# Patient Record
Sex: Female | Born: 1945
Health system: Southern US, Community
[De-identification: ages and names within clinical notes are randomized; demographics above are authoritative.]

## PROBLEM LIST (undated history)

## (undated) DIAGNOSIS — I499 Cardiac arrhythmia, unspecified: Secondary | ICD-10-CM

## (undated) DIAGNOSIS — E785 Hyperlipidemia, unspecified: Secondary | ICD-10-CM

## (undated) DIAGNOSIS — R9389 Abnormal findings on diagnostic imaging of other specified body structures: Secondary | ICD-10-CM

## (undated) DIAGNOSIS — I10 Essential (primary) hypertension: Secondary | ICD-10-CM

## (undated) DIAGNOSIS — Z87442 Personal history of urinary calculi: Secondary | ICD-10-CM

## (undated) DIAGNOSIS — K219 Gastro-esophageal reflux disease without esophagitis: Secondary | ICD-10-CM

## (undated) DIAGNOSIS — R112 Nausea with vomiting, unspecified: Secondary | ICD-10-CM

## (undated) DIAGNOSIS — Z9889 Other specified postprocedural states: Secondary | ICD-10-CM

## (undated) DIAGNOSIS — I4891 Unspecified atrial fibrillation: Secondary | ICD-10-CM

## (undated) DIAGNOSIS — J189 Pneumonia, unspecified organism: Secondary | ICD-10-CM

## (undated) DIAGNOSIS — N8500 Endometrial hyperplasia, unspecified: Secondary | ICD-10-CM

## (undated) HISTORY — DX: Hyperlipidemia, unspecified: E78.5

## (undated) HISTORY — DX: Unspecified atrial fibrillation: I48.91

## (undated) HISTORY — DX: Essential (primary) hypertension: I10

## (undated) HISTORY — PX: LAPAROSCOPY: SHX197

## (undated) HISTORY — PX: EYE SURGERY: SHX253

## (undated) HISTORY — PX: ANKLE FRACTURE SURGERY: SHX122

## (undated) HISTORY — PX: TONSILLECTOMY AND ADENOIDECTOMY: SHX28

---

## 2001-05-17 ENCOUNTER — Other Ambulatory Visit: Admission: RE | Admit: 2001-05-17 | Discharge: 2001-05-17 | Payer: Self-pay | Admitting: Obstetrics and Gynecology

## 2002-07-23 ENCOUNTER — Other Ambulatory Visit: Admission: RE | Admit: 2002-07-23 | Discharge: 2002-07-23 | Payer: Self-pay | Admitting: Gynecology

## 2003-07-19 ENCOUNTER — Other Ambulatory Visit: Admission: RE | Admit: 2003-07-19 | Discharge: 2003-07-19 | Payer: Self-pay | Admitting: Gynecology

## 2004-10-14 ENCOUNTER — Other Ambulatory Visit: Admission: RE | Admit: 2004-10-14 | Discharge: 2004-10-14 | Payer: Self-pay | Admitting: Gynecology

## 2005-03-28 ENCOUNTER — Emergency Department (HOSPITAL_COMMUNITY): Admission: EM | Admit: 2005-03-28 | Discharge: 2005-03-29 | Payer: Self-pay | Admitting: Emergency Medicine

## 2006-01-25 ENCOUNTER — Other Ambulatory Visit: Admission: RE | Admit: 2006-01-25 | Discharge: 2006-01-25 | Payer: Self-pay | Admitting: Gynecology

## 2007-02-22 ENCOUNTER — Other Ambulatory Visit: Admission: RE | Admit: 2007-02-22 | Discharge: 2007-02-22 | Payer: Self-pay | Admitting: Gynecology

## 2007-02-27 ENCOUNTER — Ambulatory Visit (HOSPITAL_COMMUNITY): Admission: RE | Admit: 2007-02-27 | Discharge: 2007-02-27 | Payer: Self-pay | Admitting: Gynecology

## 2007-03-30 ENCOUNTER — Encounter: Admission: RE | Admit: 2007-03-30 | Discharge: 2007-03-30 | Payer: Self-pay | Admitting: Family Medicine

## 2008-12-04 ENCOUNTER — Emergency Department (HOSPITAL_COMMUNITY): Admission: EM | Admit: 2008-12-04 | Discharge: 2008-12-04 | Payer: Self-pay | Admitting: Emergency Medicine

## 2010-07-30 ENCOUNTER — Encounter (INDEPENDENT_AMBULATORY_CARE_PROVIDER_SITE_OTHER): Payer: Self-pay | Admitting: *Deleted

## 2010-09-11 ENCOUNTER — Encounter (INDEPENDENT_AMBULATORY_CARE_PROVIDER_SITE_OTHER): Payer: Self-pay | Admitting: *Deleted

## 2010-09-11 ENCOUNTER — Ambulatory Visit: Payer: Self-pay | Admitting: Gastroenterology

## 2010-09-11 DIAGNOSIS — R197 Diarrhea, unspecified: Secondary | ICD-10-CM | POA: Insufficient documentation

## 2010-09-11 DIAGNOSIS — K219 Gastro-esophageal reflux disease without esophagitis: Secondary | ICD-10-CM | POA: Insufficient documentation

## 2010-09-11 DIAGNOSIS — K5900 Constipation, unspecified: Secondary | ICD-10-CM | POA: Insufficient documentation

## 2010-09-11 LAB — CONVERTED CEMR LAB: IgA: 479 mg/dL — ABNORMAL HIGH (ref 68–378)

## 2010-09-15 LAB — CONVERTED CEMR LAB: Tissue Transglutaminase Ab, IgA: 3.3 units (ref <20)

## 2010-11-01 HISTORY — PX: DILATION AND CURETTAGE OF UTERUS: SHX78

## 2010-11-09 ENCOUNTER — Ambulatory Visit
Admission: RE | Admit: 2010-11-09 | Discharge: 2010-11-09 | Payer: Self-pay | Source: Home / Self Care | Attending: Gastroenterology | Admitting: Gastroenterology

## 2010-11-09 ENCOUNTER — Encounter: Payer: Self-pay | Admitting: Gastroenterology

## 2010-12-03 NOTE — Assessment & Plan Note (Signed)
History of Present Illness Primary GI MD: Rob Bunting MD Primary Provider: Lesle Chris, MD Chief Complaint: After meals pt has a lot of epigastric pain, belching and bloating. Also alternating constipation and diarrhea.  History of Present Illness:      very pleasant 65 year old woman who has very noisy abdomen.  VEry loud.  No discomfort, just noisy. She does belch alot.  She has rare pryrosis.  upper abd symptoms worse with NSAIDs , bisphosphonates.    Sympotoms worse after evening meal.  Gets bloated.  Gas ex helps a slight bit.  Tried pepcid, no difference.  Overall has gained 10 pounds in a year.  Alternates constipation/diarrhea chronically.  SHe never sees blood in stool. She has done FOB testing, 2 years ago was negative.  Never had colonoscopy.  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           Current Medications (verified): 1)  Vivelle-Dot 0.05 Mg/24hr Pttw (Estradiol) .... As Directed Every 3 Days 2)  Prometrium 200 Mg Caps (Progesterone Micronized) .... One Tablet By Mouth Every 3 Months For 12 Days  Allergies (verified): No Known Drug Allergies  Past History:  Past Medical History: Arthritis Elevated cholesterol  Past Surgical History: none  Family History: daughter has celiac sprue  Social History: she is widowed, she has 2 daughters, she works at urgent care as a Solicitor, she does not smoke cigarettes, she does not drink alcohol, she drinks 2-3 caffeinated beverages a day  Review of Systems       Pertinent positive and negative review of systems were noted in the above HPI and GI specific review of systems.  All other review of systems was otherwise negative.   Vital Signs:  Patient profile:   65 year old female Height:      64 inches Weight:      145.50 pounds BMI:     25.07 Pulse rate:   88 / minute Pulse rhythm:   regular BP sitting:   144 / 72  (left arm) Cuff size:   regular  Vitals Entered By: Christie Nottingham CMA Duncan Dull) (September 11, 2010 10:08  AM)  Physical Exam  Additional Exam:  Constitutional: generally well appearing Psychiatric: alert and oriented times 3 Eyes: extraocular movements intact Mouth: oropharynx moist, no lesions Neck: supple, no lymphadenopathy Cardiovascular: heart regular rate and rythm Lungs: CTA bilaterally Abdomen: soft, non-tender, non-distended, no obvious ascites, no peritoneal signs, normal bowel sounds Extremities: no lower extremity edema bilaterally Skin: no lesions on visible extremities    Impression & Recommendations:  Problem # 1:  Alternating constipation, diarrhea likely functional. She has never had colonoscopy for routine cancer screening and we will set that up for her at her soonest convenience.  Perhaps some of these symptoms are sprue related  Problem # 2:  family history celiac sprue blood testing today and if positive would recommend EGD  Problem # 3:  belching, dyspepsia trial of proton pump inhibitor once nightly. She will call with a response in 4-5 weeks.  Other Orders: TLB-IgA (Immunoglobulin A) (82784-IGA) T-Sprue Panel (Celiac Disease Aby Eval) (83516x3/[address]) Colonoscopy (Colon)  Patient Instructions: 1)  You will be scheduled to have a colonoscopy. 2)  You will get lab test(s) done today (celiac panel, total IgA).  would recommend EGD, biopsy if positive. 3)  Samples of PPI given, take one pill 20-30 min prior to dinner meal. 4)  Call Dr. Christella Hartigan' office in 4-5 weeks to report on response to medicine.   5)  A  copy of this information will be sent to Dr. Cleta Alberts. 6)  The medication list was reviewed and reconciled.  All changed / newly prescribed medications were explained.  A complete medication list was provided to the patient / caregiver. Prescriptions: MOVIPREP 100 GM  SOLR (PEG-KCL-NACL-NASULF-NA ASC-C) As per prep instructions.  #1 x 0   Entered by:   Chales Abrahams CMA (AAMA)   Authorized by:   Rachael Fee MD   Signed by:   Chales Abrahams CMA (AAMA) on  09/11/2010   Method used:   Electronically to        [facility] (retail)       [address]       [place], [place]  [address]       Ph: [phone] or [phone]       Fax: [phone]   RxID:   [id number]

## 2010-12-03 NOTE — Letter (Signed)
Summary: New Patient letter  [facility]  [address] [place], [place] [address]   Phone: [phone]  Fax: [phone]       07/30/2010 MRN: [id number]  Mary Potter [address] [place], [place]  [address]  Dear Ms. Mary Potter,  Welcome to the Gastroenterology Division at [facility].    You are scheduled to see Dr.  Christella Potter on 09-11-10 at 10:00a.m. on the 3rd floor at [facility], [address]  We ask that you try to arrive at our office 15 minutes prior to your appointment time to allow for check-in.  We would like you to complete the enclosed self-administered evaluation form prior to your visit and bring it with you on the day of your appointment.  We will review it with you.  Also, please bring a complete list of all your medications or, if you prefer, bring the medication bottles and we will list them.  Please bring your insurance card so that we may make a copy of it.  If your insurance requires a referral to see a specialist, please bring your referral form from your primary care physician.  Co-payments are due at the time of your visit and may be paid by cash, check or credit card.     Your office visit will consist of a consult with your physician (includes a physical exam), any laboratory testing he/she may order, scheduling of any necessary diagnostic testing (e.g. x-ray, ultrasound, CT-scan), and scheduling of a procedure (e.g. Endoscopy, Colonoscopy) if required.  Please allow enough time on your schedule to allow for any/all of these possibilities.    If you cannot keep your appointment, please call [phone] to cancel or reschedule prior to your appointment date.  This allows [place] the opportunity to schedule an appointment for another patient in need of care.  If you do not cancel or reschedule by 5 p.m. the business day prior to your appointment date, you will be charged a $50.00 late cancellation/no-show fee.    Thank you for choosing  [facility] for your medical needs.  We appreciate the opportunity to care for you.  Please visit [place] at our website  to learn more about our practice.                     Sincerely,                                                             The Gastroenterology Division

## 2010-12-03 NOTE — Letter (Signed)
Summary: [facility] Instructions  [facility]  [address] [place], [place] [address]   Phone: [phone]  Fax: [phone]       Mary Potter    [birth year]    MRN: [id number]        Procedure Day /Date: 11/09/10 MON     Arrival Time:10 am     Procedure Time:11 am     Location of Procedure:                    X  [facility] (4th Floor)                        PREPARATION FOR COLONOSCOPY WITH MOVIPREP   Starting 5 days prior to your procedure 11/04/10  do not eat nuts, seeds, popcorn, corn, beans, peas,  salads, or any raw vegetables.  Do not take any fiber supplements (e.g. Metamucil, Citrucel, and Benefiber).  THE DAY BEFORE YOUR PROCEDURE         DATE: 11/08/10  DAY: SUN  1.  Drink clear liquids the entire day-NO SOLID FOOD  2.  Do not drink anything colored red or purple.  Avoid juices with pulp.  No orange juice.  3.  Drink at least 64 oz. (8 glasses) of fluid/clear liquids during the day to prevent dehydration and help the prep work efficiently.  CLEAR LIQUIDS INCLUDE: Water Jello Ice Popsicles Tea (sugar ok, no milk/cream) Powdered fruit flavored drinks Coffee (sugar ok, no milk/cream) Gatorade Juice: apple, white grape, white cranberry  Lemonade Clear bullion, consomm, broth Carbonated beverages (any kind) Strained chicken noodle soup Hard Candy                             4.  In the morning, mix first dose of MoviPrep solution:    Empty 1 Pouch A and 1 Pouch B into the disposable container    Add lukewarm drinking water to the top line of the container. Mix to dissolve    Refrigerate (mixed solution should be used within 24 hrs)  5.  Begin drinking the prep at 5:00 p.m. The MoviPrep container is divided by 4 marks.   Every 15 minutes drink the solution down to the next mark (approximately 8 oz) until the full liter is complete.   6.  Follow completed prep with 16 oz of clear liquid of your choice (Nothing red or purple).   Continue to drink clear liquids until bedtime.  7.  Before going to bed, mix second dose of MoviPrep solution:    Empty 1 Pouch A and 1 Pouch B into the disposable container    Add lukewarm drinking water to the top line of the container. Mix to dissolve    Refrigerate  THE DAY OF YOUR PROCEDURE      DATE:11/09/10  DAY: MON  Beginning at 6 a.m. (5 hours before procedure):         1. Every 15 minutes, drink the solution down to the next mark (approx 8 oz) until the full liter is complete.  2. Follow completed prep with 16 oz. of clear liquid of your choice.    3. You may drink clear liquids until 9 am (2 HOURS BEFORE PROCEDURE).   MEDICATION INSTRUCTIONS  Unless otherwise instructed, you should take regular prescription medications with a small sip of water   as early as possible the morning of your procedure.  OTHER INSTRUCTIONS  You will need a responsible adult at least 64 years of age to accompany you and drive you home.   This person must remain in the waiting room during your procedure.  Wear loose fitting clothing that is easily removed.  Leave jewelry and other valuables at home.  However, you may wish to bring a book to read or  an iPod/MP3 player to listen to music as you wait for your procedure to start.  Remove all body piercing jewelry and leave at home.  Total time from sign-in until discharge is approximately 2-3 hours.  You should go home directly after your procedure and rest.  You can resume normal activities the  day after your procedure.  The day of your procedure you should not:   Drive   Make legal decisions   Operate machinery   Drink alcohol   Return to work  You will receive specific instructions about eating, activities and medications before you leave.    The above instructions have been reviewed and explained to me by   _______________________    I fully understand and can verbalize these instructions  _____________________________ Date _________

## 2010-12-03 NOTE — Procedures (Signed)
Summary: Colonoscopy  Patient: Mary Potter Note: All result statuses are Final unless otherwise noted.  Tests: (1) Colonoscopy (COL)   COL Colonoscopy           DONE     [facility]     [address]     [place], [place]  [address]           COLONOSCOPY PROCEDURE REPORT           PATIENT:  Mary Potter, Mary Potter  MR#:  [id number]     BIRTHDATE:  October 19, 1946, 64 yrs. old  GENDER:  female     ENDOSCOPIST:  Rachael Fee, MD     REF. BY:  Lesle Chris, M.D.     PROCEDURE DATE:  11/09/2010     PROCEDURE:  Diagnostic Colonoscopy     ASA CLASS:  Class II     INDICATIONS:  dyspepsia, abd discomforts, alternating     constipation/diarrhea     MEDICATIONS:   Fentanyl 50 mcg IV, Versed 7 mg IV           DESCRIPTION OF PROCEDURE:   After the risks benefits and     alternatives of the procedure were thoroughly explained, informed     consent was obtained.  Digital rectal exam was performed and     revealed no rectal masses.   The LB [id number]-[id number] endoscope     was introduced through the anus and advanced to the cecum, which     was identified by both the appendix and ileocecal valve, without     limitations.  The quality of the prep was good, using MoviPrep.     The instrument was then slowly withdrawn as the colon was fully     examined.     <<PROCEDUREIMAGES>>     FINDINGS:  Mild diverticulosis was found in the sigmoid to     descending colon segments (see image3).  This was otherwise a     normal examination of the colon (see image1, image2, and image5).     Retroflexed views in the rectum revealed no abnormalities.    The     scope was then withdrawn from the patient and the procedure     completed.     COMPLICATIONS:  None           ENDOSCOPIC IMPRESSION:     1) Mild diverticulosis in the sigmoid to descending colon     segments     2) Otherwise normal examination; no cancers or polyps           RECOMMENDATIONS:     1) You should continue to follow colorectal  cancer screening     guidelines for "routine risk" patients with a repeat colonoscopy     in 10 years. There is no need for FOBT (stool) testing for at     least 5 years.     2) You should take 2 gas-ex pills with every meal           ______________________________     Rachael Fee, MD           n.     eSIGNED:   Rachael Fee at 11/09/2010 11:33 AM           Burnard Leigh, [phone]  Note: An exclamation mark (!) indicates a result that was not dispersed into the flowsheet. Document Creation Date: 11/09/2010 11:33 AM _______________________________________________________________________  (1) Order result status: Final Collection or observation date-time:  11/09/2010 11:28 Requested date-time:  Receipt date-time:  Reported date-time:  Referring Physician:   Ordering Physician: Rob Bunting [phone]) Specimen Source:  Source: Launa Grill Order Number: [id number] Lab site:   Appended Document: Colonoscopy    Clinical Lists Changes  Observations: Added new observation of COLONNXTDUE: 11/2020 (11/09/2010 11:37)

## 2011-05-11 ENCOUNTER — Ambulatory Visit (HOSPITAL_BASED_OUTPATIENT_CLINIC_OR_DEPARTMENT_OTHER)
Admission: RE | Admit: 2011-05-11 | Discharge: 2011-05-11 | Disposition: A | Discharge status: Home / Self Care | Payer: 59 | Source: Ambulatory Visit | Attending: Gynecology | Admitting: Gynecology

## 2011-05-11 ENCOUNTER — Other Ambulatory Visit: Payer: Self-pay | Admitting: Gynecology

## 2011-05-11 DIAGNOSIS — D280 Benign neoplasm of vulva: Secondary | ICD-10-CM | POA: Insufficient documentation

## 2011-05-11 DIAGNOSIS — N840 Polyp of corpus uteri: Secondary | ICD-10-CM | POA: Insufficient documentation

## 2011-05-11 DIAGNOSIS — N950 Postmenopausal bleeding: Secondary | ICD-10-CM | POA: Insufficient documentation

## 2011-05-11 DIAGNOSIS — I4510 Unspecified right bundle-branch block: Secondary | ICD-10-CM | POA: Insufficient documentation

## 2011-05-11 DIAGNOSIS — Z01812 Encounter for preprocedural laboratory examination: Secondary | ICD-10-CM | POA: Insufficient documentation

## 2011-05-11 DIAGNOSIS — Z01810 Encounter for preprocedural cardiovascular examination: Secondary | ICD-10-CM | POA: Insufficient documentation

## 2011-05-11 LAB — POCT HEMOGLOBIN-HEMACUE: Hemoglobin: 13.6 g/dL (ref 12.0–15.0)

## 2011-06-10 NOTE — Op Note (Signed)
  Mary Potter, Mary Potter             ACCOUNT NO.:  [id number]  MEDICAL RECORD NO.:  [id number]  LOCATION:                                 FACILITY:  PHYSICIAN:  Gretta Cool, M.D. DATE OF BIRTH:  1946-07-20  DATE OF PROCEDURE:  05/11/2011 DATE OF DISCHARGE:                              OPERATIVE REPORT   PREOPERATIVE DIAGNOSES: 1. Endometrial polyps with postmenopausal bleeding. 2. Atypical nevus, right vulva.  POSTOPERATIVE DIAGNOSES: 1. Endometrial polyps with postmenopausal bleeding. 2. Atypical nevus, right vulva.  PROCEDURE: 1. Hysteroscopy resection of the multiple endometrial polyps, fundal     benign in appearance. 2. Full-thickness excision of atypical nevus, right vulva.  SURGEON:  Gretta Cool, MD  ANESTHESIA:  IV sedation and paracervical block.  DESCRIPTION OF PROCEDURE:  Under excellent anesthesia as above with the patient prepped and draped in Allen stirrups, her bladder was drained. Weighted speculum was then placed in the vagina and paracervical block applied.  The cervix was then progressively dilated with series of Pratt dilators to 33.  The endometrial cavity was then examined and photographed.  Multiple polyps were noted in the fundus of the uterus. The polyps were progressively resected down 5 mm or so into the myometrium.  The corneal areas where they appeared to arise were resected until no further endometrial tissue was identified.  At this point, the bleeding was well-controlled at reduced pressure.  Toradol was given intraoperatively.  At this point, attention was turned to the vulvar nevus.  There was a slightly raised atypical looking nevus with irregular border and variation in color.  Because of its position and atypical appearance and fairly large size, it was excised full-thickness with a narrow margin and around the closest margins, an elliptical incision was made and closed with 2 interrupted sutures of 3-0 black silk.  At this  point, procedure was terminated without complication.  Patient returned to recovery room in excellent condition.         ______________________________ Gretta Cool, M.D.    CWL/MEDQ  D:  05/11/2011  T:  05/11/2011  Job:  [id number]  Electronically Signed by Beather Arbour M.D. on 06/10/2011 09:12:10 AM

## 2011-11-12 ENCOUNTER — Other Ambulatory Visit: Payer: Self-pay | Admitting: Gynecology

## 2012-02-16 DIAGNOSIS — L819 Disorder of pigmentation, unspecified: Secondary | ICD-10-CM | POA: Diagnosis not present

## 2012-02-16 DIAGNOSIS — L821 Other seborrheic keratosis: Secondary | ICD-10-CM | POA: Diagnosis not present

## 2012-04-07 ENCOUNTER — Other Ambulatory Visit: Payer: Self-pay | Admitting: Gynecology

## 2012-04-07 DIAGNOSIS — R7989 Other specified abnormal findings of blood chemistry: Secondary | ICD-10-CM | POA: Diagnosis not present

## 2012-04-07 DIAGNOSIS — M949 Disorder of cartilage, unspecified: Secondary | ICD-10-CM | POA: Diagnosis not present

## 2012-04-07 DIAGNOSIS — M899 Disorder of bone, unspecified: Secondary | ICD-10-CM | POA: Diagnosis not present

## 2012-04-07 DIAGNOSIS — I10 Essential (primary) hypertension: Secondary | ICD-10-CM | POA: Diagnosis not present

## 2012-04-07 DIAGNOSIS — E7800 Pure hypercholesterolemia, unspecified: Secondary | ICD-10-CM | POA: Diagnosis not present

## 2012-05-24 ENCOUNTER — Encounter: Payer: Self-pay | Admitting: Emergency Medicine

## 2012-11-14 ENCOUNTER — Ambulatory Visit (INDEPENDENT_AMBULATORY_CARE_PROVIDER_SITE_OTHER): Payer: 59 | Admitting: Internal Medicine

## 2012-11-14 VITALS — BP 150/70 | HR 97 | Temp 97.7°F | Resp 16 | Ht 64.5 in | Wt 137.0 lb

## 2012-11-14 DIAGNOSIS — H5710 Ocular pain, unspecified eye: Secondary | ICD-10-CM

## 2012-11-14 DIAGNOSIS — S00251A Superficial foreign body of right eyelid and periocular area, initial encounter: Secondary | ICD-10-CM

## 2012-11-14 DIAGNOSIS — E789 Disorder of lipoprotein metabolism, unspecified: Secondary | ICD-10-CM | POA: Insufficient documentation

## 2012-11-14 DIAGNOSIS — T1510XA Foreign body in conjunctival sac, unspecified eye, initial encounter: Secondary | ICD-10-CM

## 2012-11-14 DIAGNOSIS — H5711 Ocular pain, right eye: Secondary | ICD-10-CM

## 2012-11-14 NOTE — Patient Instructions (Signed)
Eye, Foreign Body The term foreign body refers to any object near, on the surface of or in the eye that should not be there. A foreign body may be a small speck of dirt or dust, a hair or eyelash, a splinter or any object. CAUSES  Foreign bodies can get in the eye by:  Flying pieces of something that was broken or destroyed (debris).  A sudden injury (trauma) to the eye. SYMPTOMS  Symptoms depend on what the foreign body is and where it is in the eye. The most common locations are:  On the inner surface of the upper or lower eyelids or on the covering of the white part of the eye (conjunctiva). Symptoms in this location are:  Irritating and painful, especially when blinking.  Feeling like something is in the eye.  On the surface of the clear covering on the front of the eye (cornea). A corneal foreign body has symptoms that:  Are painful and irritating since the cornea is very sensitive.  Form small "rust rings" around a metallic foreign body. Metallic foreign bodies stick more firmly to the surface of the cornea.  Inside the eyeball. Infection can happen fast and can be hard to treat with antibiotics. This is an extremely dangerous situation. Foreign bodies inside the eye can threaten vision. A person may even loose their eye. Foreign bodies inside the eye may cause:  Great pain.  Immediate loss of vision. DIAGNOSIS  Foreign bodies are found during an exam by an eye specialist. Those that are on the eyelids, conjunctiva or cornea are usually (but not always) easily found. When a foreign body is inside the eyeball, a cataract may form almost right away. This makes it hard for an ophthalmologist to find the foreign body. Special tests may be needed, including ultrasound testing, X-rays and CT scans. TREATMENT   Foreign bodies that are on the eyelids, conjunctiva or cornea are often removed easily and painlessly.  If the foreign body has caused a scratch or abrasion of the cornea,  antibiotic drops, ointments and/or a tight patch called a "pressure patch" may be needed. Follow-up exams will be needed for several days until the abrasion heals.  Surgery is needed right away if the foreign body is inside the eyeball. This is a medical emergency. An antibiotic therapy will likely be given to stop an infection. HOME CARE INSTRUCTIONS  The use of eye patches is not universal. Their use varies from state to state and from caregiver to caregiver. If an eye patch was applied:  Keep the eye patch on for as long as directed by your caregiver until the follow-up appointment.  Do not remove the patch to put in medications unless instructed to do so. When replacing the patch, retape it as it was before. Follow the same procedure if the patch becomes loose.  WARNING: Do not drive or operate machinery while the eye is patched. The ability to judge distances will be impaired.  Only take over-the-counter or prescription medicines for pain, discomfort or fever as directed by the caregiver. If no eye patch was applied:  Keep the eye closed as much as possible. Do not rub the eye.  Wear dark glasses as needed to protect the eyes from bright light.  Do not wear contact lenses until the eye feels normal again, or as instructed.  Wear protective eye covering if there is a risk of eye injury. This is important when working with high speed tools.  Only take over-the-counter or   feels normal again, or as instructed.   Wear protective eye covering if there is a risk of eye injury. This is important when working with high speed tools.   Only take over-the-counter or prescription medicines for pain, discomfort or fever as directed by the caregiver.  SEEK IMMEDIATE MEDICAL CARE IF:    Pain increases in the eye or the vision changes.   You or your child has problems with the eye patch.   The injury to the eye appears to be getting larger.   There is discharge from the injured eye.   Swelling and/or soreness (inflammation) develops around the affected eye.   You or your child has an oral temperature above 102 F (38.9 C), not controlled by medicine.   Your baby is older than 3  months with a rectal temperature of 102 F (38.9 C) or higher.   Your baby is 3 months old or younger with a rectal temperature of 100.4 F (38 C) or higher.  MAKE SURE YOU:    Understand these instructions.   Will watch your condition.   Will get help right away if you are not doing well or get worse.  Document Released: 10/18/2005 Document Revised: 01/10/2012 Document Reviewed: 06/06/2008  Exit[employer] Patient Information 2013 [employer].

## 2012-11-14 NOTE — Progress Notes (Signed)
  Subjective:    Patient ID: Mary Potter, female    DOB: 10/18/1946, 67 y.o.   MRN: [id number]  HPI Putting on make up and something fell into the eye, right. Feels something, did not poke anything into eye.   Review of Systems     Objective:   Physical Exam  Constitutional: She is oriented to person, place, and time. She appears well-developed and well-nourished.  Eyes: Conjunctivae normal and EOM are normal. Pupils are equal, round, and reactive to light. Right eye exhibits no discharge. Foreign body present in the right eye. Left eye exhibits no discharge. No foreign body present in the left eye. Right conjunctiva is not injected. Right conjunctiva has no hemorrhage.       No corneal uptake  Neurological: She is alert and oriented to person, place, and time. She exhibits normal muscle tone. Coordination normal.  Psychiatric: She has a normal mood and affect.          Assessment & Plan:  FBs removed from conjunctiva

## 2013-06-22 ENCOUNTER — Telehealth: Payer: Self-pay

## 2013-06-22 NOTE — Telephone Encounter (Signed)
I do not see any drops listed in her chart.  Could try OTC allergy eye drop.  Otherwise would recommend pt come in and be seen

## 2013-06-22 NOTE — Telephone Encounter (Signed)
Mary Potter called and stated that she woke up this morning with red eyes and swollen stuck together lids. She reported that she started using a new make-up and this allergic reaction often happens when she changes any make-up products. Maralyn Sago had called in some drops for her the last time this happened and she is requesting again if possible. I could not find a record of the type of drops that were called in last time.

## 2013-06-25 NOTE — Telephone Encounter (Signed)
Patient advised.

## 2013-11-15 DIAGNOSIS — Z01419 Encounter for gynecological examination (general) (routine) without abnormal findings: Secondary | ICD-10-CM | POA: Diagnosis not present

## 2013-11-15 DIAGNOSIS — N951 Menopausal and female climacteric states: Secondary | ICD-10-CM | POA: Diagnosis not present

## 2013-11-15 DIAGNOSIS — Z0000 Encounter for general adult medical examination without abnormal findings: Secondary | ICD-10-CM | POA: Diagnosis not present

## 2013-11-15 DIAGNOSIS — M810 Age-related osteoporosis without current pathological fracture: Secondary | ICD-10-CM | POA: Diagnosis not present

## 2013-11-15 DIAGNOSIS — Z79890 Hormone replacement therapy (postmenopausal): Secondary | ICD-10-CM | POA: Diagnosis not present

## 2013-12-04 DIAGNOSIS — Z1231 Encounter for screening mammogram for malignant neoplasm of breast: Secondary | ICD-10-CM | POA: Diagnosis not present

## 2013-12-04 DIAGNOSIS — R928 Other abnormal and inconclusive findings on diagnostic imaging of breast: Secondary | ICD-10-CM | POA: Diagnosis not present

## 2013-12-11 DIAGNOSIS — R928 Other abnormal and inconclusive findings on diagnostic imaging of breast: Secondary | ICD-10-CM | POA: Diagnosis not present

## 2013-12-11 DIAGNOSIS — Z1231 Encounter for screening mammogram for malignant neoplasm of breast: Secondary | ICD-10-CM | POA: Diagnosis not present

## 2014-01-08 ENCOUNTER — Ambulatory Visit (INDEPENDENT_AMBULATORY_CARE_PROVIDER_SITE_OTHER): Payer: 59 | Admitting: Internal Medicine

## 2014-01-08 ENCOUNTER — Ambulatory Visit: Payer: 59

## 2014-01-08 VITALS — BP 166/80 | HR 76 | Temp 98.4°F | Resp 16 | Ht 63.0 in | Wt 139.2 lb

## 2014-01-08 DIAGNOSIS — M79673 Pain in unspecified foot: Secondary | ICD-10-CM

## 2014-01-08 DIAGNOSIS — M79609 Pain in unspecified limb: Secondary | ICD-10-CM

## 2014-01-08 NOTE — Progress Notes (Signed)
   Subjective:    Patient ID: Mary Potter, female    DOB: 10-07-1946, 68 y.o.   MRN: [id number]  HPI    Review of Systems     Objective:   Physical Exam        Assessment & Plan:

## 2014-01-08 NOTE — Progress Notes (Signed)
   Subjective:    Patient ID: Mary Potter, female    DOB: Jul 08, 1946, 68 y.o.   MRN: [id number]  HPI 68 year old female present for left foot pain. Pt has had it for one week. The pain is dorsal and right around the toes. No known injury. She had the same issue previously; and they thought it may have been related to a tendon.  Has past hx of stress fxs, no proven hx of gout. 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 Foot orthopedist wants to replace her right ankle joint in future.  Review of Systems     Objective:   Physical Exam  Constitutional: She appears well-developed and well-nourished. No distress.  HENT:  Head: Normocephalic.  Eyes: EOM are normal.  Pulmonary/Chest: Effort normal.  Musculoskeletal:       Left foot: She exhibits tenderness, bony tenderness and swelling. She exhibits normal range of motion, normal capillary refill, no crepitus, no deformity and no laceration.       Feet:  Resolving eccymosis  Neurological: She has normal strength. No sensory deficit. Gait abnormal.   [facility] reading (PRIMARY) by  Dr Elder Cyphers 4th distal MT has callus   BP 140/80      Assessment & Plan:  Left distal foot pain/Probable stress fx healing Camwalker

## 2014-01-09 LAB — COMPREHENSIVE METABOLIC PANEL
ALT: 20 U/L (ref 0–35)
AST: 21 U/L (ref 0–37)
Albumin: 4.5 g/dL (ref 3.5–5.2)
Alkaline Phosphatase: 41 U/L (ref 39–117)
BUN: 17 mg/dL (ref 6–23)
CO2: 25 mEq/L (ref 19–32)
Calcium: 9 mg/dL (ref 8.4–10.5)
Chloride: 103 mEq/L (ref 96–112)
Creat: 0.66 mg/dL (ref 0.50–1.10)
Glucose, Bld: 91 mg/dL (ref 70–99)
Potassium: 4.3 mEq/L (ref 3.5–5.3)
Sodium: 137 mEq/L (ref 135–145)
Total Bilirubin: 0.5 mg/dL (ref 0.2–1.2)
Total Protein: 6.8 g/dL (ref 6.0–8.3)

## 2014-01-09 LAB — URIC ACID: Uric Acid, Serum: 3.9 mg/dL (ref 2.4–7.0)

## 2014-04-17 ENCOUNTER — Other Ambulatory Visit (INDEPENDENT_AMBULATORY_CARE_PROVIDER_SITE_OTHER): Payer: 59

## 2014-04-17 ENCOUNTER — Encounter: Payer: Self-pay | Admitting: Family Medicine

## 2014-04-17 ENCOUNTER — Ambulatory Visit (INDEPENDENT_AMBULATORY_CARE_PROVIDER_SITE_OTHER): Payer: 59 | Admitting: Family Medicine

## 2014-04-17 VITALS — BP 136/84 | HR 93 | Ht 63.5 in | Wt 140.0 lb

## 2014-04-17 DIAGNOSIS — M79605 Pain in left leg: Secondary | ICD-10-CM

## 2014-04-17 DIAGNOSIS — M79609 Pain in unspecified limb: Secondary | ICD-10-CM | POA: Diagnosis not present

## 2014-04-17 LAB — VITAMIN D 25 HYDROXY (VIT D DEFICIENCY, FRACTURES): VITD: 51.93 ng/mL

## 2014-04-17 LAB — CK: Total CK: 35 U/L (ref 7–177)

## 2014-04-17 LAB — FERRITIN: Ferritin: 26.2 ng/mL (ref 10.0–291.0)

## 2014-04-17 MED ORDER — GABAPENTIN 100 MG PO CAPS: 100.0000 mg | ORAL_CAPSULE | Freq: Three times a day (TID) | ORAL | Status: DC

## 2014-04-17 NOTE — Assessment & Plan Note (Signed)
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 Patient on ultrasound and physical exam today did not show any signs of cast irritation. We decided that we are going to start gabapentin at a low dose and gave patient some exercise program. We also talked about manual massage to try to help with the potential for piriformis syndrome causing an L5 nerve root compression. Patient has a negative straight leg test and is in good shape so I doubt that patient's low back is contributing but if it continues I would consider x-rays of the lumbar spine.

## 2014-04-17 NOTE — Progress Notes (Signed)
[facility] [place], [place] [address] Phone: [phone] Subjective:    CC: left leg pain  ZWC:HENIDPOEUM Mary Potter is a 68 y.o. female coming in with complaint of left leg pain. Patient states she's had this pain for quite some time. 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  Even had significant workup including MRI without any specific findings. Patient states this one is somewhat similar but a little bit different. Patient describes it is more of a dull aching sensation that can have a burning sensation on the lateral aspect of her leg starting in her knee and going distally. Patient states it seems to be actually worse with sitting or even lying down. Patient states that the pain at is worse even at night. Patient states it wakes her up about 3-4 times a night. Patient denies any fevers or chills or any abnormal weight loss. Patient states that she has tried different things including stopping her cholesterol medication for a week which did seem to make some mild improvement but is back on the medication. Patient states that the severity is 8/10. Patient has been taking ibuprofen with moderate benefit as well.     Past medical history, social, surgical and family history all reviewed in electronic medical record.   Review of Systems: No headache, visual changes, nausea, vomiting, diarrhea, constipation, dizziness, abdominal pain, skin rash, fevers, chills, night sweats, weight loss, swollen lymph nodes, body aches, joint swelling, muscle aches, chest pain, shortness of breath, mood changes.   Objective Blood pressure 136/84, pulse 93, height 5' 3.5" (1.613 m), weight 140 lb (63.504 kg), SpO2 98.00%.  General: No apparent distress alert and oriented x3 mood and affect normal, dressed  appropriately.  HEENT: Pupils equal, extraocular movements intact  Respiratory: Patient's speak in full sentences and does not appear short of breath  Cardiovascular: No lower extremity edema, non tender, no erythema  Skin: Warm dry intact with no signs of infection or rash on extremities or on axial skeleton.  Abdomen: Soft nontender  Neuro: Cranial nerves II through XII are intact, neurovascularly intact in all extremities with 2+ DTRs and 2+ pulses.  Lymph: No lymphadenopathy of posterior or anterior cervical chain or axillae bilaterally.  Gait normal with good balance and coordination.  MSK:  Non tender with full range of motion and good stability and symmetric strength and tone of shoulders, elbows, wrist, hip,  and ankles bilaterally.  Back Exam:  Inspection: Unremarkable  Motion: Flexion 45 deg, Extension 45 deg, Side Bending to 45 deg bilaterally,  Rotation to 45 deg bilaterally  SLR laying: Negative  XSLR laying: Negative  Palpable tenderness: Mild tenderness over the greater trochanteric bursa as well as the performance muscle to FABER: Mild positive on left Sensory change: Gross sensation intact to all lumbar and sacral dermatomes.  Reflexes: 2+ at both patellar tendons, 2+ at achilles tendons, Babinski's downgoing.  Strength at foot  Plantar-flexion: 5/5 Dorsi-flexion: 5/5 Eversion: 5/5 Inversion: 5/5  Leg strength  Quad: 5/5 Hamstring: 5/5 Hip flexor: 5/5 Hip abductors: 5/5  Gait unremarkable.  Knee: Left Normal to inspection with no erythema or effusion or obvious bony abnormalities. Palpation normal with no warmth, joint line tenderness, patellar tenderness, or condyle tenderness. ROM full in flexion and extension and lower leg rotation. Ligaments with solid consistent endpoints  including ACL, PCL, LCL, MCL. Negative Mcmurray's, Apley's, and Thessalonian tests. Non painful patellar compression. Patellar glide without crepitus. Patellar and quadriceps tendons  unremarkable. Hamstring and quadriceps strength is normal.  Contralateral knee unremarkable  MSK US performed of: Left knee This study was ordered, performed, and interpreted by Charlann Boxer D.O.  Knee: All structures visualized. Anteromedial, anterolateral, posteromedial, and posterolateral menisci unremarkable without tearing, fraying, effusion, or displacement. Patellar Tendon unremarkable on long and transverse views without effusion. No abnormality of prepatellar bursa. LCL and MCL unremarkable on long and transverse views. No abnormality of origin of medial or lateral head of the gastrocnemius.  IMPRESSION:  NORMAL ULTRASONOGRAPHIC EXAMINATION OF THE KNEE.    Impression and Recommendations:     This case required medical decision making of moderate complexity.

## 2014-04-17 NOTE — Patient Instructions (Signed)
Very nice to meet you Exercises most days of the week.  Gabapentin at night  Start at 100mg  for the first 5 night and then go to 200mg  for 5 nights then 300mg  thereafter.  Tennis ball back left pocket with sitting.  Get labs drawn today. I will send you the results Iron 325mg  three times a week may help Come back in 3 weeks.

## 2014-05-09 ENCOUNTER — Other Ambulatory Visit (INDEPENDENT_AMBULATORY_CARE_PROVIDER_SITE_OTHER): Payer: 59

## 2014-05-09 ENCOUNTER — Encounter: Payer: Self-pay | Admitting: Family Medicine

## 2014-05-09 ENCOUNTER — Ambulatory Visit (INDEPENDENT_AMBULATORY_CARE_PROVIDER_SITE_OTHER): Payer: 59 | Admitting: Family Medicine

## 2014-05-09 VITALS — BP 126/82 | HR 72 | Ht 63.5 in | Wt 141.0 lb

## 2014-05-09 DIAGNOSIS — M25579 Pain in unspecified ankle and joints of unspecified foot: Secondary | ICD-10-CM

## 2014-05-09 DIAGNOSIS — M25571 Pain in right ankle and joints of right foot: Secondary | ICD-10-CM

## 2014-05-09 DIAGNOSIS — M79605 Pain in left leg: Secondary | ICD-10-CM

## 2014-05-09 DIAGNOSIS — M79609 Pain in unspecified limb: Secondary | ICD-10-CM

## 2014-05-09 DIAGNOSIS — M76829 Posterior tibial tendinitis, unspecified leg: Secondary | ICD-10-CM

## 2014-05-09 DIAGNOSIS — M76821 Posterior tibial tendinitis, right leg: Secondary | ICD-10-CM

## 2014-05-09 DIAGNOSIS — Z981 Arthrodesis status: Secondary | ICD-10-CM | POA: Insufficient documentation

## 2014-05-09 NOTE — Patient Instructions (Signed)
Good to see you Ice bath 20 minutes at end of long day Exercises 3 times a week Pennsaid twice daily until pain is gone.  COnitnue the vitamin D  Heel lift in shoe at all time.  Come back in 3 weeks if not better.  Posterior Tibial Tendon Tendinitis with Rehab Tendonitis is a condition that is characterized by inflammation of a tendon or the lining (sheath) that surrounds it. The inflammation is usually caused by damage to the tendon, such as a tendon tear (strain). Sprains are classified into three categories. Grade 1 sprains cause pain, but the tendon is not lengthened. Grade 2 sprains include a lengthened ligament due to the ligament being stretched or partially ruptured. With grade 2 sprains there is still function, although the function may be diminished. Grade 3 sprains are characterized by a complete tear of the tendon or muscle, and function is usually impaired. Posterior tibialis tendonitis is tendonitis of the posterior tibial tendon, which attaches muscles of the lower leg to the foot. The posterior tibial tendon is located in the back of the ankle and helps the body straighten (plantarflex) and rotate inward (medially rotate) the ankle. SYMPTOMS   Pain, tenderness, swelling, warmth, and/or redness over the back of the inner ankle at the posterior tibial tendon or the inner part of the mid-foot.  Pain that worsens with plantarflexion or medial rotation of the ankle.  A crackling sound (crepitation) when the tendon is moved or touched. CAUSES  Posterior tibial tendonitis occurs when damage to the posterior tibial tendon starts an inflammatory response. Common mechanisms of injury include:  Degenerative (occurs with aging) processes that weaken the tendon and make it more susceptible to injury.  Stress placed on the tendon from an increase in the intensity, frequency, or duration of training.  Direct trauma to the ankle.  Returning to activity before a previous ankle injury is  allowed to heal. RISK INCREASES WITH:  Activities that involve repetitive and/or stressful plantarflexion (jumping, kicking, or running up/down hills).  Poor strength and flexibility.  Flat feet.  Previous injury to the foot, ankle, or leg. PREVENTION   Warm up and stretch properly before activity.  Allow for adequate recovery between workouts.  Maintain physical fitness:  Strength, flexibility, and endurance.  Cardiovascular fitness.  Learn and use proper technique. When possible, have a coach correct improper technique.  Complete rehabilitation from a previous foot, ankle, or leg injury.  If you have flat feet, wear arch supports (orthotics). PROGNOSIS  If treated properly, then the symptoms of tendonitis usually resolve within 6 weeks. This period may be shorter for injuries caused by direct trauma. RELATED COMPLICATIONS   Prolonged healing time, if improperly treated or re-injured.  Recurrent symptoms that result in a chronic problem.  Partial or complete tendon tear (rupture) requiring surgery. TREATMENT  Treatment initially involves the use of ice and medication to help reduce pain and inflammation. The use of strengthening and stretching exercises may help reduce pain with activity. These exercises may be performed at home or with referral to a therapist. Often times, your caregiver will recommend immobilizing the ankle to allow the tendon to heal. If you have flat feet, the you may be advised to wear orthotic arch supports. If symptoms persist for greater than 6 months despite non-surgical (conservative) treatment, then surgery may be recommended. MEDICATION   If pain medication is necessary, then nonsteroidal anti-inflammatory medications, such as aspirin and ibuprofen, or other minor pain relievers, such as acetaminophen, are often recommended.  Do not take pain medication for 7 days before surgery.  Prescription pain relievers may be given if deemed necessary by  your caregiver. Use only as directed and only as much as you need.  Corticosteroid injections may be given by your caregiver. These injections should be reserved for the most serious cases, because they may only be given a certain number of times. HEAT AND COLD  Cold treatment (icing) relieves pain and reduces inflammation. Cold treatment should be applied for 10 to 15 minutes every 2 to 3 hours for inflammation and pain and immediately after any activity that aggravates your symptoms. Use ice packs or massage the area with a piece of ice (ice massage).  Heat treatment may be used prior to performing the stretching and strengthening activities prescribed by your caregiver, physical therapist, or athletic trainer. Use a heat pack or soak the injury in warm water. SEEK MEDICAL CARE IF:  Treatment seems to offer no benefit, or the condition worsens.  Any medications produce adverse side effects. EXERCISES RANGE OF MOTION (ROM) AND STRETCHING EXERCISES - Posterior Tibial Tendon Tendinitis These exercises may help you when beginning to rehabilitate your injury. Your symptoms may resolve with or without further involvement from your physician, physical therapist or athletic trainer. While completing these exercises, remember:   Restoring tissue flexibility helps normal motion to return to the joints. This allows healthier, less painful movement and activity.  An effective stretch should be held for at least 30 seconds.  A stretch should never be painful. You should only feel a gentle lengthening or release in the stretched tissue. RANGE OF MOTION - Ankle Plantar Flexion   Sit with your right / left leg crossed over your opposite knee.  Use your opposite hand to pull the top of your foot and toes toward you.  You should feel a gentle stretch on the top of your foot/ankle. Hold this position for __________ seconds. Repeat __________ times. Complete this exercise __________ times per day.  RANGE  OF MOTION - Ankle Eversion   Sit with your right / left ankle crossed over your opposite knee.  Grip your foot with your opposite hand, placing your thumb on the top of your foot and your fingers across the bottom of your foot.  Gently push your foot downward with a slight rotation so your littlest toes rise slightly  You should feel a gentle stretch on the inside of your ankle. Hold the stretch for __________ seconds. Repeat __________ times. Complete this exercise __________ times per day.  RANGE OF MOTION - Ankle Inversion   Sit with your right / left ankle crossed over your opposite knee.  Grip your foot with your opposite hand, placing your thumb on the bottom of your foot and your fingers across the top of your foot.  Gently pull your foot so the smallest toe comes toward you and your thumb pushes the inside of the ball of your foot away from you.  You should feel a gentle stretch on the outside of your ankle. Hold the stretch for __________ seconds. Repeat __________ times. Complete this exercise __________ times per day.  RANGE OF MOTION - Dorsi/Plantar Flexion  While sitting with your right / left knee straight, draw the top of your foot upwards by flexing your ankle. Then reverse the motion, pointing your toes downward.  Hold each position for __________ seconds.  After completing your first set of exercises, repeat this exercise with your knee bent. Repeat __________ times. Complete this exercise  __________ times per day.  RANGE OF MOTION - Ankle Alphabet  Imagine your right / left big toe is a pen.  Keeping your hip and knee still, write out the entire alphabet with your "pen." Make the letters as large as you can without increasing any discomfort. Repeat __________ times. Complete this exercise __________ times per day.  STRETCH - Gastrocsoleus   Sit with your right / left leg extended. Holding onto both ends of a belt or towel, loop it around the ball of your  foot.  Keeping your right / left ankle and foot relaxed and your knee straight, pull your foot and ankle toward you using the belt/towel.  You should feel a gentle stretch behind your calf or knee. Hold this position for __________ seconds. Repeat __________ times. Complete this exercise __________ times per day.  STRETCH - Gastroc, Standing   Place hands on wall.  Extend right / left leg, keeping the front knee somewhat bent.  Slightly point your toes inward on your back foot.  Keeping your right / left heel on the floor and your knee straight, shift your weight toward the wall, not allowing your back to arch.  You should feel a gentle stretch in the right / left calf. Hold this position for __________ seconds. Repeat __________ times. Complete this stretch __________ times per day. STRETCH - Soleus, Standing   Place hands on wall.  Extend right / left leg, keeping the other knee somewhat bent.  Slightly point your toes inward on your back foot.  Keep your right / left heel on the floor, bend your back knee, and slightly shift your weight over the back leg so that you feel a gentle stretch deep in your back calf.  Hold this position for __________ seconds. Repeat __________ times. Complete this stretch __________ times per day. STRENGTHENING EXERCISES - Posterior Tibial Tendon Tendinitis These exercises may help you when beginning to rehabilitate your injury. They may resolve your symptoms with or without further involvement from your physician, physical therapist or athletic trainer. While completing these exercises, remember:   Muscles can gain both the endurance and the strength needed for everyday activities through controlled exercises.  Complete these exercises as instructed by your physician, physical therapist or athletic trainer. Progress the resistance and repetitions only as guided. STRENGTH - Dorsiflexors  Secure a rubber exercise band/tubing to a fixed object (ie.  table, pole) and loop the other end around your right / left foot.  Sit on the floor facing the fixed object. The band/tubing should be slightly tense when your foot is relaxed.  Slowly draw your foot back toward you using your ankle and toes.  Hold this position for __________ seconds. Slowly release the tension in the band and return your foot to the starting position. Repeat __________ times. Complete this exercise __________ times per day.  STRENGTH - Towel Curls  Sit in a chair positioned on a non-carpeted surface.  Place your foot on a towel, keeping your heel on the floor.  Pull the towel toward your heel by only curling your toes. Keep your heel on the floor.  If instructed by your physician, physical therapist or athletic trainer, add ____________________ at the end of the towel. Repeat __________ times. Complete this exercise __________ times per day. STRENGTH - Ankle Eversion   Secure one end of a rubber exercise band/tubing to a fixed object (table, pole). Loop the other end around your foot just before your toes.  Place your fists between  your knees. This will focus your strengthening at your ankle.  Drawing the band/tubing across your opposite foot, slowly, pull your little toe out and up. Make sure the band/tubing is positioned to resist the entire motion.  Hold this position for __________ seconds.  Have your muscles resist the band/tubing as it slowly pulls your foot back to the starting position. Repeat __________ times. Complete this exercise __________ times per day.  STRENGTH - Ankle Inversion   Secure one end of a rubber exercise band/tubing to a fixed object (table, pole). Loop the other end around your foot just before your toes.  Place your fists between your knees. This will focus your strengthening at your ankle.  Slowly, pull your big toe up and in, making sure the band/tubing is positioned to resist the entire motion.  Hold this position for  __________ seconds.  Have your muscles resist the band/tubing as it slowly pulls your foot back to the starting position. Repeat __________ times. Complete this exercises __________ times per day.  Document Released: 10/18/2005 Document Revised: 01/10/2012 Document Reviewed: 01/30/2009 [facility] Patient Information 2015 [place], [place]. This information is not intended to replace advice given to you by your health care provider. Make sure you discuss any questions you have with your health care provider.

## 2014-05-09 NOTE — Assessment & Plan Note (Signed)
Patient was given injection today. Patient tolerated the procedure well and did have some resolution of pain immediately. We discussed range of motion exercises and was given a handout. We discussed icing as well as topical anti-inflammatories. Patient was given a prescription today. Patient will follow up again with me in 3-4 weeks for further evaluation and treatment. 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  Spent greater than 25 minutes with patient face-to-face and had greater than 50% of counseling including as described above in assessment and plan.
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
## 2014-05-09 NOTE — Assessment & Plan Note (Signed)
Patient pain is significantly better with conservative therapy. Encourage her to continue the exercises 3 times a week for another 6 weeks. Patient will try to titrate down the gabapentin but may needed if this is more of I nerve root impingement. Patient will start to titrate down and see if she needs it. If so we will for refill patient's gabapentin at any time. Otherwise patient can follow up on an as-needed basis for this the

## 2014-05-23 DIAGNOSIS — N950 Postmenopausal bleeding: Secondary | ICD-10-CM | POA: Diagnosis not present

## 2014-07-05 DIAGNOSIS — N950 Postmenopausal bleeding: Secondary | ICD-10-CM | POA: Diagnosis not present

## 2014-10-07 ENCOUNTER — Telehealth: Payer: Self-pay

## 2014-10-07 MED ORDER — ATORVASTATIN CALCIUM 20 MG PO TABS: 20.0000 mg | ORAL_TABLET | Freq: Every day | ORAL | Status: DC

## 2014-10-07 NOTE — Telephone Encounter (Signed)
Notified pt sent. 

## 2014-10-07 NOTE — Telephone Encounter (Signed)
Patient states her primary care physician has retired. She is currently seeking new pcp. Would like a refill on her; Atorvastin 20mg . Until she can get established. Pharmacy: [place] [address]  - Wake is an employee here at [employer]. ) Phone: [phone]

## 2014-10-07 NOTE — Telephone Encounter (Signed)
Meds ordered this encounter  Medications  . atorvastatin (LIPITOR) 20 MG tablet    Sig: Take 1 tablet (20 mg total) by mouth daily.    Dispense:  90 tablet    Refill:  0    Order Specific Question:  Supervising Provider    Answer:  DOOLITTLE, ROBERT P [[id number]]

## 2014-11-13 ENCOUNTER — Ambulatory Visit (INDEPENDENT_AMBULATORY_CARE_PROVIDER_SITE_OTHER): Payer: 59 | Admitting: Physician Assistant

## 2014-11-13 VITALS — BP 132/74 | HR 97 | Temp 99.0°F | Resp 16 | Ht 64.5 in | Wt 147.8 lb

## 2014-11-13 DIAGNOSIS — B349 Viral infection, unspecified: Secondary | ICD-10-CM

## 2014-11-13 NOTE — Patient Instructions (Signed)
Keep up the rest, hydration, Aleve. Be good to yourself.

## 2014-11-13 NOTE — Progress Notes (Signed)
Subjective:    Patient ID: Mary Potter, female    DOB: [birth year], 69 y.o.   MRN: [id number]   PCP: No PCP Per Patient  Chief Complaint  Patient presents with  . Cough    x2 days  . Fever  . Chills  . Fatigue  . Generalized Body Aches    Allergies  Allergen Reactions  . Talwin [Pentazocine] Anaphylaxis    Patient Active Problem List   Diagnosis Date Noted  . 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 H/O ankle fusion 05/09/2014  . Left leg pain 04/17/2014  . Lipid disorder 11/14/2012  . GERD 09/11/2010  . CONSTIPATION 09/11/2010  . DIARRHEA 09/11/2010    Prior to Admission medications   Medication Sig Start Date End Date Taking? Authorizing Provider  atorvastatin (LIPITOR) 20 MG tablet Take 1 tablet (20 mg total) by mouth daily. 10/07/14  Yes Amiracle Neises S Ennifer Harston, PA-C  cholecalciferol (VITAMIN D) 1000 UNITS tablet Take 1,000 Units by mouth daily.   Yes Historical Provider, MD  estradiol (VIVELLE-DOT) 0.05 MG/24HR Place 1 patch onto the skin once a week.   Yes Historical Provider, MD  gabapentin (NEURONTIN) 100 MG capsule Take 1 capsule (100 mg total) by mouth 3 (three) times daily. 04/17/14  Yes Lyndal Pulley, DO  medroxyPROGESTERone (PROVERA) 10 MG tablet Take for 14 days of each month 08/13/14   Historical Provider, MD    Medical, Surgical, Family and Social History reviewed and updated.  HPI  Presents for evaluation of illness that began Monday 11/11/2013 with scratchy throat, terrible HA and feeling cold. Then developed body aches, subjective fever, and generally not herself. "I feel totally under the weather." Took Emergen-C, and one dose of Aleve, which seemed to help some. A mild, non-productive cough. No nasal/sinus pressure or congestion. Some LEFT ear pain last night, none today. Throat doesn't hurt any more. Some increased urinary frequency. No diarrhea. A little nausea. Her granddaughter had a cold earlier this week.  Received a flu vaccine  in 08/2014.  Sees GYN tomorrow for routine visit. She is due for DEXA and pneumococcal vaccination.  Review of Systems As above. No CP, SOB, dizziness. No rash.    Objective:   Physical Exam  Constitutional: She is oriented to person, place, and time. She appears well-developed and well-nourished. No distress.  BP 132/74 mmHg  Pulse 97  Temp(Src) 99 F (37.2 C) (Oral)  Resp 16  Ht 5' 4.5" (1.638 m)  Wt 147 lb 12.8 oz (67.042 kg)  BMI 24.99 kg/m2  SpO2 98%   HENT:  Head: Normocephalic and atraumatic.  Right Ear: Hearing, tympanic membrane, external ear and ear canal normal.  Left Ear: Hearing, tympanic membrane, external ear and ear canal normal.  Nose: Nose normal.  Mouth/Throat: Uvula is midline, oropharynx is clear and moist and mucous membranes are normal. Normal dentition.  Eyes: Conjunctivae, EOM and lids are normal. Pupils are equal, round, and reactive to light. No scleral icterus.  Fundoscopic exam:      The right eye shows no hemorrhage and no papilledema. The right eye shows red reflex.       The left eye shows no hemorrhage and no papilledema. The left eye shows red reflex.  Neck: Normal range of motion and full passive range of motion without pain. Neck supple. No spinous process tenderness and no muscular tenderness present. No edema and normal range of motion present. No thyromegaly present.  Cardiovascular: Normal rate, regular rhythm, normal heart sounds and intact  distal pulses.   Pulmonary/Chest: Effort normal and breath sounds normal.  Lymphadenopathy:    She has no cervical adenopathy.       Right: No supraclavicular adenopathy present.       Left: No supraclavicular adenopathy present.  Neurological: She is alert and oriented to person, place, and time. She has normal strength. No cranial nerve deficit or sensory deficit.  Reflex Scores:      Bicep reflexes are 2+ on the right side and 2+ on the left side.      Patellar reflexes are 2+ on the right side  and 2+ on the left side.      Achilles reflexes are 2+ on the right side and 2+ on the left side. Skin: Skin is warm and dry.  Psychiatric: She has a normal mood and affect. Her speech is normal and behavior is normal. Cognition and memory are not impaired. She does not express inappropriate judgment.          Assessment & Plan:  1. Viral syndrome Doubt influenza, but suspect other viral syndrome. Anticipatory guidance provided. Supportive care with rest, fluids and NSAIDS. If she develops new or worsening symptoms or if symptoms persist, re-evaluate.   Fara Chute, PA-C Physician Assistant-Certified [facility]

## 2014-11-14 DIAGNOSIS — N950 Postmenopausal bleeding: Secondary | ICD-10-CM | POA: Diagnosis not present

## 2014-11-16 ENCOUNTER — Telehealth: Payer: Self-pay

## 2014-11-16 NOTE — Telephone Encounter (Signed)
Patient has a terrible cough, wants to know if something can be called in to help her sleep

## 2014-11-17 NOTE — Telephone Encounter (Signed)
We would need to see her for evaluation.  She can try OTC delsym to see if that will help.

## 2014-11-18 ENCOUNTER — Telehealth: Payer: Self-pay

## 2014-11-18 MED ORDER — HYDROCOD POLST-CHLORPHEN POLST 10-8 MG/5ML PO LQCR: 5.0000 mL | Freq: Two times a day (BID) | ORAL | Status: DC | PRN

## 2014-11-18 NOTE — Telephone Encounter (Signed)
Mickie Kay at 11/18/2014 9:11 AM     Status: Signed       Expand All Collapse All   Please note patient was seen for a virus on Wednesday (2 MRN's).             Mickie Kay at 11/18/2014 8:31 AM     Status: Signed       Expand All Collapse All   Please note patient was seen for a virus on Wednesday, the cough had             Mancel Bale, PA-C at 11/17/2014 1:06 PM     Status: Signed       Expand All Collapse All   We would need to see her for evaluation. She can try OTC delsym to see if that will help.            Cathlyn Parsons [place] at 11/16/2014 12:04 PM     Status: Signed       Expand All Collapse All   Patient has a terrible cough, wants to know if something can be called in to help her sleep

## 2014-11-18 NOTE — Telephone Encounter (Signed)
Copied note into correct MRN to be addressed.

## 2014-11-18 NOTE — Telephone Encounter (Signed)
Rx printed.  Meds ordered this encounter  Medications  . chlorpheniramine-HYDROcodone (TUSSIONEX PENNKINETIC ER) 10-8 MG/5ML LQCR    Sig: Take 5 mLs by mouth every 12 (twelve) hours as needed for cough (cough).    Dispense:  100 mL    Refill:  0    Order Specific Question:  Supervising Provider    Answer:  DOOLITTLE, ROBERT P [[id number]]    If symptoms persist, re-evaluate. May need an antibiotic.

## 2014-11-18 NOTE — Telephone Encounter (Signed)
Please note patient was seen for a virus on Wednesday, the cough had

## 2014-11-18 NOTE — Telephone Encounter (Signed)
Please note patient was seen for a virus on Wednesday (2 MRN's).

## 2014-11-18 NOTE — Telephone Encounter (Signed)
LM for pt Rx in pick up drawer.

## 2014-11-26 DIAGNOSIS — N950 Postmenopausal bleeding: Secondary | ICD-10-CM | POA: Diagnosis not present

## 2014-12-12 DIAGNOSIS — N8500 Endometrial hyperplasia, unspecified: Secondary | ICD-10-CM | POA: Diagnosis not present

## 2014-12-12 DIAGNOSIS — N950 Postmenopausal bleeding: Secondary | ICD-10-CM | POA: Diagnosis not present

## 2014-12-12 DIAGNOSIS — Z79890 Hormone replacement therapy (postmenopausal): Secondary | ICD-10-CM | POA: Diagnosis not present

## 2014-12-24 ENCOUNTER — Other Ambulatory Visit: Payer: Self-pay | Admitting: Obstetrics & Gynecology

## 2014-12-31 ENCOUNTER — Other Ambulatory Visit: Payer: Self-pay

## 2014-12-31 ENCOUNTER — Encounter (HOSPITAL_COMMUNITY)
Admission: RE | Admit: 2014-12-31 | Discharge: 2014-12-31 | Disposition: A | Discharge status: Home / Self Care | Payer: 59 | Source: Ambulatory Visit | Attending: Obstetrics & Gynecology | Admitting: Obstetrics & Gynecology

## 2014-12-31 ENCOUNTER — Encounter (HOSPITAL_COMMUNITY): Payer: Self-pay

## 2014-12-31 DIAGNOSIS — N840 Polyp of corpus uteri: Secondary | ICD-10-CM | POA: Diagnosis not present

## 2014-12-31 DIAGNOSIS — R938 Abnormal findings on diagnostic imaging of other specified body structures: Secondary | ICD-10-CM | POA: Diagnosis not present

## 2014-12-31 DIAGNOSIS — E785 Hyperlipidemia, unspecified: Secondary | ICD-10-CM | POA: Diagnosis not present

## 2014-12-31 DIAGNOSIS — Z79890 Hormone replacement therapy (postmenopausal): Secondary | ICD-10-CM | POA: Diagnosis not present

## 2014-12-31 DIAGNOSIS — Z9089 Acquired absence of other organs: Secondary | ICD-10-CM | POA: Diagnosis not present

## 2014-12-31 DIAGNOSIS — K219 Gastro-esophageal reflux disease without esophagitis: Secondary | ICD-10-CM | POA: Diagnosis not present

## 2014-12-31 DIAGNOSIS — N950 Postmenopausal bleeding: Secondary | ICD-10-CM | POA: Diagnosis not present

## 2014-12-31 DIAGNOSIS — N8500 Endometrial hyperplasia, unspecified: Secondary | ICD-10-CM | POA: Diagnosis not present

## 2014-12-31 HISTORY — DX: Nausea with vomiting, unspecified: R11.2

## 2014-12-31 HISTORY — DX: Other specified postprocedural states: R11.2

## 2014-12-31 HISTORY — DX: Other specified postprocedural states: Z98.890

## 2014-12-31 LAB — CBC
HCT: 38.6 % (ref 36.0–46.0)
Hemoglobin: 12.7 g/dL (ref 12.0–15.0)
MCH: 29 pg (ref 26.0–34.0)
MCHC: 32.9 g/dL (ref 30.0–36.0)
MCV: 88.1 fL (ref 78.0–100.0)
Platelets: 274 10*3/uL (ref 150–400)
RBC: 4.38 MIL/uL (ref 3.87–5.11)
RDW: 14.5 % (ref 11.5–15.5)
WBC: 5.8 10*3/uL (ref 4.0–10.5)

## 2014-12-31 NOTE — Patient Instructions (Signed)
Your procedure is scheduled on:01/03/15  Enter through the Main Entrance at :0830 am Pick up desk phone and dial [phone] and inform us of your arrival.  Please call [phone] if you have any problems the morning of surgery.  Remember: Do not eat food after midnight:Thursday Clear liquids are ok until:6am on Friday   You may brush your teeth the morning of surgery.  Take these meds the morning of surgery with a sip of water:none  DO NOT wear jewelry, eye make-up, lipstick,body lotion, or dark fingernail polish.  (Polished toes are ok) You may wear deodorant.  If you are to be admitted after surgery, leave suitcase in car until your room has been assigned. Patients discharged on the day of surgery will not be allowed to drive home. Wear loose fitting, comfortable clothes for your ride home.

## 2015-01-02 NOTE — H&P (Addendum)
Mary Potter is an 69 y.o. female who presented to me for a second opinion for evaluation of postmenopausal bleeding and thick endometrial stripe on office sono with another practice.   Patient is a menopausal female using combined Estrogen/progestin HRT (Minivelle and Prometrium ) but was not using Prometrium as advised due to side effects and had stopped for several years. She presented to a new doctor with bleeding and had endometrial biopsy in office after thick endometrium noted endometrial hyperplasia and was given medication to reverse. F/up sono still noted thick endometrial stripe and she was recommended this procedure . She had not used Prometrium in several yearst age 15) when she was seeing another MD. Office endometrial biopsy by yet another MD noted endometrial hyperplasia and then she presented to me for 2nd opinion. No LMP recorded. Patient is postmenopausal.    Past Medical History  Diagnosis Date  . Hyperlipidemia   . PONV (postoperative nausea and vomiting)     Past Surgical History  Procedure Laterality Date  . Ankle fracture surgery    . Tonsillectomy and adenoidectomy      Family History  Problem Relation Age of Onset  . Heart disease Mother   . Stroke Father   . Heart disease Brother 36  . Heart disease Maternal Grandmother   . Cancer Maternal Grandfather   . Heart disease Paternal Grandmother   . Cancer Paternal Grandfather   . Heart disease Brother   . Heart disease Brother   . Heart disease Brother   . Hypertension Sister     Social History:  reports that she has never smoked. She has never used smokeless tobacco. She reports that she does not drink alcohol or use illicit drugs.  Allergies:  Allergies  Allergen Reactions  . Talwin [Pentazocine] Anaphylaxis    No prescriptions prior to admission    ROS Physical Exam   A&O x 3, no acute distress. Pleasant HEENT neg, no thyromegaly Lungs CTA bilat CV RRR, A1S2 normal Abdo soft, non  tender, non acute Extr no edema/ tenderness Pelvic  No results found for this or any previous visit (from the past 24 hour(s)).  No results found.  Assessment/Plan: 69 yo menopausal female with abnormal/ postmenopausal vaginal bleeding, currently using HRT and is s/p recent endometrial ablation (at age 10) when she was seeing another MD. Office endometrial biopsy by yet another MD noted endometrial hyperplasia and then she presented to me for 2nd opinion.  Plan to perform hysteroscopy and plan directed biopsies in light of endometrial ablation and not repeat office endometrial biopsy at this point due to ablated endometrium. Risks/ procedure reviewed, especially perforation, bleeding, infection and possibility of still missing endometrial cancer diagnosis due to endometrial ablation.   Manon Hilding, MD

## 2015-01-03 ENCOUNTER — Encounter (HOSPITAL_COMMUNITY): Payer: Self-pay | Admitting: Anesthesiology

## 2015-01-03 ENCOUNTER — Encounter (HOSPITAL_COMMUNITY)
Admission: RE | Disposition: A | Discharge status: Home / Self Care | Payer: Self-pay | Source: Ambulatory Visit | Attending: Obstetrics & Gynecology

## 2015-01-03 ENCOUNTER — Ambulatory Visit (HOSPITAL_COMMUNITY)
Admission: RE | Admit: 2015-01-03 | Discharge: 2015-01-03 | Disposition: A | Discharge status: Home / Self Care | Payer: 59 | Source: Ambulatory Visit | Attending: Obstetrics & Gynecology | Admitting: Obstetrics & Gynecology

## 2015-01-03 ENCOUNTER — Ambulatory Visit (HOSPITAL_COMMUNITY): Payer: 59 | Admitting: Anesthesiology

## 2015-01-03 DIAGNOSIS — N950 Postmenopausal bleeding: Secondary | ICD-10-CM | POA: Insufficient documentation

## 2015-01-03 DIAGNOSIS — E785 Hyperlipidemia, unspecified: Secondary | ICD-10-CM | POA: Insufficient documentation

## 2015-01-03 DIAGNOSIS — N840 Polyp of corpus uteri: Secondary | ICD-10-CM | POA: Insufficient documentation

## 2015-01-03 DIAGNOSIS — R938 Abnormal findings on diagnostic imaging of other specified body structures: Secondary | ICD-10-CM | POA: Insufficient documentation

## 2015-01-03 DIAGNOSIS — Z9089 Acquired absence of other organs: Secondary | ICD-10-CM | POA: Insufficient documentation

## 2015-01-03 DIAGNOSIS — K219 Gastro-esophageal reflux disease without esophagitis: Secondary | ICD-10-CM | POA: Insufficient documentation

## 2015-01-03 DIAGNOSIS — Z79890 Hormone replacement therapy (postmenopausal): Secondary | ICD-10-CM | POA: Insufficient documentation

## 2015-01-03 DIAGNOSIS — N8500 Endometrial hyperplasia, unspecified: Secondary | ICD-10-CM | POA: Diagnosis not present

## 2015-01-03 DIAGNOSIS — R9389 Abnormal findings on diagnostic imaging of other specified body structures: Secondary | ICD-10-CM | POA: Diagnosis present

## 2015-01-03 HISTORY — PX: HYSTEROSCOPY WITH D & C: SHX1775

## 2015-01-03 HISTORY — DX: Endometrial hyperplasia, unspecified: N85.00

## 2015-01-03 HISTORY — DX: Abnormal findings on diagnostic imaging of other specified body structures: R93.89

## 2015-01-03 SURGERY — DILATATION AND CURETTAGE /HYSTEROSCOPY
Anesthesia: General

## 2015-01-03 MED ORDER — ONDANSETRON HCL 4 MG/2ML IJ SOLN
INTRAMUSCULAR | Status: DC | PRN
  Administered 2015-01-03: 4 mg via INTRAVENOUS

## 2015-01-03 MED ORDER — CEFAZOLIN SODIUM-DEXTROSE 2-3 GM-% IV SOLR
INTRAVENOUS | Status: AC
  Filled 2015-01-03: qty 50

## 2015-01-03 MED ORDER — SODIUM CHLORIDE 0.9 % IR SOLN
Status: DC | PRN
  Administered 2015-01-03: 3000 mL

## 2015-01-03 MED ORDER — LIDOCAINE HCL 1 % IJ SOLN
INTRAMUSCULAR | Status: DC | PRN
  Administered 2015-01-03: 10 mL

## 2015-01-03 MED ORDER — DEXAMETHASONE SODIUM PHOSPHATE 4 MG/ML IJ SOLN
INTRAMUSCULAR | Status: AC
  Filled 2015-01-03: qty 1

## 2015-01-03 MED ORDER — PROPOFOL 10 MG/ML IV BOLUS
INTRAVENOUS | Status: AC
  Filled 2015-01-03: qty 20

## 2015-01-03 MED ORDER — DEXAMETHASONE SODIUM PHOSPHATE 10 MG/ML IJ SOLN
INTRAMUSCULAR | Status: DC | PRN
  Administered 2015-01-03: 4 mg via INTRAVENOUS

## 2015-01-03 MED ORDER — ONDANSETRON HCL 4 MG/2ML IJ SOLN
INTRAMUSCULAR | Status: AC
  Filled 2015-01-03: qty 2

## 2015-01-03 MED ORDER — MIDAZOLAM HCL 2 MG/2ML IJ SOLN
INTRAMUSCULAR | Status: AC
  Filled 2015-01-03: qty 2

## 2015-01-03 MED ORDER — KETOROLAC TROMETHAMINE 30 MG/ML IJ SOLN
INTRAMUSCULAR | Status: DC | PRN
  Administered 2015-01-03: 15 mg via INTRAVENOUS

## 2015-01-03 MED ORDER — MIDAZOLAM HCL 2 MG/2ML IJ SOLN
INTRAMUSCULAR | Status: DC | PRN
  Administered 2015-01-03: 1 mg via INTRAVENOUS

## 2015-01-03 MED ORDER — PHENYLEPHRINE 40 MCG/ML (10ML) SYRINGE FOR IV PUSH (FOR BLOOD PRESSURE SUPPORT)
PREFILLED_SYRINGE | INTRAVENOUS | Status: AC
  Filled 2015-01-03: qty 20

## 2015-01-03 MED ORDER — PROPOFOL 10 MG/ML IV BOLUS
INTRAVENOUS | Status: DC | PRN
  Administered 2015-01-03: 180 mg via INTRAVENOUS

## 2015-01-03 MED ORDER — FENTANYL CITRATE 0.05 MG/ML IJ SOLN
INTRAMUSCULAR | Status: DC | PRN
  Administered 2015-01-03: 50 ug via INTRAVENOUS

## 2015-01-03 MED ORDER — LIDOCAINE HCL (CARDIAC) 20 MG/ML IV SOLN
INTRAVENOUS | Status: AC
  Filled 2015-01-03: qty 5

## 2015-01-03 MED ORDER — LIDOCAINE HCL 1 % IJ SOLN
INTRAMUSCULAR | Status: AC
  Filled 2015-01-03: qty 20

## 2015-01-03 MED ORDER — LIDOCAINE HCL (CARDIAC) 20 MG/ML IV SOLN
INTRAVENOUS | Status: DC | PRN
  Administered 2015-01-03: 80 mg via INTRAVENOUS

## 2015-01-03 MED ORDER — KETOROLAC TROMETHAMINE 30 MG/ML IJ SOLN
INTRAMUSCULAR | Status: AC
  Filled 2015-01-03: qty 1

## 2015-01-03 MED ORDER — LACTATED RINGERS IV SOLN
INTRAVENOUS | Status: DC
  Administered 2015-01-03 (×2): via INTRAVENOUS

## 2015-01-03 MED ORDER — CEFAZOLIN SODIUM-DEXTROSE 2-3 GM-% IV SOLR
2.0000 g | INTRAVENOUS | Status: AC
  Administered 2015-01-03: 2 g via INTRAVENOUS

## 2015-01-03 MED ORDER — PHENYLEPHRINE HCL 10 MG/ML IJ SOLN
INTRAMUSCULAR | Status: DC | PRN
  Administered 2015-01-03 (×3): 40 ug via INTRAVENOUS

## 2015-01-03 MED ORDER — FENTANYL CITRATE 0.05 MG/ML IJ SOLN
INTRAMUSCULAR | Status: AC
  Filled 2015-01-03: qty 2

## 2015-01-03 SURGICAL SUPPLY — 18 items
CANISTER SUCT 3000ML (MISCELLANEOUS) ×2 IMPLANT
CATH ROBINSON RED A/P 16FR (CATHETERS) ×2 IMPLANT
CLOTH BEACON ORANGE TIMEOUT ST (SAFETY) ×2 IMPLANT
CONTAINER PREFILL 10% NBF 60ML (FORM) ×4 IMPLANT
ELECT REM PT RETURN 9FT ADLT (ELECTROSURGICAL) ×2
ELECTRODE REM PT RTRN 9FT ADLT (ELECTROSURGICAL) ×1 IMPLANT
ELECTRODE ROLLER VERSAPOINT (ELECTRODE) IMPLANT
ELECTRODE RT ANGLE VERSAPOINT (CUTTING LOOP) IMPLANT
GLOVE BIO SURGEON STRL SZ7 (GLOVE) ×2 IMPLANT
GLOVE BIOGEL PI IND STRL 7.0 (GLOVE) ×2 IMPLANT
GLOVE BIOGEL PI INDICATOR 7.0 (GLOVE) ×2
GOWN STRL REUS W/TWL LRG LVL3 (GOWN DISPOSABLE) ×4 IMPLANT
PACK VAGINAL MINOR WOMEN LF (CUSTOM PROCEDURE TRAY) ×2 IMPLANT
PAD OB MATERNITY 4.3X12.25 (PERSONAL CARE ITEMS) ×2 IMPLANT
TOWEL OR 17X24 6PK STRL BLUE (TOWEL DISPOSABLE) ×4 IMPLANT
TUBING AQUILEX INFLOW (TUBING) ×2 IMPLANT
TUBING AQUILEX OUTFLOW (TUBING) ×2 IMPLANT
WATER STERILE IRR 1000ML POUR (IV SOLUTION) ×2 IMPLANT

## 2015-01-03 NOTE — Anesthesia Procedure Notes (Signed)
Procedure Name: LMA Insertion Date/Time: 01/03/2015 9:57 AM Performed by: France Ravens HRISTOVA Pre-anesthesia Checklist: Patient identified, Emergency Drugs available, Suction available and Patient being monitored Patient Re-evaluated:Patient Re-evaluated prior to inductionOxygen Delivery Method: Circle system utilized Preoxygenation: Pre-oxygenation with 100% oxygen Intubation Type: IV induction Ventilation: Mask ventilation without difficulty LMA: LMA inserted LMA Size: 4.0 Number of attempts: 1 Tube secured with: Tape Dental Injury: Teeth and Oropharynx as per pre-operative assessment

## 2015-01-03 NOTE — Discharge Instructions (Signed)
Hysteroscopy, Care After °Refer to this sheet in the next few weeks. These instructions provide you with information on caring for yourself after your procedure. Your health care provider may also give you more specific instructions. Your treatment has been planned according to current medical practices, but problems sometimes occur. Call your health care provider if you have any problems or questions after your procedure.  °WHAT TO EXPECT AFTER THE PROCEDURE °After your procedure, it is typical to have the following: °· You may have some cramping. This normally lasts for a couple days. °· You may have bleeding. This can vary from light spotting for a few days to menstrual-like bleeding for 3-7 days. °HOME CARE INSTRUCTIONS °· Rest for the first 1-2 days after the procedure. °· Only take over-the-counter or prescription medicines as directed by your health care provider. Do not take aspirin. It can increase the chances of bleeding. °· Take showers instead of baths for 2 weeks or as directed by your health care provider. °· Do not drive for 24 hours or as directed. °· Do not drink alcohol while taking pain medicine. °· Do not use tampons, douche, or have sexual intercourse for 2 weeks or until your health care provider says it is okay. °· Take your temperature twice a day for 4-5 days. Write it down each time. °· Follow your health care provider's advice about diet, exercise, and lifting. °· If you develop constipation, you may: °¨ Take a mild laxative if your health care provider approves. °¨ Add bran foods to your diet. °¨ Drink enough fluids to keep your urine clear or pale yellow. °· Try to have someone with you or available to you for the first 24-48 hours, especially if you were given a general anesthetic. °· Follow up with your health care provider as directed. °SEEK MEDICAL CARE IF: °· You feel dizzy or lightheaded. °· You feel sick to your stomach (nauseous). °· You have abnormal vaginal discharge. °· You  have a rash. °· You have pain that is not controlled with medicine. °SEEK IMMEDIATE MEDICAL CARE IF: °· You have bleeding that is heavier than a normal menstrual period. °· You have a fever. °· You have increasing cramps or pain, not controlled with medicine. °· You have new belly (abdominal) pain. °· You pass out. °· You have pain in the tops of your shoulders (shoulder strap areas). °· You have shortness of breath. °Document Released: 08/08/2013 Document Reviewed: 08/08/2013 °Exit[employer]® Patient Information ©2015 [employer]. This information is not intended to replace advice given to you by your health care provider. Make sure you discuss any questions you have with your health care provider. ° °

## 2015-01-03 NOTE — Anesthesia Postprocedure Evaluation (Signed)
  Anesthesia Post-op Note  Patient: Mary Potter  Procedure(s) Performed: Procedure(s): DILATATION AND CURETTAGE /HYSTEROSCOPY (N/A)  Patient Location: PACU  Anesthesia Type:General  Level of Consciousness: awake, alert  and oriented  Airway and Oxygen Therapy: Patient Spontanous Breathing  Post-op Pain: none  Post-op Assessment: Post-op Vital signs reviewed, Patient's Cardiovascular Status Stable, Respiratory Function Stable, Patent Airway, No signs of Nausea or vomiting and Pain level controlled  Post-op Vital Signs: Reviewed and stable  Last Vitals:  Filed Vitals:   01/03/15 1145  BP: 145/75  Pulse: 76  Temp: 36.8 C  Resp: 20    Complications: No apparent anesthesia complications

## 2015-01-03 NOTE — Transfer of Care (Signed)
Immediate Anesthesia Transfer of Care Note  Patient: Mary Potter  Procedure(s) Performed: Procedure(s): DILATATION AND CURETTAGE /HYSTEROSCOPY (N/A)  Patient Location: PACU  Anesthesia Type:General  Level of Consciousness: awake, alert , oriented and patient cooperative  Airway & Oxygen Therapy: Patient Spontanous Breathing and Patient connected to nasal cannula oxygen  Post-op Assessment: Report given to RN, Post -op Vital signs reviewed and stable and Patient moving all extremities  Post vital signs: Reviewed and stable  Last Vitals:  Filed Vitals:   01/03/15 0834  BP: 145/84  Pulse: 90  Temp: 36.9 C  Resp: 18    Complications: No apparent anesthesia complications

## 2015-01-03 NOTE — Anesthesia Preprocedure Evaluation (Addendum)
Anesthesia Evaluation  Patient identified by MRN, date of birth, ID band Patient awake    Reviewed: Allergy & Precautions, NPO status , Patient's Chart, lab work & pertinent test results  History of Anesthesia Complications (+) PONV and history of anesthetic complications  Airway Mallampati: I  TM Distance: >3 FB Neck ROM: Full    Dental no notable dental hx. (+) Teeth Intact   Pulmonary neg pulmonary ROS,  breath sounds clear to auscultation  Pulmonary exam normal       Cardiovascular negative cardio ROS  Rhythm:Regular Rate:Normal     Neuro/Psych negative neurological ROS  negative psych ROS   GI/Hepatic Neg liver ROS, GERD-  Medicated and Controlled,  Endo/Other  negative endocrine ROS  Renal/GU negative Renal ROS  negative genitourinary   Musculoskeletal negative musculoskeletal ROS (+)   Abdominal   Peds  Hematology negative hematology ROS (+)   Anesthesia Other Findings   Reproductive/Obstetrics PMB                            Anesthesia Physical Anesthesia Plan  ASA: II  Anesthesia Plan: General   Post-op Pain Management:    Induction: Intravenous  Airway Management Planned: LMA  Additional Equipment:   Intra-op Plan:   Post-operative Plan: Extubation in OR  Informed Consent: I have reviewed the patients History and Physical, chart, labs and discussed the procedure including the risks, benefits and alternatives for the proposed anesthesia with the patient or authorized representative who has indicated his/her understanding and acceptance.   Dental advisory given  Plan Discussed with: CRNA, Anesthesiologist and Surgeon  Anesthesia Plan Comments:         Anesthesia Quick Evaluation

## 2015-01-03 NOTE — Op Note (Signed)
Preoperative diagnosis: Thickened endometrium, postmenopausal bleeding. HRT use.  Postop diagnosis: as above.  Procedure: Diagnostic Hysteroscopy and D&C Anesthesia General via LMA  Surgeon: [name], MD  Assistant: none  IV fluids: 900 cc LR Estimated blood loss : 5 cc  Urine output: n/a  Complications none  Condition stable  Disposition PACU  Specimen: Endometrial curettings   Procedure  Indication: 69 yo female with thickened endometrium on sonogram. She is currently using HRT. She had prior endometrial ablation for postmenopausal bleeding when seeing another provider. Office endometrial biopsy noted endometrial hyperplasia with yet another provider and was given cyclic Progestin. Instead of repeating office endometrial biopsy, I recommended hysteroscopy and biopsy due to prior ablation and very likely not getting global sample well. Also reviewed possible difficulty with visualization due to ablation. Patient was counseled on risks/ complications including infection, bleeding, damage to internal organs, she understood and agrees, gave informed written consent.  Patient was brought to the operating room with IV running. Time out was carried out. She received preop 1 gm Ancef. She underwent general anesthesia via LMA without complications. She was given dorsolithotomy position. Parts were prepped and draped in standard fashion. Bimanual exam revealed uterus to be anteverted and normal size. Speculum was placed and cervix was grasped with single-tooth tenaculum. Cervical block with 10 cc 1% plain Xylocaine given. The uterus was sounded to 7 cm. Cervical os was dilated to 19 [place] dilator. Hysteroscope was introduced in the uterine cavity under vision, using saline for irrigation. Findings: Endometrial cavity was very irregular, rough appearing with no obvious masses. Tubal ostii were not seen at all. Hysteroscopic directed biopsies of endometrial tissue was done but felt fibrous on grasping  and not much tissue return noted. Cervical canal appeared normal. Hysteroscope was removed. Endometrial curettage was performed with sharp curette, minimal tissue returned and sent to pathology. All instruments were removed.  Fluid deficit 70 cc.  All counts are correct x2. No complications, procedure was well tolerated. Patient was made supine dorsal anesthesia and brought to the recovery room in stable condition.  Patient will be discharged home today. Follow up in 2 weeks in office. Warning signs of infection and excessive bleeding reviewed.   V.Pio Eatherly, MD.

## 2015-01-06 ENCOUNTER — Encounter (HOSPITAL_COMMUNITY): Payer: Self-pay | Admitting: Obstetrics & Gynecology

## 2015-04-28 ENCOUNTER — Other Ambulatory Visit: Payer: Self-pay

## 2015-07-24 DIAGNOSIS — H2513 Age-related nuclear cataract, bilateral: Secondary | ICD-10-CM | POA: Diagnosis not present

## 2015-07-31 DIAGNOSIS — Z79890 Hormone replacement therapy (postmenopausal): Secondary | ICD-10-CM | POA: Diagnosis not present

## 2015-07-31 DIAGNOSIS — Z780 Asymptomatic menopausal state: Secondary | ICD-10-CM | POA: Diagnosis not present

## 2015-08-05 ENCOUNTER — Other Ambulatory Visit: Payer: Self-pay | Admitting: *Deleted

## 2016-01-14 MED FILL — ESTRADIOL 0.05 MG PATCH: 0.05 | 55 days supply | Qty: 16 | Fill #6

## 2016-01-14 MED FILL — PROGESTERONE 100 MG CAPSULE: 100 | 84 days supply | Qty: 36 | Fill #4

## 2016-04-02 DIAGNOSIS — Z1151 Encounter for screening for human papillomavirus (HPV): Secondary | ICD-10-CM | POA: Diagnosis not present

## 2016-04-02 DIAGNOSIS — Z01419 Encounter for gynecological examination (general) (routine) without abnormal findings: Secondary | ICD-10-CM | POA: Diagnosis not present

## 2016-04-02 DIAGNOSIS — Z6826 Body mass index (BMI) 26.0-26.9, adult: Secondary | ICD-10-CM | POA: Diagnosis not present

## 2016-04-02 MED FILL — ESTRADIOL 0.05 MG PATCH: 0.05 | 84 days supply | Qty: 24 | Fill #0

## 2016-04-06 MED FILL — PROGESTERONE 100 MG CAPSULE: 100 | 84 days supply | Qty: 36 | Fill #0

## 2016-07-13 MED FILL — PROGESTERONE 100 MG CAPSULE: 100 | 84 days supply | Qty: 36 | Fill #1

## 2016-07-13 MED FILL — ESTRADIOL 0.05 MG PATCH: 0.05 | 84 days supply | Qty: 24 | Fill #1

## 2016-07-22 DIAGNOSIS — Z1231 Encounter for screening mammogram for malignant neoplasm of breast: Secondary | ICD-10-CM | POA: Diagnosis not present

## 2016-07-22 DIAGNOSIS — R921 Mammographic calcification found on diagnostic imaging of breast: Secondary | ICD-10-CM | POA: Diagnosis not present

## 2016-07-27 DIAGNOSIS — R921 Mammographic calcification found on diagnostic imaging of breast: Secondary | ICD-10-CM | POA: Diagnosis not present

## 2016-07-27 DIAGNOSIS — Z1231 Encounter for screening mammogram for malignant neoplasm of breast: Secondary | ICD-10-CM | POA: Diagnosis not present

## 2016-07-28 ENCOUNTER — Other Ambulatory Visit: Payer: Self-pay | Admitting: Radiology

## 2016-07-28 DIAGNOSIS — R921 Mammographic calcification found on diagnostic imaging of breast: Secondary | ICD-10-CM | POA: Diagnosis not present

## 2016-07-28 DIAGNOSIS — D242 Benign neoplasm of left breast: Secondary | ICD-10-CM | POA: Diagnosis not present

## 2016-07-28 DIAGNOSIS — Z0000 Encounter for general adult medical examination without abnormal findings: Secondary | ICD-10-CM | POA: Diagnosis not present

## 2016-08-12 DIAGNOSIS — R938 Abnormal findings on diagnostic imaging of other specified body structures: Secondary | ICD-10-CM | POA: Diagnosis not present

## 2016-08-12 DIAGNOSIS — N8500 Endometrial hyperplasia, unspecified: Secondary | ICD-10-CM | POA: Diagnosis not present

## 2016-08-12 DIAGNOSIS — Z79890 Hormone replacement therapy (postmenopausal): Secondary | ICD-10-CM | POA: Diagnosis not present

## 2016-08-12 DIAGNOSIS — Z8742 Personal history of other diseases of the female genital tract: Secondary | ICD-10-CM | POA: Diagnosis not present

## 2016-08-12 DIAGNOSIS — N858 Other specified noninflammatory disorders of uterus: Secondary | ICD-10-CM | POA: Diagnosis not present

## 2016-08-17 DIAGNOSIS — Z0000 Encounter for general adult medical examination without abnormal findings: Secondary | ICD-10-CM | POA: Diagnosis not present

## 2016-08-17 DIAGNOSIS — N390 Urinary tract infection, site not specified: Secondary | ICD-10-CM | POA: Diagnosis not present

## 2016-08-17 DIAGNOSIS — M810 Age-related osteoporosis without current pathological fracture: Secondary | ICD-10-CM | POA: Diagnosis not present

## 2016-08-17 DIAGNOSIS — R8299 Other abnormal findings in urine: Secondary | ICD-10-CM | POA: Diagnosis not present

## 2016-08-19 MED FILL — PROGESTERONE 100 MG CAPSULE: 100 | 90 days supply | Qty: 90 | Fill #0

## 2016-08-24 DIAGNOSIS — Z8249 Family history of ischemic heart disease and other diseases of the circulatory system: Secondary | ICD-10-CM | POA: Diagnosis not present

## 2016-08-24 DIAGNOSIS — R3121 Asymptomatic microscopic hematuria: Secondary | ICD-10-CM | POA: Diagnosis not present

## 2016-08-24 DIAGNOSIS — R928 Other abnormal and inconclusive findings on diagnostic imaging of breast: Secondary | ICD-10-CM | POA: Diagnosis not present

## 2016-08-24 DIAGNOSIS — R030 Elevated blood-pressure reading, without diagnosis of hypertension: Secondary | ICD-10-CM | POA: Diagnosis not present

## 2016-08-24 DIAGNOSIS — Z1389 Encounter for screening for other disorder: Secondary | ICD-10-CM | POA: Diagnosis not present

## 2016-08-24 DIAGNOSIS — Z6826 Body mass index (BMI) 26.0-26.9, adult: Secondary | ICD-10-CM | POA: Diagnosis not present

## 2016-08-24 DIAGNOSIS — E784 Other hyperlipidemia: Secondary | ICD-10-CM | POA: Diagnosis not present

## 2016-08-24 DIAGNOSIS — Z0000 Encounter for general adult medical examination without abnormal findings: Secondary | ICD-10-CM | POA: Diagnosis not present

## 2016-08-24 DIAGNOSIS — M810 Age-related osteoporosis without current pathological fracture: Secondary | ICD-10-CM | POA: Diagnosis not present

## 2016-08-24 MED FILL — ATORVASTATIN 20 MG TABLET: 20 | 90 days supply | Qty: 90 | Fill #0

## 2016-09-03 DIAGNOSIS — Z1212 Encounter for screening for malignant neoplasm of rectum: Secondary | ICD-10-CM | POA: Diagnosis not present

## 2016-09-14 DIAGNOSIS — M9907 Segmental and somatic dysfunction of upper extremity: Secondary | ICD-10-CM | POA: Diagnosis not present

## 2016-09-14 DIAGNOSIS — M5383 Other specified dorsopathies, cervicothoracic region: Secondary | ICD-10-CM | POA: Diagnosis not present

## 2016-09-14 DIAGNOSIS — M9901 Segmental and somatic dysfunction of cervical region: Secondary | ICD-10-CM | POA: Diagnosis not present

## 2016-09-27 DIAGNOSIS — M25541 Pain in joints of right hand: Secondary | ICD-10-CM | POA: Diagnosis not present

## 2016-09-29 NOTE — Progress Notes (Signed)
[facility] [place], [place] [address] Phone: [phone] Subjective:    CC: Back pain Right-sided Right-sided finger pain  [id number]  Mary Potter is a 70 y.o. female coming in with complaint of back pain. Started about 10 days ago. Patient states that the only thing that has occurred and she did change her work positioning. Patient is having to step down from a higher chair repetitively on the right side. Patient states that all the sudden she started having right sided hip and back pain. States that it has been severe. States that after laying down for long amount of time or sitting for long amount of time severe pain on the lateral aspect the leg. When she starts moving seems to get a little bit better. Can be painful to touch. Denies any numbness, denies any weakness. Rates the severity of pain though is 8 out of 10. Not responding to over-the-counter medications. Does get better somewhat with rest but then is significantly worse when she starts activity again.   right middle finger pain. States it gets stuck in a flexed position. Thinks it occurred after trying to catch a plant that was falling. Patient states now it seems to be worsening. If she keeps a fist for long amount of time very difficult to extend her middle finger. No swelling, no numbness.     Past Medical History:  Diagnosis Date  . 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 Hyperlipidemia   . PONV (postoperative nausea and vomiting)   . Thickened endometrium 01/03/2015  . Vaginal delivery 1976, 1985   Past Surgical History:  Procedure Laterality Date  . ANKLE FRACTURE SURGERY    . DILATION AND CURETTAGE OF UTERUS  2012   ablation   . HYSTEROSCOPY W/D&C N/A 01/03/2015   Procedure: DILATATION AND CURETTAGE /HYSTEROSCOPY;  Surgeon: [name], MD;  Location: [facility];  Service: Gynecology;  Laterality: N/A;  . TONSILLECTOMY AND ADENOIDECTOMY     Social History   Social  History  . Marital status: Widowed    Spouse name: [name]  . Number of children: 2  . Years of education: 14   Occupational History  . Clerical Team Lead     [employer]   Social History Main Topics  . Smoking status: Never Smoker  . Smokeless tobacco: Never Used  . Alcohol use No  . Drug use: No  . Sexual activity: Not Asked   Other Topics Concern  . None   Social History Narrative   Lives with her younger daughter, [name]. Her older daughter lives in [place] with her family.   Allergies  Allergen Reactions  . Talwin [Pentazocine] Anaphylaxis   Family History  Problem Relation Age of Onset  . Heart disease Mother   . Stroke Father   . Heart disease Brother 46  . Heart disease Maternal Grandmother   . Cancer Maternal Grandfather   . Heart disease Paternal Grandmother   . Cancer Paternal Grandfather   . Heart disease Brother   . Heart disease Brother   . Heart disease Brother   . Hypertension Sister     Past medical history, social, surgical and family history all reviewed in electronic medical record.  No pertanent information unless stated regarding to the chief complaint.   Review of Systems:Review of systems updated and as accurate as of 09/30/16  No headache, visual changes, nausea, vomiting, diarrhea, constipation, dizziness, abdominal pain, skin rash, fevers, chills, night sweats, weight loss, swollen lymph  nodes, body aches, joint swelling, muscle aches, chest pain, shortness of breath, mood changes.   Objective  Blood pressure (!) 142/88, pulse 86, height 5' 4.5" (1.638 m), SpO2 97 %. Systems examined below as of 09/30/16   General: No apparent distress alert and oriented x3 mood and affect normal, dressed appropriately.  HEENT: Pupils equal, extraocular movements intact  Respiratory: Patient's speak in full sentences and does not appear short of breath  Cardiovascular: No lower extremity edema, non tender, no erythema  Skin: Warm dry intact with no signs  of infection or rash on extremities or on axial skeleton.  Abdomen: Soft nontender  Neuro: Cranial nerves II through XII are intact, neurovascularly intact in all extremities with 2+ DTRs and 2+ pulses.  Lymph: No lymphadenopathy of posterior or anterior cervical chain or axillae bilaterally.  Gait normal with good balance and coordination.  MSK:  Non tender with full range of motion and good stability and symmetric strength and tone of shoulders, elbows, wrist, hip, knee and ankles bilaterally.  Back Exam:  Inspection: Unremarkable  Motion: Flexion 40 deg, Extension 25 deg, Side Bending to 35 deg bilaterally,  Rotation to 35 deg bilaterally  SLR laying: Negative  XSLR laying: Negative  Palpable tenderness: Marland Kitchen Minimal tenderness to palpation in the right sacroiliac joint. FABER: positive on the right side patient does have pain over the iliotibial band on the right side going towards the right knee. Very tender to palpation even to light sensation.. Sensory change: Gross sensation intact to all lumbar and sacral dermatomes.  Reflexes: 2+ at both patellar tendons, 2+ at achilles tendons, Babinski's downgoing.  Strength at foot  Plantar-flexion: 5/5 Dorsi-flexion: 5/5 Eversion: 5/5 Inversion: 5/5  Leg strength  Quad: 5/5 Hamstring: 5/5 Hip flexor: 5/5 Hip abductors: 5/5  Gait unremarkable.  Hand exam shows the patient does have trigger nodule at the A1 pulley of the middle finger. Lastly intact. Mild triggering occurring with flexion. Full strength noted.  Procedure note After verbal consent patient was prepped with alcohol swab and with a 25-gauge half-inch needle was injected with a total of 0.5 mL of 0.5% Marcaine and 0.5 mL of Kenalog 40 mg/dL in the flexor tendon sheath. Tolerated the procedure well. Postinjection instructions given. Impression: Trigger finger   Impression and Recommendations:     This case required medical decision making of moderate complexity.      Note:  This dictation was prepared with Dragon dictation along with smaller phrase technology. Any transcriptional errors that result from this process are unintentional.

## 2016-09-30 ENCOUNTER — Ambulatory Visit (INDEPENDENT_AMBULATORY_CARE_PROVIDER_SITE_OTHER): Payer: 59 | Admitting: Family Medicine

## 2016-09-30 ENCOUNTER — Encounter: Payer: Self-pay | Admitting: Family Medicine

## 2016-09-30 DIAGNOSIS — M65331 Trigger finger, right middle finger: Secondary | ICD-10-CM | POA: Insufficient documentation

## 2016-09-30 DIAGNOSIS — M7631 Iliotibial band syndrome, right leg: Secondary | ICD-10-CM | POA: Diagnosis not present

## 2016-09-30 MED ORDER — MELOXICAM 15 MG PO TABS: 15.0000 mg | ORAL_TABLET | Freq: Every day | ORAL | 0 refills | Status: DC

## 2016-09-30 MED FILL — MELOXICAM 15 MG TABLET: 15 | 30 days supply | Qty: 30 | Fill #0

## 2016-09-30 NOTE — Assessment & Plan Note (Signed)
I believe the patient did have irritation of the iliotibial band. We discussed with patient at great length. Patient will try oral anti-inflammatories, icing, compression, we will see how patient response. Patient continues to have difficulty I do feel x-rays will be necessary.

## 2016-09-30 NOTE — Patient Instructions (Addendum)
Good to see you again ! Happy holidays!  Stay active but watch the lifting.  Tennis shoes will be better Injected finger and wear spint at night for next week.  For the hip Exercises 3 times a week.  Meloxicam daily for 10 days then as needed.  Ok your bike drop your seat.  See me again in 2-3 weeks to make sure you are better.

## 2016-09-30 NOTE — Assessment & Plan Note (Signed)
Patient given injection today and tolerated the procedure well. We discussed icing regimen and home exercises. Patient will do bracing at night. Patient will come back and see me again in 4 weeks for further evaluation.

## 2016-11-25 DIAGNOSIS — N8500 Endometrial hyperplasia, unspecified: Secondary | ICD-10-CM | POA: Diagnosis not present

## 2016-12-01 MED FILL — ESTRADIOL 0.025 MG PATCH: 0.025 | 84 days supply | Qty: 24 | Fill #0

## 2016-12-01 MED FILL — PROGESTERONE 100 MG CAPSULE: 100 | 90 days supply | Qty: 90 | Fill #0

## 2016-12-16 DIAGNOSIS — M720 Palmar fascial fibromatosis [Dupuytren]: Secondary | ICD-10-CM | POA: Diagnosis not present

## 2016-12-16 DIAGNOSIS — D2261 Melanocytic nevi of right upper limb, including shoulder: Secondary | ICD-10-CM | POA: Diagnosis not present

## 2016-12-16 DIAGNOSIS — L814 Other melanin hyperpigmentation: Secondary | ICD-10-CM | POA: Diagnosis not present

## 2016-12-16 DIAGNOSIS — C4441 Basal cell carcinoma of skin of scalp and neck: Secondary | ICD-10-CM | POA: Diagnosis not present

## 2016-12-16 DIAGNOSIS — D2262 Melanocytic nevi of left upper limb, including shoulder: Secondary | ICD-10-CM | POA: Diagnosis not present

## 2016-12-16 DIAGNOSIS — D2271 Melanocytic nevi of right lower limb, including hip: Secondary | ICD-10-CM | POA: Diagnosis not present

## 2016-12-16 DIAGNOSIS — D2272 Melanocytic nevi of left lower limb, including hip: Secondary | ICD-10-CM | POA: Diagnosis not present

## 2016-12-16 DIAGNOSIS — D1801 Hemangioma of skin and subcutaneous tissue: Secondary | ICD-10-CM | POA: Diagnosis not present

## 2016-12-16 DIAGNOSIS — L821 Other seborrheic keratosis: Secondary | ICD-10-CM | POA: Diagnosis not present

## 2016-12-23 DIAGNOSIS — H524 Presbyopia: Secondary | ICD-10-CM | POA: Diagnosis not present

## 2017-01-20 DIAGNOSIS — C4441 Basal cell carcinoma of skin of scalp and neck: Secondary | ICD-10-CM | POA: Diagnosis not present

## 2017-01-20 DIAGNOSIS — Z85828 Personal history of other malignant neoplasm of skin: Secondary | ICD-10-CM | POA: Diagnosis not present

## 2017-01-20 MED FILL — DOXYCYCLINE HYCLATE 100 MG: 100 | 5 days supply | Qty: 10 | Fill #0

## 2017-02-11 DIAGNOSIS — Z4802 Encounter for removal of sutures: Secondary | ICD-10-CM | POA: Diagnosis not present

## 2017-03-21 ENCOUNTER — Other Ambulatory Visit: Payer: Self-pay | Admitting: Family Medicine

## 2017-03-21 MED FILL — ESTRADIOL 0.025 MG PATCH: 0.025 | 84 days supply | Qty: 24 | Fill #1

## 2017-03-21 MED FILL — MELOXICAM 15 MG TABLET: 15 | 30 days supply | Qty: 30 | Fill #0

## 2017-03-21 MED FILL — ATORVASTATIN 20 MG TABLET: 20 | 90 days supply | Qty: 90 | Fill #1

## 2017-03-21 MED FILL — PROGESTERONE 100 MG CAPSULE: 100 | 90 days supply | Qty: 90 | Fill #1

## 2017-03-21 NOTE — Telephone Encounter (Signed)
Refill done.  

## 2017-05-18 DIAGNOSIS — Z79890 Hormone replacement therapy (postmenopausal): Secondary | ICD-10-CM | POA: Diagnosis not present

## 2017-05-18 DIAGNOSIS — Z8742 Personal history of other diseases of the female genital tract: Secondary | ICD-10-CM | POA: Diagnosis not present

## 2017-08-30 DIAGNOSIS — M810 Age-related osteoporosis without current pathological fracture: Secondary | ICD-10-CM | POA: Diagnosis not present

## 2017-08-30 DIAGNOSIS — Z0000 Encounter for general adult medical examination without abnormal findings: Secondary | ICD-10-CM | POA: Diagnosis not present

## 2017-08-30 DIAGNOSIS — R82998 Other abnormal findings in urine: Secondary | ICD-10-CM | POA: Diagnosis not present

## 2017-08-30 DIAGNOSIS — E7849 Other hyperlipidemia: Secondary | ICD-10-CM | POA: Diagnosis not present

## 2017-09-06 DIAGNOSIS — Z1389 Encounter for screening for other disorder: Secondary | ICD-10-CM | POA: Diagnosis not present

## 2017-09-06 DIAGNOSIS — R030 Elevated blood-pressure reading, without diagnosis of hypertension: Secondary | ICD-10-CM | POA: Diagnosis not present

## 2017-09-06 DIAGNOSIS — M810 Age-related osteoporosis without current pathological fracture: Secondary | ICD-10-CM | POA: Diagnosis not present

## 2017-09-06 DIAGNOSIS — E7849 Other hyperlipidemia: Secondary | ICD-10-CM | POA: Diagnosis not present

## 2017-09-06 DIAGNOSIS — Z0000 Encounter for general adult medical examination without abnormal findings: Secondary | ICD-10-CM | POA: Diagnosis not present

## 2017-09-06 DIAGNOSIS — Z6826 Body mass index (BMI) 26.0-26.9, adult: Secondary | ICD-10-CM | POA: Diagnosis not present

## 2017-09-06 DIAGNOSIS — Z8249 Family history of ischemic heart disease and other diseases of the circulatory system: Secondary | ICD-10-CM | POA: Diagnosis not present

## 2017-09-07 DIAGNOSIS — Z1212 Encounter for screening for malignant neoplasm of rectum: Secondary | ICD-10-CM | POA: Diagnosis not present

## 2017-09-14 DIAGNOSIS — R922 Inconclusive mammogram: Secondary | ICD-10-CM | POA: Diagnosis not present

## 2017-09-14 DIAGNOSIS — R921 Mammographic calcification found on diagnostic imaging of breast: Secondary | ICD-10-CM | POA: Diagnosis not present

## 2017-09-29 DIAGNOSIS — M810 Age-related osteoporosis without current pathological fracture: Secondary | ICD-10-CM | POA: Diagnosis not present

## 2017-09-29 MED FILL — ESTRADIOL 0.025 MG PATCH: 0.025 | 28 days supply | Qty: 8 | Fill #0

## 2017-09-29 MED FILL — PROGESTERONE 100 MG CAPSULE: 100 | 30 days supply | Qty: 30 | Fill #0

## 2017-09-29 MED FILL — ATORVASTATIN 20 MG TABLET: 20 | 90 days supply | Qty: 90 | Fill #0

## 2017-12-01 MED FILL — ESTRADIOL 0.025 MG PATCH: 0.025 | 84 days supply | Qty: 24 | Fill #1

## 2017-12-01 MED FILL — PROGESTERONE 100 MG CAPSULE: 100 | 30 days supply | Qty: 30 | Fill #1

## 2018-01-31 MED FILL — PROGESTERONE 100 MG CAPSULE: 100 | 30 days supply | Qty: 30 | Fill #2

## 2018-01-31 MED FILL — ATORVASTATIN 20 MG TABLET: 20 | 90 days supply | Qty: 90 | Fill #1

## 2018-01-31 MED FILL — MELOXICAM 15 MG TABLET: 15 | 30 days supply | Qty: 30 | Fill #1

## 2018-02-02 DIAGNOSIS — H524 Presbyopia: Secondary | ICD-10-CM | POA: Diagnosis not present

## 2018-02-20 DIAGNOSIS — H18453 Nodular corneal degeneration, bilateral: Secondary | ICD-10-CM | POA: Diagnosis not present

## 2018-02-20 DIAGNOSIS — H11011 Amyloid pterygium of right eye: Secondary | ICD-10-CM | POA: Diagnosis not present

## 2018-02-20 DIAGNOSIS — H25813 Combined forms of age-related cataract, bilateral: Secondary | ICD-10-CM | POA: Diagnosis not present

## 2018-02-20 MED FILL — DOXYCYCLINE HYCLATE 100 MG: 100 | 30 days supply | Qty: 30 | Fill #0

## 2018-02-20 MED FILL — MURO-128 2% EYE DROPS: 2 | 50 days supply | Qty: 15 | Fill #0

## 2018-02-21 MED FILL — ESTRADIOL 0.025 MG PATCH: 0.025 | 56 days supply | Qty: 16 | Fill #2

## 2018-03-20 NOTE — Progress Notes (Signed)
[facility] [address] [place], [place] [address] Phone: [phone] Subjective:     CC: Left leg pain  [id number]  Mary Potter is a 72 y.o. female coming in with complaint of left leg/hip pain. Cramps and gets sore. Recently started an antibiotic and she has noticed she cramps more. States she thinks that her leg has been swollen.   Onset- 2 weeks  Location- Butt (superficial) Duration- Worse in the morning Character- Crampy Aggravating factors- laying, sitting Reliving factors- Ice Therapies tried-only ice Severity-sometimes 9 out of 10     Past Medical History:  Diagnosis Date  . 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 Hyperlipidemia   . PONV (postoperative nausea and vomiting)   . Thickened endometrium 01/03/2015  . Vaginal delivery 1976, 1985   Past Surgical History:  Procedure Laterality Date  . ANKLE FRACTURE SURGERY    . DILATION AND CURETTAGE OF UTERUS  2012   ablation   . HYSTEROSCOPY W/D&C N/A 01/03/2015   Procedure: DILATATION AND CURETTAGE /HYSTEROSCOPY;  Surgeon: [name], MD;  Location: [facility];  Service: Gynecology;  Laterality: N/A;  . TONSILLECTOMY AND ADENOIDECTOMY     Social History   Socioeconomic History  . Marital status: Widowed    Spouse name: [name]  . Number of children: 2  . Years of education: 2  . Highest education level: Not on file  Occupational History  . Occupation: Radio producer Lead    Comment: [employer]  Social Needs  . Financial resource strain: Not on file  . Food insecurity:    Worry: Not on file    Inability: Not on file  . Transportation needs:    Medical: Not on file    Non-medical: Not on file  Tobacco Use  . Smoking status: Never Smoker  . Smokeless tobacco: Never Used  Substance and Sexual Activity  . Alcohol use: No    Alcohol/week: 0.0 oz  . Drug use: No  . Sexual activity: Not on file  Lifestyle  . Physical activity:    Days per week: Not on file    Minutes per  session: Not on file  . Stress: Not on file  Relationships  . Social connections:    Talks on phone: Not on file    Gets together: Not on file    Attends religious service: Not on file    Active member of club or organization: Not on file    Attends meetings of clubs or organizations: Not on file    Relationship status: Not on file  Other Topics Concern  . Not on file  Social History Narrative   Lives with her younger daughter, Mary Potter. Her older daughter lives in [place] with her family.   Allergies  Allergen Reactions  . Talwin [Pentazocine] Anaphylaxis   Family History  Problem Relation Age of Onset  . Heart disease Mother   . Stroke Father   . Heart disease Brother 1  . Heart disease Maternal Grandmother   . Cancer Maternal Grandfather   . Heart disease Paternal Grandmother   . Cancer Paternal Grandfather   . Heart disease Brother   . Heart disease Brother   . Heart disease Brother   . Hypertension Sister      Past medical history, social, surgical and family history all reviewed in electronic medical record.  No pertanent information unless stated regarding to the chief complaint.   Review of Systems:Review of systems updated and as accurate as of 03/21/18  No headache, visual changes, nausea, vomiting, diarrhea, constipation, dizziness, abdominal pain, skin rash, fevers, chills, night sweats, weight loss, swollen lymph nodes, body aches, joint swelling,  chest pain, shortness of breath, mood changes.  Positive muscle aches  Objective  Blood pressure (!) 150/84, pulse 75, height 5\' 4"  (1.626 m), weight 148 lb (67.1 kg), SpO2 96 %. Systems examined below as of 03/21/18   General: No apparent distress alert and oriented x3 mood and affect normal, dressed appropriately.  HEENT: Pupils equal, extraocular movements intact  Respiratory: Patient's speak in full sentences and does not appear short of breath  Cardiovascular: No lower extremity edema, non tender, no  erythema  Skin: Warm dry intact with no signs of infection or rash on extremities or on axial skeleton.  Abdomen: Soft nontender  Neuro: Cranial nerves II through XII are intact, neurovascularly intact in all extremities with 2+ DTRs and 2+ pulses.  Lymph: No lymphadenopathy of posterior or anterior cervical chain or axillae bilaterally.  Gait antalgic MSK:  Non tender with full range of motion and good stability and symmetric strength and tone of shoulders, elbows, wrist, hip, knee and ankles bilaterally.  Back Exam:  Inspection: Loss of lordosis Motion: Flexion 30 deg positive with radicular symptoms going on the left leg, Extension 25 deg, Side Bending to 35 deg bilaterally,  Rotation to 35 deg bilaterally  SLR laying: Positive left XSLR laying: Negative  Palpable tenderness: Tender to palpation over the paraspinal musculature lumbar spine mostly from L3-L5. FABER: Mild positive on the left. Sensory change: Gross sensation intact to all lumbar and sacral dermatomes.  Reflexes: 2+ at both patellar tendons, 2+ at achilles tendons, Babinski's downgoing.  Strength at foot  4-5 strength in the left compared to the right especially with dorsiflexion of the foot    Impression and Recommendations:     This case required medical decision making of moderate complexity.      Note: This dictation was prepared with Dragon dictation along with smaller phrase technology. Any transcriptional errors that result from this process are unintentional.

## 2018-03-21 ENCOUNTER — Ambulatory Visit (INDEPENDENT_AMBULATORY_CARE_PROVIDER_SITE_OTHER): Payer: 59 | Admitting: Family Medicine

## 2018-03-21 ENCOUNTER — Ambulatory Visit (INDEPENDENT_AMBULATORY_CARE_PROVIDER_SITE_OTHER)
Admission: RE | Admit: 2018-03-21 | Discharge: 2018-03-21 | Disposition: A | Discharge status: Home / Self Care | Payer: 59 | Source: Ambulatory Visit | Attending: Family Medicine | Admitting: Family Medicine

## 2018-03-21 ENCOUNTER — Encounter: Payer: Self-pay | Admitting: Family Medicine

## 2018-03-21 VITALS — BP 150/84 | HR 75 | Ht 64.0 in | Wt 148.0 lb

## 2018-03-21 DIAGNOSIS — M5416 Radiculopathy, lumbar region: Secondary | ICD-10-CM | POA: Diagnosis not present

## 2018-03-21 DIAGNOSIS — M25552 Pain in left hip: Secondary | ICD-10-CM | POA: Diagnosis not present

## 2018-03-21 MED ORDER — GABAPENTIN 100 MG PO CAPS: 200.0000 mg | ORAL_CAPSULE | Freq: Every day | ORAL | 3 refills | Status: DC

## 2018-03-21 MED ORDER — KETOROLAC TROMETHAMINE 60 MG/2ML IM SOLN
60.0000 mg | Freq: Once | INTRAMUSCULAR | Status: AC
  Administered 2018-03-21: 60 mg via INTRAMUSCULAR

## 2018-03-21 MED ORDER — PREDNISONE 50 MG PO TABS: 50.0000 mg | ORAL_TABLET | Freq: Every day | ORAL | 0 refills | Status: DC

## 2018-03-21 MED ORDER — METHYLPREDNISOLONE ACETATE 80 MG/ML IJ SUSP
80.0000 mg | Freq: Once | INTRAMUSCULAR | Status: AC
  Administered 2018-03-21: 80 mg via INTRAMUSCULAR

## 2018-03-21 MED FILL — GABAPENTIN 100 MG CAP: 100 | 30 days supply | Qty: 60 | Fill #0

## 2018-03-21 MED FILL — predniSONE 50 MG TABS: 50 | 5 days supply | Qty: 5 | Fill #0

## 2018-03-21 NOTE — Patient Instructions (Signed)
Good to see you  xrays downstairs I am sorry you are hurting  2 injections today  Gabapentin 200mg  at night start tonight Starting tomorrow prednisone daily for 5 days Ice 20 minutes 2 times daily. Usually after activity and before bed. See me again in 7-10 days

## 2018-03-21 NOTE — Assessment & Plan Note (Signed)
2 weeks and pain.  Mild weakness with some dorsiflexion of the foot.  Consistent with an S1 nerve impingement on the left side.  We discussed icing regimen, home exercise, which activities of doing which wants to avoid.  Patient is to increase activity as tolerated.  Gabapentin and prednisone given follow-up with me again in 7 to 10 days.

## 2018-03-23 DIAGNOSIS — H11011 Amyloid pterygium of right eye: Secondary | ICD-10-CM | POA: Diagnosis not present

## 2018-03-23 DIAGNOSIS — H25813 Combined forms of age-related cataract, bilateral: Secondary | ICD-10-CM | POA: Diagnosis not present

## 2018-03-23 DIAGNOSIS — H18453 Nodular corneal degeneration, bilateral: Secondary | ICD-10-CM | POA: Diagnosis not present

## 2018-03-28 ENCOUNTER — Encounter: Payer: Self-pay | Admitting: Family Medicine

## 2018-03-28 ENCOUNTER — Other Ambulatory Visit: Payer: Self-pay

## 2018-03-28 ENCOUNTER — Encounter (HOSPITAL_COMMUNITY): Payer: Self-pay

## 2018-03-28 ENCOUNTER — Emergency Department (HOSPITAL_COMMUNITY)
Admission: EM | Admit: 2018-03-28 | Discharge: 2018-03-28 | Disposition: A | Discharge status: Home / Self Care | Payer: 59 | Attending: Emergency Medicine | Admitting: Emergency Medicine

## 2018-03-28 ENCOUNTER — Ambulatory Visit (INDEPENDENT_AMBULATORY_CARE_PROVIDER_SITE_OTHER): Payer: 59 | Admitting: Family Medicine

## 2018-03-28 VITALS — BP 180/100 | HR 98 | Temp 98.1°F

## 2018-03-28 DIAGNOSIS — R531 Weakness: Secondary | ICD-10-CM | POA: Diagnosis not present

## 2018-03-28 DIAGNOSIS — R5383 Other fatigue: Secondary | ICD-10-CM | POA: Diagnosis not present

## 2018-03-28 DIAGNOSIS — R42 Dizziness and giddiness: Secondary | ICD-10-CM | POA: Diagnosis not present

## 2018-03-28 DIAGNOSIS — R350 Frequency of micturition: Secondary | ICD-10-CM | POA: Diagnosis not present

## 2018-03-28 DIAGNOSIS — R9431 Abnormal electrocardiogram [ECG] [EKG]: Secondary | ICD-10-CM

## 2018-03-28 DIAGNOSIS — I161 Hypertensive emergency: Secondary | ICD-10-CM | POA: Diagnosis not present

## 2018-03-28 DIAGNOSIS — R030 Elevated blood-pressure reading, without diagnosis of hypertension: Secondary | ICD-10-CM | POA: Insufficient documentation

## 2018-03-28 DIAGNOSIS — Z79899 Other long term (current) drug therapy: Secondary | ICD-10-CM | POA: Diagnosis not present

## 2018-03-28 DIAGNOSIS — I951 Orthostatic hypotension: Secondary | ICD-10-CM | POA: Diagnosis not present

## 2018-03-28 LAB — CBC WITH DIFFERENTIAL/PLATELET
Abs Immature Granulocytes: 0.1 10*3/uL (ref 0.0–0.1)
Basophils Absolute: 0 10*3/uL (ref 0.0–0.1)
Basophils Relative: 1 %
Eosinophils Absolute: 0.1 10*3/uL (ref 0.0–0.7)
Eosinophils Relative: 1 %
HCT: 42 % (ref 36.0–46.0)
Hemoglobin: 13.4 g/dL (ref 12.0–15.0)
Immature Granulocytes: 1 %
Lymphocytes Relative: 17 %
Lymphs Abs: 1.4 10*3/uL (ref 0.7–4.0)
MCH: 27.6 pg (ref 26.0–34.0)
MCHC: 31.9 g/dL (ref 30.0–36.0)
MCV: 86.6 fL (ref 78.0–100.0)
Monocytes Absolute: 0.4 10*3/uL (ref 0.1–1.0)
Monocytes Relative: 5 %
Neutro Abs: 6.2 10*3/uL (ref 1.7–7.7)
Neutrophils Relative %: 75 %
Platelets: 280 10*3/uL (ref 150–400)
RBC: 4.85 MIL/uL (ref 3.87–5.11)
RDW: 13.9 % (ref 11.5–15.5)
WBC: 8.2 10*3/uL (ref 4.0–10.5)

## 2018-03-28 LAB — GLUCOSE, POCT (MANUAL RESULT ENTRY): POC Glucose: 90 mg/dl (ref 70–99)

## 2018-03-28 LAB — COMPREHENSIVE METABOLIC PANEL WITH GFR
ALT: 22 U/L (ref 14–54)
AST: 20 U/L (ref 15–41)
Albumin: 3.8 g/dL (ref 3.5–5.0)
Alkaline Phosphatase: 47 U/L (ref 38–126)
Anion gap: 9 (ref 5–15)
BUN: 18 mg/dL (ref 6–20)
CO2: 28 mmol/L (ref 22–32)
Calcium: 9 mg/dL (ref 8.9–10.3)
Chloride: 104 mmol/L (ref 101–111)
Creatinine, Ser: 0.78 mg/dL (ref 0.44–1.00)
GFR calc Af Amer: >60 mL/min
GFR calc non Af Amer: >60 mL/min
Glucose, Bld: 108 mg/dL — ABNORMAL HIGH (ref 65–99)
Potassium: 4.1 mmol/L (ref 3.5–5.1)
Sodium: 141 mmol/L (ref 135–145)
Total Bilirubin: 0.5 mg/dL (ref 0.3–1.2)
Total Protein: 7.1 g/dL (ref 6.5–8.1)

## 2018-03-28 LAB — URINALYSIS, ROUTINE W REFLEX MICROSCOPIC
Bacteria, UA: NONE SEEN
Bilirubin Urine: NEGATIVE
Glucose, UA: NEGATIVE mg/dL
Ketones, ur: NEGATIVE mg/dL
Leukocytes, UA: NEGATIVE
Nitrite: NEGATIVE
Protein, ur: NEGATIVE mg/dL
Specific Gravity, Urine: 1.004 — ABNORMAL LOW (ref 1.005–1.030)
pH: 7 (ref 5.0–8.0)

## 2018-03-28 LAB — POCT CBC
Granulocyte percent: 62.7 % (ref 37–80)
HCT, POC: 45.7 % (ref 37.7–47.9)
Hemoglobin: 14.7 g/dL (ref 12.2–16.2)
Lymph, poc: 2.5 (ref 0.6–3.4)
MCH, POC: 28 pg (ref 27–31.2)
MCHC: 32.1 g/dL (ref 31.8–35.4)
MCV: 87.2 fL (ref 80–97)
MID (cbc): 1 — AB (ref 0–0.9)
MPV: 7.2 fL (ref 0–99.8)
POC Granulocyte: 5.9 (ref 2–6.9)
POC LYMPH PERCENT: 27 % (ref 10–50)
POC MID %: 10.3 % (ref 0–12)
Platelet Count, POC: 325 10*3/uL (ref 142–424)
RBC: 5.24 M/uL (ref 4.04–5.48)
RDW, POC: 14.7 %
WBC: 9.4 10*3/uL (ref 4.6–10.2)

## 2018-03-28 LAB — I-STAT TROPONIN, ED: Troponin i, poc: 0 ng/mL (ref 0.00–0.08)

## 2018-03-28 MED ORDER — SODIUM CHLORIDE 0.9 % IV BOLUS
1000.0000 mL | Freq: Once | INTRAVENOUS | Status: AC
Start: 2018-03-28 — End: 2018-03-28
  Administered 2018-03-28: 1000 mL via INTRAVENOUS

## 2018-03-28 MED ORDER — SODIUM CHLORIDE 0.9 % IV BOLUS
1000.0000 mL | Freq: Once | INTRAVENOUS | Status: AC
  Administered 2018-03-28: 1000 mL via INTRAVENOUS

## 2018-03-28 NOTE — ED Triage Notes (Signed)
Pt arrived via [facility]. Pt transported from work Primary Care at [employer]. Pt has sudden onset of generalized weakness, mostly BLE with dizziness, BP at [facility] was 200/113. Per EMS Pt c/o chest heaviness upon ED arrival. Pt has hx of Rx  Prednisone and GPN for past 6 days.

## 2018-03-28 NOTE — ED Provider Notes (Signed)
[facility] EMERGENCY DEPARTMENT Provider Note   CSN: [id number] Arrival date & time: 03/28/18  1148     History   Chief Complaint Chief Complaint  Patient presents with  . Weakness  . Dizziness  . Hypertension    HPI Mary Potter is a 72 y.o. female who presents the emergency department with chief complaint of weakness.  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.  The patient recently had an issue with left-sided sciatica.  She was seen by sports medicine physician Dr. Gardenia Phlegm on 03/21/2018.  She was given 80 mg IM Depo-Medrol and 60 IM Toradol.  She has been on a burst of 50 mg prednisone p.o. daily and took her last dose on Sunday.  She states while her symptoms of left leg burning and numbness with mild left ankle weakness have improved however she has felt "worse and worse" over the past week with generalized weakness.  She states that she has felt lightheaded with foggy concentration.  She denies vertiginous symptoms, ataxia.  She has been able to ambulate but states that sometimes she feels like she has to write herself when she stands.  She has had increased urinary frequency increased thirst and hunger mild swelling in her ankles which she has attributed to use of the steroid but she denies burning with urination, urgency, back pain, hematuria or other signs of infection.  This morning at work she was feeling the worst she has felt all week.  She states that when she stood up she became extremely weak in her extremities and felt "heavy everywhere like I had been running really hard."  She was urged to check in and then sent to the emergency department.  She is able to ambulate.  She did feel that she was going to pass out earlier.  She denies chest pain, shortness of breath, palpitations.  HPI  Past Medical History:  Diagnosis Date  . 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 Hyperlipidemia   . PONV (postoperative nausea and vomiting)     . Thickened endometrium 01/03/2015  . Vaginal delivery 1976, 1985    Patient Active Problem List   Diagnosis Date Noted  . Acute left lumbar radiculopathy 03/21/2018  . Iliotibial band tendinitis of right side 09/30/2016  . Trigger finger, right middle finger 09/30/2016  . Thickened endometrium 01/03/2015  . 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 H/O ankle fusion 05/09/2014  . Left leg pain 04/17/2014  . Lipid disorder 11/14/2012  . GERD 09/11/2010  . CONSTIPATION 09/11/2010  . DIARRHEA 09/11/2010    Past Surgical History:  Procedure Laterality Date  . ANKLE FRACTURE SURGERY    . DILATION AND CURETTAGE OF UTERUS  2012   ablation   . HYSTEROSCOPY W/D&C N/A 01/03/2015   Procedure: DILATATION AND CURETTAGE /HYSTEROSCOPY;  Surgeon: [name], MD;  Location: [facility];  Service: Gynecology;  Laterality: N/A;  . TONSILLECTOMY AND ADENOIDECTOMY       OB History   None      Home Medications    Prior to Admission medications   Medication Sig Start Date End Date Taking? Authorizing Provider  atorvastatin (LIPITOR) 20 MG tablet Take 1 tablet (20 mg total) by mouth daily. Patient taking differently: Take 20 mg by mouth 3 (three) times a week.  10/07/14  Yes Jeffery, Chelle, PA-C  cholecalciferol (VITAMIN D) 1000 UNITS tablet Take 1,000 Units by mouth 3 (three) times a week.  Yes [provider]  estradiol (VIVELLE-DOT) 0.025 MG/24HR Place 1 patch onto the skin 2 (two) times a week. 02/21/18  Yes [provider]  gabapentin (NEURONTIN) 100 MG capsule Take 2 capsules (200 mg total) by mouth at bedtime. 03/21/18  Yes Lyndal Pulley, DO  progesterone (PROMETRIUM) 100 MG capsule Take 100 mg by mouth 3 (three) times a week. 01/31/18  Yes [provider]  meloxicam (MOBIC) 15 MG tablet TAKE 1 TABLET BY MOUTH ONCE DAILY Patient not taking: Reported on 03/28/2018 03/21/17   Lyndal Pulley, DO  predniSONE (DELTASONE) 50 MG  tablet Take 1 tablet (50 mg total) by mouth daily. Patient not taking: Reported on 03/28/2018 03/21/18   Lyndal Pulley, DO    Family History Family History  Problem Relation Age of Onset  . Heart disease Mother   . Stroke Father   . Heart disease Brother 79  . Heart disease Maternal Grandmother   . Cancer Maternal Grandfather   . Heart disease Paternal Grandmother   . Cancer Paternal Grandfather   . Heart disease Brother   . Heart disease Brother   . Heart disease Brother   . Hypertension Sister     Social History Social History   Tobacco Use  . Smoking status: Never Smoker  . Smokeless tobacco: Never Used  Substance Use Topics  . Alcohol use: No    Alcohol/week: 0.0 oz  . Drug use: No     Allergies   Talwin [pentazocine]   Review of Systems Review of Systems  Ten systems reviewed and are negative for acute change, except as noted in the HPI.  Physical Exam Updated Vital Signs BP 133/86   Pulse 71   Temp 98.1 F (36.7 C) (Oral)   Resp 15   SpO2 99%   Physical Exam  Constitutional: She is oriented to person, place, and time. She appears well-developed and well-nourished. No distress.  HENT:  Head: Normocephalic and atraumatic.  Mouth/Throat: Oropharynx is clear and moist.  Eyes: Pupils are equal, round, and reactive to light. Conjunctivae and EOM are normal. No scleral icterus.  No horizontal, vertical or rotational nystagmus  Neck: Normal range of motion. Neck supple.  Cardiovascular: Normal rate, regular rhythm and intact distal pulses.  Pulmonary/Chest: Effort normal and breath sounds normal. No respiratory distress. She has no wheezes. She has no rales.  Abdominal: Soft. Bowel sounds are normal. There is no tenderness. There is no rebound and no guarding.  Musculoskeletal: Normal range of motion.  Lymphadenopathy:    She has no cervical adenopathy.  Neurological: She is alert and oriented to person, place, and time. No cranial nerve deficit. She  exhibits normal muscle tone. Coordination normal.  Mental Status:  Alert, oriented, thought content appropriate. Speech fluent without evidence of aphasia. Able to follow 2 step commands without difficulty.  Cranial Nerves:  II:  Peripheral visual fields grossly normal, pupils equal, round, reactive to light III,IV, VI: ptosis not present, extra-ocular motions intact bilaterally  V,VII: smile symmetric, facial light touch sensation equal VIII: hearing grossly normal bilaterally  IX,X: midline uvula rise  XI: bilateral shoulder shrug equal and strong XII: midline tongue extension  Motor:  5/5 in proximal upper and lower extremities bilaterally including strong and equal grip strength 4/5 strength with plantar flexion of the left ankle Sensory: Pinprick and light touch normal in all extremities.  Cerebellar: normal finger-to-nose with bilateral upper extremities CV: distal pulses palpable throughout   Skin: Skin is warm and dry.  No rash noted. She is not diaphoretic.  Psychiatric: She has a normal mood and affect. Her behavior is normal. Judgment and thought content normal.  Nursing note and vitals reviewed.   . ED Treatments / Results  Labs (all labs ordered are listed, but only abnormal results are displayed) Labs Reviewed  COMPREHENSIVE METABOLIC PANEL - Abnormal; Notable for the following components:      Result Value   Glucose, Bld 108 (*)    All other components within normal limits  URINALYSIS, ROUTINE W REFLEX MICROSCOPIC - Abnormal; Notable for the following components:   Color, Urine COLORLESS (*)    Specific Gravity, Urine 1.004 (*)    Hgb urine dipstick SMALL (*)    All other components within normal limits  URINE CULTURE  CBC WITH DIFFERENTIAL/PLATELET  CBG MONITORING, ED  I-STAT TROPONIN, ED    EKG EKG Interpretation  Date/Time:  Tuesday Mar 28 2018 11:56:54 EDT Ventricular Rate:  83 PR Interval:    QRS Duration: 94 QT Interval:  391 QTC  Calculation: 460 R Axis:   -43 Text Interpretation:  Sinus rhythm Left anterior fascicular block Left ventricular hypertrophy Anterior Q waves, possibly due to LVH Baseline wander in lead(s) V5 Confirmed by Dene Gentry [phone]) on 03/28/2018 12:53:35 PM   Radiology No results found.  Procedures Procedures (including critical care time)  Medications Ordered in ED Medications  sodium chloride 0.9 % bolus 1,000 mL (0 mLs Intravenous Stopped 03/28/18 1658)     Initial Impression / Assessment and Plan / ED Course  I have reviewed the triage vital signs and the nursing notes.  Pertinent labs & imaging results that were available during my care of the patient were reviewed by me and considered in my medical decision making (see chart for details).  Clinical Course as of Mar 28 2228  Tue Mar 28, 2018  1235 Ddx includes steroid induced DM, Adrenal suppression, Dehydration,  Less likely MG. No unilateral weakness other than what is documented  or vertigo to suggest central cause which has improved with steroid.     [AH]  1359 EKG without arrhythmia or signs of acute ischemia.   [AH]  6045 Patient's BP + for orthostasis. Patient has received 1 L of fluid and will receive a 2nd liter. Then recheck vitals.   [AH]    Clinical Course User Index [AH] Margarita Mail, PA-C    Patient orthostatics improved significantly with 2 L of fluid.  She does not feel weak or lightheaded with standing anymore.  I suspect that these symptoms are secondary to discontinuation of her high dose steroid burst over the past 6 days.  I doubt adrenal insufficiency she does not have any evidence of hyponatremia her symptoms did improve with fluid rehydration.  It has been excessively hot this weekend in the mid 90s which may have contributed to some dehydration.  No evidence of prednisone induced diabetes.  Patient will be discharged to follow with her PCP.  Her blood pressure has normalized.  She appears appropriate  for discharge at this time  Final Clinical Impressions(s) / ED Diagnoses   Final diagnoses:  Orthostatic hypotension  Elevated blood pressure reading    ED Discharge Orders    None       Margarita Mail, PA-C 03/28/18 2229    Valarie Merino, MD 03/28/18 2250

## 2018-03-28 NOTE — Progress Notes (Addendum)
Subjective:  By signing my name below, I, Mary Potter, attest that this documentation has been prepared under the direction and in the presence of Merri Ray, MD. Electronically Signed: Moises Potter, [place]. 03/28/2018 , 11:05 AM .  Patient was seen in Room 7 .   Patient ID: Mary Potter, female    DOB: Mar 08, 1946, 73 y.o.   MRN: [id number] No chief complaint on file.  HPI Mary Potter is a 72 y.o. female   Advised by staff member of her condition at check-in.  She appeared fatigued, near syncopal but was responding to questioning, assisted back to room 7.   She states feeling fatigue with dizziness and shaky today. She reports taking gabapentin and prednisone 50mg  for past 6 days for disc issues. She denies history of heartburn, diabetes, or BP issue; although, she notes usually having white coat HTN. She takes atorvastatin for hyperlipidemia. She denies history of heart issues. She denies having headaches.   She notes she hasn't been feeling great since she started her medications. She had some stomach pain last night, but had improved. She denies dark tarry stools. She felt weak in both legs this morning, left slightly worse than right. She wasn't feeling great this morning at 5:55AM. She denies chest pain or shortness of breath. She has felt more thirsty this morning. She denies history of anemia.   Patient Active Problem List   Diagnosis Date Noted  . Acute left lumbar radiculopathy 03/21/2018  . Iliotibial band tendinitis of right side 09/30/2016  . Trigger finger, right middle finger 09/30/2016  . Thickened endometrium 01/03/2015  . 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 H/O ankle fusion 05/09/2014  . Left leg pain 04/17/2014  . Lipid disorder 11/14/2012  . GERD 09/11/2010  . CONSTIPATION 09/11/2010  . DIARRHEA 09/11/2010   Past Medical History:  Diagnosis Date  . 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  Hyperlipidemia   . PONV (postoperative nausea and vomiting)   . Thickened endometrium 01/03/2015  . Vaginal delivery 1976, 1985   Past Surgical History:  Procedure Laterality Date  . ANKLE FRACTURE SURGERY    . DILATION AND CURETTAGE OF UTERUS  2012   ablation   . HYSTEROSCOPY W/D&C N/A 01/03/2015   Procedure: DILATATION AND CURETTAGE /HYSTEROSCOPY;  Surgeon: [name], MD;  Location: [facility];  Service: Gynecology;  Laterality: N/A;  . TONSILLECTOMY AND ADENOIDECTOMY     Allergies  Allergen Reactions  . Talwin [Pentazocine] Anaphylaxis   Prior to Admission medications   Medication Sig Start Date End Date Taking? Authorizing Provider  atorvastatin (LIPITOR) 20 MG tablet Take 1 tablet (20 mg total) by mouth daily. Patient taking differently: Take 10 mg by mouth 3 (three) times a week.  10/07/14   Harrison Mons, PA-C  cholecalciferol (VITAMIN D) 1000 UNITS tablet Take 1,000 Units by mouth daily.    [provider]  gabapentin (NEURONTIN) 100 MG capsule Take 2 capsules (200 mg total) by mouth at bedtime. 03/21/18   Lyndal Pulley, DO  medroxyPROGESTERone (PROVERA) 10 MG tablet Take 10 mg by mouth daily. Take for 14 days of every other month 08/13/14   [provider]  meloxicam (MOBIC) 15 MG tablet TAKE 1 TABLET BY MOUTH ONCE DAILY 03/21/17   Lyndal Pulley, DO  misoprostol (CYTOTEC) 200 MCG tablet Place 200 mcg vaginally once. 01/02/15   [provider]  predniSONE (DELTASONE) 50 MG tablet Take 1 tablet (50 mg total) by mouth daily. 03/21/18  Lyndal Pulley, DO   Social History   Socioeconomic History  . Marital status: Widowed    Spouse name: [name]  . Number of children: 2  . Years of education: 73  . Highest education level: Not on file  Occupational History  . Occupation: Radio producer Lead    Comment: [employer]  Social Needs  . Financial resource strain: Not on file  . Food insecurity:    Worry: Not on file    Inability: Not on file  .  Transportation needs:    Medical: Not on file    Non-medical: Not on file  Tobacco Use  . Smoking status: Never Smoker  . Smokeless tobacco: Never Used  Substance and Sexual Activity  . Alcohol use: No    Alcohol/week: 0.0 oz  . Drug use: No  . Sexual activity: Not on file  Lifestyle  . Physical activity:    Days per week: Not on file    Minutes per session: Not on file  . Stress: Not on file  Relationships  . Social connections:    Talks on phone: Not on file    Gets together: Not on file    Attends religious service: Not on file    Active member of club or organization: Not on file    Attends meetings of clubs or organizations: Not on file    Relationship status: Not on file  . Intimate partner violence:    Fear of current or ex partner: Not on file    Emotionally abused: Not on file    Physically abused: Not on file    Forced sexual activity: Not on file  Other Topics Concern  . Not on file  Social History Narrative   Lives with her younger daughter, [name]. Her older daughter lives in [place] with her family.    Review of Systems  Constitutional: Positive for fatigue. Negative for fever and unexpected weight change.  Respiratory: Negative for cough.   Gastrointestinal: Negative for Potter in stool, constipation, diarrhea, nausea and vomiting.  Skin: Negative for rash and wound.  Neurological: Positive for dizziness and light-headedness. Negative for headaches.       Objective:   Physical Exam  Constitutional: She is oriented to person, place, and time. She appears well-developed and well-nourished. No distress.  HENT:  Head: Normocephalic and atraumatic.  Eyes: Pupils are equal, round, and reactive to light. EOM are normal.  Neck: Neck supple.  Cardiovascular: Normal rate.  Pulmonary/Chest: Effort normal. No respiratory distress.  Musculoskeletal: Normal range of motion.  Neurological: She is alert and oriented to person, place, and time.  No focal  weakness with grip strength or focal weakness in legs, able to repeat phrase "you can't teach an old dog new tricks", minimal movement on the left but no true drift  Skin: Skin is warm and dry.  Psychiatric: She has a normal mood and affect. Her behavior is normal.  Nursing note and vitals reviewed.   Vitals:   03/28/18 1114 03/28/18 1115  BP: (!) 200/113 (!) 180/100  Pulse: 98 98  Temp:  98.1 F (36.7 C)  TempSrc:  Oral  SpO2: 100%     [11:08 AM] EMS called for transport.  EKG: sinus rhythm, possible LAFB, with ST depression v4-v6.  [11:11 AM] 180/100 on repeat BP testing [11:12 AM] placed on heart monitor, cardiac sinus rhythm 84 [11:12 AM] IV on right AC, 20 gauge normal saline [11:18 AM] report given to EMS, transfer of care [11:18  AM] repeat testing by EMS, 180/90   Results for orders placed or performed in visit on 03/28/18  POCT CBC  Result Value Ref Range   WBC 9.4 4.6 - 10.2 K/uL   Lymph, poc 2.5 0.6 - 3.4   POC LYMPH PERCENT 27.0 10 - 50 %L   MID (cbc) 1.0 (A) 0 - 0.9   POC MID % 10.3 0 - 12 %M   POC Granulocyte 5.9 2 - 6.9   Granulocyte percent 62.7 37 - 80 %G   RBC 5.24 4.04 - 5.48 M/uL   Hemoglobin 14.7 12.2 - 16.2 g/dL   HCT, POC 45.7 37.7 - 47.9 %   MCV 87.2 80 - 97 fL   MCH, POC 28.0 27 - 31.2 pg   MCHC 32.1 31.8 - 35.4 g/dL   RDW, POC 14.7 %   Platelet Count, POC 325 142 - 424 K/uL   MPV 7.2 0 - 99.8 fL  POCT glucose (manual entry)  Result Value Ref Range   POC Glucose 90 70 - 99 mg/dl       Assessment & Plan:   TULANI KIDNEY is a 72 y.o. female Dizziness - Plan: EKG 12-Lead, POCT CBC, POCT glucose (manual entry), Insert peripheral IV, sodium chloride 0.9 % bolus 1,000 mL, CANCELED: Glucose (CBG)  Fatigue, unspecified type - Plan: EKG 12-Lead, POCT CBC, POCT glucose (manual entry), Insert peripheral IV, sodium chloride 0.9 % bolus 1,000 mL, CANCELED: Glucose (CBG)  Hypertensive emergency  Nonspecific abnormal electrocardiogram (ECG)  (EKG)  Acute onset of fatigue, generalized weakness and malaise starting approximately 5:55 AM when she woke up.  Has been taking prednisone and gabapentin for the past 1 week for low back issue and possible left radiculopathy.  However acute change in symptoms this morning as above.  Denies headache, denies chest pain, but significant elevated Potter pressure on initial evaluation concerning for hypertensive emergency.  There is no focal weakness appreciated exam, no pronator drift.  EKG with a few possible abnormalities including precordial leads that may have slight ST depression compared to previous EKG.  -Placed on monitor, IV placed for access-, EMS called for emergent transport as above.  -Glucose normal, CBC reassuring.  -Further evaluation through ER today.  11:26 AM discussed with charge nurse at [facility] ER.   Meds ordered this encounter  Medications  . sodium chloride 0.9 % bolus 1,000 mL   There are no Patient Instructions on file for this visit. I personally performed the services described in this documentation, which was scribed in my presence. The recorded information has been reviewed and considered for accuracy and completeness, addended by me as needed, and agree with information above.  Signed,   Merri Ray, MD [facility] [place].  03/28/18 11:22 AM

## 2018-03-28 NOTE — Discharge Instructions (Addendum)
Contact a health care provider if: You vomit. You have diarrhea. You have a fever for more than 2-3 days. You feel more thirsty than usual. You feel weak and tired. Get help right away if: You have chest pain. You have a fast or irregular heartbeat. You develop numbness in any part of your body. You cannot move your arms or your legs. You have trouble speaking. You become sweaty or feel lightheaded. You faint. You feel short of breath. You have trouble staying awake. You feel confused.

## 2018-03-29 ENCOUNTER — Telehealth: Payer: Self-pay | Admitting: Family Medicine

## 2018-03-29 LAB — URINE CULTURE: Culture: NO GROWTH

## 2018-03-29 NOTE — Telephone Encounter (Signed)
Patient needs Dr Carlota Raspberry to write a letter for her stating that due to her illness she needs to opt out of going on her [place] trip with [employer]. Patient states that her flared up disc is not better and she doesn't think she can go on trip due to this. Patient also states that if she does get better before hand she will go but wants the option just in case it doesn't.  Letter needs to be written to [employer] Attn: Johnnye Lana at [address] [place] [address].

## 2018-03-29 NOTE — Progress Notes (Signed)
[facility] [address], [place] [address] Phone: [phone] Subjective:      CC: Back pain follow-up  YPP:[id number]  Mary Potter is a 72 y.o. female coming in with complaint of back pain.  Patient was seen previously and had more of a left lumbar radiculopathy.  Significant amount of pain.  Started prednisone.  Unfortunately had a hypertensive urgency and then was found to have orthostatic hypotension.  Patient was sent to the emergency room where patient had 2 L of fluid and then was released.  No follow-up associated except for primary care in the next week.  Patient states that she is feeling better now that she has been off the prednisone.  Still having some difficulty with control of the blood pressure.  Sometimes feels like her heart is doing some fluttering.  Patient was initially seen at the urgent care and I did review patient's ECG that showed some chronic abnormal anterior lead inversions but fairly nonspecific.     Past Medical History:  Diagnosis Date  . 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 Hyperlipidemia   . PONV (postoperative nausea and vomiting)   . Thickened endometrium 01/03/2015  . Vaginal delivery 1976, 1985   Past Surgical History:  Procedure Laterality Date  . ANKLE FRACTURE SURGERY    . DILATION AND CURETTAGE OF UTERUS  2012   ablation   . HYSTEROSCOPY W/D&C N/A 01/03/2015   Procedure: DILATATION AND CURETTAGE /HYSTEROSCOPY;  Surgeon: [name], MD;  Location: [facility];  Service: Gynecology;  Laterality: N/A;  . TONSILLECTOMY AND ADENOIDECTOMY     Social History   Socioeconomic History  . Marital status: Widowed    Spouse name: [name]  . Number of children: 2  . Years of education: 58  . Highest education level: Not on file  Occupational History  . Occupation: Radio producer Lead    Comment: [employer]  Social Needs  . Financial resource strain: Not on file  . Food insecurity:    Worry: Not on file   Inability: Not on file  . Transportation needs:    Medical: Not on file    Non-medical: Not on file  Tobacco Use  . Smoking status: Never Smoker  . Smokeless tobacco: Never Used  Substance and Sexual Activity  . Alcohol use: No    Alcohol/week: 0.0 oz  . Drug use: No  . Sexual activity: Not on file  Lifestyle  . Physical activity:    Days per week: Not on file    Minutes per session: Not on file  . Stress: Not on file  Relationships  . Social connections:    Talks on phone: Not on file    Gets together: Not on file    Attends religious service: Not on file    Active member of club or organization: Not on file    Attends meetings of clubs or organizations: Not on file    Relationship status: Not on file  Other Topics Concern  . Not on file  Social History Narrative   Lives with her younger daughter, [name]. Her older daughter lives in [place] with her family.   Allergies  Allergen Reactions  . Talwin [Pentazocine] Anaphylaxis   Family History  Problem Relation Age of Onset  . Heart disease Mother   . Stroke Father   . Heart disease Brother 56  . Heart disease Maternal Grandmother   . Cancer Maternal Grandfather   . Heart disease Paternal Grandmother   .  Cancer Paternal Grandfather   . Heart disease Brother   . Heart disease Brother   . Heart disease Brother   . Hypertension Sister      Past medical history, social, surgical and family history all reviewed in electronic medical record.  No pertanent information unless stated regarding to the chief complaint.   Review of Systems:Review of systems updated and as accurate as of 03/30/18  No headache, visual changes, nausea, vomiting, diarrhea, constipation, dizziness, abdominal pain, skin rash, fevers, chills, night sweats, weight loss, swollen lymph nodes, body aches, joint swelling, hest pain, shortness of breath, mood changes.  Positive muscle aches denies chest pain  Objective  Blood pressure (!) 142/80,  pulse (!) 104, height 5\' 4"  (1.626 m), weight 154 lb (69.9 kg), SpO2 96 %. Systems examined below as of 03/30/18   General: No apparent distress alert and oriented x3 mood and affect normal, dressed appropriately.  HEENT: Pupils equal, extraocular movements intact  Respiratory: Patient's speak in full sentences and does not appear short of breath  Cardiovascular: No lower extremity edema, non tender, no erythema  Skin: Warm dry intact with no signs of infection or rash on extremities or on axial skeleton.  Abdomen: Soft nontender  Neuro: Cranial nerves II through XII are intact, neurovascularly intact in all extremities with 2+ DTRs and 2+ pulses.  Lymph: No lymphadenopathy of posterior or anterior cervical chain or axillae bilaterally.  Gait normal with good balance and coordination.  MSK:  Non tender with full range of motion and good stability and symmetric strength and tone of shoulders, elbows, wrist, hip, knee and ankles bilaterally.   Back Exam:  Inspection: Loss of lordosis Motion: Flexion 35 deg, Extension 25 deg, Side Bending to 35 deg bilaterally,  Rotation to 35 deg bilaterally  SLR laying: Negative significant tightness on the left side XSLR laying: Negative  Palpable tenderness: Tender to palpation of the paraspinal musculature lumbar spine right greater than left. FABER: Positive Faber. Sensory change: Gross sensation intact to all lumbar and sacral dermatomes.  Reflexes: 2+ at both patellar tendons, 2+ at achilles tendons, Babinski's downgoing.  Strength at foot  Plantar-flexion: 5/5 Dorsi-flexion: 5/5 Eversion: 5/5 Inversion: 5/5  Leg strength  Quad: 5/5 Hamstring: 5/5 Hip flexor: 5/5 Hip abductors: 4/5 but is symmetric Gait unremarkable.      Impression and Recommendations:     This case required medical decision making of moderate complexity.      Note: This dictation was prepared with Dragon dictation along with smaller phrase technology. Any  transcriptional errors that result from this process are unintentional.

## 2018-03-30 ENCOUNTER — Encounter: Payer: Self-pay | Admitting: Family Medicine

## 2018-03-30 ENCOUNTER — Ambulatory Visit (INDEPENDENT_AMBULATORY_CARE_PROVIDER_SITE_OTHER): Payer: 59 | Admitting: Family Medicine

## 2018-03-30 VITALS — BP 142/80 | HR 104 | Ht 64.0 in | Wt 154.0 lb

## 2018-03-30 DIAGNOSIS — I10 Essential (primary) hypertension: Secondary | ICD-10-CM | POA: Diagnosis not present

## 2018-03-30 DIAGNOSIS — M5416 Radiculopathy, lumbar region: Secondary | ICD-10-CM

## 2018-03-30 MED FILL — PROGESTERONE 100 MG CAPSULE: 100 | 30 days supply | Qty: 30 | Fill #3

## 2018-03-30 NOTE — Patient Instructions (Signed)
Good to see you  I am glad the back is better Lets start exercises 3 times a week  Cardiology will call you  I think the trip should be good but gave you a letter in case See em again in 2-3 weeks

## 2018-03-30 NOTE — Assessment & Plan Note (Signed)
Left lumbar radiculopathy.  Discussed with patient in great length about icing regimen and home exercises.  Patient is doing somewhat better.  Patient will declined formal physical therapy at this point.  Patient was doing monitor with the gabapentin.  Follow-up again in 4 weeks

## 2018-03-31 ENCOUNTER — Encounter: Payer: Self-pay | Admitting: *Deleted

## 2018-03-31 DIAGNOSIS — R42 Dizziness and giddiness: Secondary | ICD-10-CM | POA: Diagnosis not present

## 2018-03-31 DIAGNOSIS — R5383 Other fatigue: Secondary | ICD-10-CM | POA: Diagnosis not present

## 2018-03-31 DIAGNOSIS — I10 Essential (primary) hypertension: Secondary | ICD-10-CM | POA: Diagnosis not present

## 2018-03-31 DIAGNOSIS — Z8249 Family history of ischemic heart disease and other diseases of the circulatory system: Secondary | ICD-10-CM | POA: Diagnosis not present

## 2018-03-31 DIAGNOSIS — Z6827 Body mass index (BMI) 27.0-27.9, adult: Secondary | ICD-10-CM | POA: Diagnosis not present

## 2018-03-31 MED FILL — OLMESARTAN MEDOXOMIL 20 MG: 20 | 30 days supply | Qty: 30 | Fill #0

## 2018-03-31 NOTE — Telephone Encounter (Signed)
It appears she had a letter provided yesterday by Dr. Gardenia Phlegm.  Called and left message on her mobile number to check status.  She can let me know either with phone call back or my chart message.

## 2018-04-12 DIAGNOSIS — H01022 Squamous blepharitis right lower eyelid: Secondary | ICD-10-CM | POA: Diagnosis not present

## 2018-04-12 DIAGNOSIS — H01024 Squamous blepharitis left upper eyelid: Secondary | ICD-10-CM | POA: Diagnosis not present

## 2018-04-12 DIAGNOSIS — H01025 Squamous blepharitis left lower eyelid: Secondary | ICD-10-CM | POA: Diagnosis not present

## 2018-04-12 DIAGNOSIS — H04123 Dry eye syndrome of bilateral lacrimal glands: Secondary | ICD-10-CM | POA: Diagnosis not present

## 2018-04-12 DIAGNOSIS — H18453 Nodular corneal degeneration, bilateral: Secondary | ICD-10-CM | POA: Diagnosis not present

## 2018-04-12 DIAGNOSIS — H01021 Squamous blepharitis right upper eyelid: Secondary | ICD-10-CM | POA: Diagnosis not present

## 2018-04-12 DIAGNOSIS — H25813 Combined forms of age-related cataract, bilateral: Secondary | ICD-10-CM | POA: Diagnosis not present

## 2018-04-14 DIAGNOSIS — I10 Essential (primary) hypertension: Secondary | ICD-10-CM | POA: Diagnosis not present

## 2018-04-14 DIAGNOSIS — R5383 Other fatigue: Secondary | ICD-10-CM | POA: Diagnosis not present

## 2018-04-14 DIAGNOSIS — M545 Low back pain: Secondary | ICD-10-CM | POA: Diagnosis not present

## 2018-04-14 DIAGNOSIS — Z6827 Body mass index (BMI) 27.0-27.9, adult: Secondary | ICD-10-CM | POA: Diagnosis not present

## 2018-04-16 NOTE — Progress Notes (Signed)
Cardiology Office Note   Date:  04/17/2018   ID:  Mary Potter, DOB 1945-12-01, MRN [id number]  PCP:  Velna Hatchet, MD  Cardiologist:   No primary care provider on file. Referring:  Lyndal Pulley, DO  Chief Complaint  Patient presents with  . Hypertension      History of Present Illness: Mary Potter is a 72 y.o. female who is referred by Lyndal Pulley, DO for evaluation of an abnormal EKG and HTN.  The patient has no past cardiac history although she has a very strong family history of early coronary artery disease.  Because of this she was screened with a treadmill almost 20 years ago that she says was normal.  I do not have these results.  More recently she has had pain in her hip possibly related to some lumbar disease and she had steroid injection and treated with Toradol and prednisone taper.  Shortly following this she was noted to have significant hypertension.  It was so high well above 462 systolic at one point that she went to the emergency room late last month and I reviewed these records.  She actually had orthostatic hypotension at that time and was treated with IV fluid but did not require any management of  elevated blood pressures.  However as an outpatient she is now been started on Benicar because her blood pressures have been running high.  She is kept limited blood pressure diary and her systolics have been in the 140s.  Because of this and because her EKG demonstrates LVH with an axis shift she is referred.  She rarely has some fleeting chest discomfort but nothing reproducible with activity.  She walks routinely.  She occasionally has to take a deep breath but denies any excessive shortness of breath, PND or orthopnea.  Past Medical History:  Diagnosis Date  . 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 Hyperlipidemia   . PONV (postoperative nausea and vomiting)   . Thickened endometrium 01/03/2015  . Vaginal delivery 1976, 1985    Past Surgical  History:  Procedure Laterality Date  . ANKLE FRACTURE SURGERY    . DILATION AND CURETTAGE OF UTERUS  2012   ablation   . HYSTEROSCOPY W/D&C N/A 01/03/2015   Procedure: DILATATION AND CURETTAGE /HYSTEROSCOPY;  Surgeon: [name], MD;  Location: [facility];  Service: Gynecology;  Laterality: N/A;  . TONSILLECTOMY AND ADENOIDECTOMY       Current Outpatient Medications  Medication Sig Dispense Refill  . atorvastatin (LIPITOR) 20 MG tablet Take 20 mg by mouth 3 (three) times a week.    . cholecalciferol (VITAMIN D) 1000 UNITS tablet Take 1,000 Units by mouth 3 (three) times a week.     . estradiol (VIVELLE-DOT) 0.025 MG/24HR Place 1 patch onto the skin 2 (two) times a week.  5  . gabapentin (NEURONTIN) 100 MG capsule Take 2 capsules (200 mg total) by mouth at bedtime. 60 capsule 3  . olmesartan (BENICAR) 20 MG tablet Take 20 mg by mouth daily.  1  . progesterone (PROMETRIUM) 100 MG capsule Take 100 mg by mouth 3 (three) times a week.  5   No current facility-administered medications for this visit.     Allergies:   Talwin [pentazocine]    Social History:  The patient  reports that she has never smoked. She has never used smokeless tobacco. She reports that she does not drink alcohol or use drugs.   Family History:  The patient's family history includes  Cancer in her maternal grandfather and paternal grandfather; Heart disease in her maternal grandmother and paternal grandmother; Heart disease (age of onset: 73) in her brother; Heart disease (age of onset: 34) in her brother; Heart disease (age of onset: 64) in her mother; Hypertension in her sister; Leukemia in her brother and brother; Stroke in her father.    ROS:  Please see the history of present illness.   Otherwise, review of systems are positive for none.   All other systems are reviewed and negative.    PHYSICAL EXAM: VS:  BP (!) 164/97   Ht 5\' 4"  (1.626 m)   Wt 154 lb 3.2 oz (69.9 kg)   BMI 26.47 kg/m  , BMI Body mass index is  26.47 kg/m. GENERAL:  Well appearing HEENT:  Pupils equal round and reactive, fundi not visualized, oral mucosa unremarkable NECK:  No jugular venous distention, waveform within normal limits, carotid upstroke brisk and symmetric, no bruits, no thyromegaly LYMPHATICS:  No cervical, inguinal adenopathy LUNGS:  Clear to auscultation bilaterally BACK:  No CVA tenderness CHEST:  Unremarkable HEART:  PMI not displaced or sustained,S1 and S2 within normal limits, no S3, no S4, no clicks, no rubs, no murmurs ABD:  Flat, positive bowel sounds normal in frequency in pitch, no bruits, no rebound, no guarding, no midline pulsatile mass, no hepatomegaly, no splenomegaly EXT:  2 plus pulses throughout, no edema, no cyanosis no clubbing SKIN:  No rashes no nodules NEURO:  Cranial nerves II through XII grossly intact, motor grossly intact throughout PSYCH:  Cognitively intact, oriented to person place and time    EKG:  EKG is ordered today. The ekg ordered today demonstrates sinus rhythm, left ventricular hypertrophy by voltage criteria, left axis deviation, no acute ST-T wave changes.   Recent Labs: 03/28/2018: ALT 22; BUN 18; Creatinine, Ser 0.78; Hemoglobin 13.4; Platelets 280; Potassium 4.1; Sodium 141    Lipid Panel No results found for: CHOL, TRIG, HDL, CHOLHDL, VLDL, LDLCALC, LDLDIRECT    Wt Readings from Last 3 Encounters:  04/17/18 154 lb 3.2 oz (69.9 kg)  03/30/18 154 lb (69.9 kg)  03/21/18 148 lb (67.1 kg)      Other studies Reviewed: Additional studies/ records that were reviewed today include: None. Review of the above records demonstrates:  Please see elsewhere in the note.     ASSESSMENT AND PLAN:  HTN: Her blood pressure is better controlled on the Benicar.  She is going to keep a blood pressure diary.  I suspect she will need further med titration.  Because of her abnormal EKG and suggestion of LVH I will be checking an echocardiogram.  CHEST PAIN:  She has some rare  chest discomfort and a very strong family history.  I am going to start screening with a coronary calcium score.   Current medicines are reviewed at length with the patient today.  The patient does not have concerns regarding medicines.  The following changes have been made:  no change  Labs/ tests ordered today include:   Orders Placed This Encounter  Procedures  . CT CARDIAC SCORING  . EKG 12-Lead  . ECHOCARDIOGRAM COMPLETE     Disposition:   FU with me based on the results of the above.     Signed, Minus Breeding, MD  04/17/2018 7:16 PM    [facility]

## 2018-04-17 ENCOUNTER — Ambulatory Visit (INDEPENDENT_AMBULATORY_CARE_PROVIDER_SITE_OTHER): Payer: 59 | Admitting: Cardiology

## 2018-04-17 ENCOUNTER — Encounter: Payer: Self-pay | Admitting: Cardiology

## 2018-04-17 VITALS — BP 164/97 | Ht 64.0 in | Wt 154.2 lb

## 2018-04-17 DIAGNOSIS — R9431 Abnormal electrocardiogram [ECG] [EKG]: Secondary | ICD-10-CM

## 2018-04-17 DIAGNOSIS — R079 Chest pain, unspecified: Secondary | ICD-10-CM

## 2018-04-17 DIAGNOSIS — I10 Essential (primary) hypertension: Secondary | ICD-10-CM | POA: Diagnosis not present

## 2018-04-17 NOTE — Progress Notes (Signed)
[facility] [address] [place], [place] [address] Phone: [phone] Subjective:     CC: Low back pain  [id number]  Mary Potter is a 72 y.o. female coming in with complaint of back pain follow-up.  Seems to be on the left side.  Was found to have lumbar radiculopathy.  X-ray showed some mild facet arthropathy.  Discussed with patient about icing regimen and home exercise.  Discussed topical anti-inflammatories when she has been doing with no significant benefit.  Patient still states that it is a constant nagging pain.  He did not respond to the prednisone and had to go to the emergency room secondary to hypertension.    Past Medical History:  Diagnosis Date  . 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 Hyperlipidemia   . PONV (postoperative nausea and vomiting)   . Thickened endometrium 01/03/2015  . Vaginal delivery 1976, 1985   Past Surgical History:  Procedure Laterality Date  . ANKLE FRACTURE SURGERY    . DILATION AND CURETTAGE OF UTERUS  2012   ablation   . HYSTEROSCOPY W/D&C N/A 01/03/2015   Procedure: DILATATION AND CURETTAGE /HYSTEROSCOPY;  Surgeon: [name], MD;  Location: [facility];  Service: Gynecology;  Laterality: N/A;  . TONSILLECTOMY AND ADENOIDECTOMY     Social History   Socioeconomic History  . Marital status: Widowed    Spouse name: [name]  . Number of children: 2  . Years of education: 36  . Highest education level: Not on file  Occupational History  . Occupation: Radio producer Lead    Comment: [employer]  Social Needs  . Financial resource strain: Not on file  . Food insecurity:    Worry: Not on file    Inability: Not on file  . Transportation needs:    Medical: Not on file    Non-medical: Not on file  Tobacco Use  . Smoking status: Never Smoker  . Smokeless tobacco: Never Used  Substance and Sexual Activity  . Alcohol use: No    Alcohol/week: 0.0 oz  . Drug use: No  . Sexual activity: Not on file  Lifestyle  .  Physical activity:    Days per week: Not on file    Minutes per session: Not on file  . Stress: Not on file  Relationships  . Social connections:    Talks on phone: Not on file    Gets together: Not on file    Attends religious service: Not on file    Active member of club or organization: Not on file    Attends meetings of clubs or organizations: Not on file    Relationship status: Not on file  Other Topics Concern  . Not on file  Social History Narrative   Lives with her younger daughter, [name]. Her older daughter lives in [place] with her family.   Allergies  Allergen Reactions  . Talwin [Pentazocine] Anaphylaxis   Family History  Problem Relation Age of Onset  . Heart disease Mother 59       CABG  . Stroke Father   . Heart disease Brother 24       Transplant, died 14 years  . Heart disease Maternal Grandmother   . Cancer Maternal Grandfather   . Heart disease Paternal Grandmother   . Cancer Paternal Grandfather   . Heart disease Brother 12       RF  . Leukemia Brother   . Leukemia Brother   . Hypertension Sister  Past medical history, social, surgical and family history all reviewed in electronic medical record.  No pertanent information unless stated regarding to the chief complaint.   Review of Systems:Review of systems updated and as accurate as of 04/18/18  No headache, visual changes, nausea, vomiting, diarrhea, constipation, dizziness, abdominal pain, skin rash, fevers, chills, night sweats, weight loss, swollen lymph nodes, body aches, joint swelling,  chest pain, shortness of breath, mood changes.  Positive muscle aches  Objective  Blood pressure (!) 150/82, pulse 88, height 5\' 4"  (1.626 m), weight 154 lb (69.9 kg), SpO2 98 %. Systems examined below as of 04/18/18   General: No apparent distress alert and oriented x3 mood and affect normal, dressed appropriately.  HEENT: Pupils equal, extraocular movements intact  Respiratory: Patient's speak  in full sentences and does not appear short of breath  Cardiovascular: No lower extremity edema, non tender, no erythema  Skin: Warm dry intact with no signs of infection or rash on extremities or on axial skeleton.  Abdomen: Soft nontender  Neuro: Cranial nerves II through XII are intact, neurovascularly intact in all extremities with and 2+ pulses.  Lymph: No lymphadenopathy of posterior or anterior cervical chain or axillae bilaterally.  Gait antalgic MSK:  Non tender with full range of motion and good stability and symmetric strength and tone of shoulders, elbows, wrist, , knee and ankles bilaterally.  Back Exam:  Inspection: Loss of lordosis Motion: Flexion 45 deg, Extension 45 deg, Side Bending to 45 deg bilaterally,  Rotation to 45 deg bilaterally  SLR laying: Positive left side XSLR laying: Negative  Palpable tenderness: Tender to palpation over the paraspinal musculature lumbar spine. FABER: Positive Faber left.  Severe tenderness over the greater trochanteric area Sensory change: Gross sensation intact to all lumbar and sacral dermatomes.  Reflexes: 1+ Achilles on the left Strength at foot  Plantar-flexion: 4/5 on the left compared to right dorsi-flexion: 5/5 Eversion: 5/5 Inversion: 5/5  Leg strength  Quad: 5/5 Hamstring: 5/5 Hip flexor: 5/5 Hip abductors: 4/5 but symmetric Gait unremarkable.   Procedure: Real-time Ultrasound Guided Injection of left  greater trochanteric bursitis secondary to patient's body habitus Device: GE Logiq Q7  Ultrasound guided injection is preferred based studies that show increased duration, increased effect, greater accuracy, decreased procedural pain, increased response rate, and decreased cost with ultrasound guided versus blind injection.  Verbal informed consent obtained.  Time-out conducted.  Noted no overlying erythema, induration, or other signs of local infection.  Skin prepped in a sterile fashion.  Local anesthesia: Topical Ethyl  chloride.  With sterile technique and under real time ultrasound guidance:  Greater trochanteric area was visualized and patient's bursa was noted. A 22-gauge 3 inch needle was inserted and 4 cc of 0.5% Marcaine and 1 cc of Kenalog 40 mg/dL was injected. Pictures taken Completed without difficulty  Pain immediately resolved suggesting accurate placement of the medication.  Advised to call if fevers/chills, erythema, induration, drainage, or persistent bleeding.  Images permanently stored and available for review in the ultrasound unit.  Impression: Technically successful ultrasound guided injection.    Impression and Recommendations:     This case required medical decision making of moderate complexity.      Note: This dictation was prepared with Dragon dictation along with smaller phrase technology. Any transcriptional errors that result from this process are unintentional.

## 2018-04-17 NOTE — Patient Instructions (Signed)
Medication Instructions:  Continue current medications  If you need a refill on your cardiac medications before your next appointment, please call your pharmacy.  Labwork: None Ordered   Testing/Procedures: Your physician has requested that you have an echocardiogram. Echocardiography is a painless test that uses sound waves to create images of your heart. It provides your doctor with information about the size and shape of your heart and how well your heart's chambers and valves are working. This procedure takes approximately one hour. There are no restrictions for this procedure.    Your physician has requested that you have a Coronary Calcium Scoring Test. This test is done at our [facility]..   Follow-Up: Your physician wants you to follow-up in: As Needed.     Thank you for choosing [facility]!!

## 2018-04-18 ENCOUNTER — Ambulatory Visit (INDEPENDENT_AMBULATORY_CARE_PROVIDER_SITE_OTHER): Payer: 59 | Admitting: Family Medicine

## 2018-04-18 ENCOUNTER — Ambulatory Visit: Payer: Self-pay

## 2018-04-18 ENCOUNTER — Encounter: Payer: Self-pay | Admitting: Family Medicine

## 2018-04-18 VITALS — BP 150/82 | HR 88 | Ht 64.0 in | Wt 154.0 lb

## 2018-04-18 DIAGNOSIS — M5416 Radiculopathy, lumbar region: Secondary | ICD-10-CM

## 2018-04-18 DIAGNOSIS — M7062 Trochanteric bursitis, left hip: Secondary | ICD-10-CM

## 2018-04-18 DIAGNOSIS — M25552 Pain in left hip: Secondary | ICD-10-CM

## 2018-04-18 NOTE — Assessment & Plan Note (Signed)
Injected.  Discussed icing regimen and topical anti-inflammatory

## 2018-04-18 NOTE — Patient Instructions (Signed)
Good to see you  I injected the side of the hip today and I hope it helps Ice is your friend We will get MRI of your back to see if we can find out what is going on.  I will write you the results and discuss

## 2018-04-18 NOTE — Assessment & Plan Note (Signed)
Still likely contributing to movement discomfort and pain.  MRI of the patient's lumbar spine ordered today due to significant weakness as well as patient continues to have worsening pain.  Has had difficulty with hypertension as well but is concerning and likely not be secondary of the prednisone the patient had.  Discussed icing regimen, home exercises, depending on imaging will discuss otherwise.  Given injection of the left greater trochanteric area for diagnostic purposes.  Follow-up again after imaging.

## 2018-04-20 ENCOUNTER — Other Ambulatory Visit: Payer: 59

## 2018-04-26 MED FILL — OLMESARTAN MEDOXOMIL 20 MG: 20 | 30 days supply | Qty: 30 | Fill #1

## 2018-04-26 MED FILL — MURO-128 2% EYE DROPS: 2 | 50 days supply | Qty: 15 | Fill #1

## 2018-04-27 ENCOUNTER — Ambulatory Visit (INDEPENDENT_AMBULATORY_CARE_PROVIDER_SITE_OTHER)
Admission: RE | Admit: 2018-04-27 | Discharge: 2018-04-27 | Disposition: A | Discharge status: Home / Self Care | Payer: 59 | Source: Ambulatory Visit | Attending: Cardiology | Admitting: Cardiology

## 2018-04-27 ENCOUNTER — Other Ambulatory Visit: Payer: Self-pay

## 2018-04-27 ENCOUNTER — Telehealth: Payer: Self-pay | Admitting: Cardiology

## 2018-04-27 ENCOUNTER — Ambulatory Visit (HOSPITAL_COMMUNITY): Payer: 59 | Attending: Internal Medicine

## 2018-04-27 DIAGNOSIS — R079 Chest pain, unspecified: Secondary | ICD-10-CM | POA: Insufficient documentation

## 2018-04-27 DIAGNOSIS — R931 Abnormal findings on diagnostic imaging of heart and coronary circulation: Secondary | ICD-10-CM

## 2018-04-27 DIAGNOSIS — I119 Hypertensive heart disease without heart failure: Secondary | ICD-10-CM | POA: Diagnosis not present

## 2018-04-27 DIAGNOSIS — E785 Hyperlipidemia, unspecified: Secondary | ICD-10-CM | POA: Insufficient documentation

## 2018-04-27 DIAGNOSIS — R9431 Abnormal electrocardiogram [ECG] [EKG]: Secondary | ICD-10-CM | POA: Diagnosis not present

## 2018-04-27 DIAGNOSIS — I340 Nonrheumatic mitral (valve) insufficiency: Secondary | ICD-10-CM | POA: Diagnosis not present

## 2018-04-27 NOTE — Telephone Encounter (Signed)
Jane from radiology wanted to let Dr Percival Spanish know of findings on over read of cardiac ct further testing is suggested to right breast see report.Will forward to Dr Percival Spanish for review .Adonis Housekeeper

## 2018-05-03 ENCOUNTER — Encounter: Payer: Self-pay | Admitting: Cardiology

## 2018-05-03 NOTE — Telephone Encounter (Signed)
-----   Message from Minus Breeding, MD sent at 04/28/2018 12:47 PM EDT ----- She needs a POET (Plain Old Exercise Treadmill).  I discussed this with her.  Mild coronary calcium.  She also is not at target with her lipids and she will start taking her Lipitor 20 mg daily.  She is also aware of the breast finding and will follow up with her breast doctors.  Send results to Velna Hatchet, MD

## 2018-05-03 NOTE — Telephone Encounter (Signed)
Pt aware of her Echo and coronary calcium score, GXT ordered and send to schedule to be schedule.Marland Kitchen

## 2018-05-05 ENCOUNTER — Telehealth (HOSPITAL_COMMUNITY): Payer: Self-pay

## 2018-05-05 NOTE — Telephone Encounter (Signed)
Awaiting return call at this time. 

## 2018-05-09 ENCOUNTER — Ambulatory Visit (HOSPITAL_COMMUNITY)
Admission: RE | Admit: 2018-05-09 | Discharge: 2018-05-09 | Disposition: A | Discharge status: Home / Self Care | Payer: 59 | Source: Ambulatory Visit | Attending: Cardiovascular Disease | Admitting: Cardiovascular Disease

## 2018-05-09 DIAGNOSIS — R931 Abnormal findings on diagnostic imaging of heart and coronary circulation: Secondary | ICD-10-CM | POA: Diagnosis not present

## 2018-05-09 LAB — EXERCISE TOLERANCE TEST
Estimated workload: 8.5 METS
Exercise duration (min): 7 min
Exercise duration (sec): 1 s
MPHR: 149 {beats}/min
Peak HR: 181 {beats}/min
Percent HR: 121 %
RPE: 19
Rest HR: 91 {beats}/min

## 2018-05-11 ENCOUNTER — Ambulatory Visit
Admission: RE | Admit: 2018-05-11 | Discharge: 2018-05-11 | Disposition: A | Discharge status: Home / Self Care | Payer: 59 | Source: Ambulatory Visit | Attending: Family Medicine | Admitting: Family Medicine

## 2018-05-11 DIAGNOSIS — M48061 Spinal stenosis, lumbar region without neurogenic claudication: Secondary | ICD-10-CM | POA: Diagnosis not present

## 2018-05-11 DIAGNOSIS — M25552 Pain in left hip: Secondary | ICD-10-CM

## 2018-05-11 MED FILL — GABAPENTIN 100 MG CAP: 100 | 30 days supply | Qty: 60 | Fill #1

## 2018-05-17 ENCOUNTER — Telehealth: Payer: Self-pay | Admitting: *Deleted

## 2018-05-17 DIAGNOSIS — M5416 Radiculopathy, lumbar region: Secondary | ICD-10-CM

## 2018-05-17 NOTE — Telephone Encounter (Signed)
-----   Message from Lyndal Pulley, DO sent at 05/11/2018  8:19 PM EDT ----- Can we call patient tell her moderate arthritis and cyst pushing on the nerve .  Could respond well to nerve injection L L5

## 2018-05-17 NOTE — Telephone Encounter (Signed)
Discussed with pt. Order sent to [facility].

## 2018-05-18 DIAGNOSIS — R922 Inconclusive mammogram: Secondary | ICD-10-CM | POA: Diagnosis not present

## 2018-05-18 DIAGNOSIS — R928 Other abnormal and inconclusive findings on diagnostic imaging of breast: Secondary | ICD-10-CM | POA: Diagnosis not present

## 2018-05-18 MED FILL — PROGESTERONE 100 MG CAPSULE: 100 | 30 days supply | Qty: 30 | Fill #0

## 2018-05-18 MED FILL — ESTRADIOL 0.025 MG PATCH: 0.025 | 28 days supply | Qty: 8 | Fill #0

## 2018-05-20 MED FILL — OLMESARTAN MEDOXOMIL 20 MG: 20 | 90 days supply | Qty: 90 | Fill #0

## 2018-05-26 ENCOUNTER — Ambulatory Visit
Admission: RE | Admit: 2018-05-26 | Discharge: 2018-05-26 | Disposition: A | Discharge status: Home / Self Care | Payer: 59 | Source: Ambulatory Visit | Attending: Family Medicine | Admitting: Family Medicine

## 2018-05-26 DIAGNOSIS — M47817 Spondylosis without myelopathy or radiculopathy, lumbosacral region: Secondary | ICD-10-CM | POA: Diagnosis not present

## 2018-05-26 DIAGNOSIS — M5416 Radiculopathy, lumbar region: Secondary | ICD-10-CM

## 2018-05-26 MED ORDER — METHYLPREDNISOLONE ACETATE 40 MG/ML INJ SUSP (RADIOLOG
120.0000 mg | Freq: Once | INTRAMUSCULAR | Status: AC
  Administered 2018-05-26: 120 mg via EPIDURAL

## 2018-05-26 MED ORDER — IOPAMIDOL (ISOVUE-M 200) INJECTION 41%
1.0000 mL | Freq: Once | INTRAMUSCULAR | Status: AC
  Administered 2018-05-26: 1 mL via EPIDURAL

## 2018-05-26 NOTE — Discharge Instructions (Signed)

## 2018-06-02 ENCOUNTER — Telehealth: Payer: Self-pay

## 2018-06-02 NOTE — Telephone Encounter (Signed)
   [facility] Pre-operative Risk Assessment    Request for surgical clearance:  1. What type of surgery is being performed? Superficial keratectomy   2. When is this surgery scheduled? TBD   3. What type of clearance is required (medical clearance vs. Pharmacy clearance to hold med vs. Both)? medical  4. Are there any medications that need to be held prior to surgery and how long? no   5. Practice name and name of physician performing surgery? Dr. Patrice Paradise at [facility]   6. What is your office phone number [phone]    7.   What is your office fax number  [phone]  8.   Anesthesia type (None, local, MAC, general) ? MAC   Damian Leavell 06/02/2018, 12:05 PM  _________________________________________________________________   (provider comments below)

## 2018-06-02 NOTE — Telephone Encounter (Signed)
   Primary Cardiologist: Minus Breeding, MD  Chart reviewed as part of pre-operative protocol coverage. Patient was contacted 06/02/2018 in reference to pre-operative risk assessment for pending surgery as outlined below.  Mary Potter was last seen on 04/17/18 by Dr. Percival Spanish. H/o HTN, orthostasis, mild LVH, normal LVEF. Elevated calcium score on CT 04/2018. ETT 05/09/18 wnl. Since that day, Mary Potter has done great. She walks regularly and has not had any new cardiac symptoms, angina, or dyspnea.   Therefore, based on ACC/AHA guidelines, the patient would be at acceptable risk for the planned procedure without further cardiovascular testing.   I will route this recommendation to the requesting party via Epic fax function and remove from pre-op pool.  Please call with questions.  Charlie Pitter, PA-C 06/02/2018, 1:35 PM

## 2018-06-26 MED FILL — ESTRADIOL 0.025 MG PATCH: 0.025 | 28 days supply | Qty: 8 | Fill #1

## 2018-06-28 NOTE — Progress Notes (Signed)
[facility] [address] [place], [place] [address] Phone: [phone] Subjective:   Mary Potter, am serving as a scribe for Dr. Hulan Saas.   CC: Back pain follow-up  [id number]  Mary Potter is a 72 y.o. female coming in with complaint of back pain. She had an epidural on 05/26/18 which reduced her pain to 0/10 for 4 weeks. She said that the tingling in her left leg has returned and has now increased to a constant pain and tingling for the past 4 days.  Patient is concerned because it is almost as bad as what it was previously.  Does not want to have as much pain though.  Has noticed having more difficulty with lifting the left hip     Past Medical History:  Diagnosis Date  . 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 Hyperlipidemia   . PONV (postoperative nausea and vomiting)   . Thickened endometrium 01/03/2015  . Vaginal delivery 1976, 1985   Past Surgical History:  Procedure Laterality Date  . ANKLE FRACTURE SURGERY    . DILATION AND CURETTAGE OF UTERUS  2012   ablation   . HYSTEROSCOPY W/D&C N/A 01/03/2015   Procedure: DILATATION AND CURETTAGE /HYSTEROSCOPY;  Surgeon: [name], MD;  Location: [facility];  Service: Gynecology;  Laterality: N/A;  . TONSILLECTOMY AND ADENOIDECTOMY     Social History   Socioeconomic History  . Marital status: Widowed    Spouse name: Mary Potter  . Number of children: 2  . Years of education: 27  . Highest education level: Not on file  Occupational History  . Occupation: Radio producer Lead    Comment: [employer]  Social Needs  . Financial resource strain: Not on file  . Food insecurity:    Worry: Not on file    Inability: Not on file  . Transportation needs:    Medical: Not on file    Non-medical: Not on file  Tobacco Use  . Smoking status: Never Smoker  . Smokeless tobacco: Never Used  Substance and Sexual Activity  . Alcohol use: Potter    Alcohol/week: 0.0 standard drinks  . Drug use: Potter  . Sexual  activity: Not on file  Lifestyle  . Physical activity:    Days per week: Not on file    Minutes per session: Not on file  . Stress: Not on file  Relationships  . Social connections:    Talks on phone: Not on file    Gets together: Not on file    Attends religious service: Not on file    Active member of club or organization: Not on file    Attends meetings of clubs or organizations: Not on file    Relationship status: Not on file  Other Topics Concern  . Not on file  Social History Narrative   Lives with her younger daughter, Mary Potter. Her older daughter lives in [place] with her family.   Allergies  Allergen Reactions  . Talwin [Pentazocine] Anaphylaxis   Family History  Problem Relation Age of Onset  . Heart disease Mother 51       CABG  . Stroke Father   . Heart disease Brother 94       Transplant, died 43 years  . Heart disease Maternal Grandmother   . Cancer Maternal Grandfather   . Heart disease Paternal Grandmother   . Cancer Paternal Grandfather   . Heart disease Brother 12       RF  .  Leukemia Brother   . Leukemia Brother   . Hypertension Sister     Current Outpatient Medications (Endocrine & Metabolic):  .  estradiol (VIVELLE-DOT) 0.025 MG/24HR, Place 1 patch onto the skin 2 (two) times a week. .  progesterone (PROMETRIUM) 100 MG capsule, Take 100 mg by mouth 3 (three) times a week.  Current Outpatient Medications (Cardiovascular):  .  atorvastatin (LIPITOR) 20 MG tablet, Take 20 mg by mouth 3 (three) times a week. .  olmesartan (BENICAR) 20 MG tablet, Take 20 mg by mouth daily.     Current Outpatient Medications (Other):  .  cholecalciferol (VITAMIN D) 1000 UNITS tablet, Take 1,000 Units by mouth 3 (three) times a week.  .  gabapentin (NEURONTIN) 100 MG capsule, Take 2 capsules (200 mg total) by mouth at bedtime.    Past medical history, social, surgical and family history all reviewed in electronic medical record.  Potter pertanent information  unless stated regarding to the chief complaint.   Review of Systems:  Potter headache, visual changes, nausea, vomiting, diarrhea, constipation, dizziness, abdominal pain, skin rash, fevers, chills, night sweats, weight loss, swollen lymph nodes, body aches, joint swelling, muscle aches, chest pain, shortness of breath, mood changes.   Objective  Blood pressure 138/86, pulse (!) 104, height 5\' 4"  (1.626 m), weight 157 lb (71.2 kg), SpO2 90 %.    General: Potter apparent distress alert and oriented x3 mood and affect normal, dressed appropriately.  HEENT: Pupils equal, extraocular movements intact  Respiratory: Patient's speak in full sentences and does not appear short of breath  Cardiovascular: Potter lower extremity edema, non tender, Potter erythema  Skin: Warm dry intact with Potter signs of infection or rash on extremities or on axial skeleton.  Abdomen: Soft nontender  Neuro: Cranial nerves II through XII are intact, neurovascularly intact in all extremities with 2+ DTRs and 2+ pulses.  Lymph: Potter lymphadenopathy of posterior or anterior cervical chain or axillae bilaterally.  Gait normal with good balance and coordination.  MSK:  Non tender with full range of motion and good stability and symmetric strength and tone of shoulders, elbows, wrist, hip, knee and ankles bilaterally.  Mild arthritic changes of multiple joints  Back exam shows mild loss of lordosis.  Patient does have a positive straight leg test at 25 degrees in the L4-L5 distribution.  Patient does have very mild weakness of the left lower extremity compared to the contralateral side.  Deep tendon reflexes though are intact.  Mild weakness with hip abductors noted as well.    Impression and Recommendations:     This case required medical decision making of moderate complexity. The above documentation has been reviewed and is accurate and complete Mary Pulley, DO       Note: This dictation was prepared with Dragon dictation along  with smaller phrase technology. Any transcriptional errors that result from this process are unintentional.

## 2018-06-29 ENCOUNTER — Ambulatory Visit (INDEPENDENT_AMBULATORY_CARE_PROVIDER_SITE_OTHER): Payer: 59 | Admitting: Family Medicine

## 2018-06-29 ENCOUNTER — Encounter: Payer: Self-pay | Admitting: Family Medicine

## 2018-06-29 VITALS — BP 138/86 | HR 104 | Ht 64.0 in | Wt 157.0 lb

## 2018-06-29 DIAGNOSIS — M5416 Radiculopathy, lumbar region: Secondary | ICD-10-CM

## 2018-06-29 DIAGNOSIS — Z01419 Encounter for gynecological examination (general) (routine) without abnormal findings: Secondary | ICD-10-CM | POA: Diagnosis not present

## 2018-06-29 DIAGNOSIS — M5126 Other intervertebral disc displacement, lumbar region: Secondary | ICD-10-CM | POA: Insufficient documentation

## 2018-06-29 DIAGNOSIS — R931 Abnormal findings on diagnostic imaging of heart and coronary circulation: Secondary | ICD-10-CM

## 2018-06-29 MED FILL — PROGESTERONE 100 MG CAPSULE: 100 | 90 days supply | Qty: 90 | Fill #0

## 2018-06-29 NOTE — Assessment & Plan Note (Signed)
Spent  25 minutes with patient face-to-face and had greater than 50% of counseling including as described in assessment and plan.  Discussed with patient about how patient responded initially to the nerve root injection and did do very well with her percent resolution of pain.  This likely is what is contributing to the significant majority of patient's pain and discomfort.  We discussed with patient about icing regimen and home exercises.  We discussed which activities to do which wants to avoid.  Patient is to increase activity slowly over the course of next several days.  Patient will have the nerve root injection and see me in 2 to 3 weeks.  No change in medicine.

## 2018-06-29 NOTE — Patient Instructions (Signed)
Good to see you  Ice is your friend Order another epidural and call them [phone]  See me again in 2-3 weeks after the injection

## 2018-07-05 ENCOUNTER — Other Ambulatory Visit: Payer: Self-pay | Admitting: Family Medicine

## 2018-07-05 ENCOUNTER — Ambulatory Visit
Admission: RE | Admit: 2018-07-05 | Discharge: 2018-07-05 | Disposition: A | Discharge status: Home / Self Care | Payer: 59 | Source: Ambulatory Visit | Attending: Family Medicine | Admitting: Family Medicine

## 2018-07-05 DIAGNOSIS — M5416 Radiculopathy, lumbar region: Secondary | ICD-10-CM

## 2018-07-05 MED ORDER — METHYLPREDNISOLONE ACETATE 40 MG/ML INJ SUSP (RADIOLOG
120.0000 mg | Freq: Once | INTRAMUSCULAR | Status: AC
  Administered 2018-07-05: 120 mg via EPIDURAL

## 2018-07-05 MED ORDER — IOPAMIDOL (ISOVUE-M 200) INJECTION 41%
1.0000 mL | Freq: Once | INTRAMUSCULAR | Status: AC
  Administered 2018-07-05: 1 mL via EPIDURAL

## 2018-07-05 MED FILL — GABAPENTIN 100 MG CAP: 100 | 30 days supply | Qty: 60 | Fill #2

## 2018-07-05 NOTE — Discharge Instructions (Signed)

## 2018-07-17 ENCOUNTER — Other Ambulatory Visit: Payer: 59

## 2018-07-25 MED FILL — ESTRADIOL 0.025 MG PATCH: 0.025 | 84 days supply | Qty: 24 | Fill #0

## 2018-08-01 ENCOUNTER — Telehealth: Payer: Self-pay | Admitting: Internal Medicine

## 2018-08-01 NOTE — Telephone Encounter (Signed)
Copied from [place] [id number]. Topic: Quick Communication - See Telephone Encounter >> Aug 01, 2018 11:53 AM Reyne Dumas L wrote: CRM for notification. See Telephone encounter for: 08/01/18.  Pt has had several epidural steroid injections.  Pt states that they are not working and would like to speak with the physician over the phone to see what his recommendations are now. Pt can be reached at [phone]

## 2018-08-03 NOTE — Telephone Encounter (Signed)
Labs normal  Continue with vestibular neuro

## 2018-08-07 ENCOUNTER — Telehealth: Payer: Self-pay

## 2018-08-07 DIAGNOSIS — H18453 Nodular corneal degeneration, bilateral: Secondary | ICD-10-CM | POA: Diagnosis not present

## 2018-08-07 DIAGNOSIS — H04123 Dry eye syndrome of bilateral lacrimal glands: Secondary | ICD-10-CM | POA: Diagnosis not present

## 2018-08-07 DIAGNOSIS — H01025 Squamous blepharitis left lower eyelid: Secondary | ICD-10-CM | POA: Diagnosis not present

## 2018-08-07 DIAGNOSIS — H25813 Combined forms of age-related cataract, bilateral: Secondary | ICD-10-CM | POA: Diagnosis not present

## 2018-08-07 DIAGNOSIS — H01024 Squamous blepharitis left upper eyelid: Secondary | ICD-10-CM | POA: Diagnosis not present

## 2018-08-07 DIAGNOSIS — H01021 Squamous blepharitis right upper eyelid: Secondary | ICD-10-CM | POA: Diagnosis not present

## 2018-08-07 DIAGNOSIS — H01022 Squamous blepharitis right lower eyelid: Secondary | ICD-10-CM | POA: Diagnosis not present

## 2018-08-07 MED FILL — TOBRADEX EYE OINTMENT: 0.3-0.1 | 15 days supply | Qty: 4 | Fill #0

## 2018-08-07 MED FILL — DOXYCYCLINE HYCLATE 100 MG: 100 | 30 days supply | Qty: 30 | Fill #0

## 2018-08-07 MED FILL — MOXIFLOXACIN HCL 0.5 % SOLN: 0.5 | 15 days supply | Qty: 3 | Fill #0

## 2018-08-07 MED FILL — PREDNISOLONE AC 1% EYE DROP: 1 | 25 days supply | Qty: 5 | Fill #0

## 2018-08-07 NOTE — Telephone Encounter (Signed)
    [facility] Pre-operative Risk Assessment    Request for surgical clearance:  1. What type of surgery is being performed?  Superficial keratectomy   2. When is this surgery scheduled?  08/29/18   3. What type of clearance is required (medical clearance vs. Pharmacy clearance to hold med vs. Both)?  medical  4. Are there any medications that need to be held prior to surgery and how long?    5. Practice name and name of physician performing surgery?  [facility]   6. What is your office phone number [phone]    7.   What is your office fax number [phone]  8.   Anesthesia type (None, local, MAC, general) ?  Gwyneth Sprout  Abdulah Iqbal 08/07/2018, 2:21 PM  _________________________________________________________________   (provider comments below)

## 2018-08-08 ENCOUNTER — Other Ambulatory Visit: Payer: Self-pay | Admitting: *Deleted

## 2018-08-08 DIAGNOSIS — M5416 Radiculopathy, lumbar region: Secondary | ICD-10-CM

## 2018-08-08 NOTE — Telephone Encounter (Signed)
We sent a clearance in August for TBD I have called pt to follow up if same study.   She to call back. I resent the prior clearance.

## 2018-08-09 NOTE — Telephone Encounter (Signed)
Dr. Percival Spanish, the patient is already cleared as below however Dr. Patrice Paradise wants clearance from you. Can you please provide clearance.    "Chart reviewed as part of pre-operative protocol coverage. Patient was contacted 06/02/2018 in reference to pre-operative risk assessment for pending surgery as outlined below.  Mary Potter was last seen on 04/17/18 by Dr. Percival Spanish. H/o HTN, orthostasis, mild LVH, normal LVEF. Elevated calcium score on CT 04/2018. ETT 05/09/18 wnl. Since that day, Mary Potter has done great. She walks regularly and has not had any new cardiac symptoms, angina, or dyspnea.   Therefore, based on ACC/AHA guidelines, the patient would be at acceptable risk for the planned procedure without further cardiovascular testing.   I will route this recommendation to the requesting party via Epic fax function and remove from pre-op pool.  Please call with questions.  Charlie Pitter, PA-C 06/02/2018, 1:35 PM"

## 2018-08-09 NOTE — Telephone Encounter (Signed)
Follow up    Mary Potter is calling from [facility] is calling. The reason that a pre-op was resent is because Dr. Patrice Paradise is wanted the clearance to be signed by the MD and not a APP.

## 2018-08-10 ENCOUNTER — Ambulatory Visit
Admission: RE | Admit: 2018-08-10 | Discharge: 2018-08-10 | Disposition: A | Discharge status: Home / Self Care | Payer: 59 | Source: Ambulatory Visit | Attending: Family Medicine | Admitting: Family Medicine

## 2018-08-10 ENCOUNTER — Other Ambulatory Visit: Payer: Self-pay | Admitting: Family Medicine

## 2018-08-10 ENCOUNTER — Other Ambulatory Visit: Payer: Self-pay | Admitting: *Deleted

## 2018-08-10 DIAGNOSIS — M5126 Other intervertebral disc displacement, lumbar region: Secondary | ICD-10-CM

## 2018-08-10 DIAGNOSIS — M5416 Radiculopathy, lumbar region: Secondary | ICD-10-CM

## 2018-08-10 DIAGNOSIS — M47817 Spondylosis without myelopathy or radiculopathy, lumbosacral region: Secondary | ICD-10-CM | POA: Diagnosis not present

## 2018-08-10 MED ORDER — IOPAMIDOL (ISOVUE-M 200) INJECTION 41%
1.0000 mL | Freq: Once | INTRAMUSCULAR | Status: AC
  Administered 2018-08-10: 1 mL via EPIDURAL

## 2018-08-10 MED ORDER — METHYLPREDNISOLONE ACETATE 40 MG/ML INJ SUSP (RADIOLOG
120.0000 mg | Freq: Once | INTRAMUSCULAR | Status: AC
  Administered 2018-08-10: 120 mg via EPIDURAL

## 2018-08-10 NOTE — Discharge Instructions (Signed)

## 2018-08-10 NOTE — Telephone Encounter (Signed)
I agree with the cardiac clearance note.  No further testing is needed.  The patient is at acceptable cardiovascular risk for the planned procedure.

## 2018-08-21 MED FILL — MURO-128 2% EYE DROPS: 2 | 50 days supply | Qty: 15 | Fill #2

## 2018-08-21 MED FILL — OLMESARTAN MEDOXOMIL 20 MG: 20 | 90 days supply | Qty: 90 | Fill #1

## 2018-08-29 DIAGNOSIS — H18451 Nodular corneal degeneration, right eye: Secondary | ICD-10-CM | POA: Diagnosis not present

## 2018-08-29 DIAGNOSIS — H52211 Irregular astigmatism, right eye: Secondary | ICD-10-CM | POA: Diagnosis not present

## 2018-08-29 MED FILL — HYDROCODON-APAP 5-325: 5-325 | 5 days supply | Qty: 20 | Fill #0

## 2018-08-30 DIAGNOSIS — R110 Nausea: Secondary | ICD-10-CM | POA: Diagnosis not present

## 2018-08-30 DIAGNOSIS — R000 Tachycardia, unspecified: Secondary | ICD-10-CM | POA: Diagnosis not present

## 2018-08-30 DIAGNOSIS — R52 Pain, unspecified: Secondary | ICD-10-CM | POA: Diagnosis not present

## 2018-08-30 DIAGNOSIS — R457 State of emotional shock and stress, unspecified: Secondary | ICD-10-CM | POA: Diagnosis not present

## 2018-09-08 DIAGNOSIS — R82998 Other abnormal findings in urine: Secondary | ICD-10-CM | POA: Diagnosis not present

## 2018-09-08 DIAGNOSIS — Z0000 Encounter for general adult medical examination without abnormal findings: Secondary | ICD-10-CM | POA: Diagnosis not present

## 2018-09-08 DIAGNOSIS — M810 Age-related osteoporosis without current pathological fracture: Secondary | ICD-10-CM | POA: Diagnosis not present

## 2018-09-08 DIAGNOSIS — I10 Essential (primary) hypertension: Secondary | ICD-10-CM | POA: Diagnosis not present

## 2018-09-19 DIAGNOSIS — Z1389 Encounter for screening for other disorder: Secondary | ICD-10-CM | POA: Diagnosis not present

## 2018-09-19 DIAGNOSIS — M5416 Radiculopathy, lumbar region: Secondary | ICD-10-CM | POA: Diagnosis not present

## 2018-09-19 DIAGNOSIS — H268 Other specified cataract: Secondary | ICD-10-CM | POA: Diagnosis not present

## 2018-09-19 DIAGNOSIS — I10 Essential (primary) hypertension: Secondary | ICD-10-CM | POA: Diagnosis not present

## 2018-09-19 DIAGNOSIS — Z0000 Encounter for general adult medical examination without abnormal findings: Secondary | ICD-10-CM | POA: Diagnosis not present

## 2018-09-19 DIAGNOSIS — M7138 Other bursal cyst, other site: Secondary | ICD-10-CM | POA: Diagnosis not present

## 2018-09-19 DIAGNOSIS — H1840 Unspecified corneal degeneration: Secondary | ICD-10-CM | POA: Diagnosis not present

## 2018-09-19 DIAGNOSIS — Z1212 Encounter for screening for malignant neoplasm of rectum: Secondary | ICD-10-CM | POA: Diagnosis not present

## 2018-09-19 DIAGNOSIS — E7849 Other hyperlipidemia: Secondary | ICD-10-CM | POA: Diagnosis not present

## 2018-09-19 DIAGNOSIS — R9431 Abnormal electrocardiogram [ECG] [EKG]: Secondary | ICD-10-CM | POA: Diagnosis not present

## 2018-09-19 MED FILL — ROSUVASTATIN CALCIUM 10 MG: 10 | 84 days supply | Qty: 36 | Fill #0

## 2018-09-20 DIAGNOSIS — H18451 Nodular corneal degeneration, right eye: Secondary | ICD-10-CM | POA: Diagnosis not present

## 2018-09-20 MED FILL — PREDNISOLONE AC 1% EYE DROP: 1 | 67 days supply | Qty: 10 | Fill #0

## 2018-10-08 DIAGNOSIS — R05 Cough: Secondary | ICD-10-CM | POA: Diagnosis not present

## 2018-10-08 DIAGNOSIS — J069 Acute upper respiratory infection, unspecified: Secondary | ICD-10-CM | POA: Diagnosis not present

## 2018-11-01 HISTORY — PX: CATARACT EXTRACTION: SUR2

## 2018-11-08 MED FILL — ESTRADIOL 0.025 MG PATCH: 0.025 | 84 days supply | Qty: 24 | Fill #1

## 2018-11-23 ENCOUNTER — Other Ambulatory Visit: Payer: Self-pay | Admitting: Family Medicine

## 2018-11-23 MED FILL — MELOXICAM 15 MG TABLET: 15 | 30 days supply | Qty: 30 | Fill #0

## 2018-11-23 MED FILL — OLMESARTAN MEDOXOMIL 20 MG: 20 | 90 days supply | Qty: 90 | Fill #0

## 2018-11-27 MED FILL — ROSUVASTATIN CALCIUM 10 MG: 10 | 84 days supply | Qty: 36 | Fill #1

## 2018-12-05 DIAGNOSIS — H25813 Combined forms of age-related cataract, bilateral: Secondary | ICD-10-CM | POA: Diagnosis not present

## 2018-12-05 DIAGNOSIS — H16223 Keratoconjunctivitis sicca, not specified as Sjogren's, bilateral: Secondary | ICD-10-CM | POA: Diagnosis not present

## 2018-12-05 DIAGNOSIS — H18452 Nodular corneal degeneration, left eye: Secondary | ICD-10-CM | POA: Diagnosis not present

## 2018-12-05 DIAGNOSIS — H52212 Irregular astigmatism, left eye: Secondary | ICD-10-CM | POA: Diagnosis not present

## 2018-12-05 DIAGNOSIS — Z9889 Other specified postprocedural states: Secondary | ICD-10-CM | POA: Diagnosis not present

## 2018-12-27 MED FILL — OFLOXACIN 0.3% EYE DROPS: 0.3 | 25 days supply | Qty: 5 | Fill #0

## 2018-12-27 MED FILL — PREDNISOLONE AC 1% EYE DROP: 1 | 50 days supply | Qty: 10 | Fill #0

## 2018-12-27 MED FILL — KETOROLAC 0.4% OPHTH SOLN: 0.4 | 25 days supply | Qty: 5 | Fill #0

## 2019-01-03 DIAGNOSIS — H268 Other specified cataract: Secondary | ICD-10-CM | POA: Diagnosis not present

## 2019-01-03 DIAGNOSIS — H2511 Age-related nuclear cataract, right eye: Secondary | ICD-10-CM | POA: Diagnosis not present

## 2019-01-11 DIAGNOSIS — E7849 Other hyperlipidemia: Secondary | ICD-10-CM | POA: Diagnosis not present

## 2019-02-08 MED FILL — PROGESTERONE 100 MG CAPSULE: 100 | 90 days supply | Qty: 90 | Fill #0

## 2019-02-08 MED FILL — DOTTI 0.025 MG/24HR PTTW: 0.025 | 84 days supply | Qty: 24 | Fill #0

## 2019-02-08 MED FILL — OLMESARTAN MEDOXOMIL 20 MG: 20 | 90 days supply | Qty: 90 | Fill #0

## 2019-02-08 MED FILL — ROSUVASTATIN CALCIUM 10 MG: 10 | 84 days supply | Qty: 36 | Fill #0

## 2019-02-18 IMAGING — CT CT HEART SCORING
2 series · 16 of 20 positions shown, 18 images · non-contrast
Comparison: None.

ADDENDUM:
OVER-READ INTERPRETATION  CT CHEST

The following report is an over-read performed by radiologist Dr.
does not include interpretation of cardiac or coronary anatomy or
pathology. The calcium score interpretation by the cardiologist is
attached.
CLINICAL DATA: Risk stratification
EXAM:
Coronary Calcium Score
TECHNIQUE: The patient was scanned on a Siemens Somatom 64 slice scanner. Axial
non-contrast 3mm slices were carried out through the heart. The data
set was analyzed on a dedicated work station and scored using the
Agatson method.

[Series 2: casc 3.0 i36f 2 bestdiast 70 % · axial · 0.34mm/px · z∈[-231,-132]mm · 8 of 43 slices shown, 10 images]
[im 5/43  vessel]
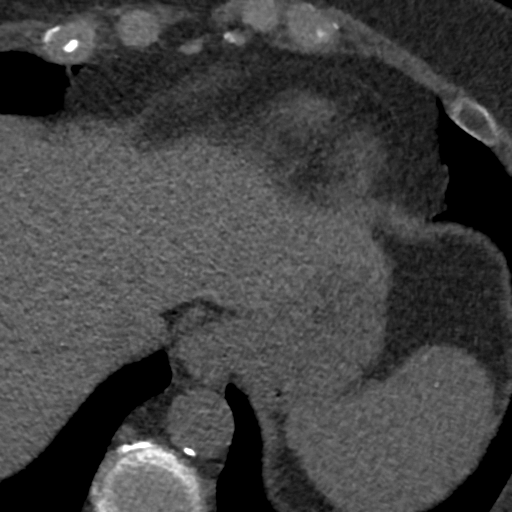
[im 5/43  lung]
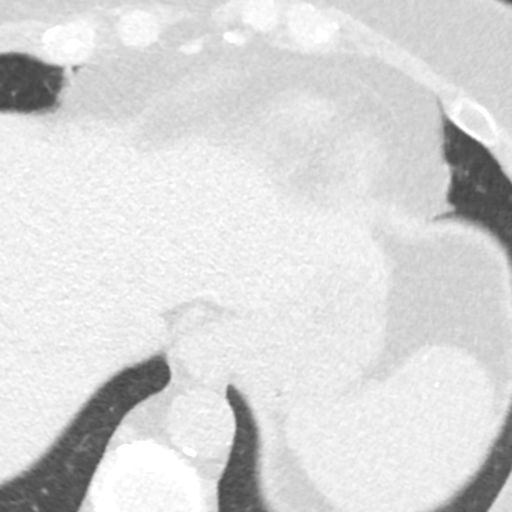
[im 10/43  vessel]
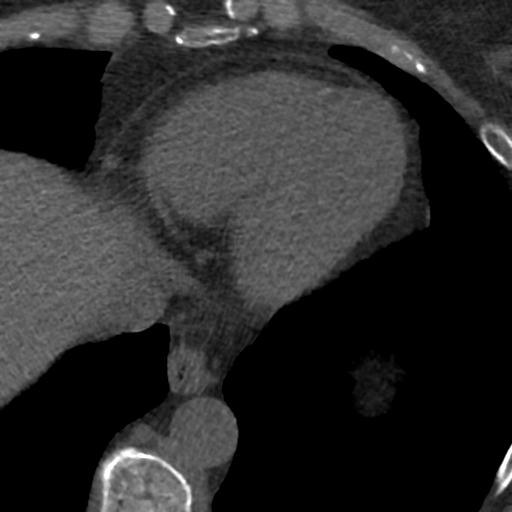
[im 15/43  vessel]
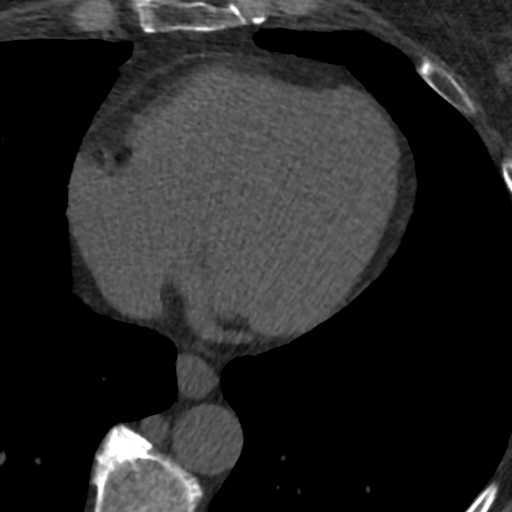
[im 19/43  vessel]
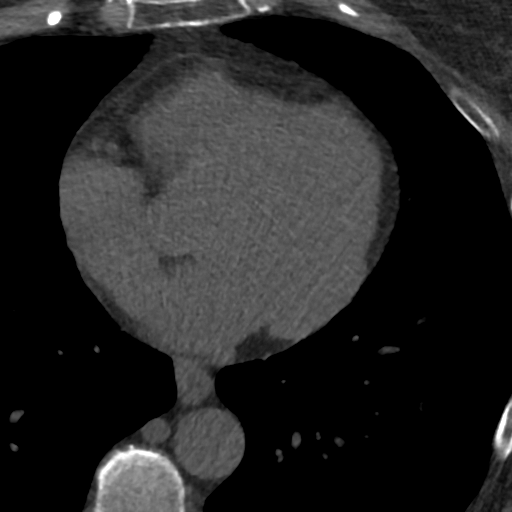
[im 24/43  vessel]
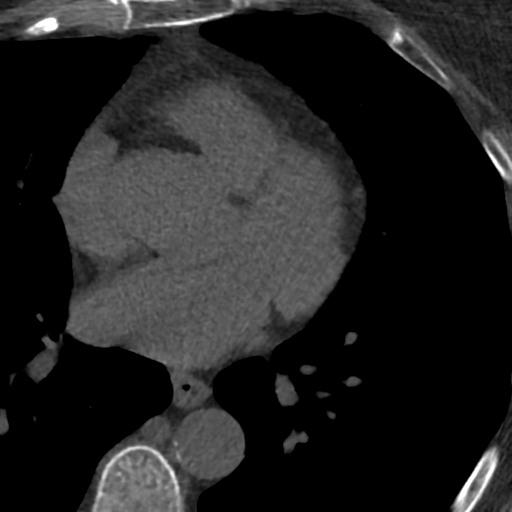
[im 24/43  lung]
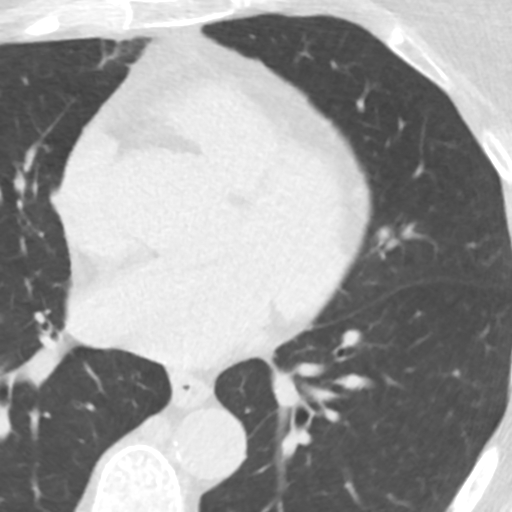
[im 29/43  vessel]
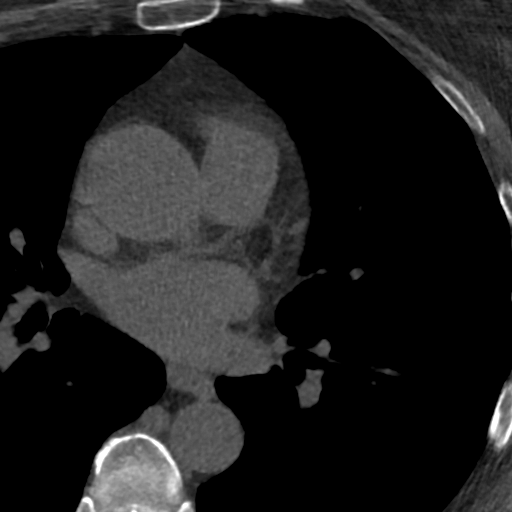
[im 33/43  vessel]
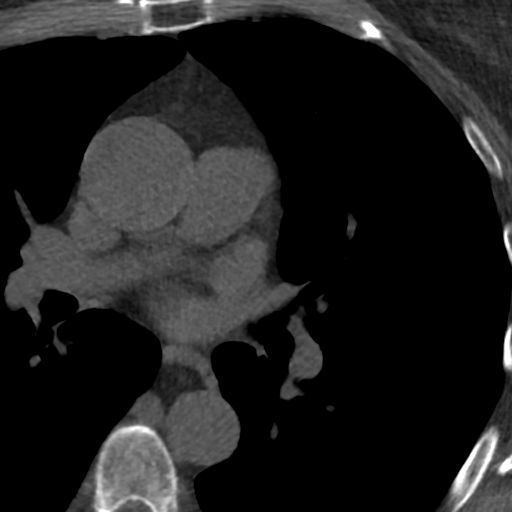
[im 38/43  vessel]
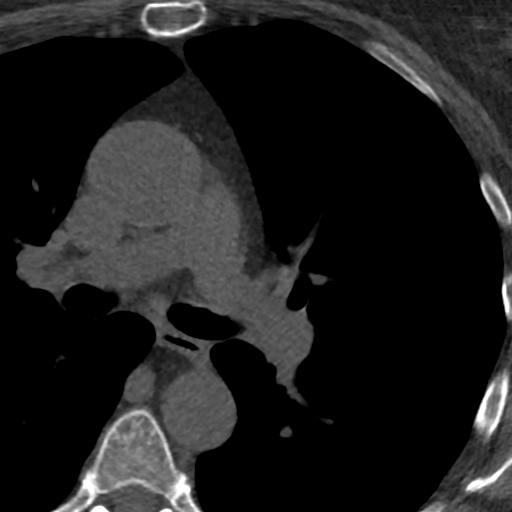

[Series 4: lung st 75 % · axial · 0.65mm/px · z∈[-234,-132]mm · 8 of 44 slices shown]
[im 5/44  lung]
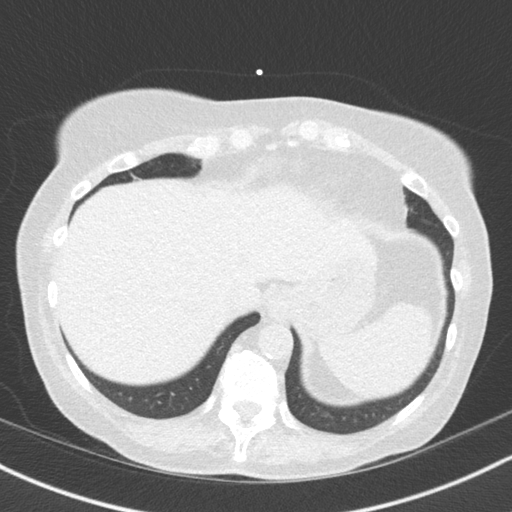
[im 10/44  lung]
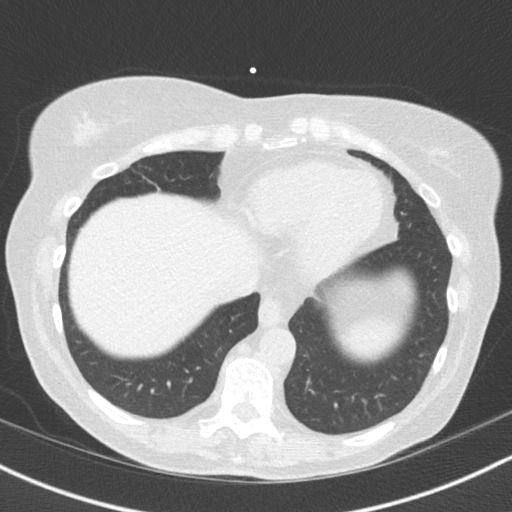
[im 15/44  lung]
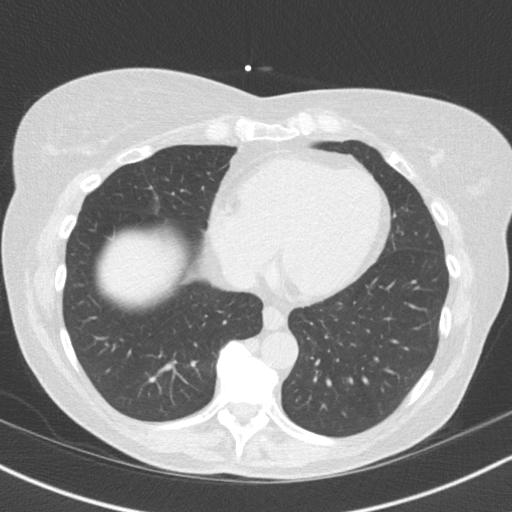
[im 20/44  lung]
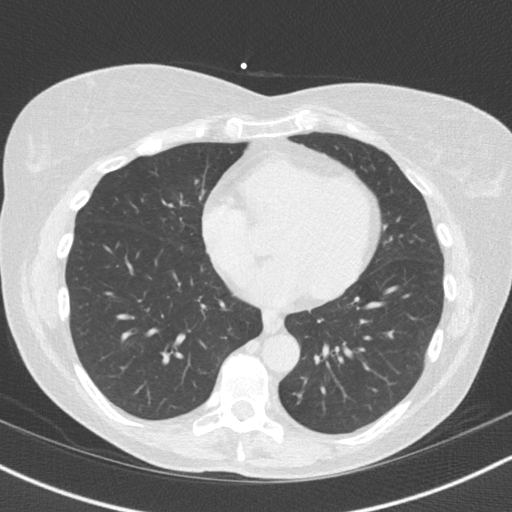
[im 24/44  lung]
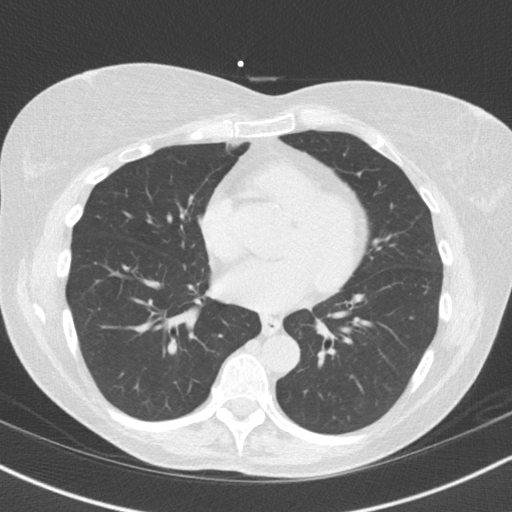
[im 29/44  lung]
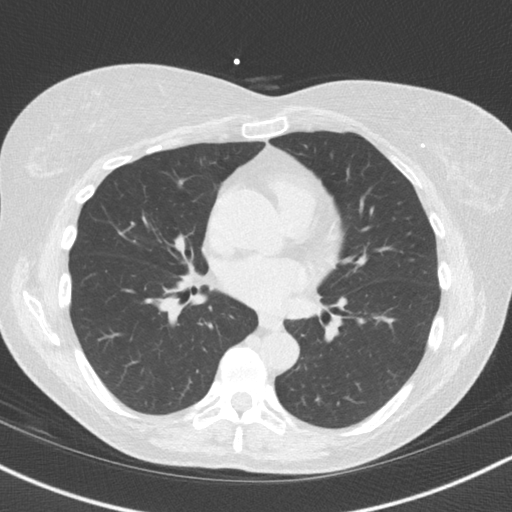
[im 34/44  lung]
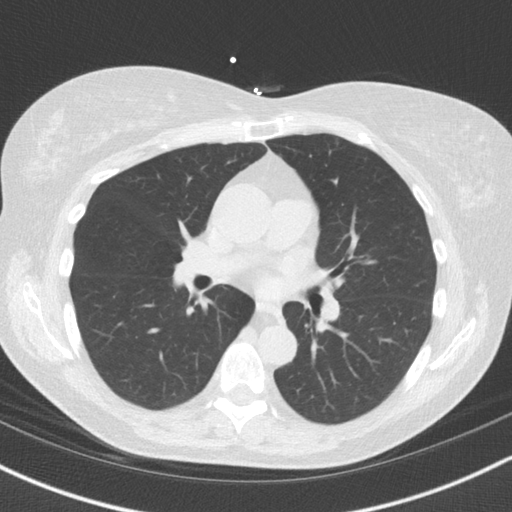
[im 39/44  lung]
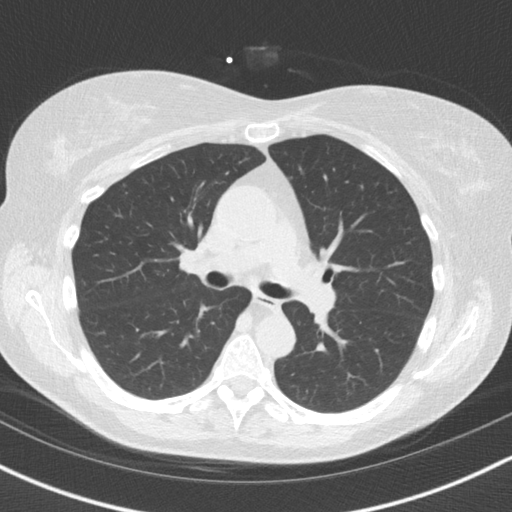

[16 of 20 positions shown; findings below may reference images not displayed]

FINDINGS: Vascular: Aortic atherosclerosis.

Mediastinum/Nodes: No imaged thoracic adenopathy.

Lungs/Pleura: No pleural fluid.  Clear imaged lungs

Upper Abdomen: Normal imaged portions of the liver, spleen, stomach.

Musculoskeletal: Nodularity in the inferior right breast at 1.6 cm
on image 36/4. No acute osseous abnormality.
IMPRESSION: 1.  No acute findings in the imaged extracardiac chest.
2. Apparent inferior right breast nodularity may represent
asymmetric breast parenchyma. Consider correlation with mammogram
and possibly ultrasound.

These results will be called to the ordering clinician or
representative by the Radiologist Assistant, and communication
documented in the PACS or zVision Dashboard.
FINDINGS: Non-cardiac: No significant non cardiac findings on limited lung and
soft tissue windows. See separate report from [REDACTED].

Ascending Aorta: Mild aortic root dilatation 3.9 cm

Pericardium: Normal

Coronary arteries: Calcium noted in proximal and mid LAD
IMPRESSION: Coronary calcium score of 43. This was 59 th percentile for age and
sex matched control.

[employer]

## 2019-03-19 IMAGING — XA Imaging study
1 series · 1 of 1 positions shown · non-contrast
Comparison: none

CLINICAL DATA: Lumbosacral spondylosis without myelopathy. Left hip
pain.

[Series 1: ortho standard · 1 of 1 slices shown]
[im 1/1]
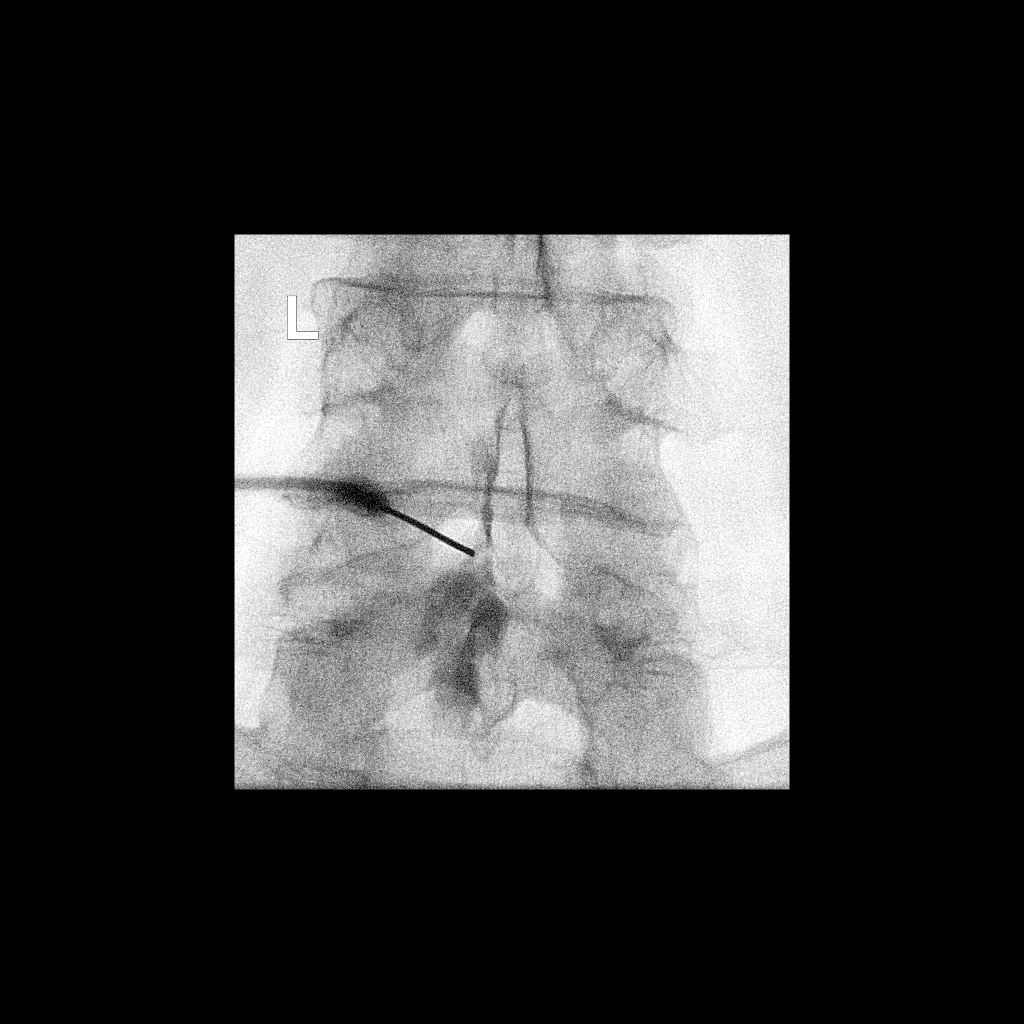

[1 of 1 positions shown; findings below may reference images not displayed]

FLUOROSCOPY TIME:  7 seconds.  0.7 mGy.

PROCEDURE:
The procedure, risks, benefits, and alternatives were explained to
the patient. Questions regarding the procedure were encouraged and
answered. The patient understands and consents to the procedure.

LUMBAR EPIDURAL INJECTION:

An interlaminar approach was performed on left at L4-5. The
overlying skin was cleansed and anesthetized. A 20 gauge epidural
needle was advanced using loss-of-resistance technique.

DIAGNOSTIC EPIDURAL INJECTION:

Injection of Isovue-M 200 shows a good epidural pattern with spread
above and below the level of needle placement, primarily on the left
no vascular opacification is seen.

THERAPEUTIC EPIDURAL INJECTION:

One hundred twenty mg of Depo-Medrol mixed with 3 cc 1% lidocaine
were instilled. The procedure was well-tolerated, and the patient
was discharged thirty minutes following the injection in good
condition.

COMPLICATIONS:
None
IMPRESSION: Technically successful epidural injection on the left L4-5 # 1.

## 2019-07-03 DIAGNOSIS — Z79890 Hormone replacement therapy (postmenopausal): Secondary | ICD-10-CM | POA: Diagnosis not present

## 2019-07-03 DIAGNOSIS — Z124 Encounter for screening for malignant neoplasm of cervix: Secondary | ICD-10-CM | POA: Diagnosis not present

## 2019-07-25 DIAGNOSIS — H16223 Keratoconjunctivitis sicca, not specified as Sjogren's, bilateral: Secondary | ICD-10-CM | POA: Diagnosis not present

## 2019-07-25 DIAGNOSIS — Z961 Presence of intraocular lens: Secondary | ICD-10-CM | POA: Diagnosis not present

## 2019-07-25 DIAGNOSIS — H25812 Combined forms of age-related cataract, left eye: Secondary | ICD-10-CM | POA: Diagnosis not present

## 2019-07-25 DIAGNOSIS — H18452 Nodular corneal degeneration, left eye: Secondary | ICD-10-CM | POA: Diagnosis not present

## 2019-07-31 MED FILL — ESTRADIOL 0.025 MG PATCH: 0.025 | 28 days supply | Qty: 8 | Fill #0

## 2019-08-24 DIAGNOSIS — Z23 Encounter for immunization: Secondary | ICD-10-CM | POA: Diagnosis not present

## 2019-09-20 DIAGNOSIS — Z1231 Encounter for screening mammogram for malignant neoplasm of breast: Secondary | ICD-10-CM | POA: Diagnosis not present

## 2019-10-02 DIAGNOSIS — N6312 Unspecified lump in the right breast, upper inner quadrant: Secondary | ICD-10-CM | POA: Diagnosis not present

## 2019-10-02 DIAGNOSIS — R921 Mammographic calcification found on diagnostic imaging of breast: Secondary | ICD-10-CM | POA: Diagnosis not present

## 2019-10-02 DIAGNOSIS — N6001 Solitary cyst of right breast: Secondary | ICD-10-CM | POA: Diagnosis not present

## 2019-10-02 DIAGNOSIS — M810 Age-related osteoporosis without current pathological fracture: Secondary | ICD-10-CM | POA: Diagnosis not present

## 2019-10-02 DIAGNOSIS — E7849 Other hyperlipidemia: Secondary | ICD-10-CM | POA: Diagnosis not present

## 2019-10-04 DIAGNOSIS — I10 Essential (primary) hypertension: Secondary | ICD-10-CM | POA: Diagnosis not present

## 2019-10-04 DIAGNOSIS — R82998 Other abnormal findings in urine: Secondary | ICD-10-CM | POA: Diagnosis not present

## 2019-10-17 DIAGNOSIS — Z1212 Encounter for screening for malignant neoplasm of rectum: Secondary | ICD-10-CM | POA: Diagnosis not present

## 2020-01-01 DIAGNOSIS — Z23 Encounter for immunization: Secondary | ICD-10-CM | POA: Diagnosis not present

## 2020-01-29 DIAGNOSIS — Z23 Encounter for immunization: Secondary | ICD-10-CM | POA: Diagnosis not present

## 2020-02-15 DIAGNOSIS — Z85828 Personal history of other malignant neoplasm of skin: Secondary | ICD-10-CM | POA: Diagnosis not present

## 2020-02-15 DIAGNOSIS — D2272 Melanocytic nevi of left lower limb, including hip: Secondary | ICD-10-CM | POA: Diagnosis not present

## 2020-02-15 DIAGNOSIS — D225 Melanocytic nevi of trunk: Secondary | ICD-10-CM | POA: Diagnosis not present

## 2020-02-15 DIAGNOSIS — L820 Inflamed seborrheic keratosis: Secondary | ICD-10-CM | POA: Diagnosis not present

## 2020-02-15 DIAGNOSIS — L814 Other melanin hyperpigmentation: Secondary | ICD-10-CM | POA: Diagnosis not present

## 2020-02-15 DIAGNOSIS — D1801 Hemangioma of skin and subcutaneous tissue: Secondary | ICD-10-CM | POA: Diagnosis not present

## 2020-02-15 DIAGNOSIS — L821 Other seborrheic keratosis: Secondary | ICD-10-CM | POA: Diagnosis not present

## 2020-02-15 DIAGNOSIS — D2262 Melanocytic nevi of left upper limb, including shoulder: Secondary | ICD-10-CM | POA: Diagnosis not present

## 2020-07-28 DIAGNOSIS — R300 Dysuria: Secondary | ICD-10-CM | POA: Diagnosis not present

## 2020-07-28 DIAGNOSIS — H16223 Keratoconjunctivitis sicca, not specified as Sjogren's, bilateral: Secondary | ICD-10-CM | POA: Diagnosis not present

## 2020-07-28 DIAGNOSIS — Z9889 Other specified postprocedural states: Secondary | ICD-10-CM | POA: Diagnosis not present

## 2020-07-28 DIAGNOSIS — Z961 Presence of intraocular lens: Secondary | ICD-10-CM | POA: Diagnosis not present

## 2020-07-28 DIAGNOSIS — H18452 Nodular corneal degeneration, left eye: Secondary | ICD-10-CM | POA: Diagnosis not present

## 2020-07-28 DIAGNOSIS — H25812 Combined forms of age-related cataract, left eye: Secondary | ICD-10-CM | POA: Diagnosis not present

## 2020-07-28 DIAGNOSIS — N390 Urinary tract infection, site not specified: Secondary | ICD-10-CM | POA: Diagnosis not present

## 2020-08-13 DIAGNOSIS — R102 Pelvic and perineal pain: Secondary | ICD-10-CM | POA: Diagnosis not present

## 2020-08-13 DIAGNOSIS — Z124 Encounter for screening for malignant neoplasm of cervix: Secondary | ICD-10-CM | POA: Diagnosis not present

## 2020-08-13 DIAGNOSIS — Z79890 Hormone replacement therapy (postmenopausal): Secondary | ICD-10-CM | POA: Diagnosis not present

## 2020-08-13 DIAGNOSIS — Z01419 Encounter for gynecological examination (general) (routine) without abnormal findings: Secondary | ICD-10-CM | POA: Diagnosis not present

## 2020-09-02 DIAGNOSIS — N202 Calculus of kidney with calculus of ureter: Secondary | ICD-10-CM | POA: Diagnosis not present

## 2020-09-02 DIAGNOSIS — R9389 Abnormal findings on diagnostic imaging of other specified body structures: Secondary | ICD-10-CM | POA: Diagnosis not present

## 2020-09-02 DIAGNOSIS — R102 Pelvic and perineal pain: Secondary | ICD-10-CM | POA: Diagnosis not present

## 2020-09-12 ENCOUNTER — Other Ambulatory Visit: Payer: Self-pay | Admitting: Anesthesiology

## 2020-09-12 ENCOUNTER — Ambulatory Visit
Admission: RE | Admit: 2020-09-12 | Discharge: 2020-09-12 | Disposition: A | Discharge status: Home / Self Care | Payer: Medicare Other | Source: Ambulatory Visit | Attending: Internal Medicine | Admitting: Internal Medicine

## 2020-09-12 ENCOUNTER — Other Ambulatory Visit: Payer: Self-pay

## 2020-09-12 ENCOUNTER — Other Ambulatory Visit: Payer: Self-pay | Admitting: Internal Medicine

## 2020-09-12 DIAGNOSIS — R109 Unspecified abdominal pain: Secondary | ICD-10-CM

## 2020-09-12 DIAGNOSIS — I480 Paroxysmal atrial fibrillation: Secondary | ICD-10-CM | POA: Diagnosis not present

## 2020-09-12 DIAGNOSIS — N390 Urinary tract infection, site not specified: Secondary | ICD-10-CM | POA: Diagnosis not present

## 2020-09-12 DIAGNOSIS — N3001 Acute cystitis with hematuria: Secondary | ICD-10-CM | POA: Diagnosis not present

## 2020-09-12 DIAGNOSIS — R319 Hematuria, unspecified: Secondary | ICD-10-CM | POA: Diagnosis not present

## 2020-09-12 DIAGNOSIS — D72829 Elevated white blood cell count, unspecified: Secondary | ICD-10-CM | POA: Diagnosis not present

## 2020-09-12 DIAGNOSIS — E785 Hyperlipidemia, unspecified: Secondary | ICD-10-CM | POA: Diagnosis not present

## 2020-09-12 DIAGNOSIS — R3129 Other microscopic hematuria: Secondary | ICD-10-CM | POA: Diagnosis not present

## 2020-09-12 DIAGNOSIS — R5381 Other malaise: Secondary | ICD-10-CM | POA: Diagnosis not present

## 2020-09-12 MED ORDER — IOPAMIDOL (ISOVUE-300) INJECTION 61%
100.0000 mL | Freq: Once | INTRAVENOUS | Status: AC | PRN
  Administered 2020-09-12: 100 mL via INTRAVENOUS

## 2020-09-14 DIAGNOSIS — I10 Essential (primary) hypertension: Secondary | ICD-10-CM | POA: Insufficient documentation

## 2020-09-14 DIAGNOSIS — R931 Abnormal findings on diagnostic imaging of heart and coronary circulation: Secondary | ICD-10-CM | POA: Insufficient documentation

## 2020-09-14 NOTE — Progress Notes (Signed)
Cardiology Office Note   Date:  09/16/2020   ID:  Mary Potter, DOB 10/04/1946, MRN [id number]  PCP:  Velna Hatchet, MD  Cardiologist:   Minus Breeding, MD   Chief Complaint  Patient presents with  . Atrial Fibrillation      History of Present Illness: Mary Potter is a 74 y.o. female who was referred by Velna Hatchet, MD for evaluation of an abnormal EKG and HTN.  She had chest pain and had a normal echo and negatie POET (Plain Old Exercise Treadmill).  She did have some mild calcium on CT.    She has had a urinary infection recently.  She is found to have a kidney stone which she passed.  She had recurrent urinary infection following this.  She just has not felt well during this entire time and was found by her primary provider late last week to be in atrial fibrillation she was given beta-blockers.  However, over the weekend she continued to have increased heart rates and felt poorly.  She went to the emergency room.  I reviewed these records.  It was reinforced that she should take scheduled every 12 hours metoprolol.  Enzymes were negative.  He was sent home from the emergency room encouraged to take anticoagulation but she wanted to talk to me about this and so was added to my schedule.  She feels palpitations but actually right now she is in sinus and let me know that she was not in fibrillation.  At times she has an increased heart rate but she wonders if she might be becoming anxious.  They show me a blood pressure diary and she has been more hypertensive recently although it is fluctuated.  She has had at least one episode of a heart rate of 140.  She thinks she is still not feeling well from a urinary infection.  She is fatigued.  She has not had any chest pressure, neck or arm discomfort.  He has not had any new shortness of breath, PND or orthopnea.  She had no weight gain or edema.  She has never had atrial fibrillation documented before.    Past Medical  History:  Diagnosis Date  . 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 Hyperlipidemia   . PONV (postoperative nausea and vomiting)   . Thickened endometrium 01/03/2015  . Vaginal delivery 1976, 1985    Past Surgical History:  Procedure Laterality Date  . ANKLE FRACTURE SURGERY    . DILATION AND CURETTAGE OF UTERUS  2012   ablation   . HYSTEROSCOPY WITH D & C N/A 01/03/2015   Procedure: DILATATION AND CURETTAGE /HYSTEROSCOPY;  Surgeon: [name], MD;  Location: [facility];  Service: Gynecology;  Laterality: N/A;  . TONSILLECTOMY AND ADENOIDECTOMY       Current Outpatient Medications  Medication Sig Dispense Refill  . apixaban (ELIQUIS) 5 MG TABS tablet Take 1 tablet (5 mg total) by mouth 2 (two) times daily. 60 tablet 0  . cholecalciferol (VITAMIN D) 1000 UNITS tablet Take 1,000 Units by mouth every evening.     . ciprofloxacin (CIPRO) 500 MG tablet Take 500 mg by mouth 2 (two) times daily.    Marland Kitchen estradiol (VIVELLE-DOT) 0.025 MG/24HR Place 1 patch onto the skin 2 (two) times a week. Wed & Saturdays  5  . metoprolol tartrate (LOPRESSOR) 25 MG tablet Take 25 mg by mouth in the morning and at bedtime.    . metroNIDAZOLE (FLAGYL) 500 MG tablet Take 500 mg by  mouth 3 (three) times daily.    Marland Kitchen olmesartan (BENICAR) 40 MG tablet Take 40 mg by mouth at bedtime.    . progesterone (PROMETRIUM) 100 MG capsule Take 100 mg by mouth every other day.   5  . rosuvastatin (CRESTOR) 10 MG tablet Take 10 mg by mouth 3 (three) times a week. MWF     No current facility-administered medications for this visit.    Allergies:   Talwin [pentazocine]    ROS:  Please see the history of present illness.   Otherwise, review of systems are positive for none.   All other systems are reviewed and negative.    PHYSICAL EXAM: VS:  BP (!) 150/85   Pulse 76   Temp (!) 96.4 F (35.8 C)   Ht 5\' 3"  (1.6 m)   Wt 152 lb 3.2 oz (69 kg)   SpO2 90%   BMI 26.96 kg/m  , BMI Body mass index is 26.96 kg/m. GENERAL:  Well  appearing NECK:  No jugular venous distention, waveform within normal limits, carotid upstroke brisk and symmetric, no bruits, no thyromegaly LUNGS:  Clear to auscultation bilaterally CHEST:  Unremarkable HEART:  PMI not displaced or sustained,S1 and S2 within normal limits, no S3, no S4, no clicks, no rubs, no murmurs ABD:  Flat, positive bowel sounds normal in frequency in pitch, no bruits, no rebound, no guarding, no midline pulsatile mass, no hepatomegaly, no splenomegaly EXT:  2 plus pulses throughout, no edema, no cyanosis no clubbing   EKG:  EKG is  ordered today. The ekg ordered today demonstrates sinus rhythm, rate 76, left ventricular hypertrophy by voltage criteria, left axis deviation, no acute ST-T wave changes.   Recent Labs: 09/15/2020: BUN 20; Creatinine, Ser 0.88; Hemoglobin 14.2; Platelets 314; Potassium 3.9; Sodium 140    Lipid Panel No results found for: CHOL, TRIG, HDL, CHOLHDL, VLDL, LDLCALC, LDLDIRECT    Wt Readings from Last 3 Encounters:  09/16/20 152 lb 3.2 oz (69 kg)  09/15/20 148 lb (67.1 kg)  06/29/18 157 lb (71.2 kg)      Other studies Reviewed: Additional studies/ records that were reviewed today include: ED records, primary care records, labs. Review of the above records demonstrates:  Please see elsewhere in the note.     ASSESSMENT AND PLAN:  HTN:  Her blood pressure is elevated and I am going to keep her on the metoprolol.  She will keep a blood pressure diary.  ATRIAL FIB: We had a long conversation about this.  This may have been provoked by the UTI and kidney stone.  We have agreed to stay off of anticoagulation as she would like to try to avoid this if possible.  However, she is going to get a device to record her EKG and if there are further documented episodes of atrial fibrillation she would agree to starting anticoagulation.  I will check a TSH.  She will let me know if she has fibrillation.  Otherwise we will see her back in a month to  further discuss this.  Of note she had previously a couple of years ago had an unremarkable echo.  Electrolytes in the emergency room and at her primary care office were otherwise unremarkable.   Current medicines are reviewed at length with the patient today.  The patient does not have concerns regarding medicines.  The following changes have been made:  no change  Labs/ tests ordered today include:   Orders Placed This Encounter  Procedures  . TSH  .  EKG 12-Lead     Disposition:   FU with me or APP in one month.    Signed, Minus Breeding, MD  09/16/2020 3:15 PM    [facility]

## 2020-09-15 ENCOUNTER — Encounter (HOSPITAL_COMMUNITY): Payer: Self-pay

## 2020-09-15 ENCOUNTER — Emergency Department (HOSPITAL_COMMUNITY): Payer: Medicare Other

## 2020-09-15 ENCOUNTER — Emergency Department (HOSPITAL_COMMUNITY)
Admission: EM | Admit: 2020-09-15 | Discharge: 2020-09-15 | Disposition: A | Discharge status: Home / Self Care | Payer: Medicare Other | Attending: Emergency Medicine | Admitting: Emergency Medicine

## 2020-09-15 ENCOUNTER — Other Ambulatory Visit: Payer: Self-pay

## 2020-09-15 DIAGNOSIS — I10 Essential (primary) hypertension: Secondary | ICD-10-CM | POA: Insufficient documentation

## 2020-09-15 DIAGNOSIS — Z79899 Other long term (current) drug therapy: Secondary | ICD-10-CM | POA: Diagnosis not present

## 2020-09-15 DIAGNOSIS — I4891 Unspecified atrial fibrillation: Secondary | ICD-10-CM | POA: Diagnosis not present

## 2020-09-15 LAB — CBC
HCT: 44.7 % (ref 36.0–46.0)
Hemoglobin: 14.2 g/dL (ref 12.0–15.0)
MCH: 28.6 pg (ref 26.0–34.0)
MCHC: 31.8 g/dL (ref 30.0–36.0)
MCV: 89.9 fL (ref 80.0–100.0)
Platelets: 314 10*3/uL (ref 150–400)
RBC: 4.97 MIL/uL (ref 3.87–5.11)
RDW: 14 % (ref 11.5–15.5)
WBC: 8.3 10*3/uL (ref 4.0–10.5)
nRBC: 0 % (ref 0.0–0.2)

## 2020-09-15 LAB — BASIC METABOLIC PANEL
Anion gap: 10 (ref 5–15)
BUN: 20 mg/dL (ref 8–23)
CO2: 26 mmol/L (ref 22–32)
Calcium: 9.4 mg/dL (ref 8.9–10.3)
Chloride: 104 mmol/L (ref 98–111)
Creatinine, Ser: 0.88 mg/dL (ref 0.44–1.00)
GFR, Estimated: >60 mL/min (ref >60)
Glucose, Bld: 154 mg/dL — ABNORMAL HIGH (ref 70–99)
Potassium: 3.9 mmol/L (ref 3.5–5.1)
Sodium: 140 mmol/L (ref 135–145)

## 2020-09-15 LAB — TROPONIN I (HIGH SENSITIVITY): Troponin I (High Sensitivity): 4 ng/L (ref <18)

## 2020-09-15 MED ORDER — METOPROLOL TARTRATE 5 MG/5ML IV SOLN
5.0000 mg | Freq: Once | INTRAVENOUS | Status: AC
  Administered 2020-09-15: 5 mg via INTRAVENOUS
  Filled 2020-09-15: qty 5

## 2020-09-15 MED ORDER — APIXABAN 5 MG PO TABS: 5.0000 mg | ORAL_TABLET | Freq: Two times a day (BID) | ORAL | 0 refills | Status: DC

## 2020-09-15 MED ORDER — METOPROLOL TARTRATE 25 MG PO TABS
25.0000 mg | ORAL_TABLET | Freq: Once | ORAL | Status: AC
  Administered 2020-09-15: 25 mg via ORAL
  Filled 2020-09-15: qty 1

## 2020-09-15 NOTE — ED Provider Notes (Addendum)
[facility] DEPT Provider Note   CSN: [id number] Arrival date & time: 09/15/20  1336     History Chief Complaint  Patient presents with  . Palpitations  . Hypertension    Mary Potter is a 74 y.o. female.  74yo F w/ PMH including HLD, HTN who p/w palpitations and weakness. Pt saw PCP 3 days ago and was started on cipro/flagyl for kidney infection and diverticulitis. She was noted to have new onset A fib. The PCP referred her to cardiology, she has an appointment tomorrow w/ Dr. Percival Spanish, and he gave her 25mg  metoprolol to use as needed for tachycardia. She has kept a log of BP and HR and has taken the metoprolol sporadically for tachycardia. She notes that as soon as the medication wears off, she starts getting hypertensive and tachycardic again. Last dose was 2am. She was not started on anticoagulation. She reports still feeling run down and fatigued w/ generalized weakness. No urinary symptoms, fevers, vomiting. She does continue to have diarrhea, non-bloody. No sick contacts. No CP or SOB. Denies h/o bleeding problems, recent head injury, or h/o GI bleeding.   The history is provided by the patient.  Palpitations Hypertension       Past Medical History:  Diagnosis Date  . 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 Hyperlipidemia   . PONV (postoperative nausea and vomiting)   . Thickened endometrium 01/03/2015  . Vaginal delivery 1976, 1985    Patient Active Problem List   Diagnosis Date Noted  . Elevated coronary artery calcium score 09/14/2020  . Essential hypertension 09/14/2020  . 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 Greater trochanteric bursitis of left hip 04/18/2018  . Acute left lumbar radiculopathy 03/21/2018  . Iliotibial band tendinitis of right side 09/30/2016  . Trigger finger, right middle finger 09/30/2016  . Thickened endometrium 01/03/2015  . 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 Tibialis  posterior tendonitis 05/09/2014  . H/O ankle fusion 05/09/2014  . Left leg pain 04/17/2014  . Lipid disorder 11/14/2012  . GERD 09/11/2010  . CONSTIPATION 09/11/2010  . DIARRHEA 09/11/2010    Past Surgical History:  Procedure Laterality Date  . ANKLE FRACTURE SURGERY    . DILATION AND CURETTAGE OF UTERUS  2012   ablation   . HYSTEROSCOPY WITH D & C N/A 01/03/2015   Procedure: DILATATION AND CURETTAGE /HYSTEROSCOPY;  Surgeon: [name], MD;  Location: [facility];  Service: Gynecology;  Laterality: N/A;  . TONSILLECTOMY AND ADENOIDECTOMY       OB History   No obstetric history on file.     Family History  Problem Relation Age of Onset  . Heart disease Mother 47       CABG  . Stroke Father   . Heart disease Brother 1       Transplant, died 47 years  . Heart disease Maternal Grandmother   . Cancer Maternal Grandfather   . Heart disease Paternal Grandmother   . Cancer Paternal Grandfather   . Heart disease Brother 12       RF  . Leukemia Brother   . Leukemia Brother   . Hypertension Sister     Social History   Tobacco Use  . Smoking status: Never Smoker  . Smokeless tobacco: Never Used  Substance Use Topics  . Alcohol use: No    Alcohol/week: 0.0 standard drinks  . Drug use: No    Home Medications Prior to Admission medications   Medication Sig Start Date  End Date Taking? Authorizing Provider  cholecalciferol (VITAMIN D) 1000 UNITS tablet Take 1,000 Units by mouth every evening.    Yes [provider]  ciprofloxacin (CIPRO) 500 MG tablet Take 500 mg by mouth 2 (two) times daily. 09/12/20  Yes [provider]  estradiol (VIVELLE-DOT) 0.025 MG/24HR Place 1 patch onto the skin 2 (two) times a week. Wed & Saturdays 02/21/18  Yes [provider]  metoprolol tartrate (LOPRESSOR) 25 MG tablet Take 25 mg by mouth 2 (two) times daily as needed (HR>130).  09/12/20  Yes [provider]  metroNIDAZOLE (FLAGYL) 500 MG tablet Take 500 mg by  mouth 3 (three) times daily. 09/12/20  Yes [provider]  olmesartan (BENICAR) 40 MG tablet Take 40 mg by mouth at bedtime.   Yes [provider]  progesterone (PROMETRIUM) 100 MG capsule Take 100 mg by mouth every other day.  01/31/18  Yes [provider]  rosuvastatin (CRESTOR) 10 MG tablet Take 10 mg by mouth 3 (three) times a week. MWF   Yes [provider]  gabapentin (NEURONTIN) 100 MG capsule Take 2 capsules (200 mg total) by mouth at bedtime. Patient not taking: Reported on 09/15/2020 03/21/18   Lyndal Pulley, DO  meloxicam ([place]) 15 MG tablet TAKE 1 TABLET BY MOUTH ONCE DAILY Patient not taking: Reported on 09/15/2020 11/23/18   Lyndal Pulley, DO    Allergies    Talwin [pentazocine]  Review of Systems   Review of Systems  Cardiovascular: Positive for palpitations.   All other systems reviewed and are negative except that which was mentioned in HPI  Physical Exam Updated Vital Signs BP 103/85   Pulse 80   Temp 98 F (36.7 C) (Oral)   Resp 18   Ht 5' 3.5" (1.613 m)   Wt 67.1 kg   SpO2 99%   BMI 25.81 kg/m   Physical Exam Vitals and nursing note reviewed.  Constitutional:      General: She is not in acute distress.    Appearance: She is well-developed.  HENT:     Head: Normocephalic and atraumatic.  Eyes:     Conjunctiva/sclera: Conjunctivae normal.  Cardiovascular:     Rate and Rhythm: Tachycardia present. Rhythm irregularly irregular.     Heart sounds: Normal heart sounds. No murmur heard.   Pulmonary:     Effort: Pulmonary effort is normal.     Breath sounds: Normal breath sounds.  Abdominal:     General: Bowel sounds are normal. There is no distension.     Palpations: Abdomen is soft.     Tenderness: There is no abdominal tenderness.  Musculoskeletal:     Cervical back: Neck supple.  Skin:    General: Skin is warm and dry.  Neurological:     Mental Status: She is alert and oriented to person, place, and time.       Comments: Fluent speech  Psychiatric:        Judgment: Judgment normal.     ED Results / Procedures / Treatments   Labs (all labs ordered are listed, but only abnormal results are displayed) Labs Reviewed  BASIC METABOLIC PANEL - Abnormal; Notable for the following components:      Result Value   Glucose, Bld 154 (*)    All other components within normal limits  CBC  TROPONIN I (HIGH SENSITIVITY)  TROPONIN I (HIGH SENSITIVITY)    EKG EKG Interpretation  Date/Time:  Monday September 15 2020 13:51:57 EST Ventricular Rate:  105 PR Interval:    QRS Duration: 88 QT Interval:  342 QTC Calculation: 452 R Axis:   -52 Text Interpretation: Sinus tachycardia Left anterior fascicular block Abnormal R-wave progression, early transition Left ventricular hypertrophy Anterior Q waves, possibly due to LVH 12 Lead; Mason-Likar tachycardia new from previous Confirmed by Theotis Burrow [phone]) on 09/15/2020 2:54:41 PM   Radiology No results found.  Procedures Procedures (including critical care time) CRITICAL CARE Performed by: Wenda Overland Tajai Ihde   Total critical care time: 30 minutes  Critical care time was exclusive of separately billable procedures and treating other patients.  Critical care was necessary to treat or prevent imminent or life-threatening deterioration.  Critical care was time spent personally by me on the following activities: development of treatment plan with patient and/or surrogate as well as nursing, discussions with consultants, evaluation of patient's response to treatment, examination of patient, obtaining history from patient or surrogate, ordering and performing treatments and interventions, ordering and review of laboratory studies, ordering and review of radiographic studies, pulse oximetry and re-evaluation of patient's condition.  Medications Ordered in ED Medications  metoprolol tartrate (LOPRESSOR) injection 5 mg (5 mg Intravenous Given 09/15/20  1546)  metoprolol tartrate (LOPRESSOR) tablet 25 mg (25 mg Oral Given 09/15/20 1546)    ED Course  I have reviewed the triage vital signs and the nursing notes.  Pertinent labs & imaging results that were available during my care of the patient were reviewed by me and considered in my medical decision making (see chart for details).  This patients CHA2DS2-VASc Score and unadjusted Ischemic Stroke Rate (% per year) is equal to 3.2 % stroke rate/year from a score of 3  Above score calculated as 1 point each if present [CHF, HTN, DM, Vascular=MI/PAD/Aortic Plaque, Age if 65-74, or Female] Above score calculated as 2 points each if present [Age > 75, or Stroke/TIA/TE]    MDM Rules/Calculators/A&P                          PT comfortable on exam, tachycardic, A fib w/ RVR on monitor. I explained that she would need to take metop q 12h for better HR control. It appears based on log that she's having good response after medication doses and BP is doing well on metop. I discussed CHADSVASC score of 3 and recommended initiation of anticoagulation based on stroke risk. She has cardiology f/u tomorrow. Gave oral and IV metop with good rate control here. Basic labs and trop reassuring. I have extensively educated on Afib treatment, f/u plan and return precautions and pt voiced understanding.  Final Clinical Impression(s) / ED Diagnoses Final diagnoses:  None    Rx / DC Orders ED Discharge Orders    None       Guillaume Weninger, Wenda Overland, MD 09/15/20 [place], Wenda Overland, MD 09/15/20 [phone]

## 2020-09-15 NOTE — Discharge Instructions (Signed)
Do not take ibuprofen, aleve, advil, motrin, aspirin while taking Eliquis. You may take tylenol for pain. Take metoprolol 25mg  twice daily. You may take an extra dose overnight if you have fast heart rate. Follow up with cardiology. Return to ER if any chest pain, breathing problems, severely fast heart rate, or passing out. Or for any breathing problems or major trauma.

## 2020-09-15 NOTE — ED Triage Notes (Signed)
Pt arrived via walk in, c/o intermittent palpitations and HTN. States she recently had a kidney infection, was seen, told she had new onset afib, given metoprolol, but only enough for 5 days. States she has keeping log of HR and BP at home, elevated several times last night. PCP advised pt to go to ED.

## 2020-09-16 ENCOUNTER — Other Ambulatory Visit: Payer: Self-pay

## 2020-09-16 ENCOUNTER — Ambulatory Visit (INDEPENDENT_AMBULATORY_CARE_PROVIDER_SITE_OTHER): Payer: Medicare Other | Admitting: Cardiology

## 2020-09-16 ENCOUNTER — Other Ambulatory Visit: Payer: Self-pay | Admitting: Cardiology

## 2020-09-16 ENCOUNTER — Encounter: Payer: Self-pay | Admitting: Cardiology

## 2020-09-16 VITALS — BP 150/85 | HR 76 | Temp 96.4°F | Ht 63.0 in | Wt 152.2 lb

## 2020-09-16 DIAGNOSIS — R931 Abnormal findings on diagnostic imaging of heart and coronary circulation: Secondary | ICD-10-CM

## 2020-09-16 DIAGNOSIS — I10 Essential (primary) hypertension: Secondary | ICD-10-CM | POA: Diagnosis not present

## 2020-09-16 DIAGNOSIS — R002 Palpitations: Secondary | ICD-10-CM | POA: Diagnosis not present

## 2020-09-16 MED ORDER — METOPROLOL TARTRATE 25 MG PO TABS
25.0000 mg | ORAL_TABLET | Freq: Two times a day (BID) | ORAL | 3 refills | Status: DC
Start: 2020-09-16 — End: 2020-09-16

## 2020-09-16 MED ORDER — METOPROLOL TARTRATE 25 MG PO TABS
25.0000 mg | ORAL_TABLET | Freq: Two times a day (BID) | ORAL | 0 refills | Status: DC
Start: 2020-09-16 — End: 2020-09-18

## 2020-09-16 NOTE — Telephone Encounter (Signed)
*  STAT* If patient is at the pharmacy, call can be transferred to refill team.   1. Which medications need to be refilled? (please list name of each medication and dose if known)  metoprolol tartrate (LOPRESSOR) 25 MG tablet  2. Which pharmacy/location (including street and city if local pharmacy) is medication to be sent to? [id number] - , [place] [address]  3. Do they need a 30 day or 90 day supply? 12 tablets.  Patient only has 3 tablets left and the mail order prescription won't arrive for another 7 days.

## 2020-09-16 NOTE — Patient Instructions (Addendum)
Medication Instructions:  TAKE YOUR METOPROLOL 25 MG TWICE A DAY *If you need a refill on your cardiac medications before your next appointment, please call your pharmacy*   Lab Work: TSH in our office today If you have labs (blood work) drawn today and your tests are completely normal, you will receive your results only by: Marland Kitchen MyChart Message (if you have MyChart) OR . A paper copy in the mail If you have any lab test that is abnormal or we need to change your treatment, we will call you to review the results.   Testing/Procedures: None ordered   Follow-Up: At [facility], you and your health needs are our priority.  As part of our continuing mission to provide you with exceptional heart care, we have created designated Provider Care Teams.  These Care Teams include your primary Cardiologist (physician) and Advanced Practice Providers (APPs -  Physician Assistants and Nurse Practitioners) who all work together to provide you with the care you need, when you need it.  We recommend signing up for the patient portal called "MyChart".  Sign up information is provided on this After Visit Summary.  MyChart is used to connect with patients for Virtual Visits (Telemedicine).  Patients are able to view lab/test results, encounter notes, upcoming appointments, etc.  Non-urgent messages can be sent to your provider as well.   To learn more about what you can do with MyChart, go to [email or web address].    Your next appointment:   1 month(s)  The format for your next appointment:   In Person  Provider:   You may see Minus Breeding, MD or one of the following Advanced Practice Providers on your designated Care Team:    Rosaria Ferries, PA-C  Jory Sims, DNP, ANP    Other Instructions AliveCor by Jodelle Red is the name of an at-home EKG monitoring device that Dr. Percival Spanish recommends.

## 2020-09-16 NOTE — Progress Notes (Signed)
Medication refill

## 2020-09-17 LAB — TSH: TSH: 1.54 u[IU]/mL (ref 0.450–4.500)

## 2020-09-18 ENCOUNTER — Other Ambulatory Visit: Payer: Self-pay | Admitting: Cardiology

## 2020-09-18 ENCOUNTER — Ambulatory Visit (HOSPITAL_COMMUNITY)
Admission: RE | Admit: 2020-09-18 | Discharge: 2020-09-18 | Disposition: A | Discharge status: Home / Self Care | Payer: Medicare Other | Source: Ambulatory Visit | Attending: Nurse Practitioner | Admitting: Nurse Practitioner

## 2020-09-18 ENCOUNTER — Encounter (HOSPITAL_COMMUNITY): Payer: Self-pay | Admitting: Nurse Practitioner

## 2020-09-18 ENCOUNTER — Other Ambulatory Visit: Payer: Self-pay

## 2020-09-18 VITALS — BP 122/90 | HR 118 | Ht 63.0 in | Wt 151.4 lb

## 2020-09-18 DIAGNOSIS — I10 Essential (primary) hypertension: Secondary | ICD-10-CM | POA: Diagnosis not present

## 2020-09-18 DIAGNOSIS — I444 Left anterior fascicular block: Secondary | ICD-10-CM | POA: Diagnosis not present

## 2020-09-18 DIAGNOSIS — I4891 Unspecified atrial fibrillation: Secondary | ICD-10-CM | POA: Insufficient documentation

## 2020-09-18 DIAGNOSIS — D6869 Other thrombophilia: Secondary | ICD-10-CM

## 2020-09-18 MED ORDER — METOPROLOL TARTRATE 50 MG PO TABS: 50.0000 mg | ORAL_TABLET | Freq: Two times a day (BID) | ORAL | 3 refills | Status: DC

## 2020-09-18 MED ORDER — APIXABAN 5 MG PO TABS: 5.0000 mg | ORAL_TABLET | Freq: Two times a day (BID) | ORAL | 3 refills | Status: DC

## 2020-09-18 MED ORDER — METOPROLOL TARTRATE 25 MG PO TABS
50.0000 mg | ORAL_TABLET | Freq: Two times a day (BID) | ORAL | 0 refills | Status: DC
Start: 2020-09-18 — End: 2020-09-18

## 2020-09-18 NOTE — Telephone Encounter (Signed)
Almyra Deforest, PA-C reviewed pt's Kardia EKG and confirmed recurrence of afib. Metoprolol tartrate increased to 50 mg bid. Pt scheduled in Afib clinic for today at 3 p.m. Called pt and confirmed instructions. All questions answered. Pt verbalizes understanding.

## 2020-09-18 NOTE — Patient Instructions (Signed)
Start Eliquis 5mg  twice a day  Start metoprolol 50mg  twice a day

## 2020-09-19 ENCOUNTER — Encounter (HOSPITAL_COMMUNITY): Payer: Self-pay | Admitting: Nurse Practitioner

## 2020-09-19 NOTE — Progress Notes (Signed)
Primary Care Physician: Velna Hatchet, MD Referring Physician: Dr. Harrold Donath Mary Potter is a 74 y.o. female with a h/o HTN, elevated calcium score that is in the afib clinic for evaluation for  new onset afib. This is in the setting of an UTI , kidney stone and diverticulitis and is currently on 2 antibiotics. She was seen in the ER 11/15 for palpitations and weakness, was started then on flagyl and cipro. She was discharged on metoprolol 25 mg bid(she had prior to ER visit but was using prn palpitations) and eliquis. She saw Dr. Stanford Breed 11/16 and was in [place] so eliquis was not started. She  then noted afib again this am, was told to increase BB to 50 mg bid( has not increased yet)  and was referred here. She remains in afib. CHA2DS2VASc score of at least 3. By guidelines, she should start anticoagulation. Denies any bleeding history. No significant alcohol use, no tobacco, no significant caffeine use, denies  snoring .  Today, she denies symptoms of palpitations, chest pain, shortness of breath, orthopnea, PND, lower extremity edema, dizziness, presyncope, syncope, or neurologic sequela. The patient is tolerating medications without difficulties and is otherwise without complaint today.   Past Medical History:  Diagnosis Date  . 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 Hyperlipidemia   . PONV (postoperative nausea and vomiting)   . Thickened endometrium 01/03/2015  . Vaginal delivery 1976, 1985   Past Surgical History:  Procedure Laterality Date  . ANKLE FRACTURE SURGERY    . DILATION AND CURETTAGE OF UTERUS  2012   ablation   . HYSTEROSCOPY WITH D & C N/A 01/03/2015   Procedure: DILATATION AND CURETTAGE /HYSTEROSCOPY;  Surgeon: [name], MD;  Location: [facility];  Service: Gynecology;  Laterality: N/A;  . TONSILLECTOMY AND ADENOIDECTOMY      Current Outpatient Medications  Medication Sig Dispense Refill  . apixaban (ELIQUIS) 5 MG TABS tablet Take 1 tablet (5 mg total) by mouth 2  (two) times daily. 60 tablet 3  . cholecalciferol (VITAMIN D) 1000 UNITS tablet Take 1,000 Units by mouth every evening.     . ciprofloxacin (CIPRO) 500 MG tablet Take 500 mg by mouth 2 (two) times daily.    Marland Kitchen estradiol (VIVELLE-DOT) 0.025 MG/24HR Place 1 patch onto the skin 2 (two) times a week. Wed & Saturdays  5  . metroNIDAZOLE (FLAGYL) 500 MG tablet Take 500 mg by mouth 3 (three) times daily.    Marland Kitchen olmesartan (BENICAR) 40 MG tablet Take 40 mg by mouth at bedtime.    . progesterone (PROMETRIUM) 100 MG capsule Take 100 mg by mouth every other day.   5  . rosuvastatin (CRESTOR) 10 MG tablet Take 10 mg by mouth 3 (three) times a week. MWF    . metoprolol tartrate (LOPRESSOR) 50 MG tablet Take 1 tablet (50 mg total) by mouth 2 (two) times daily. 60 tablet 3   No current facility-administered medications for this encounter.    Allergies  Allergen Reactions  . Talwin [Pentazocine] Anaphylaxis    Social History   Socioeconomic History  . Marital status: Widowed    Spouse name: [name]  . Number of children: 2  . Years of education: 23  . Highest education level: Not on file  Occupational History  . Occupation: Radio producer Lead    Comment: [employer]  Tobacco Use  . Smoking status: Never Smoker  . Smokeless tobacco: Never Used  Substance and Sexual Activity  . Alcohol use: No  Alcohol/week: 0.0 standard drinks  . Drug use: No  . Sexual activity: Not on file  Other Topics Concern  . Not on file  Social History Narrative   Lives with her younger daughter, [name]. Her older daughter lives in [place] with her family.   Social Determinants of Health   Financial Resource Strain:   . Difficulty of Paying Living Expenses: Not on file  Food Insecurity:   . Worried About Charity fundraiser in the Last Year: Not on file  . Ran Out of Food in the Last Year: Not on file  Transportation Needs:   . Lack of Transportation (Medical): Not on file  . Lack of Transportation  (Non-Medical): Not on file  Physical Activity:   . Days of Exercise per Week: Not on file  . Minutes of Exercise per Session: Not on file  Stress:   . Feeling of Stress : Not on file  Social Connections:   . Frequency of Communication with Friends and Family: Not on file  . Frequency of Social Gatherings with Friends and Family: Not on file  . Attends Religious Services: Not on file  . Active Member of Clubs or Organizations: Not on file  . Attends Archivist Meetings: Not on file  . Marital Status: Not on file  Intimate Partner Violence:   . Fear of Current or Ex-Partner: Not on file  . Emotionally Abused: Not on file  . Physically Abused: Not on file  . Sexually Abused: Not on file    Family History  Problem Relation Age of Onset  . Heart disease Mother 72       CABG  . Stroke Father   . Heart disease Brother 35       Transplant, died 69 years  . Heart disease Maternal Grandmother   . Cancer Maternal Grandfather   . Heart disease Paternal Grandmother   . Cancer Paternal Grandfather   . Heart disease Brother 12       RF  . Leukemia Brother   . Leukemia Brother   . Hypertension Sister     ROS- All systems are reviewed and negative except as per the HPI above  Physical Exam: Vitals:   09/18/20 1508  BP: 122/90  Pulse: (!) 118  Weight: 68.7 kg  Height: 5\' 3"  (1.6 m)   Wt Readings from Last 3 Encounters:  09/18/20 68.7 kg  09/16/20 69 kg  09/15/20 67.1 kg    Labs: Lab Results  Component Value Date   NA 140 09/15/2020   K 3.9 09/15/2020   CL 104 09/15/2020   CO2 26 09/15/2020   GLUCOSE 154 (H) 09/15/2020   BUN 20 09/15/2020   CREATININE 0.88 09/15/2020   CALCIUM 9.4 09/15/2020   No results found for: INR No results found for: CHOL, HDL, LDLCALC, TRIG   GEN- The patient is well appearing, alert and oriented x 3 today.   Head- normocephalic, atraumatic Eyes-  Sclera clear, conjunctiva pink Ears- hearing intact Oropharynx- clear Neck-  supple, no JVP Lymph- no cervical lymphadenopathy Lungs- Clear to ausculation bilaterally, normal work of breathing Heart- irregular rate and rhythm, no murmurs, rubs or gallops, PMI not laterally displaced GI- soft, NT, ND, + BS Extremities- no clubbing, cyanosis, or edema MS- no significant deformity or atrophy Skin- no rash or lesion Psych- euthymic mood, full affect Neuro- strength and sensation are intact  EKG-afib at 118 bpm, qrs int 82 ms, qtc 456 ms, LAFB   Echo- 2019-Study  Conclusions   - Left ventricle: The cavity size was normal. Wall thickness was  increased in a pattern of mild LVH. Systolic function was normal.  The estimated ejection fraction was in the range of 60% to 65%.  - Mitral valve: There was mild regurgitation.   Assessment and Plan: 1. New onset afib General discussion re afib and triggers Pt most likely trigger recently is infection  She will increase metoprolol to 50 mg bid as earlier ordered by Dr. Percival Spanish  2. CHA2DS2VASc score of 3  She will start eliquis 5 mg bid  Denies bleeding history Avoid antiinflammatories Tylenol as needed for pain  Bleeding precautions discussed    I will see pt back 12/2, sooner if needed   Butch Penny C. Naheim Burgen, [facility] [address] [place], [place] [address] [phone]

## 2020-09-23 DIAGNOSIS — R3121 Asymptomatic microscopic hematuria: Secondary | ICD-10-CM | POA: Diagnosis not present

## 2020-09-23 DIAGNOSIS — R350 Frequency of micturition: Secondary | ICD-10-CM | POA: Diagnosis not present

## 2020-10-02 ENCOUNTER — Ambulatory Visit (HOSPITAL_COMMUNITY)
Admission: RE | Admit: 2020-10-02 | Discharge: 2020-10-02 | Disposition: A | Discharge status: Home / Self Care | Payer: Medicare Other | Source: Ambulatory Visit | Attending: Nurse Practitioner | Admitting: Nurse Practitioner

## 2020-10-02 ENCOUNTER — Other Ambulatory Visit: Payer: Self-pay

## 2020-10-02 ENCOUNTER — Encounter (HOSPITAL_COMMUNITY): Payer: Self-pay | Admitting: Nurse Practitioner

## 2020-10-02 VITALS — BP 142/78 | HR 68 | Ht 63.0 in | Wt 153.6 lb

## 2020-10-02 DIAGNOSIS — Z79899 Other long term (current) drug therapy: Secondary | ICD-10-CM | POA: Insufficient documentation

## 2020-10-02 DIAGNOSIS — I10 Essential (primary) hypertension: Secondary | ICD-10-CM | POA: Insufficient documentation

## 2020-10-02 DIAGNOSIS — K5792 Diverticulitis of intestine, part unspecified, without perforation or abscess without bleeding: Secondary | ICD-10-CM | POA: Diagnosis not present

## 2020-10-02 DIAGNOSIS — N200 Calculus of kidney: Secondary | ICD-10-CM | POA: Diagnosis not present

## 2020-10-02 DIAGNOSIS — I4891 Unspecified atrial fibrillation: Secondary | ICD-10-CM | POA: Diagnosis not present

## 2020-10-02 DIAGNOSIS — Z7901 Long term (current) use of anticoagulants: Secondary | ICD-10-CM | POA: Diagnosis not present

## 2020-10-02 DIAGNOSIS — D6869 Other thrombophilia: Secondary | ICD-10-CM

## 2020-10-02 NOTE — Progress Notes (Signed)
Primary Care Physician: Velna Hatchet, MD Referring Physician: Dr. Harrold Donath Mary Potter is a 74 y.o. female with a h/o HTN, elevated calcium score that is in the afib clinic for evaluation for  new onset afib. This is in the setting of an UTI , kidney stone and diverticulitis and is currently on 2 antibiotics. She was seen in the ER 11/15 for palpitations and weakness, was started then on flagyl and cipro. She was discharged on metoprolol 25 mg bid(she had prior to ER visit but was using prn palpitations) and eliquis. She saw Dr. Stanford Breed 11/16 and was in [place] so eliquis was not started. She  then noted afib again this am, was told to increase BB to 50 mg bid( has not increased yet)  and was referred here. She remains in afib. CHA2DS2VASc score of at least 3. By guidelines, she should start anticoagulation. Denies any bleeding history. No significant alcohol use, no tobacco, no significant caffeine use, denies  snoring .  F/u in afib clinic, 10/02/20. She is in [place], just had one episodes Sunday in the middle of the night. It was not sustained. She continues on the BB and DOAC without issues.   Today, she denies symptoms of palpitations, chest pain, shortness of breath, orthopnea, PND, lower extremity edema, dizziness, presyncope, syncope, or neurologic sequela. The patient is tolerating medications without difficulties and is otherwise without complaint today.   Past Medical History:  Diagnosis Date  . 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 Hyperlipidemia   . PONV (postoperative nausea and vomiting)   . Thickened endometrium 01/03/2015  . Vaginal delivery 1976, 1985   Past Surgical History:  Procedure Laterality Date  . ANKLE FRACTURE SURGERY    . DILATION AND CURETTAGE OF UTERUS  2012   ablation   . HYSTEROSCOPY WITH D & C N/A 01/03/2015   Procedure: DILATATION AND CURETTAGE /HYSTEROSCOPY;  Surgeon: [name], MD;  Location: [facility];  Service: Gynecology;  Laterality: N/A;  .  TONSILLECTOMY AND ADENOIDECTOMY      Current Outpatient Medications  Medication Sig Dispense Refill  . apixaban (ELIQUIS) 5 MG TABS tablet Take 1 tablet (5 mg total) by mouth 2 (two) times daily. 60 tablet 3  . cholecalciferol (VITAMIN D) 1000 UNITS tablet Take 1,000 Units by mouth every evening.     Marland Kitchen estradiol (VIVELLE-DOT) 0.025 MG/24HR Place 1 patch onto the skin 2 (two) times a week. Wed & Saturdays  5  . metoprolol tartrate (LOPRESSOR) 50 MG tablet Take 1 tablet (50 mg total) by mouth 2 (two) times daily. 60 tablet 3  . olmesartan (BENICAR) 40 MG tablet Take 40 mg by mouth at bedtime.    . progesterone (PROMETRIUM) 100 MG capsule Take 100 mg by mouth every other day.   5  . rosuvastatin (CRESTOR) 10 MG tablet Take 10 mg by mouth 3 (three) times a week. MWF     No current facility-administered medications for this encounter.    Allergies  Allergen Reactions  . Talwin [Pentazocine] Anaphylaxis    Social History   Socioeconomic History  . Marital status: Widowed    Spouse name: [name]  . Number of children: 2  . Years of education: 82  . Highest education level: Not on file  Occupational History  . Occupation: Radio producer Lead    Comment: [employer]  Tobacco Use  . Smoking status: Never Smoker  . Smokeless tobacco: Never Used  Substance and Sexual Activity  . Alcohol use: No  Alcohol/week: 0.0 standard drinks  . Drug use: No  . Sexual activity: Not on file  Other Topics Concern  . Not on file  Social History Narrative   Lives with her younger daughter, [name]. Her older daughter lives in [place] with her family.   Social Determinants of Health   Financial Resource Strain:   . Difficulty of Paying Living Expenses: Not on file  Food Insecurity:   . Worried About Charity fundraiser in the Last Year: Not on file  . Ran Out of Food in the Last Year: Not on file  Transportation Needs:   . Lack of Transportation (Medical): Not on file  . Lack of Transportation  (Non-Medical): Not on file  Physical Activity:   . Days of Exercise per Week: Not on file  . Minutes of Exercise per Session: Not on file  Stress:   . Feeling of Stress : Not on file  Social Connections:   . Frequency of Communication with Friends and Family: Not on file  . Frequency of Social Gatherings with Friends and Family: Not on file  . Attends Religious Services: Not on file  . Active Member of Clubs or Organizations: Not on file  . Attends Archivist Meetings: Not on file  . Marital Status: Not on file  Intimate Partner Violence:   . Fear of Current or Ex-Partner: Not on file  . Emotionally Abused: Not on file  . Physically Abused: Not on file  . Sexually Abused: Not on file    Family History  Problem Relation Age of Onset  . Heart disease Mother 55       CABG  . Stroke Father   . Heart disease Brother 61       Transplant, died 60 years  . Heart disease Maternal Grandmother   . Cancer Maternal Grandfather   . Heart disease Paternal Grandmother   . Cancer Paternal Grandfather   . Heart disease Brother 12       RF  . Leukemia Brother   . Leukemia Brother   . Hypertension Sister     ROS- All systems are reviewed and negative except as per the HPI above  Physical Exam: Vitals:   10/02/20 1118  BP: (!) 142/78  Pulse: 68  Weight: 69.7 kg  Height: 5\' 3"  (1.6 m)   Wt Readings from Last 3 Encounters:  10/02/20 69.7 kg  09/18/20 68.7 kg  09/16/20 69 kg    Labs: Lab Results  Component Value Date   NA 140 09/15/2020   K 3.9 09/15/2020   CL 104 09/15/2020   CO2 26 09/15/2020   GLUCOSE 154 (H) 09/15/2020   BUN 20 09/15/2020   CREATININE 0.88 09/15/2020   CALCIUM 9.4 09/15/2020   No results found for: INR No results found for: CHOL, HDL, LDLCALC, TRIG   GEN- The patient is well appearing, alert and oriented x 3 today.   Head- normocephalic, atraumatic Eyes-  Sclera clear, conjunctiva pink Ears- hearing intact Oropharynx- clear Neck-  supple, no JVP Lymph- no cervical lymphadenopathy Lungs- Clear to ausculation bilaterally, normal work of breathing Heart regular rate and rhythm, no murmurs, rubs or gallops, PMI not laterally displaced GI- soft, NT, ND, + BS Extremities- no clubbing, cyanosis, or edema MS- no significant deformity or atrophy Skin- no rash or lesion Psych- euthymic mood, full affect Neuro- strength and sensation are intact  EKG-NSR at 68 bpm, pr int 180 ms, qrs int 86 ms, qtc 429 ms  Echo- 2019-Study Conclusions   - Left ventricle: The cavity size was normal. Wall thickness was  increased in a pattern of mild LVH. Systolic function was normal.  The estimated ejection fraction was in the range of 60% to 65%.  - Mitral valve: There was mild regurgitation.   Assessment and Plan: 1. New onset afib In SR today  General discussion re afib and triggers Pt most likely  trigger recently is infection  Continue  metoprolol to 50 mg bid   2. CHA2DS2VASc score of 3  Continue  eliquis 5 mg bid   F/u with Dr. Percival Spanish 12/17 afib clinic as  needed   Butch Penny C. Jehieli Brassell, [facility] [address] [place], [place] [address] [phone]

## 2020-10-13 ENCOUNTER — Telehealth: Payer: Self-pay | Admitting: Cardiology

## 2020-10-13 MED ORDER — APIXABAN 5 MG PO TABS: 5.0000 mg | ORAL_TABLET | Freq: Two times a day (BID) | ORAL | 5 refills | Status: DC

## 2020-10-13 NOTE — Telephone Encounter (Signed)
Prescription refill request for Eliquis received. Indication: Atrial Fibrillation Last office visit: 09/2020  Hochrein Scr: 0.88  09/2020 Age: 74 Weight: 69.7 kg  Prescription refilled

## 2020-10-13 NOTE — Telephone Encounter (Signed)
*  STAT* If patient is at the pharmacy, call can be transferred to refill team.   1. Which medications need to be refilled? (please list name of each medication and dose if known) apixaban (ELIQUIS) 5 MG TABS tablet  2. Which pharmacy/location (including street and city if local pharmacy) is medication to be sent to? [facility] - [place], [address]  3. Do they need a 30 day or 90 day supply? 90 day supply

## 2020-10-14 DIAGNOSIS — I480 Paroxysmal atrial fibrillation: Secondary | ICD-10-CM | POA: Diagnosis not present

## 2020-10-14 DIAGNOSIS — N390 Urinary tract infection, site not specified: Secondary | ICD-10-CM | POA: Diagnosis not present

## 2020-10-14 DIAGNOSIS — Z7901 Long term (current) use of anticoagulants: Secondary | ICD-10-CM | POA: Diagnosis not present

## 2020-10-14 DIAGNOSIS — R3121 Asymptomatic microscopic hematuria: Secondary | ICD-10-CM | POA: Diagnosis not present

## 2020-10-14 DIAGNOSIS — R350 Frequency of micturition: Secondary | ICD-10-CM | POA: Diagnosis not present

## 2020-10-14 DIAGNOSIS — I10 Essential (primary) hypertension: Secondary | ICD-10-CM | POA: Diagnosis not present

## 2020-10-15 DIAGNOSIS — I480 Paroxysmal atrial fibrillation: Secondary | ICD-10-CM | POA: Insufficient documentation

## 2020-10-15 NOTE — Progress Notes (Signed)
Cardiology Office Note   Date:  10/17/2020   ID:  Mary Potter, DOB 10-25-1946, MRN [id number]  PCP:  Velna Hatchet, MD  Cardiologist:   Minus Breeding, MD   No chief complaint on file.     History of Present Illness: Mary Potter is a 74 y.o. female who was referred by Velna Hatchet, MD for evaluation of an abnormal EKG and HTN.  She had chest pain and had a normal echo and negatie POET (Plain Old Exercise Treadmill).  She did have some mild calcium on CT.  She was seen earlier this year for PAF.    She still has microscopic urine in her bladder.  They cannot find the source.  She said her hemoglobin dropped a little bit but I do not have the most recent.  Not seeing any red blood and she is otherwise tolerating anticoagulation.  She has fibrillation and has had a couple of these documented on her mobile device.  He is her short leg and nothing sustained.  She is not had any presyncope or syncope.  She works out twice a day.  He denies chest discomfort, neck or arm discomfort.  Has had no shortness of breath.   Past Medical History:  Diagnosis Date  . 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 Hyperlipidemia   . PONV (postoperative nausea and vomiting)   . Thickened endometrium 01/03/2015  . Vaginal delivery 1976, 1985    Past Surgical History:  Procedure Laterality Date  . ANKLE FRACTURE SURGERY    . DILATION AND CURETTAGE OF UTERUS  2012   ablation   . HYSTEROSCOPY WITH D & C N/A 01/03/2015   Procedure: DILATATION AND CURETTAGE /HYSTEROSCOPY;  Surgeon: [name], MD;  Location: [facility];  Service: Gynecology;  Laterality: N/A;  . TONSILLECTOMY AND ADENOIDECTOMY       Current Outpatient Medications  Medication Sig Dispense Refill  . apixaban (ELIQUIS) 5 MG TABS tablet Take 1 tablet (5 mg total) by mouth 2 (two) times daily. 60 tablet 5  . cholecalciferol (VITAMIN D) 1000 UNITS tablet Take 1,000 Units by mouth every evening.     Marland Kitchen estradiol (VIVELLE-DOT) 0.025  MG/24HR Place 1 patch onto the skin 2 (two) times a week. Wed & Saturdays  5  . metoprolol tartrate (LOPRESSOR) 50 MG tablet Take 1 tablet (50 mg total) by mouth 2 (two) times daily. 60 tablet 3  . olmesartan (BENICAR) 40 MG tablet Take 40 mg by mouth at bedtime.    . progesterone (PROMETRIUM) 100 MG capsule Take 100 mg by mouth every other day.   5  . rosuvastatin (CRESTOR) 10 MG tablet Take 10 mg by mouth 3 (three) times a week. MWF     No current facility-administered medications for this visit.    Allergies:   Talwin [pentazocine]    ROS:  Please see the history of present illness.   Otherwise, review of systems are positive for none.   All other systems are reviewed and negative.    PHYSICAL EXAM: VS:  BP (!) 162/76   Pulse 68   Ht 5' 3.5" (1.613 m)   Wt 153 lb 6.4 oz (69.6 kg)   SpO2 98%   BMI 26.75 kg/m  , BMI Body mass index is 26.75 kg/m. GENERAL:  Well appearing NECK:  No jugular venous distention, waveform within normal limits, carotid upstroke brisk and symmetric, no bruits, no thyromegaly LUNGS:  Clear to auscultation bilaterally CHEST:  Unremarkable HEART:  PMI not displaced  or sustained,S1 and S2 within normal limits, no S3, no S4, no clicks, no rubs, no murmurs ABD:  Flat, positive bowel sounds normal in frequency in pitch, no bruits, no rebound, no guarding, no midline pulsatile mass, no hepatomegaly, no splenomegaly EXT:  2 plus pulses throughout, no edema, no cyanosis no clubbing   EKG:  EKG is  ordered today. The ekg ordered today demonstrates sinus rhythm, rate 68, left ventricular hypertrophy by voltage criteria, left axis deviation, no acute ST-T wave changes.   Recent Labs: 09/15/2020: BUN 20; Creatinine, Ser 0.88; Hemoglobin 14.2; Platelets 314; Potassium 3.9; Sodium 140 09/16/2020: TSH 1.540    Lipid Panel No results found for: CHOL, TRIG, HDL, CHOLHDL, VLDL, LDLCALC, LDLDIRECT    Wt Readings from Last 3 Encounters:  10/17/20 153 lb 6.4 oz  (69.6 kg)  10/02/20 153 lb 9.6 oz (69.7 kg)  09/18/20 151 lb 6.4 oz (68.7 kg)      Other studies Reviewed: Additional studies/ records that were reviewed today include:  None Review of the above records demonstrates:  Please see elsewhere in the note.     ASSESSMENT AND PLAN:  HTN:  Her blood pressure is elevated but she says this is greater than 761Y systolic at home and the diastolics in the 70L.  No change in therapy.   ATRIAL FIB:   Mary Potter has a CHA2DS2 - VASc score of 2.  We had a long conversation with her and she agrees that she wants to take anticoagulation.  She will continue this and we will like her blood counts.  Current medicines are reviewed at length with the patient today.  The patient does not have concerns regarding medicines.  The following changes have been made:  no change  Labs/ tests ordered today include: None  No orders of the defined types were placed in this encounter.    Disposition:   FU with 12 months  Signed, Minus Breeding, MD  10/17/2020 7:48 AM    [facility]

## 2020-10-16 DIAGNOSIS — R809 Proteinuria, unspecified: Secondary | ICD-10-CM | POA: Diagnosis not present

## 2020-10-16 DIAGNOSIS — R3121 Asymptomatic microscopic hematuria: Secondary | ICD-10-CM | POA: Diagnosis not present

## 2020-10-17 ENCOUNTER — Other Ambulatory Visit: Payer: Self-pay

## 2020-10-17 ENCOUNTER — Encounter: Payer: Self-pay | Admitting: Cardiology

## 2020-10-17 ENCOUNTER — Ambulatory Visit (INDEPENDENT_AMBULATORY_CARE_PROVIDER_SITE_OTHER): Payer: Medicare Other | Admitting: Cardiology

## 2020-10-17 VITALS — BP 162/76 | HR 68 | Ht 63.5 in | Wt 153.4 lb

## 2020-10-17 DIAGNOSIS — I480 Paroxysmal atrial fibrillation: Secondary | ICD-10-CM

## 2020-10-17 DIAGNOSIS — R931 Abnormal findings on diagnostic imaging of heart and coronary circulation: Secondary | ICD-10-CM | POA: Diagnosis not present

## 2020-10-17 DIAGNOSIS — I10 Essential (primary) hypertension: Secondary | ICD-10-CM | POA: Diagnosis not present

## 2020-10-17 NOTE — Patient Instructions (Signed)

## 2020-10-27 ENCOUNTER — Other Ambulatory Visit (HOSPITAL_COMMUNITY): Payer: Self-pay | Admitting: *Deleted

## 2020-10-27 MED ORDER — METOPROLOL TARTRATE 50 MG PO TABS: 50.0000 mg | ORAL_TABLET | Freq: Two times a day (BID) | ORAL | 1 refills | Status: DC

## 2020-11-12 DIAGNOSIS — R7309 Other abnormal glucose: Secondary | ICD-10-CM | POA: Diagnosis not present

## 2020-11-12 DIAGNOSIS — R3129 Other microscopic hematuria: Secondary | ICD-10-CM | POA: Diagnosis not present

## 2020-11-12 DIAGNOSIS — I480 Paroxysmal atrial fibrillation: Secondary | ICD-10-CM | POA: Diagnosis not present

## 2020-11-12 DIAGNOSIS — I129 Hypertensive chronic kidney disease with stage 1 through stage 4 chronic kidney disease, or unspecified chronic kidney disease: Secondary | ICD-10-CM | POA: Diagnosis not present

## 2020-11-12 DIAGNOSIS — N3001 Acute cystitis with hematuria: Secondary | ICD-10-CM | POA: Diagnosis not present

## 2020-11-12 DIAGNOSIS — R809 Proteinuria, unspecified: Secondary | ICD-10-CM | POA: Diagnosis not present

## 2020-12-29 DIAGNOSIS — R809 Proteinuria, unspecified: Secondary | ICD-10-CM | POA: Diagnosis not present

## 2021-01-08 DIAGNOSIS — I129 Hypertensive chronic kidney disease with stage 1 through stage 4 chronic kidney disease, or unspecified chronic kidney disease: Secondary | ICD-10-CM | POA: Diagnosis not present

## 2021-01-08 DIAGNOSIS — R3129 Other microscopic hematuria: Secondary | ICD-10-CM | POA: Diagnosis not present

## 2021-01-08 DIAGNOSIS — R809 Proteinuria, unspecified: Secondary | ICD-10-CM | POA: Diagnosis not present

## 2021-01-08 DIAGNOSIS — I480 Paroxysmal atrial fibrillation: Secondary | ICD-10-CM | POA: Diagnosis not present

## 2021-02-10 DIAGNOSIS — N8502 Endometrial intraepithelial neoplasia [EIN]: Secondary | ICD-10-CM | POA: Diagnosis not present

## 2021-02-10 DIAGNOSIS — N950 Postmenopausal bleeding: Secondary | ICD-10-CM | POA: Diagnosis not present

## 2021-02-10 DIAGNOSIS — Z79890 Hormone replacement therapy (postmenopausal): Secondary | ICD-10-CM | POA: Diagnosis not present

## 2021-02-17 ENCOUNTER — Encounter: Payer: Self-pay | Admitting: Gynecologic Oncology

## 2021-02-18 ENCOUNTER — Encounter: Payer: Self-pay | Admitting: Gynecologic Oncology

## 2021-02-18 ENCOUNTER — Inpatient Hospital Stay: Payer: Medicare Other | Attending: Gynecologic Oncology | Admitting: Gynecologic Oncology

## 2021-02-18 ENCOUNTER — Other Ambulatory Visit: Payer: Self-pay

## 2021-02-18 VITALS — BP 163/81 | HR 70 | Resp 16 | Ht 63.5 in | Wt 158.0 lb

## 2021-02-18 DIAGNOSIS — N8502 Endometrial intraepithelial neoplasia [EIN]: Secondary | ICD-10-CM | POA: Diagnosis not present

## 2021-02-18 DIAGNOSIS — I4891 Unspecified atrial fibrillation: Secondary | ICD-10-CM | POA: Diagnosis not present

## 2021-02-18 DIAGNOSIS — Z7901 Long term (current) use of anticoagulants: Secondary | ICD-10-CM | POA: Insufficient documentation

## 2021-02-18 DIAGNOSIS — Z79899 Other long term (current) drug therapy: Secondary | ICD-10-CM | POA: Diagnosis not present

## 2021-02-18 DIAGNOSIS — E785 Hyperlipidemia, unspecified: Secondary | ICD-10-CM | POA: Diagnosis not present

## 2021-02-18 DIAGNOSIS — I10 Essential (primary) hypertension: Secondary | ICD-10-CM | POA: Insufficient documentation

## 2021-02-18 MED ORDER — TRAMADOL HCL 50 MG PO TABS: 50.0000 mg | ORAL_TABLET | Freq: Four times a day (QID) | ORAL | 0 refills | Status: DC | PRN

## 2021-02-18 MED ORDER — SENNOSIDES-DOCUSATE SODIUM 8.6-50 MG PO TABS: 2.0000 | ORAL_TABLET | Freq: Every day | ORAL | 0 refills | Status: DC

## 2021-02-18 NOTE — H&P (View-Only) (Signed)
GYNECOLOGIC ONCOLOGY NEW PATIENT CONSULTATION   Patient Name: Mary Potter  Patient Age: 75 y.o. Date of Service: 02/18/21 Referring Provider: [name] MD  Primary Care Provider: Velna Hatchet, MD Consulting Provider: Jeral Pinch, MD   Assessment/Plan:  Postmenopausal patient with complex atypical hyperplasia.  We reviewed the diagnosis of complex atypical hyperplasia (CAH) and the treatment options, including medical management (Mirena IUD or progesterone PO) or hysterectomy.    We discussed that in the setting of somebody who is a surgical candidate and has completed childbearing, that the standard of care is definitive surgery with hysterectomy.  The patient is a suitable candidate for hysterectomy via a minimally invasive approach to surgery.  Given that she is postmenopausal, a bilateral salpingo-oophorectomy is also recommended.  We reviewed that robotic assistance would be used to complete the surgery.  We discussed that endometrial cancer is detected in about 40% of final uterine pathology specimens from patients with CAH.    I discussed that we could proceed with surgery in 2 ways.  The first would be to proceed with sentinel lymph node biopsy at the time of her hysterectomy.  This would allow for lymph node evaluation if an endometrial cancer was identified.  While this is not the standard of care, given the significant risk of detecting an endometrial cancer at the time of final pathology, I offer this to patients with CAH on biopsy.  The other option is that we could move forward with hysterectomy and BSO alone and plan to send the uterus for intraoperative frozen pathology.  If cancer is identified at the time of surgery, additional procedures including lymph node evaluation, would be based on pathology findings at the time of surgery.  This could mean that the patient would benefit from a full lymphadenectomy.  We discussed the risks associated with a full  lymphadenectomy compared to sentinel lymph node biopsy.  The patient would like some time to think about this and whether she would like to proceed with sentinel lymph node biopsy versus intraoperative path assessment to determine need for lymph node removal.  We discussed the role that exogenous estrogen plays in the development of complex atypical hyperplasia as well as endometrioid adenocarcinoma.  The patient has a long history of being on estrogen replacement therapy, much of which she was on unopposed.  She has more recently been on progesterone although may not have been taking it frequently enough to adequately protect her uterine lining.  She has stopped her estrogen replacement therapy and I have suggested that she continue her progesterone therapy until the time of surgery.  We reviewed the sentinel lymph node technique. Risks and benefits of sentinel lymph node biopsy was reviewed. We reviewed the technique and ICG dye. The patient DOES NOT have an iodine allergy or known liver dysfunction. We reviewed the false negative rate (0.4%), and that 3% of patients with metastatic disease will not have it detected by SLN biopsy in endometrial cancer. A low risk of allergic reaction to the dye, <0.2% for ICG, has been reported. We also discussed that in the case of failed mapping, which occurs 40% of the time, a bilateral or unilateral lymphadenectomy will be performed at the surgeon's discretion.   Potential benefits of sentinel nodes including a higher detection rate for metastasis due to ultrastaging and potential reduction in operative morbidity. However, there remains uncertainty as to the role for treatment of micrometastatic disease. Further, the benefit of operative morbidity associated with the SLN technique in endometrial  cancer is not yet completely known. In other patient populations (e.g. the cervical cancer population) there has been observed reductions in morbidity with SLN biopsy compared to  pelvic lymphadenectomy. Lymphedema, nerve dysfunction and lymphocysts are all potential risks with the SLN technique as with complete lymphadenectomy. Additional risks to the patient include the risk of damage to an internal organ while operating in an altered view (e.g. the black and white image of the robotic fluorescence imaging mode).   We discussed the plan for a robotic assisted hysterectomy, bilateral salpingo-oophorectomy, possible sentinel lymph node evaluation, possible lymph node dissection, possible laparotomy. The risks of surgery were discussed in detail and she understands these to include infection; wound separation; hernia; vaginal cuff separation, injury to adjacent organs such as bowel, bladder, blood vessels, ureters and nerves; bleeding which may require blood transfusion; anesthesia risk; thromboembolic events; possible death; unforeseen complications; possible need for re-exploration; medical complications such as heart attack, stroke, pleural effusion and pneumonia; and, if full lymphadenectomy is performed the risk of lymphedema and lymphocyst. The patient will receive DVT and antibiotic prophylaxis as indicated. She voiced a clear understanding. She had the opportunity to ask questions. Perioperative instructions were reviewed with her. Prescriptions for post-op medications were sent to her pharmacy of choice.  In terms of her cardiac history, we will send surgical clearance to her cardiologist.  I anticipate that she will not need an anticoagulation bridge and that we can have her stop her Eliquis 48-72 hours before surgery and restart it approximately the same amount of time after.  A copy of this note was sent to the patient's referring provider.   65 minutes of total time was spent for this patient encounter, including preparation, face-to-face counseling with the patient and coordination of care, and documentation of the encounter.  Jeral Pinch, MD  Division of  Gynecologic Oncology  Department of Obstetrics and Gynecology  [facility]  ___________________________________________  Chief Complaint: Chief Complaint  Patient presents with  . Complex endometrial hyperplasia with atypia    History of Present Illness:  Mary Potter is a 75 y.o. y.o. female who is seen in consultation at the request of Dr. Benjie Karvonen for an evaluation of complex atypical hyperplasia.  Patient reports being on unopposed estrogen replacement therapy since her mid 79s when she went through menopause.  Until about 6 years ago, she was on unopposed estrogen.  Since that time, she has been taking Prometrium 3 days a week.  She underwent an endometrial biopsy in 2016 showing benign endometrial polyp and adjacent benign weakly proliferative endometrium without atypia, hyperplasia, or malignancy.  A couple of weeks ago, she began having spotting.  She had 3 days of spotting when she wiped after voiding.  Prior to that, she had had some light vaginal spotting several weeks before then.  She saw her gynecologist and on 4/18 underwent endometrial biopsy.  Since then, she had spotting for 2 or 3 days and has had no bleeding since.  She notes occasional cramping with her episodes of spotting.  Otherwise she denies associated symptoms.  She notes a history of intermittent vaginal bleeding since menopause.  She most recently has been using 2 estrogen patches a week and Prometrium every other day.  Her recent biopsies showed complex atypical hyperplasia.  Since these results were given to her, she has stopped her estrogen altogether and continues on progesterone.  She had a recent ultrasound exam performed at [facility] on 4/12 which noted a thickened  endometrium with slight vascularity at the fundus.  The lining measured 13.5 mm.    She reports a good appetite without nausea or emesis.  She reports normal bowel and bladder function.    In terms of her medical  history, she is overall very healthy.  She was diagnosed with atrial fibrillation after kidney stone in November.  She is now on Eliquis and metoprolol.  Since January, she is had no symptoms.  She denies any shortness of breath or chest pain at rest or with ambulation.    The patient lives in [place] by herself.  She has been in a relationship for 8 years.  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  PAST MEDICAL HISTORY:  Past Medical History:  Diagnosis Date  . Atrial fibrillation ([place])   . 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 Hyperlipidemia   . Hypertension   . PONV (postoperative nausea and vomiting)   . Thickened endometrium 01/03/2015  . Vaginal delivery 1976, 1985     PAST SURGICAL HISTORY:  Past Surgical History:  Procedure Laterality Date  . ANKLE FRACTURE SURGERY    . CATARACT EXTRACTION Right 2020  . DILATION AND CURETTAGE OF UTERUS  2012   ablation   . EYE SURGERY     Corneal growth removal on right eye  . HYSTEROSCOPY WITH D & C N/A 01/03/2015   Procedure: DILATATION AND CURETTAGE /HYSTEROSCOPY;  Surgeon: [name], MD;  Location: [facility];  Service: Gynecology;  Laterality: N/A;  . LAPAROSCOPY     for endometriosis  . TONSILLECTOMY AND ADENOIDECTOMY      OB/GYN HISTORY:  OB History  Gravida Para Term Preterm AB Living  2 2          SAB IAB Ectopic Multiple Live Births               # Outcome Date GA Lbr Len/2nd Weight Sex Delivery Anes PTL Lv  2 Para           1 Para             No LMP recorded. Patient is postmenopausal.  Age at menarche: 8 Age at menopause: 89 Hx of HRT: Yes, since her 22s, she was on unopposed estrogen until about 6 years ago.  Since then, she has been on combined HRT. Hx of STDs: Denies Last pap: 2013 History of abnormal pap smears: Denies  SCREENING STUDIES:  Last mammogram: 2021  Last colonoscopy: 2012  MEDICATIONS: Outpatient Encounter Medications as of 02/18/2021  Medication Sig  .  apixaban (ELIQUIS) 5 MG TABS tablet Eliquis 5 mg tablet  TAKE 1 TABLET BY MOUTH TWICE A DAY  . cholecalciferol (VITAMIN D) 1000 UNITS tablet Take 1,000 Units by mouth every evening.   Marland Kitchen estradiol (VIVELLE-DOT) 0.025 MG/24HR Place 1 patch onto the skin 2 (two) times a week. Wed & Saturdays  . metoprolol tartrate (LOPRESSOR) 50 MG tablet Take 50 mg by mouth 2 (two) times daily.  Marland Kitchen olmesartan (BENICAR) 40 MG tablet Take 40 mg by mouth at bedtime.  . progesterone (PROMETRIUM) 100 MG capsule Take 100 mg by mouth daily.  . rosuvastatin (CRESTOR) 10 MG tablet Take 10 mg by mouth 3 (three) times a week. MWF  . senna-docusate (SENOKOT-S) 8.6-50 MG tablet Take 2 tablets by mouth at bedtime. For AFTER surgery, do not take if having diarrhea  . traMADol (ULTRAM) 50 MG tablet Take 1 tablet (50 mg total) by mouth every 6 (six)  hours as needed for severe pain. For AFTER surgery only, do not take and drive  . [DISCONTINUED] apixaban (ELIQUIS) 5 MG TABS tablet Take 1 tablet (5 mg total) by mouth 2 (two) times daily.  . [DISCONTINUED] metoprolol tartrate (LOPRESSOR) 50 MG tablet Take 1 tablet (50 mg total) by mouth 2 (two) times daily.   No facility-administered encounter medications on file as of 02/18/2021.    ALLERGIES:  Allergies  Allergen Reactions  . Talwin [Pentazocine] Anaphylaxis     FAMILY HISTORY:  Family History  Problem Relation Age of Onset  . Heart disease Mother 19       CABG  . Stroke Father   . Prostate cancer Father   . Heart disease Brother 48       Transplant, died 57 years  . Heart disease Maternal Grandmother   . Heart disease Paternal Grandmother   . Cancer Paternal Grandfather   . Heart disease Brother 12       RF  . Leukemia Brother   . Leukemia Brother   . Hypertension Sister   . Cervical cancer Sister   . Colon cancer Neg Hx   . Breast cancer Neg Hx   . Ovarian cancer Neg Hx   . Endometrial cancer Neg Hx   . Pancreatic cancer Neg Hx      SOCIAL HISTORY:   Social Connections: Not on file    REVIEW OF SYSTEMS:  + Vaginal bleeding, joint pain, muscle pain and cramps Denies appetite changes, fevers, chills, fatigue, unexplained weight changes. Denies hearing loss, neck lumps or masses, mouth sores, ringing in ears or voice changes. Denies cough or wheezing.  Denies shortness of breath. Denies chest pain or palpitations. Denies leg swelling. Denies abdominal distention, pain, blood in stools, constipation, diarrhea, nausea, vomiting, or early satiety. Denies pain with intercourse, dysuria, frequency, hematuria or incontinence. Denies hot flashes, pelvic pain or vaginal discharge.   Denies itching, rash, or wounds. Denies dizziness, headaches, numbness or seizures. Denies swollen lymph nodes or glands, denies easy bruising or bleeding. Denies anxiety, depression, confusion, or decreased concentration.  Physical Exam:  Vital Signs for this encounter:  Blood pressure (!) 163/81, pulse 70, resp. rate 16, height 5' 3.5" (1.613 m), weight 158 lb (71.7 kg), SpO2 99 %. Body mass index is 27.55 kg/m. General: Alert, oriented, no acute distress.  HEENT: Normocephalic, atraumatic. Sclera anicteric.  Chest: Clear to auscultation bilaterally. No wheezes, rhonchi, or rales. Cardiovascular: Regular rate and rhythm, no murmurs, rubs, or gallops.  Abdomen: Normoactive bowel sounds. Soft, nondistended, nontender to palpation. No masses or hepatosplenomegaly appreciated. No palpable fluid wave.  Extremities: Grossly normal range of motion. Warm, well perfused. No edema bilaterally.  Skin: No rashes or lesions.  Lymphatics: No cervical, supraclavicular, or inguinal adenopathy.  GU:  Normal external female genitalia. No lesions. No discharge or bleeding.             Bladder/urethra:  No lesions or masses, well supported bladder             Vagina: Well rugated, no lesions or masses.             Cervix: Normal appearing, no lesions.             Uterus:  Small, mobile, no parametrial involvement or nodularity.             Adnexa: No masses appreciated.  LABORATORY AND RADIOLOGIC DATA:  Outside medical records were reviewed to synthesize the above history, along with  the history and physical obtained during the visit.   Lab Results  Component Value Date   WBC 8.3 09/15/2020   HGB 14.2 09/15/2020   HCT 44.7 09/15/2020   PLT 314 09/15/2020   GLUCOSE 154 (H) 09/15/2020   ALT 22 03/28/2018   AST 20 03/28/2018   NA 140 09/15/2020   K 3.9 09/15/2020   CL 104 09/15/2020   CREATININE 0.88 09/15/2020   BUN 20 09/15/2020   CO2 26 09/15/2020   TSH 1.540 09/16/2020    CT A/P 09/2020: IMPRESSION: Cholelithiasis without complicating factors.  Diverticulosis with very minimal diverticulitis in the sigmoid. No abscess or perforation is noted.  No other focal abnormality is noted.

## 2021-02-18 NOTE — Patient Instructions (Addendum)
Preparing for your Surgery  Plan for surgery on Mar 17, 2021 with Dr. Jeral Pinch at [place] will be scheduled for a robotic assisted total laparoscopic hysterectomy (removal of the uterus and cervix), bilateral salpingo-oophorectomy (removal of both ovaries and fallopian tubes), possible sentinel lymph node biopsy, possible lymph node dissection, possible laparotomy (larger incision on your abdomen if needed).   Pre-operative Testing -You will receive a phone call from presurgical testing at [facility] to arrange for a pre-operative appointment, labs, and COVID test. The COVID test normally happens 3 days prior to the surgery and they ask that you self quarantine after the test up until surgery to decrease chance of exposure.  -Bring your insurance card, copy of an advanced directive if applicable, medication list  -At that visit, you will be asked to sign a consent for a possible blood transfusion in case a transfusion becomes necessary during surgery.  The need for a blood transfusion is rare but having consent is a necessary part of your care.     -WE WILL REACH OUT TO YOUR CARDIOLOGIST TO GET CARDIAC CLEARANCE PRIOR TO YOUR SURGERY. WE WILL ALSO GET RECOMMENDATIONS FOR YOUR ELIQUIS AND CONTACT YOU WITH THE INFORMATION ABOUT WHEN TO STOP TAKING AND WHEN TO RESUME.  -Do not take supplements such as fish oil (omega 3), red yeast rice, turmeric before your surgery. You want to avoid medications with aspirin in them including headache powders such as BC or Goody's), Excedrin migraine.  Day Before Surgery at [place] will be asked to take in a light diet the day before surgery. You will be advised you can have clear liquids up until 3 hours before your surgery.    Eat a light diet the day before surgery.  Examples including soups, broths, toast, yogurt, mashed potatoes.  AVOID GAS PRODUCING FOODS. Things to avoid include carbonated beverages (fizzy beverages,  sodas), raw fruits and raw vegetables (uncooked), or beans.   If your bowels are filled with gas, your surgeon will have difficulty visualizing your pelvic organs which increases your surgical risks.  Your role in recovery Your role is to become active as soon as directed by your doctor, while still giving yourself time to heal.  Rest when you feel tired. You will be asked to do the following in order to speed your recovery:  - Cough and breathe deeply. This helps to clear and expand your lungs and can prevent pneumonia after surgery.  - [place]. Do mild physical activity. Walking or moving your legs help your circulation and body functions return to normal. Do not try to get up or walk alone the first time after surgery.   -If you develop swelling on one leg or the other, pain in the back of your leg, redness/warmth in one of your legs, please call the office or go to the Emergency Room to have a doppler to rule out a blood clot. For shortness of breath, chest pain-seek care in the Emergency Room as soon as possible. - Actively manage your pain. Managing your pain lets you move in comfort. We will ask you to rate your pain on a scale of zero to 10. It is your responsibility to tell your doctor or nurse where and how much you hurt so your pain can be treated.  Special Considerations -If you are diabetic, you may be placed on insulin after surgery to have closer control over your blood sugars to promote healing  and recovery.  This does not mean that you will be discharged on insulin.  If applicable, your oral antidiabetics will be resumed when you are tolerating a solid diet.  -Your final pathology results from surgery should be available around one week after surgery and the results will be relayed to you when available.  -Dr. Lahoma Crocker is the surgeon that assists your GYN Oncologist with surgery.  If you end up staying the night, the next day after your surgery you  will either see Dr. Denman George, Dr. Berline Lopes, or Dr. Lahoma Crocker.  -FMLA forms can be faxed to [phone] and please allow 5-7 business days for completion.  Pain Management After Surgery -You have been prescribed your pain medication and bowel regimen medications before surgery so that you can have these available when you are discharged from the hospital. The pain medication is for use ONLY AFTER surgery and a new prescription will not be given.   -Make sure that you have Tylenol at home to use on a regular basis after surgery for pain control.   -Review the attached handout on narcotic use and their risks and side effects.   Bowel Regimen -You have been prescribed Sennakot-S to take nightly to prevent constipation especially if you are taking the narcotic pain medication intermittently.  It is important to prevent constipation and drink adequate amounts of liquids. You can stop taking this medication when you are not taking pain medication and you are back on your normal bowel routine.  Risks of Surgery Risks of surgery are low but include bleeding, infection, damage to surrounding structures, re-operation, blood clots, and very rarely death.   Blood Transfusion Information (For the consent to be signed before surgery)  We will be checking your blood type before surgery so in case of emergencies, we will know what type of blood you would need.                                            WHAT IS A BLOOD TRANSFUSION?  A transfusion is the replacement of blood or some of its parts. Blood is made up of multiple cells which provide different functions.  Red blood cells carry oxygen and are used for blood loss replacement.  White blood cells fight against infection.  Platelets control bleeding.  Plasma helps clot blood.  Other blood products are available for specialized needs, such as hemophilia or other clotting disorders. BEFORE THE TRANSFUSION  Who gives blood for transfusions?    You may be able to donate blood to be used at a later date on yourself (autologous donation).  Relatives can be asked to donate blood. This is generally not any safer than if you have received blood from a stranger. The same precautions are taken to ensure safety when a relative's blood is donated.  Healthy volunteers who are fully evaluated to make sure their blood is safe. This is blood bank blood. Transfusion therapy is the safest it has ever been in the practice of medicine. Before blood is taken from a donor, a complete history is taken to make sure that person has no history of diseases nor engages in risky social behavior (examples are intravenous drug use or sexual activity with multiple partners). The donor's travel history is screened to minimize risk of transmitting infections, such as malaria. The donated blood is tested for signs of infectious diseases,  such as HIV and hepatitis. The blood is then tested to be sure it is compatible with you in order to minimize the chance of a transfusion reaction. If you or a relative donates blood, this is often done in anticipation of surgery and is not appropriate for emergency situations. It takes many days to process the donated blood. RISKS AND COMPLICATIONS Although transfusion therapy is very safe and saves many lives, the main dangers of transfusion include:   Getting an infectious disease.  Developing a transfusion reaction. This is an allergic reaction to something in the blood you were given. Every precaution is taken to prevent this. The decision to have a blood transfusion has been considered carefully by your caregiver before blood is given. Blood is not given unless the benefits outweigh the risks.  AFTER SURGERY INSTRUCTIONS  Return to work: 4-6 weeks if applicable  Activity: 1. Be up and out of the bed during the day.  Take a nap if needed.  You may walk up steps but be careful and use the hand rail.  Stair climbing will tire  you more than you think, you may need to stop part way and rest.   2. No lifting or straining for 6 weeks over 10 pounds. No pushing, pulling, straining for 6 weeks.  3. No driving for 1 week(s) if you were cleared to drive before surgery.  Do not drive if you are taking narcotic pain medicine and make sure that your reaction time has returned.   4. You can shower as soon as the next day after surgery. Shower daily.  Use your regular soap and water (not directly on the incision) and pat your incision(s) dry afterwards; don't rub.  No tub baths or submerging your body in water until cleared by your surgeon. If you have the soap that was given to you by pre-surgical testing that was used before surgery, you do not need to use it afterwards because this can irritate your incisions.   5. No sexual activity and nothing in the vagina for 8 weeks.  6. You may experience a small amount of clear drainage from your incisions, which is normal.  If the drainage persists, increases, or changes color please call the office.  7. Do not use creams, lotions, or ointments such as neosporin on your incisions after surgery until advised by your surgeon because they can cause removal of the dermabond glue on your incisions.    8. You may experience vaginal spotting after surgery or around the 6-8 week mark from surgery when the stitches at the top of the vagina begin to dissolve.  The spotting is normal but if you experience heavy bleeding, call our office.  9. Take Tylenol first for pain and only use narcotic pain medication for severe pain not relieved by the Tylenol.  Monitor your Tylenol intake to a max of 4,000 mg in a 24 hour period. You can alternate these medications after surgery.  Diet: 1. Low sodium Heart Healthy Diet is recommended but you are cleared to resume your normal (before surgery) diet after your procedure.  2. It is safe to use a laxative, such as Miralax or Colace, if you have difficulty  moving your bowels. You have been prescribed Sennakot at bedtime every evening to keep bowel movements regular and to prevent constipation.    Wound Care: 1. Keep clean and dry.  Shower daily.  Reasons to call the Doctor:  Fever - Oral temperature greater than 100.4 degrees Fahrenheit  Foul-smelling vaginal discharge  Difficulty urinating  Nausea and vomiting  Increased pain at the site of the incision that is unrelieved with pain medicine.  Difficulty breathing with or without chest pain  New calf pain especially if only on one side  Sudden, continuing increased vaginal bleeding with or without clots.   Contacts: For questions or concerns you should contact:  Dr. Jeral Pinch at [phone]  [name], NP at [phone]  After Hours: call [phone] and have the GYN Oncologist paged/contacted (after 5 pm or on the weekends).  Messages sent via mychart are for non-urgent matters and are not responded to after hours so for urgent needs, please call the after hours number.

## 2021-02-18 NOTE — Progress Notes (Signed)
GYNECOLOGIC ONCOLOGY NEW PATIENT CONSULTATION   Patient Name: Mary Potter  Patient Age: 75 y.o. Date of Service: 02/18/21 Referring Provider: [name] MD  Primary Care Provider: Velna Hatchet, MD Consulting Provider: Jeral Pinch, MD   Assessment/Plan:  Postmenopausal patient with complex atypical hyperplasia.  We reviewed the diagnosis of complex atypical hyperplasia (CAH) and the treatment options, including medical management (Mirena IUD or progesterone PO) or hysterectomy.    We discussed that in the setting of somebody who is a surgical candidate and has completed childbearing, that the standard of care is definitive surgery with hysterectomy.  The patient is a suitable candidate for hysterectomy via a minimally invasive approach to surgery.  Given that she is postmenopausal, a bilateral salpingo-oophorectomy is also recommended.  We reviewed that robotic assistance would be used to complete the surgery.  We discussed that endometrial cancer is detected in about 40% of final uterine pathology specimens from patients with CAH.    I discussed that we could proceed with surgery in 2 ways.  The first would be to proceed with sentinel lymph node biopsy at the time of her hysterectomy.  This would allow for lymph node evaluation if an endometrial cancer was identified.  While this is not the standard of care, given the significant risk of detecting an endometrial cancer at the time of final pathology, I offer this to patients with CAH on biopsy.  The other option is that we could move forward with hysterectomy and BSO alone and plan to send the uterus for intraoperative frozen pathology.  If cancer is identified at the time of surgery, additional procedures including lymph node evaluation, would be based on pathology findings at the time of surgery.  This could mean that the patient would benefit from a full lymphadenectomy.  We discussed the risks associated with a full  lymphadenectomy compared to sentinel lymph node biopsy.  The patient would like some time to think about this and whether she would like to proceed with sentinel lymph node biopsy versus intraoperative path assessment to determine need for lymph node removal.  We discussed the role that exogenous estrogen plays in the development of complex atypical hyperplasia as well as endometrioid adenocarcinoma.  The patient has a long history of being on estrogen replacement therapy, much of which she was on unopposed.  She has more recently been on progesterone although may not have been taking it frequently enough to adequately protect her uterine lining.  She has stopped her estrogen replacement therapy and I have suggested that she continue her progesterone therapy until the time of surgery.  We reviewed the sentinel lymph node technique. Risks and benefits of sentinel lymph node biopsy was reviewed. We reviewed the technique and ICG dye. The patient DOES NOT have an iodine allergy or known liver dysfunction. We reviewed the false negative rate (0.4%), and that 3% of patients with metastatic disease will not have it detected by SLN biopsy in endometrial cancer. A low risk of allergic reaction to the dye, <0.2% for ICG, has been reported. We also discussed that in the case of failed mapping, which occurs 40% of the time, a bilateral or unilateral lymphadenectomy will be performed at the surgeon's discretion.   Potential benefits of sentinel nodes including a higher detection rate for metastasis due to ultrastaging and potential reduction in operative morbidity. However, there remains uncertainty as to the role for treatment of micrometastatic disease. Further, the benefit of operative morbidity associated with the SLN technique in endometrial  cancer is not yet completely known. In other patient populations (e.g. the cervical cancer population) there has been observed reductions in morbidity with SLN biopsy compared to  pelvic lymphadenectomy. Lymphedema, nerve dysfunction and lymphocysts are all potential risks with the SLN technique as with complete lymphadenectomy. Additional risks to the patient include the risk of damage to an internal organ while operating in an altered view (e.g. the black and white image of the robotic fluorescence imaging mode).   We discussed the plan for a robotic assisted hysterectomy, bilateral salpingo-oophorectomy, possible sentinel lymph node evaluation, possible lymph node dissection, possible laparotomy. The risks of surgery were discussed in detail and she understands these to include infection; wound separation; hernia; vaginal cuff separation, injury to adjacent organs such as bowel, bladder, blood vessels, ureters and nerves; bleeding which may require blood transfusion; anesthesia risk; thromboembolic events; possible death; unforeseen complications; possible need for re-exploration; medical complications such as heart attack, stroke, pleural effusion and pneumonia; and, if full lymphadenectomy is performed the risk of lymphedema and lymphocyst. The patient will receive DVT and antibiotic prophylaxis as indicated. She voiced a clear understanding. She had the opportunity to ask questions. Perioperative instructions were reviewed with her. Prescriptions for post-op medications were sent to her pharmacy of choice.  In terms of her cardiac history, we will send surgical clearance to her cardiologist.  I anticipate that she will not need an anticoagulation bridge and that we can have her stop her Eliquis 48-72 hours before surgery and restart it approximately the same amount of time after.  A copy of this note was sent to the patient's referring provider.   65 minutes of total time was spent for this patient encounter, including preparation, face-to-face counseling with the patient and coordination of care, and documentation of the encounter.  Jeral Pinch, MD  Division of  Gynecologic Oncology  Department of Obstetrics and Gynecology  [facility]  ___________________________________________  Chief Complaint: Chief Complaint  Patient presents with  . Complex endometrial hyperplasia with atypia    History of Present Illness:  Mary Potter is a 75 y.o. y.o. female who is seen in consultation at the request of Dr. Benjie Karvonen for an evaluation of complex atypical hyperplasia.  Patient reports being on unopposed estrogen replacement therapy since her mid 63s when she went through menopause.  Until about 6 years ago, she was on unopposed estrogen.  Since that time, she has been taking Prometrium 3 days a week.  She underwent an endometrial biopsy in 2016 showing benign endometrial polyp and adjacent benign weakly proliferative endometrium without atypia, hyperplasia, or malignancy.  A couple of weeks ago, she began having spotting.  She had 3 days of spotting when she wiped after voiding.  Prior to that, she had had some light vaginal spotting several weeks before then.  She saw her gynecologist and on 4/18 underwent endometrial biopsy.  Since then, she had spotting for 2 or 3 days and has had no bleeding since.  She notes occasional cramping with her episodes of spotting.  Otherwise she denies associated symptoms.  She notes a history of intermittent vaginal bleeding since menopause.  She most recently has been using 2 estrogen patches a week and Prometrium every other day.  Her recent biopsies showed complex atypical hyperplasia.  Since these results were given to her, she has stopped her estrogen altogether and continues on progesterone.  She had a recent ultrasound exam performed at [facility] on 4/12 which noted a thickened  endometrium with slight vascularity at the fundus.  The lining measured 13.5 mm.    She reports a good appetite without nausea or emesis.  She reports normal bowel and bladder function.    In terms of her medical  history, she is overall very healthy.  She was diagnosed with atrial fibrillation after kidney stone in November.  She is now on Eliquis and metoprolol.  Since January, she is had no symptoms.  She denies any shortness of breath or chest pain at rest or with ambulation.    The patient lives in [place] by herself.  She has been in a relationship for 8 years.  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  PAST MEDICAL HISTORY:  Past Medical History:  Diagnosis Date  . Atrial fibrillation ([place])   . 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 Hyperlipidemia   . Hypertension   . PONV (postoperative nausea and vomiting)   . Thickened endometrium 01/03/2015  . Vaginal delivery 1976, 1985     PAST SURGICAL HISTORY:  Past Surgical History:  Procedure Laterality Date  . ANKLE FRACTURE SURGERY    . CATARACT EXTRACTION Right 2020  . DILATION AND CURETTAGE OF UTERUS  2012   ablation   . EYE SURGERY     Corneal growth removal on right eye  . HYSTEROSCOPY WITH D & C N/A 01/03/2015   Procedure: DILATATION AND CURETTAGE /HYSTEROSCOPY;  Surgeon: [name], MD;  Location: [facility];  Service: Gynecology;  Laterality: N/A;  . LAPAROSCOPY     for endometriosis  . TONSILLECTOMY AND ADENOIDECTOMY      OB/GYN HISTORY:  OB History  Gravida Para Term Preterm AB Living  2 2          SAB IAB Ectopic Multiple Live Births               # Outcome Date GA Lbr Len/2nd Weight Sex Delivery Anes PTL Lv  2 Para           1 Para             No LMP recorded. Patient is postmenopausal.  Age at menarche: 33 Age at menopause: 22 Hx of HRT: Yes, since her 18s, she was on unopposed estrogen until about 6 years ago.  Since then, she has been on combined HRT. Hx of STDs: Denies Last pap: 2013 History of abnormal pap smears: Denies  SCREENING STUDIES:  Last mammogram: 2021  Last colonoscopy: 2012  MEDICATIONS: Outpatient Encounter Medications as of 02/18/2021  Medication Sig  .  apixaban (ELIQUIS) 5 MG TABS tablet Eliquis 5 mg tablet  TAKE 1 TABLET BY MOUTH TWICE A DAY  . cholecalciferol (VITAMIN D) 1000 UNITS tablet Take 1,000 Units by mouth every evening.   Marland Kitchen estradiol (VIVELLE-DOT) 0.025 MG/24HR Place 1 patch onto the skin 2 (two) times a week. Wed & Saturdays  . metoprolol tartrate (LOPRESSOR) 50 MG tablet Take 50 mg by mouth 2 (two) times daily.  Marland Kitchen olmesartan (BENICAR) 40 MG tablet Take 40 mg by mouth at bedtime.  . progesterone (PROMETRIUM) 100 MG capsule Take 100 mg by mouth daily.  . rosuvastatin (CRESTOR) 10 MG tablet Take 10 mg by mouth 3 (three) times a week. MWF  . senna-docusate (SENOKOT-S) 8.6-50 MG tablet Take 2 tablets by mouth at bedtime. For AFTER surgery, do not take if having diarrhea  . traMADol (ULTRAM) 50 MG tablet Take 1 tablet (50 mg total) by mouth every 6 (six)  hours as needed for severe pain. For AFTER surgery only, do not take and drive  . [DISCONTINUED] apixaban (ELIQUIS) 5 MG TABS tablet Take 1 tablet (5 mg total) by mouth 2 (two) times daily.  . [DISCONTINUED] metoprolol tartrate (LOPRESSOR) 50 MG tablet Take 1 tablet (50 mg total) by mouth 2 (two) times daily.   No facility-administered encounter medications on file as of 02/18/2021.    ALLERGIES:  Allergies  Allergen Reactions  . Talwin [Pentazocine] Anaphylaxis     FAMILY HISTORY:  Family History  Problem Relation Age of Onset  . Heart disease Mother 55       CABG  . Stroke Father   . Prostate cancer Father   . Heart disease Brother 31       Transplant, died 86 years  . Heart disease Maternal Grandmother   . Heart disease Paternal Grandmother   . Cancer Paternal Grandfather   . Heart disease Brother 12       RF  . Leukemia Brother   . Leukemia Brother   . Hypertension Sister   . Cervical cancer Sister   . Colon cancer Neg Hx   . Breast cancer Neg Hx   . Ovarian cancer Neg Hx   . Endometrial cancer Neg Hx   . Pancreatic cancer Neg Hx      SOCIAL HISTORY:   Social Connections: Not on file    REVIEW OF SYSTEMS:  + Vaginal bleeding, joint pain, muscle pain and cramps Denies appetite changes, fevers, chills, fatigue, unexplained weight changes. Denies hearing loss, neck lumps or masses, mouth sores, ringing in ears or voice changes. Denies cough or wheezing.  Denies shortness of breath. Denies chest pain or palpitations. Denies leg swelling. Denies abdominal distention, pain, blood in stools, constipation, diarrhea, nausea, vomiting, or early satiety. Denies pain with intercourse, dysuria, frequency, hematuria or incontinence. Denies hot flashes, pelvic pain or vaginal discharge.   Denies itching, rash, or wounds. Denies dizziness, headaches, numbness or seizures. Denies swollen lymph nodes or glands, denies easy bruising or bleeding. Denies anxiety, depression, confusion, or decreased concentration.  Physical Exam:  Vital Signs for this encounter:  Blood pressure (!) 163/81, pulse 70, resp. rate 16, height 5' 3.5" (1.613 m), weight 158 lb (71.7 kg), SpO2 99 %. Body mass index is 27.55 kg/m. General: Alert, oriented, no acute distress.  HEENT: Normocephalic, atraumatic. Sclera anicteric.  Chest: Clear to auscultation bilaterally. No wheezes, rhonchi, or rales. Cardiovascular: Regular rate and rhythm, no murmurs, rubs, or gallops.  Abdomen: Normoactive bowel sounds. Soft, nondistended, nontender to palpation. No masses or hepatosplenomegaly appreciated. No palpable fluid wave.  Extremities: Grossly normal range of motion. Warm, well perfused. No edema bilaterally.  Skin: No rashes or lesions.  Lymphatics: No cervical, supraclavicular, or inguinal adenopathy.  GU:  Normal external female genitalia. No lesions. No discharge or bleeding.             Bladder/urethra:  No lesions or masses, well supported bladder             Vagina: Well rugated, no lesions or masses.             Cervix: Normal appearing, no lesions.             Uterus:  Small, mobile, no parametrial involvement or nodularity.             Adnexa: No masses appreciated.  LABORATORY AND RADIOLOGIC DATA:  Outside medical records were reviewed to synthesize the above history, along with  the history and physical obtained during the visit.   Lab Results  Component Value Date   WBC 8.3 09/15/2020   HGB 14.2 09/15/2020   HCT 44.7 09/15/2020   PLT 314 09/15/2020   GLUCOSE 154 (H) 09/15/2020   ALT 22 03/28/2018   AST 20 03/28/2018   NA 140 09/15/2020   K 3.9 09/15/2020   CL 104 09/15/2020   CREATININE 0.88 09/15/2020   BUN 20 09/15/2020   CO2 26 09/15/2020   TSH 1.540 09/16/2020    CT A/P 09/2020: IMPRESSION: Cholelithiasis without complicating factors.  Diverticulosis with very minimal diverticulitis in the sigmoid. No abscess or perforation is noted.  No other focal abnormality is noted.

## 2021-02-19 ENCOUNTER — Telehealth: Payer: Self-pay | Admitting: *Deleted

## 2021-02-19 NOTE — Telephone Encounter (Signed)
Per Dr Berline Lopes fax office note and surgical stratification/optimization form; and the form for Eliquis recommendations

## 2021-02-20 ENCOUNTER — Telehealth: Payer: Self-pay

## 2021-02-20 NOTE — Telephone Encounter (Signed)
   [facility] Pre-operative Risk Assessment    Patient Name: Mary Potter  DOB: Sep 06, 1946  MRN: [id number]   [place]: - Please ensure there is not already an duplicate clearance open for this procedure. - Under Visit Info/Reason for Call, type in Other and utilize the format Clearance MM/DD/YY or Clearance TBD. Do not use dashes or single digits. - If request is for dental extraction, please clarify the # of teeth to be extracted.  Request for surgical clearance:  1. What type of surgery is being performed? Robertic assisted hysterectomy, bilateral salpingo-oophorectomy, possible sentinel lymph node evaluation, possible lymph node dissection, possible laparotomy  2. When is this surgery scheduled? 03/17/21  3. What type of clearance is required (medical clearance vs. Pharmacy clearance to hold med vs. Both)? Both   4. Are there any medications that need to be held prior to surgery and how long? Eliquis, asking for recommends for Eliquis    5. Practice name and name of physician performing surgery? [facility], Dr. Belenda Cruise tucker   6. What is the office phone number? [phone]   7.   What is the office fax number? [phone]  8.   Anesthesia type (None, local, MAC, general) ? General   Jacqulynn Cadet 02/20/2021, 11:06 AM  _________________________________________________________________   (provider comments below)

## 2021-02-23 NOTE — Telephone Encounter (Signed)
   Name: Mary Potter  DOB: 02/01/1946  MRN: [id number]   Primary Cardiologist: Minus Breeding, MD  Chart reviewed as part of pre-operative protocol coverage. Patient was contacted 02/23/2021 in reference to pre-operative risk assessment for pending surgery as outlined below.  Mary Potter was last seen on 10/17/2020 by Dr. Percival Spanish.  Since that day, Mary Potter has done well without exertional chest discomfort or worsening dyspnea.  She is able to accomplish more than 4 METS of activity without any issue.  Therefore, based on ACC/AHA guidelines, the patient would be at acceptable risk for the planned procedure without further cardiovascular testing.   Per our clinical pharmacist, patient will need to hold Eliquis for 3 days prior to the procedure and restart as soon as possible afterward at the surgeon's discretion.  The patient was advised that if she develops new symptoms prior to surgery to contact our office to arrange for a follow-up visit, and she verbalized understanding.  I will route this recommendation to the requesting party via Epic fax function and remove from pre-op pool. Please call with questions.  [place], [place] 02/23/2021, 2:20 PM

## 2021-02-23 NOTE — Telephone Encounter (Signed)
Patient with diagnosis of ATRIAL FIBRILLATION on EL;IQUIS for anticoagulation.    Procedure: HYSTERECTOMY Date of procedure: 03/17/2021   CHA2DS2-VASc Score = 3  This indicates a 3.2% annual risk of stroke. The patient's score is based upon: CHF History: No HTN History: Yes Diabetes History: No Stroke History: No Vascular Disease History: No Age Score: 1 Gender Score: 1    CrCl = 63 ml/min Platelet count = 323  Per office protocol, patient can hold ELIQUISCfor 3 days prior to procedure.   Patient WILL NOT need bridging with Lovenox (enoxaparin) around procedure.  Patient should restart Eliquis  at discretion of procedure MD

## 2021-02-24 ENCOUNTER — Telehealth: Payer: Self-pay | Admitting: *Deleted

## 2021-02-24 DIAGNOSIS — D2239 Melanocytic nevi of other parts of face: Secondary | ICD-10-CM | POA: Diagnosis not present

## 2021-02-24 DIAGNOSIS — L821 Other seborrheic keratosis: Secondary | ICD-10-CM | POA: Diagnosis not present

## 2021-02-24 DIAGNOSIS — D2272 Melanocytic nevi of left lower limb, including hip: Secondary | ICD-10-CM | POA: Diagnosis not present

## 2021-02-24 DIAGNOSIS — Z85828 Personal history of other malignant neoplasm of skin: Secondary | ICD-10-CM | POA: Diagnosis not present

## 2021-02-24 DIAGNOSIS — D225 Melanocytic nevi of trunk: Secondary | ICD-10-CM | POA: Diagnosis not present

## 2021-02-24 DIAGNOSIS — L814 Other melanin hyperpigmentation: Secondary | ICD-10-CM | POA: Diagnosis not present

## 2021-02-24 DIAGNOSIS — L570 Actinic keratosis: Secondary | ICD-10-CM | POA: Diagnosis not present

## 2021-02-24 DIAGNOSIS — D485 Neoplasm of uncertain behavior of skin: Secondary | ICD-10-CM | POA: Diagnosis not present

## 2021-02-24 NOTE — Telephone Encounter (Signed)
Received cardiology stratification/optimization form and fax to pre admission

## 2021-03-05 NOTE — Patient Instructions (Addendum)
DUE TO COVID-19 ONLY ONE VISITOR IS ALLOWED TO COME WITH YOU AND "STAY IN THE WAITING ROOM ONLY"  DURING PRE OP AND PROCEDURE DAY OF SURGERY.   TWO VISITOR  MAY VISIT WITH YOU AFTER SURGERY IN YOUR PRIVATE ROOM DURING VISITING HOURS ONLY!  YOU NEED TO HAVE A COVID 19 TEST ON___5-13____ @_______ , THIS TEST MUST BE DONE BEFORE SURGERY,    COVID TESTING SITE [address] [place] [address],   IT IS ON THE RIGHT GOING OUT [address] APPROXIMATELY  2 MINUTES PAST [employer] ON THE RIGHT. ONCE YOUR COVID TEST IS COMPLETED,  PLEASE BEGIN THE QUARANTINE INSTRUCTIONS AS OUTLINED IN YOUR HANDOUT.                Mary Potter  03/05/2021   Your procedure is scheduled on: 03-17-21   Report to [facility] Main  Entrance   Report to admitting at    0930    AM     Call this number if you have problems the morning of surgery [phone]    Remember: Do not eat food  :After Midnight.you may have clear liquids           until 0830  am then nothing by mouth    CLEAR LIQUID DIET  Foods Allowed                                                                       Foods Excluded water Black Coffee and tea, regular and decaf                             liquids that you cannot  Plain Jell-O any favor except red or purple                                           see through such as: Fruit ices (not with fruit pulp)                                                     milk, soups, orange juice  Iced Popsicles                                                            All solid food Carbonated beverages, regular and diet                                    Cranberry, grape and apple juices Sports drinks like Gatorade Lightly seasoned clear broth or consume(fat free) Sugar, honey syrup   _____________________________________________________________________     BRUSH YOUR TEETH MORNING OF SURGERY AND RINSE YOUR MOUTH OUT, NO CHEWING  GUM CANDY OR  MINTS.     Take these medicines the morning of surgery with A SIP OF WATER: progesterone, metoprolol                                 You may not have any metal on your body including hair pins and              piercings  Do not wear jewelry, make-up, lotions, powders or perfumes, deodorant             Do not wear nail polish on your fingernails.  Do not shave  48 hours prior to surgery.     Do not bring valuables to the hospital. [place].  Contacts, dentures or bridgework may not be worn into surgery.       Patients discharged the day of surgery will not be allowed to drive home. IF YOU ARE HAVING SURGERY AND GOING HOME THE SAME DAY, YOU MUST HAVE AN ADULT TO DRIVE YOU HOME AND BE WITH YOU FOR 24 HOURS. YOU MAY GO HOME BY TAXI OR UBER OR ORTHERWISE, BUT AN ADULT MUST ACCOMPANY YOU HOME AND STAY WITH YOU FOR 24 HOURS.  Name and phone number of your driver:  Special Instructions: N/A              Please read over the following fact sheets you were given: _____________________________________________________________________             [facility] - Preparing for Surgery Before surgery, you can play an important role.  Because skin is not sterile, your skin needs to be as free of germs as possible.  You can reduce the number of germs on your skin by washing with CHG (chlorahexidine gluconate) soap before surgery.  CHG is an antiseptic cleaner which kills germs and bonds with the skin to continue killing germs even after washing. Please DO NOT use if you have an allergy to CHG or antibacterial soaps.  If your skin becomes reddened/irritated stop using the CHG and inform your nurse when you arrive at Short Stay. Do not shave (including legs and underarms) for at least 48 hours prior to the first CHG shower.  You may shave your face/neck. Please follow these instructions carefully:  1.  Shower with CHG Soap the night before surgery and the   morning of Surgery.  2.  If you choose to wash your hair, wash your hair first as usual with your  normal  shampoo.  3.  After you shampoo, rinse your hair and body thoroughly to remove the  shampoo.                           4.  Use CHG as you would any other liquid soap.  You can apply chg directly  to the skin and wash                       Gently with a scrungie or clean washcloth.  5.  Apply the CHG Soap to your body ONLY FROM THE NECK DOWN.   Do not use on face/ open  Wound or open sores. Avoid contact with eyes, ears mouth and genitals (private parts).                       Wash face,  Genitals (private parts) with your normal soap.             6.  Wash thoroughly, paying special attention to the area where your surgery  will be performed.  7.  Thoroughly rinse your body with warm water from the neck down.  8.  DO NOT shower/wash with your normal soap after using and rinsing off  the CHG Soap.                9.  Pat yourself dry with a clean towel.            10.  Wear clean pajamas.            11.  Place clean sheets on your bed the night of your first shower and do not  sleep with pets. Day of Surgery : Do not apply any lotions/deodorants the morning of surgery.  Please wear clean clothes to the hospital/surgery center.  FAILURE TO FOLLOW THESE INSTRUCTIONS MAY RESULT IN THE CANCELLATION OF YOUR SURGERY PATIENT SIGNATURE_________________________________  NURSE SIGNATURE__________________________________  ________________________________________________________________________

## 2021-03-05 NOTE — Progress Notes (Addendum)
PCP - Dr. Velna Hatchet  Cardiologist - Clearance Almyra Deforest [id number] epic Dr. Jenkins Rouge PPM/ICD -  Device Orders -  Rep Notified -   Chest x-ray -  EKG - 10-17-20 Stress Test - 2019 ECHO - 2019 Cardiac Cath -  CT coronary 2019 Sleep Study -  CPAP -   Fasting Blood Sugar -  Checks Blood Sugar _____ times a day  Blood Thinner Instructions:Elequis hold 3 days  Aspirin Instructions:  ERAS Protcol - PRE-SURGERY Ensure or G2-   COVID TEST- 5-13  Activity--Able to walk a flight of stairs without SOB  Walks twice a day around neighborhood Anesthesia review: A-fib,HTN  Patient denies shortness of breath, fever, cough and chest pain at PAT appointment   All instructions explained to the patient, with a verbal understanding of the material. Patient agrees to go over the instructions while at home for a better understanding. Patient also instructed to self quarantine after being tested for COVID-19. The opportunity to ask questions was provided.

## 2021-03-11 ENCOUNTER — Encounter (HOSPITAL_COMMUNITY)
Admission: RE | Admit: 2021-03-11 | Discharge: 2021-03-11 | Disposition: A | Discharge status: Home / Self Care | Payer: Medicare Other | Source: Ambulatory Visit | Attending: Gynecologic Oncology | Admitting: Gynecologic Oncology

## 2021-03-11 ENCOUNTER — Encounter (HOSPITAL_COMMUNITY): Payer: Self-pay

## 2021-03-11 ENCOUNTER — Other Ambulatory Visit: Payer: Self-pay

## 2021-03-11 DIAGNOSIS — Z01812 Encounter for preprocedural laboratory examination: Secondary | ICD-10-CM | POA: Diagnosis not present

## 2021-03-11 HISTORY — DX: Pneumonia, unspecified organism: J18.9

## 2021-03-11 HISTORY — DX: Cardiac arrhythmia, unspecified: I49.9

## 2021-03-11 HISTORY — DX: Personal history of urinary calculi: Z87.442

## 2021-03-11 HISTORY — DX: Gastro-esophageal reflux disease without esophagitis: K21.9

## 2021-03-11 LAB — CBC
HCT: 41.9 % (ref 36.0–46.0)
Hemoglobin: 13.6 g/dL (ref 12.0–15.0)
MCH: 29.5 pg (ref 26.0–34.0)
MCHC: 32.5 g/dL (ref 30.0–36.0)
MCV: 90.9 fL (ref 80.0–100.0)
Platelets: 262 10*3/uL (ref 150–400)
RBC: 4.61 MIL/uL (ref 3.87–5.11)
RDW: 13.7 % (ref 11.5–15.5)
WBC: 7.6 10*3/uL (ref 4.0–10.5)
nRBC: 0 % (ref 0.0–0.2)

## 2021-03-11 LAB — URINALYSIS, ROUTINE W REFLEX MICROSCOPIC
Bilirubin Urine: NEGATIVE
Glucose, UA: NEGATIVE mg/dL
Ketones, ur: NEGATIVE mg/dL
Nitrite: NEGATIVE
Protein, ur: NEGATIVE mg/dL
Specific Gravity, Urine: 1.012 (ref 1.005–1.030)
pH: 6 (ref 5.0–8.0)

## 2021-03-11 LAB — COMPREHENSIVE METABOLIC PANEL
ALT: 16 U/L (ref 0–44)
AST: 18 U/L (ref 15–41)
Albumin: 4.2 g/dL (ref 3.5–5.0)
Alkaline Phosphatase: 43 U/L (ref 38–126)
Anion gap: 9 (ref 5–15)
BUN: 20 mg/dL (ref 8–23)
CO2: 25 mmol/L (ref 22–32)
Calcium: 9.2 mg/dL (ref 8.9–10.3)
Chloride: 105 mmol/L (ref 98–111)
Creatinine, Ser: 0.51 mg/dL (ref 0.44–1.00)
GFR, Estimated: >60 mL/min (ref >60)
Glucose, Bld: 93 mg/dL (ref 70–99)
Potassium: 3.8 mmol/L (ref 3.5–5.1)
Sodium: 139 mmol/L (ref 135–145)
Total Bilirubin: 0.6 mg/dL (ref 0.3–1.2)
Total Protein: 7.3 g/dL (ref 6.5–8.1)

## 2021-03-11 LAB — HEMOGLOBIN A1C
Hgb A1c MFr Bld: 6.3 % — ABNORMAL HIGH (ref 4.8–5.6)
Mean Plasma Glucose: 134.11 mg/dL

## 2021-03-12 NOTE — Anesthesia Preprocedure Evaluation (Addendum)
Anesthesia Evaluation  Patient identified by MRN, date of birth, ID band Patient awake    Reviewed: Allergy & Precautions, NPO status , Patient's Chart, lab work & pertinent test results  History of Anesthesia Complications (+) PONV  Airway Mallampati: II  TM Distance: >3 FB Neck ROM: Full    Dental no notable dental hx. (+) Teeth Intact, Dental Advisory Given   Pulmonary    Pulmonary exam normal breath sounds clear to auscultation       Cardiovascular hypertension, + CAD  Normal cardiovascular exam+ dysrhythmias Atrial Fibrillation  Rhythm:Regular Rate:Normal  04/2018 echo  - Left ventricle: The cavity size was normal. Wall thickness was  increased in a pattern of mild LVH. Systolic function was normal.  The estimated ejection fraction was in the range of 60% to 65%.  - Mitral valve: There was mild regurgitation.    Neuro/Psych negative neurological ROS     GI/Hepatic Neg liver ROS, GERD  ,  Endo/Other  negative endocrine ROS  Renal/GU      Musculoskeletal negative musculoskeletal ROS (+)   Abdominal   Peds  Hematology Lab Results      Component                Value               Date                      WBC                      7.6                 03/11/2021                HGB                      13.6                03/11/2021                HCT                      41.9                03/11/2021                MCV                      90.9                03/11/2021                PLT                      262                 03/11/2021              Anesthesia Other Findings   Reproductive/Obstetrics                           Anesthesia Physical Anesthesia Plan  ASA: III  Anesthesia Plan: General   Post-op Pain Management:    Induction: Intravenous  PONV Risk Score and Plan: 4 or greater and Treatment may vary due to age or medical condition, Ondansetron, Midazolam and  Dexamethasone  Airway  Management Planned: Oral ETT  Additional Equipment: None  Intra-op Plan:   Post-operative Plan: Extubation in OR  Informed Consent: I have reviewed the patients History and Physical, chart, labs and discussed the procedure including the risks, benefits and alternatives for the proposed anesthesia with the patient or authorized representative who has indicated his/her understanding and acceptance.     Dental advisory given  Plan Discussed with: CRNA and Anesthesiologist  Anesthesia Plan Comments: (Per cardiology note 02/23/2021, "Chart reviewed as part of pre-operative protocol coverage. Patient was contacted 02/23/2021 in reference to pre-operative risk assessment for pending surgery as outlined below.  JEVAEH SHAMS was last seen on 10/17/2020 by Dr. Percival Spanish.  Since that day, CORRENE LALANI has done well without exertional chest discomfort or worsening dyspnea.  She is able to accomplish more than 4 METS of activity without any issue.  Therefore, based on ACC/AHA guidelines, the patient would be at acceptable risk for the planned procedure without further cardiovascular testing."  GA w lidocaine infusion)      Anesthesia Quick Evaluation

## 2021-03-13 ENCOUNTER — Other Ambulatory Visit (HOSPITAL_COMMUNITY)
Admission: RE | Admit: 2021-03-13 | Discharge: 2021-03-13 | Disposition: A | Discharge status: Home / Self Care | Payer: Medicare Other | Source: Ambulatory Visit | Attending: Gynecologic Oncology | Admitting: Gynecologic Oncology

## 2021-03-13 DIAGNOSIS — Z20822 Contact with and (suspected) exposure to covid-19: Secondary | ICD-10-CM | POA: Diagnosis not present

## 2021-03-13 DIAGNOSIS — N8502 Endometrial intraepithelial neoplasia [EIN]: Secondary | ICD-10-CM | POA: Diagnosis not present

## 2021-03-13 DIAGNOSIS — Z01812 Encounter for preprocedural laboratory examination: Secondary | ICD-10-CM | POA: Diagnosis not present

## 2021-03-13 DIAGNOSIS — R7303 Prediabetes: Secondary | ICD-10-CM | POA: Diagnosis not present

## 2021-03-13 DIAGNOSIS — I10 Essential (primary) hypertension: Secondary | ICD-10-CM | POA: Diagnosis not present

## 2021-03-13 DIAGNOSIS — I480 Paroxysmal atrial fibrillation: Secondary | ICD-10-CM | POA: Diagnosis not present

## 2021-03-13 DIAGNOSIS — Z7901 Long term (current) use of anticoagulants: Secondary | ICD-10-CM | POA: Diagnosis not present

## 2021-03-13 DIAGNOSIS — F418 Other specified anxiety disorders: Secondary | ICD-10-CM | POA: Diagnosis not present

## 2021-03-14 LAB — SARS CORONAVIRUS 2 (TAT 6-24 HRS): SARS Coronavirus 2: NEGATIVE

## 2021-03-16 ENCOUNTER — Telehealth: Payer: Self-pay

## 2021-03-16 NOTE — Telephone Encounter (Signed)
Ms Riehle states she understands her written pre-op instructions. She has no questions or concerns at this time. Verified that Ms Caylor's last dose of Elliquis was  Friday 03-13-21 evening.

## 2021-03-17 ENCOUNTER — Ambulatory Visit (HOSPITAL_COMMUNITY): Payer: Medicare Other | Admitting: Anesthesiology

## 2021-03-17 ENCOUNTER — Encounter (HOSPITAL_COMMUNITY): Payer: Self-pay | Admitting: Gynecologic Oncology

## 2021-03-17 ENCOUNTER — Encounter (HOSPITAL_COMMUNITY)
Admission: RE | Disposition: A | Discharge status: Home / Self Care | Payer: Self-pay | Source: Home / Self Care | Attending: Gynecologic Oncology

## 2021-03-17 ENCOUNTER — Other Ambulatory Visit: Payer: Self-pay

## 2021-03-17 ENCOUNTER — Ambulatory Visit (HOSPITAL_COMMUNITY): Payer: Medicare Other | Admitting: Physician Assistant

## 2021-03-17 ENCOUNTER — Ambulatory Visit (HOSPITAL_COMMUNITY)
Admission: RE | Admit: 2021-03-17 | Discharge: 2021-03-17 | Disposition: A | Discharge status: Home / Self Care | Payer: Medicare Other | Attending: Gynecologic Oncology | Admitting: Gynecologic Oncology

## 2021-03-17 DIAGNOSIS — Z780 Asymptomatic menopausal state: Secondary | ICD-10-CM | POA: Insufficient documentation

## 2021-03-17 DIAGNOSIS — E785 Hyperlipidemia, unspecified: Secondary | ICD-10-CM | POA: Insufficient documentation

## 2021-03-17 DIAGNOSIS — D360 Benign neoplasm of lymph nodes: Secondary | ICD-10-CM | POA: Diagnosis not present

## 2021-03-17 DIAGNOSIS — Z8049 Family history of malignant neoplasm of other genital organs: Secondary | ICD-10-CM | POA: Insufficient documentation

## 2021-03-17 DIAGNOSIS — I4891 Unspecified atrial fibrillation: Secondary | ICD-10-CM | POA: Diagnosis not present

## 2021-03-17 DIAGNOSIS — Z7901 Long term (current) use of anticoagulants: Secondary | ICD-10-CM | POA: Diagnosis not present

## 2021-03-17 DIAGNOSIS — Z888 Allergy status to other drugs, medicaments and biological substances status: Secondary | ICD-10-CM | POA: Diagnosis not present

## 2021-03-17 DIAGNOSIS — Z806 Family history of leukemia: Secondary | ICD-10-CM | POA: Insufficient documentation

## 2021-03-17 DIAGNOSIS — C541 Malignant neoplasm of endometrium: Secondary | ICD-10-CM

## 2021-03-17 DIAGNOSIS — Z809 Family history of malignant neoplasm, unspecified: Secondary | ICD-10-CM | POA: Diagnosis not present

## 2021-03-17 DIAGNOSIS — N8502 Endometrial intraepithelial neoplasia [EIN]: Secondary | ICD-10-CM | POA: Diagnosis not present

## 2021-03-17 DIAGNOSIS — Z8249 Family history of ischemic heart disease and other diseases of the circulatory system: Secondary | ICD-10-CM | POA: Diagnosis not present

## 2021-03-17 DIAGNOSIS — I10 Essential (primary) hypertension: Secondary | ICD-10-CM | POA: Diagnosis not present

## 2021-03-17 DIAGNOSIS — I480 Paroxysmal atrial fibrillation: Secondary | ICD-10-CM | POA: Diagnosis not present

## 2021-03-17 DIAGNOSIS — Z79899 Other long term (current) drug therapy: Secondary | ICD-10-CM | POA: Insufficient documentation

## 2021-03-17 DIAGNOSIS — I2510 Atherosclerotic heart disease of native coronary artery without angina pectoris: Secondary | ICD-10-CM | POA: Diagnosis not present

## 2021-03-17 HISTORY — PX: SENTINEL NODE BIOPSY: SHX6608

## 2021-03-17 HISTORY — PX: ROBOTIC ASSISTED TOTAL HYSTERECTOMY WITH BILATERAL SALPINGO OOPHERECTOMY: SHX6086

## 2021-03-17 LAB — TYPE AND SCREEN
ABO/RH(D): A POS
Antibody Screen: NEGATIVE

## 2021-03-17 LAB — ABO/RH: ABO/RH(D): A POS

## 2021-03-17 SURGERY — HYSTERECTOMY, TOTAL, ROBOT-ASSISTED, LAPAROSCOPIC, WITH BILATERAL SALPINGO-OOPHORECTOMY
Anesthesia: General

## 2021-03-17 MED ORDER — OXYCODONE HCL 5 MG/5ML PO SOLN: 5.0000 mg | Freq: Once | ORAL | Status: AC | PRN

## 2021-03-17 MED ORDER — LIDOCAINE HCL 2 % IJ SOLN
INTRAMUSCULAR | Status: AC
  Filled 2021-03-17: qty 40

## 2021-03-17 MED ORDER — DEXAMETHASONE SODIUM PHOSPHATE 4 MG/ML IJ SOLN
4.0000 mg | INTRAMUSCULAR | Status: DC
Start: 2021-03-17 — End: 2021-03-17

## 2021-03-17 MED ORDER — STERILE WATER FOR IRRIGATION IR SOLN
Status: DC | PRN
  Administered 2021-03-17: 1000 mL

## 2021-03-17 MED ORDER — LIDOCAINE 2% (20 MG/ML) 5 ML SYRINGE
INTRAMUSCULAR | Status: AC
  Filled 2021-03-17: qty 5

## 2021-03-17 MED ORDER — STERILE WATER FOR INJECTION IJ SOLN
INTRAMUSCULAR | Status: DC | PRN
  Administered 2021-03-17: 4 mL

## 2021-03-17 MED ORDER — GLYCOPYRROLATE PF 0.2 MG/ML IJ SOSY
PREFILLED_SYRINGE | INTRAMUSCULAR | Status: AC
  Filled 2021-03-17: qty 1

## 2021-03-17 MED ORDER — BUPIVACAINE HCL 0.25 % IJ SOLN
INTRAMUSCULAR | Status: DC | PRN
  Administered 2021-03-17: 26 mL

## 2021-03-17 MED ORDER — ONDANSETRON HCL 4 MG/2ML IJ SOLN
INTRAMUSCULAR | Status: AC
  Filled 2021-03-17: qty 2

## 2021-03-17 MED ORDER — SUGAMMADEX SODIUM 200 MG/2ML IV SOLN
INTRAVENOUS | Status: DC | PRN
  Administered 2021-03-17: 200 mg via INTRAVENOUS

## 2021-03-17 MED ORDER — DEXAMETHASONE SODIUM PHOSPHATE 10 MG/ML IJ SOLN
INTRAMUSCULAR | Status: AC
  Filled 2021-03-17: qty 1

## 2021-03-17 MED ORDER — PHENYLEPHRINE HCL-NACL 10-0.9 MG/250ML-% IV SOLN
INTRAVENOUS | Status: DC | PRN
  Administered 2021-03-17: 50 ug/min via INTRAVENOUS

## 2021-03-17 MED ORDER — PHENYLEPHRINE HCL (PRESSORS) 10 MG/ML IV SOLN
INTRAVENOUS | Status: AC
  Filled 2021-03-17: qty 1

## 2021-03-17 MED ORDER — OXYCODONE HCL 5 MG PO TABS
5.0000 mg | ORAL_TABLET | Freq: Once | ORAL | Status: AC | PRN
  Administered 2021-03-17: 5 mg via ORAL

## 2021-03-17 MED ORDER — PROPOFOL 10 MG/ML IV BOLUS
INTRAVENOUS | Status: AC
  Filled 2021-03-17: qty 20

## 2021-03-17 MED ORDER — PROPOFOL 10 MG/ML IV BOLUS
INTRAVENOUS | Status: DC | PRN
  Administered 2021-03-17: 130 mg via INTRAVENOUS

## 2021-03-17 MED ORDER — LIDOCAINE 2% (20 MG/ML) 5 ML SYRINGE
INTRAMUSCULAR | Status: DC | PRN
  Administered 2021-03-17: 60 mg via INTRAVENOUS

## 2021-03-17 MED ORDER — HEPARIN SODIUM (PORCINE) 5000 UNIT/ML IJ SOLN
5000.0000 [IU] | INTRAMUSCULAR | Status: AC
Start: 2021-03-17 — End: 2021-03-17
  Administered 2021-03-17: 5000 [IU] via SUBCUTANEOUS
  Filled 2021-03-17: qty 1

## 2021-03-17 MED ORDER — ACETAMINOPHEN 500 MG PO TABS
1000.0000 mg | ORAL_TABLET | ORAL | Status: AC
  Administered 2021-03-17: 1000 mg via ORAL
  Filled 2021-03-17: qty 2

## 2021-03-17 MED ORDER — FENTANYL CITRATE (PF) 100 MCG/2ML IJ SOLN
25.0000 ug | INTRAMUSCULAR | Status: DC | PRN
  Administered 2021-03-17 (×2): 50 ug via INTRAVENOUS

## 2021-03-17 MED ORDER — FENTANYL CITRATE (PF) 100 MCG/2ML IJ SOLN
INTRAMUSCULAR | Status: DC | PRN
  Administered 2021-03-17 (×2): 75 ug via INTRAVENOUS

## 2021-03-17 MED ORDER — ONDANSETRON HCL 4 MG/2ML IJ SOLN: 4.0000 mg | Freq: Once | INTRAMUSCULAR | Status: DC | PRN

## 2021-03-17 MED ORDER — STERILE WATER FOR INJECTION IJ SOLN
INTRAMUSCULAR | Status: AC
  Filled 2021-03-17: qty 10

## 2021-03-17 MED ORDER — LACTATED RINGERS IV SOLN
INTRAVENOUS | Status: DC
  Administered 2021-03-17: 1000 mL via INTRAVENOUS

## 2021-03-17 MED ORDER — AMISULPRIDE (ANTIEMETIC) 5 MG/2ML IV SOLN: 10.0000 mg | Freq: Once | INTRAVENOUS | Status: DC | PRN

## 2021-03-17 MED ORDER — LACTATED RINGERS IR SOLN
Status: DC | PRN
  Administered 2021-03-17: 1000 mL

## 2021-03-17 MED ORDER — CEFAZOLIN SODIUM-DEXTROSE 2-4 GM/100ML-% IV SOLN
2.0000 g | INTRAVENOUS | Status: AC
  Administered 2021-03-17: 2 g via INTRAVENOUS
  Filled 2021-03-17: qty 100

## 2021-03-17 MED ORDER — OXYCODONE HCL 5 MG PO TABS
ORAL_TABLET | ORAL | Status: AC
  Filled 2021-03-17: qty 1

## 2021-03-17 MED ORDER — FENTANYL CITRATE (PF) 100 MCG/2ML IJ SOLN
INTRAMUSCULAR | Status: AC
  Filled 2021-03-17: qty 2

## 2021-03-17 MED ORDER — ACETAMINOPHEN 10 MG/ML IV SOLN
1000.0000 mg | Freq: Once | INTRAVENOUS | Status: DC | PRN
  Administered 2021-03-17: 1000 mg via INTRAVENOUS

## 2021-03-17 MED ORDER — BUPIVACAINE HCL 0.25 % IJ SOLN
INTRAMUSCULAR | Status: AC
  Filled 2021-03-17: qty 1

## 2021-03-17 MED ORDER — ROCURONIUM BROMIDE 10 MG/ML (PF) SYRINGE
PREFILLED_SYRINGE | INTRAVENOUS | Status: AC
  Filled 2021-03-17: qty 10

## 2021-03-17 MED ORDER — SODIUM CHLORIDE 0.9% FLUSH
3.0000 mL | Freq: Two times a day (BID) | INTRAVENOUS | Status: DC
Start: 2021-03-17 — End: 2021-03-17

## 2021-03-17 MED ORDER — ORAL CARE MOUTH RINSE: 15.0000 mL | Freq: Once | OROMUCOSAL | Status: AC

## 2021-03-17 MED ORDER — FENTANYL CITRATE (PF) 250 MCG/5ML IJ SOLN
INTRAMUSCULAR | Status: AC
  Filled 2021-03-17: qty 5

## 2021-03-17 MED ORDER — CHLORHEXIDINE GLUCONATE 0.12 % MT SOLN
15.0000 mL | Freq: Once | OROMUCOSAL | Status: AC
  Administered 2021-03-17: 15 mL via OROMUCOSAL

## 2021-03-17 MED ORDER — LIDOCAINE HCL (PF) 2 % IJ SOLN
INTRAMUSCULAR | Status: DC | PRN
  Administered 2021-03-17: 1.5 mg/kg/h via INTRADERMAL

## 2021-03-17 MED ORDER — ROCURONIUM BROMIDE 10 MG/ML (PF) SYRINGE
PREFILLED_SYRINGE | INTRAVENOUS | Status: DC | PRN
  Administered 2021-03-17: 60 mg via INTRAVENOUS
  Administered 2021-03-17: 40 mg via INTRAVENOUS

## 2021-03-17 MED ORDER — ACETAMINOPHEN 10 MG/ML IV SOLN
INTRAVENOUS | Status: AC
  Filled 2021-03-17: qty 100

## 2021-03-17 MED ORDER — DEXAMETHASONE SODIUM PHOSPHATE 10 MG/ML IJ SOLN
INTRAMUSCULAR | Status: DC | PRN
  Administered 2021-03-17: 10 mg via INTRAVENOUS

## 2021-03-17 SURGICAL SUPPLY — 70 items
ADH SKN CLS APL DERMABOND .7 (GAUZE/BANDAGES/DRESSINGS) ×1
AGENT HMST KT MTR STRL THRMB (HEMOSTASIS)
APL ESCP 34 STRL LF DISP (HEMOSTASIS)
APPLICATOR SURGIFLO ENDO (HEMOSTASIS) IMPLANT
BACTOSHIELD CHG 4% 4OZ (MISCELLANEOUS) ×1
BAG LAPAROSCOPIC 12 15 PORT 16 (BASKET) IMPLANT
BAG RETRIEVAL 12/15 (BASKET)
BAG SPEC RTRVL LRG 6X4 10 (ENDOMECHANICALS)
BLADE SURG SZ10 CARB STEEL (BLADE) IMPLANT
COVER BACK TABLE 60X90IN (DRAPES) ×2 IMPLANT
COVER TIP SHEARS 8 DVNC (MISCELLANEOUS) ×1 IMPLANT
COVER TIP SHEARS 8MM DA VINCI (MISCELLANEOUS) ×2
COVER WAND RF STERILE (DRAPES) IMPLANT
DECANTER SPIKE VIAL GLASS SM (MISCELLANEOUS) IMPLANT
DERMABOND ADVANCED (GAUZE/BANDAGES/DRESSINGS) ×1
DERMABOND ADVANCED .7 DNX12 (GAUZE/BANDAGES/DRESSINGS) ×1 IMPLANT
DRAPE ARM DVNC X/XI (DISPOSABLE) ×4 IMPLANT
DRAPE COLUMN DVNC XI (DISPOSABLE) ×1 IMPLANT
DRAPE DA VINCI XI ARM (DISPOSABLE) ×8
DRAPE DA VINCI XI COLUMN (DISPOSABLE) ×2
DRAPE SHEET LG 3/4 BI-LAMINATE (DRAPES) ×2 IMPLANT
DRAPE SURG IRRIG POUCH 19X23 (DRAPES) ×2 IMPLANT
DRSG OPSITE POSTOP 4X6 (GAUZE/BANDAGES/DRESSINGS) IMPLANT
DRSG OPSITE POSTOP 4X8 (GAUZE/BANDAGES/DRESSINGS) IMPLANT
ELECT PENCIL ROCKER SW 15FT (MISCELLANEOUS) IMPLANT
ELECT REM PT RETURN 15FT ADLT (MISCELLANEOUS) ×2 IMPLANT
GLOVE SURG ENC MOIS LTX SZ6 (GLOVE) ×8 IMPLANT
GLOVE SURG ENC MOIS LTX SZ6.5 (GLOVE) ×4 IMPLANT
GOWN STRL REUS W/ TWL LRG LVL3 (GOWN DISPOSABLE) ×4 IMPLANT
GOWN STRL REUS W/TWL LRG LVL3 (GOWN DISPOSABLE) ×8
HOLDER FOLEY CATH W/STRAP (MISCELLANEOUS) ×2 IMPLANT
IRRIG SUCT STRYKERFLOW 2 WTIP (MISCELLANEOUS) ×2
IRRIGATION SUCT STRKRFLW 2 WTP (MISCELLANEOUS) ×1 IMPLANT
KIT PROCEDURE DA VINCI SI (MISCELLANEOUS)
KIT PROCEDURE DVNC SI (MISCELLANEOUS) IMPLANT
KIT TURNOVER KIT A (KITS) ×2 IMPLANT
MANIPULATOR UTERINE 4.5 ZUMI (MISCELLANEOUS) ×2 IMPLANT
NDL HYPO 21X1.5 SAFETY (NEEDLE) ×1 IMPLANT
NDL SPNL 18GX3.5 QUINCKE PK (NEEDLE) IMPLANT
NEEDLE HYPO 21X1.5 SAFETY (NEEDLE) ×2 IMPLANT
NEEDLE SPNL 18GX3.5 QUINCKE PK (NEEDLE) IMPLANT
OBTURATOR OPTICAL STANDARD 8MM (TROCAR) ×2
OBTURATOR OPTICAL STND 8 DVNC (TROCAR) ×1
OBTURATOR OPTICALSTD 8 DVNC (TROCAR) ×1 IMPLANT
PACK ROBOT GYN CUSTOM WL (TRAY / TRAY PROCEDURE) ×2 IMPLANT
PAD POSITIONING PINK XL (MISCELLANEOUS) ×2 IMPLANT
PORT ACCESS TROCAR AIRSEAL 12 (TROCAR) ×1 IMPLANT
PORT ACCESS TROCAR AIRSEAL 5M (TROCAR) ×1
POUCH SPECIMEN RETRIEVAL 10MM (ENDOMECHANICALS) IMPLANT
SCRUB CHG 4% DYNA-HEX 4OZ (MISCELLANEOUS) ×1 IMPLANT
SEAL CANN UNIV 5-8 DVNC XI (MISCELLANEOUS) ×4 IMPLANT
SEAL XI 5MM-8MM UNIVERSAL (MISCELLANEOUS) ×8
SET TRI-LUMEN FLTR TB AIRSEAL (TUBING) ×2 IMPLANT
SPONGE LAP 18X18 RF (DISPOSABLE) IMPLANT
SURGIFLO W/THROMBIN 8M KIT (HEMOSTASIS) IMPLANT
SUT MNCRL AB 4-0 PS2 18 (SUTURE) IMPLANT
SUT PDS AB 1 TP1 96 (SUTURE) IMPLANT
SUT VIC AB 0 CT1 27 (SUTURE)
SUT VIC AB 0 CT1 27XBRD ANTBC (SUTURE) IMPLANT
SUT VIC AB 2-0 CT1 27 (SUTURE)
SUT VIC AB 2-0 CT1 TAPERPNT 27 (SUTURE) IMPLANT
SUT VIC AB 4-0 PS2 18 (SUTURE) ×4 IMPLANT
SYR 10ML LL (SYRINGE) IMPLANT
TOWEL OR NON WOVEN STRL DISP B (DISPOSABLE) ×2 IMPLANT
TRAP SPECIMEN MUCUS 40CC (MISCELLANEOUS) IMPLANT
TRAY FOLEY MTR SLVR 16FR STAT (SET/KITS/TRAYS/PACK) ×2 IMPLANT
TROCAR XCEL NON-BLD 5MMX100MML (ENDOMECHANICALS) IMPLANT
UNDERPAD 30X36 HEAVY ABSORB (UNDERPADS AND DIAPERS) ×2 IMPLANT
WATER STERILE IRR 1000ML POUR (IV SOLUTION) ×2 IMPLANT
YANKAUER SUCT BULB TIP 10FT TU (MISCELLANEOUS) IMPLANT

## 2021-03-17 NOTE — Anesthesia Postprocedure Evaluation (Signed)
Anesthesia Post Note  Patient: Mary Potter  Procedure(s) Performed: XI ROBOTIC ASSISTED TOTAL HYSTERECTOMY WITH BILATERAL SALPINGO OOPHORECTOMY (N/A ) SENTINEL NODE BIOPSY (N/A )     Patient location during evaluation: PACU Anesthesia Type: General Level of consciousness: awake and alert Pain management: pain level controlled Vital Signs Assessment: post-procedure vital signs reviewed and stable Respiratory status: spontaneous breathing, nonlabored ventilation, respiratory function stable and patient connected to nasal cannula oxygen Cardiovascular status: blood pressure returned to baseline and stable Postop Assessment: no apparent nausea or vomiting Anesthetic complications: no   No complications documented.  Last Vitals:  Vitals:   03/17/21 1345 03/17/21 1412  BP: (!) 111/94 (!) 170/77  Pulse: (!) 59   Resp: 10 10  Temp: 36.7 C 36.5 C  SpO2: 99%     Last Pain:  Vitals:   03/17/21 1412  TempSrc: Oral  PainSc: 0-No pain                 Barnet Glasgow

## 2021-03-17 NOTE — Op Note (Signed)
OPERATIVE NOTE  Pre-operative Diagnosis: CAH  Post-operative Diagnosis: same  Operation: Robotic-assisted laparoscopic total hysterectomy with bilateral salpingoophorectomy, SLN biopsy   Surgeon: Jeral Pinch MD  Assistant Surgeon: Lahoma Crocker MD (an MD assistant was necessary for tissue manipulation, management of robotic instrumentation, retraction and positioning due to the complexity of the case and hospital policies).   Anesthesia: GET  Urine Output: 75cc  Operative Findings: On EUA, small mobile uterus. Normal upper abdominal survey on intra-abdominal entry. Normal omentum, small and large bowel. 6-8 cm uterus, normal appearing. Normal bilateral adnexa. Mapping successful to bilateral SLNs. No intra-abdominal or pelvic evidence of disease.  Estimated Blood Loss:  less than 100 mL      Total IV Fluids: see I&O flowsheet         Specimens: uterus, cervix, bilateral tubes and ovaries, right external iliac SLNs (2), left obturator SLN         Complications:  None apparent; patient tolerated the procedure well.         Disposition: PACU - hemodynamically stable.  Procedure Details  The patient was seen in the Holding Room. The risks, benefits, complications, treatment options, and expected outcomes were discussed with the patient.  The patient concurred with the proposed plan, giving informed consent.  The site of surgery properly noted/marked. The patient was identified as Mary Potter and the procedure verified as a Robotic-assisted hysterectomy with bilateral salpingo oophorectomy with SLN biopsy.   After induction of anesthesia, the patient was draped and prepped in the usual sterile manner. Patient was placed in supine position after anesthesia and draped and prepped in the usual sterile manner as follows: Her arms were tucked to her side with all appropriate precautions.  The shoulders were stabilized with padded shoulder blocks applied to the acromium processes.   The patient was placed in the semi-lithotomy position in [place].  The perineum and vagina were prepped with CholoraPrep. The patient was draped after the CholoraPrep had been allowed to dry for 3 minutes.  A Time Out was held and the above information confirmed.  The urethra was prepped with Betadine. Foley catheter was placed.  A sterile speculum was placed in the vagina.  The cervix was grasped with a single-tooth tenaculum. 2mg  total of ICG was injected into the cervical stroma at 2 and 9 o'clock with 1cc injected at a 1cm and 56mm depth (concentration 0.5mg /ml) in all locations. The cervix was dilated with Kennon Rounds dilators.  The ZUMI uterine manipulator with a medium colpotomizer ring was placed without difficulty.  A pneum occluder balloon was placed over the manipulator.  OG tube placement was confirmed and to suction.   Next, a 10 mm skin incision was made 1 cm below the subcostal margin in the midclavicular line.  The 5 mm Optiview port and scope was used for direct entry.  Opening pressure was under 10 mm CO2.  The abdomen was insufflated and the findings were noted as above.   At this point and all points during the procedure, the patient's intra-abdominal pressure did not exceed 15 mmHg. Next, an 8 mm skin incision was made superior to the umbilicus and a right and left port were placed about 8 cm lateral to the robot port on the right and left side.  A fourth arm was placed on the right.  The 5 mm assist trocar was exchanged for a 10-12 mm port. All ports were placed under direct visualization.  The patient was placed in steep Trendelenburg.  Bowel  was folded away into the upper abdomen.  The robot was docked in the normal manner.  The right and left peritoneum were opened parallel to the IP ligament to open the retroperitoneal spaces bilaterally. The round ligaments were transected. The SLN mapping was performed in bilateral pelvic basins. After identifying the ureters, the para rectal and  paravesical spaces were opened up entirely with careful dissection below the level of the ureters bilaterally and to the depth of the uterine artery origin in order to skeletonize the uterine "web" and ensure visualization of all parametrial channels. The para-aortic basins were carefully exposed and evaluated for isolated para-aortic SLN's. Lymphatic channels were identified travelling to the following visualized sentinel lymph node's: two right external iliac SLNs, left obturator SLN. These SLN's were separated from their surrounding lymphatic tissue, removed and sent for permanent pathology.  The hysterectomy was started.  The ureter was again noted to be on the medial leaf of the broad ligament.  The peritoneum above the ureter was incised and stretched and the infundibulopelvic ligament was skeletonized, cauterized and cut.  The posterior peritoneum was taken down to the level of the KOH ring.  The anterior peritoneum was also taken down.  The bladder flap was created to the level of the KOH ring.  The uterine artery on the right side was skeletonized, cauterized and cut in the normal manner.  A similar procedure was performed on the left.  The colpotomy was made and the uterus, cervix, bilateral ovaries and tubes were amputated and delivered through the vagina.  Pedicles were inspected and excellent hemostasis was achieved.    The colpotomy at the vaginal cuff was closed with Vicryl on a CT1 needle in a running manner.  Irrigation was used and excellent hemostasis was achieved.  At this point in the procedure was completed.  Robotic instruments were removed under direct visulaization.  The robot was undocked. The fascia at the 10-12 mm port was closed with 0 Vicryl on a UR-5 needle.  The subcuticular tissue was closed with 4-0 Vicryl and the skin was closed with 4-0 Monocryl in a subcuticular manner.  Dermabond was applied.    The vagina was swabbed with  minimal bleeding noted. Foley catheter was  removed.  All sponge, lap and needle counts were correct x  3.   The patient was transferred to the recovery room in stable condition.  Jeral Pinch, MD

## 2021-03-17 NOTE — Transfer of Care (Signed)
Immediate Anesthesia Transfer of Care Note  Patient: Mary Potter  Procedure(s) Performed: XI ROBOTIC ASSISTED TOTAL HYSTERECTOMY WITH BILATERAL SALPINGO OOPHORECTOMY (N/A ) SENTINEL NODE BIOPSY (N/A )  Patient Location: PACU  Anesthesia Type:General  Level of Consciousness: awake, alert , oriented and patient cooperative  Airway & Oxygen Therapy: Patient Spontanous Breathing and Patient connected to face mask oxygen  Post-op Assessment: Report given to RN and Post -op Vital signs reviewed and stable  Post vital signs: Reviewed and stable  Last Vitals:  Vitals Value Taken Time  BP 165/74 03/17/21 1236  Temp    Pulse    Resp 9 03/17/21 1239  SpO2    Vitals shown include unvalidated device data.  Last Pain:  Vitals:   03/17/21 0925  TempSrc: Oral         Complications: No complications documented.

## 2021-03-17 NOTE — Anesthesia Procedure Notes (Signed)
Procedure Name: Intubation Performed by: Cleda Daub, CRNA Pre-anesthesia Checklist: Patient identified, Emergency Drugs available, Suction available and Patient being monitored Patient Re-evaluated:Patient Re-evaluated prior to induction Oxygen Delivery Method: Circle system utilized Preoxygenation: Pre-oxygenation with 100% oxygen Induction Type: IV induction Ventilation: Mask ventilation without difficulty Laryngoscope Size: Mac and 3 Grade View: Grade I Tube type: Oral Tube size: 7.0 mm Number of attempts: 1 Airway Equipment and Method: Stylet and Oral airway Placement Confirmation: ETT inserted through vocal cords under direct vision,  positive ETCO2 and breath sounds checked- equal and bilateral Secured at: 21 cm Tube secured with: Tape Dental Injury: Teeth and Oropharynx as per pre-operative assessment

## 2021-03-17 NOTE — Interval H&P Note (Signed)
History and Physical Interval Note: Discussed surgical plan again - patient amenable to proceed with SLN biopsy.   03/17/2021 10:17 AM  Mary Potter  has presented today for surgery, with the diagnosis of COMPLEX ATYPICAL ENDOMETRIAL HYPERPLASIA.  The various methods of treatment have been discussed with the patient and family. After consideration of risks, benefits and other options for treatment, the patient has consented to  Procedure(s): XI ROBOTIC ASSISTED TOTAL HYSTERECTOMY WITH BILATERAL SALPINGO OOPHORECTOMY WITH POSSIBLE LAPAROTOMY (N/A) POSSIBLE SENTINEL NODE BIOPSY (N/A) POSSIBLE LYMPH NODE DISSECTION (N/A) as a surgical intervention.  The patient's history has been reviewed, patient examined, no change in status, stable for surgery.  I have reviewed the patient's chart and labs.  Questions were answered to the patient's satisfaction.     Mary Potter

## 2021-03-17 NOTE — Discharge Instructions (Signed)
03/17/2021   Please wait 48 hours to restart your Eliquis after surgery.  Activity: 1. Be up and out of the bed during the day.  Take a nap if needed.  You may walk up steps but be careful and use the hand rail.  Stair climbing will tire you more than you think, you may need to stop part way and rest.   2. No lifting or straining for 6 weeks.  3. No driving until you are off narcotics and can brake safely. For most patients, this is 1-2 weeks.    4. Shower daily.  Use soap and water on your incision and pat dry; don't rub.   5. No sexual activity and nothing in the vagina for 8 weeks.  Medications:  - Take ibuprofen and tylenol first line for pain control. Take these regularly (every 6 hours) to decrease the build up of pain.  - If necessary, for severe pain not relieved by ibuprofen, take oxycodone.  - While taking oxycodone you should take sennakot every night to reduce the likelihood of constipation. If this causes diarrhea, stop its use.  Diet: 1. Low sodium Heart Healthy Diet is recommended.  2. It is safe to use a laxative if you have difficulty moving your bowels.   Wound Care: 1. Keep clean and dry.  Shower daily.  Reasons to call the Doctor:   Fever - Oral temperature greater than 100.4 degrees Fahrenheit  Foul-smelling vaginal discharge  Difficulty urinating  Nausea and vomiting  Increased pain at the site of the incision that is unrelieved with pain medicine.  Difficulty breathing with or without chest pain  New calf pain especially if only on one side  Sudden, continuing increased vaginal bleeding with or without clots.   Follow-up: 1. See Jeral Pinch in 3 weeks as scheduled. You will have a phone visit (see appointments) next week to review final pathology.  Contacts: For questions or concerns you should contact:  Dr. Jeral Pinch at [phone] After hours and on week-ends call [phone] and ask to speak to the physician on call for  Gynecologic Oncology

## 2021-03-18 ENCOUNTER — Encounter (HOSPITAL_COMMUNITY): Payer: Self-pay | Admitting: Gynecologic Oncology

## 2021-03-18 ENCOUNTER — Telehealth: Payer: Self-pay | Admitting: Oncology

## 2021-03-18 NOTE — Telephone Encounter (Signed)
Called Andre and she is doing good after surgery.  She is a little sore and is taking Tylenol q 5-6 hours.  She reports having burning with urination last night that has resolved.  She did have a bowel movement this morning.  She denies having a fever.  She does report having some vaginal spotting.  Advised her to call if she has any questions or needs.

## 2021-03-19 ENCOUNTER — Telehealth: Payer: Self-pay

## 2021-03-19 NOTE — Telephone Encounter (Signed)
Mary Potter states that she had surgery on 03-17-21 wit Dr. Berline Lopes. She took the 2 tabs of senokot-s the night of surgery.  She had 2 loose stools yesterday and 5 times today. Afebrile.  Denies shaking chills. Some abdominal cramping with BM. No N/V. Mary Orren states that she has IBS and that the senokot may have triggered it. Reviewed with Zoila Shutter and she said that she can take an imodium to see if this helps. She needs to push fluids 8 oz every 2 hours while a wake for the next couple of days. She is to call the office  if diarrhea continues.  Pt verbalized understanding.

## 2021-03-20 ENCOUNTER — Encounter: Payer: Self-pay | Admitting: Gynecologic Oncology

## 2021-03-20 ENCOUNTER — Inpatient Hospital Stay: Payer: Medicare Other | Attending: Gynecologic Oncology | Admitting: Gynecologic Oncology

## 2021-03-20 DIAGNOSIS — C541 Malignant neoplasm of endometrium: Secondary | ICD-10-CM | POA: Insufficient documentation

## 2021-03-20 NOTE — Progress Notes (Signed)
Gynecologic Oncology Telehealth Consult Note: Gyn-Onc  I connected with Mary Potter on 03/20/21 at 11:45 AM EDT by telephone and verified that I am speaking with the correct person using two identifiers.  I discussed the limitations, risks, security and privacy concerns of performing an evaluation and management service by telemedicine and the availability of in-person appointments. I also discussed with the patient that there may be a patient responsible charge related to this service. The patient expressed understanding and agreed to proceed.  Other persons participating in the visit and their role in the encounter: none.  Patient's location: home  Reason for Visit: follow-up after surgery, pathology discussion  Treatment History: Oncology History  Endometrial adenocarcinoma ([place])  02/10/2021 Imaging   Pelvic ultrasound: thickened endometrium with slight vascularity at the fundus.  The lining measured 13.5 mm.     02/16/2021 Initial Biopsy   EMB: [facility]   03/17/2021 Surgery   Robotic-assisted laparoscopic total hysterectomy with bilateral salpingoophorectomy, SLN biopsy   Findings: On EUA, small mobile uterus. Normal upper abdominal survey on intra-abdominal entry. Normal omentum, small and large bowel. 6-8 cm uterus, normal appearing. Normal bilateral adnexa. Mapping successful to bilateral SLNs. No intra-abdominal or pelvic evidence of disease.   03/17/2021 Pathology Results   A. SENTINEL LYMPH NODE, RIGHT EXTERNAL ILIAC, EXCISION:  - One lymph node negative for metastatic carcinoma (0/1).   B. SENTINEL LYMPH NODE, LEFT OBTURATOR, EXCISION:  - One lymph node negative for metastatic carcinoma (0/1).   C. 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      Focal superficial myometrial invasion.      No lymphovascular invasion.      Cervix, bilateral ovaries and bilateral  fallopian tubes negative for  carcinoma.   ONCOLOGY TABLE:  UTERUS, CARCINOMA OR CARCINOSARCOMA: Resection  Procedure: Total hysterectomy, bilateral salpingo-oophorectomy with  right and left sentinel lymph nodes.  Histologic Type: Endometrioid adenocarcinoma.  Histologic Grade: FIGO grade 1.  Myometrial Invasion:       Depth of Myometrial Invasion: 2 mm.       Myometrial Thickness (mm): 25.       Percentage of Myometrial Invasion: 8%.  Uterine Serosa Involvement: Not identified.  Cervical stromal Involvement: Not identified.  Extent of involvement of other tissue/organs: Not identified.  Lymphovascular Invasion: Not identified.  Regional Lymph Nodes:       Pelvic Lymph Nodes Examined: 2                                      Sentinel: 2                                      Non-sentinel: 0                                      Total: 2       Lymph Nodes with Metastasis: 0  Pathologic Stage Classification (pTNM, AJCC 8th Edition): pT1a, pN0  Ancillary Studies: MMR and MSI testing have been ordered.  Representative Tumor Block: C3-C9.  Comment(s): Cytokeratin AE1/AE3 performed on the sentinel lymph nodes  (parts A and B) is negative for metastatic carcinoma.  (v4.2.0.1)  03/20/2021 Initial Diagnosis   Endometrial adenocarcinoma ([name])     Interval History: Patient reports recovery is going well, continues to improve daily. Using tylenol prn now for pain. Reports minimal vaginal spotting. Denies fevers, chills, nausea or emesis. Tolerating regular diet. Had some diarrhea yesterday. Mild burning with urinary day after surgery, now resolved.   Past Medical/Surgical History: Past Medical History:  Diagnosis Date  . Atrial fibrillation ([place])   . Dysrhythmia    A-fib  . 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 GERD (gastroesophageal reflux disease)   . History of kidney stones   . Hyperlipidemia   . Hypertension   . Pneumonia   . PONV (postoperative nausea and vomiting)    slow to  wake up  . Thickened endometrium 01/03/2015  . Vaginal delivery 1976, 1985    Past Surgical History:  Procedure Laterality Date  . ANKLE FRACTURE SURGERY    . CATARACT EXTRACTION Right 2020  . DILATION AND CURETTAGE OF UTERUS  2012   ablation   . EYE SURGERY     Corneal growth removal on right eye  . HYSTEROSCOPY WITH D & C N/A 01/03/2015   Procedure: DILATATION AND CURETTAGE /HYSTEROSCOPY;  Surgeon: [name], MD;  Location: [facility];  Service: Gynecology;  Laterality: N/A;  . LAPAROSCOPY     for endometriosis  . ROBOTIC ASSISTED TOTAL HYSTERECTOMY WITH BILATERAL SALPINGO OOPHERECTOMY N/A 03/17/2021   Procedure: XI ROBOTIC ASSISTED TOTAL HYSTERECTOMY WITH BILATERAL SALPINGO OOPHORECTOMY;  Surgeon: [name], MD;  Location: [facility];  Service: Gynecology;  Laterality: N/A;  . SENTINEL NODE BIOPSY N/A 03/17/2021   Procedure: SENTINEL NODE BIOPSY;  Surgeon: [name], MD;  Location: [facility];  Service: Gynecology;  Laterality: N/A;  . TONSILLECTOMY AND ADENOIDECTOMY      Family History  Problem Relation Age of Onset  . Heart disease Mother 21       CABG  . Stroke Father   . Prostate cancer Father   . Heart disease Brother 13       Transplant, died 16 years  . Heart disease Maternal Grandmother   . Heart disease Paternal Grandmother   . Cancer Paternal Grandfather   . Heart disease Brother 12       RF  . Leukemia Brother   . Leukemia Brother   . Hypertension Sister   . Cervical cancer Sister   . Colon cancer Neg Hx   . Breast cancer Neg Hx   . Ovarian cancer Neg Hx   . Endometrial cancer Neg Hx   . Pancreatic cancer Neg Hx     Social History   Socioeconomic History  . Marital status: Widowed    Spouse name: Mary Potter  . Number of children: 2  . Years of education: 16  . Highest education level: Not on file  Occupational History  . Occupation: retired    Comment: [employer]  Tobacco Use  . Smoking status: Never Smoker  . Smokeless tobacco: Never Used   Vaping Use  . Vaping Use: Never used  Substance and Sexual Activity  . Alcohol use: No    Alcohol/week: 0.0 standard drinks  . Drug use: No  . Sexual activity: Yes    Birth control/protection: Post-menopausal  Other Topics Concern  . Not on file  Social History Narrative   Lives with her younger daughter, [name]. Her older daughter lives in [place] with her family.   Social Determinants of Health   Financial Resource Strain: Not on file  Food  Insecurity: Not on file  Transportation Needs: Not on file  Physical Activity: Not on file  Stress: Not on file  Social Connections: Not on file    Current Medications:  Current Outpatient Medications:  .  apixaban (ELIQUIS) 5 MG TABS tablet, Take 5 mg by mouth 2 (two) times daily., Disp: , Rfl:  .  cholecalciferol (VITAMIN D) 1000 UNITS tablet, Take 1,000 Units by mouth every evening. , Disp: , Rfl:  .  metoprolol tartrate (LOPRESSOR) 50 MG tablet, Take 50 mg by mouth 2 (two) times daily., Disp: , Rfl:  .  olmesartan (BENICAR) 40 MG tablet, Take 40 mg by mouth at bedtime., Disp: , Rfl:  .  Polyethyl Glycol-Propyl Glycol (SYSTANE OP), Place 1 drop into both eyes 2 (two) times daily as needed (dry eyes)., Disp: , Rfl:  .  rosuvastatin (CRESTOR) 10 MG tablet, Take 10 mg by mouth every Monday, Wednesday, and Friday., Disp: , Rfl:  .  senna-docusate (SENOKOT-S) 8.6-50 MG tablet, Take 2 tablets by mouth at bedtime. For AFTER surgery, do not take if having diarrhea, Disp: 30 tablet, Rfl: 0 .  traMADol (ULTRAM) 50 MG tablet, Take 1 tablet (50 mg total) by mouth every 6 (six) hours as needed for severe pain. For AFTER surgery only, do not take and drive, Disp: 10 tablet, Rfl: 0  Review of Symptoms: Pertinent positives as per HPI.  Physical Exam: There were no vitals taken for this visit.  Laboratory & Radiologic Studies: None new  Assessment & Plan: Mary Potter is a 75 y.o. woman with Stage IA grade 1 endometrioid endometrial  adenocarcinoma who presents for follow-up after surgery, treatment discussion.  Meeting post-op milestones. Discussed continued restrictions and expectations for a post-op standpoint. Reviewed final pathology, endometrioid adenocarcinoma seen in background of CAH. Low-risk disease, no adjuvant treatment indicated. All questions answered.  I discussed the assessment and treatment plan with the patient. The patient was provided with an opportunity to ask questions and all were answered. The patient agreed with the plan and demonstrated an understanding of the instructions.   The patient was advised to call back or see an in-person evaluation if the symptoms worsen or if the condition fails to improve as anticipated.   22 minutes of total time was spent for this patient encounter, including preparation, face-to-face counseling with the patient and coordination of care, and documentation of the encounter.   Jeral Pinch, MD  Division of Gynecologic Oncology  Department of Obstetrics and Gynecology  [facility]

## 2021-03-23 ENCOUNTER — Ambulatory Visit: Payer: Medicare Other | Admitting: Gynecologic Oncology

## 2021-03-23 LAB — SURGICAL PATHOLOGY

## 2021-04-02 ENCOUNTER — Encounter (HOSPITAL_COMMUNITY): Payer: Self-pay | Admitting: Gynecologic Oncology

## 2021-04-07 ENCOUNTER — Encounter: Payer: Self-pay | Admitting: Gynecologic Oncology

## 2021-04-07 ENCOUNTER — Inpatient Hospital Stay: Payer: Medicare Other | Attending: Gynecologic Oncology | Admitting: Gynecologic Oncology

## 2021-04-07 ENCOUNTER — Other Ambulatory Visit: Payer: Self-pay

## 2021-04-07 VITALS — BP 163/91 | HR 72 | Temp 97.4°F | Resp 18 | Ht 63.0 in | Wt 153.6 lb

## 2021-04-07 DIAGNOSIS — Z90710 Acquired absence of both cervix and uterus: Secondary | ICD-10-CM

## 2021-04-07 DIAGNOSIS — C541 Malignant neoplasm of endometrium: Secondary | ICD-10-CM

## 2021-04-07 DIAGNOSIS — Z90722 Acquired absence of ovaries, bilateral: Secondary | ICD-10-CM

## 2021-04-07 NOTE — Progress Notes (Signed)
Gynecologic Oncology Return Clinic Visit  04/07/21  Reason for Visit: post-op follow-up  Treatment History: Oncology History Overview Note  MMR IHC intact MSI stable   Endometrial adenocarcinoma ([place])  02/10/2021 Imaging   Pelvic ultrasound: thickened endometrium with slight vascularity at the fundus.  The lining measured 13.5 mm.     02/16/2021 Initial Biopsy   EMB: [facility]   03/17/2021 Surgery   Robotic-assisted laparoscopic total hysterectomy with bilateral salpingoophorectomy, SLN biopsy   Findings: On EUA, small mobile uterus. Normal upper abdominal survey on intra-abdominal entry. Normal omentum, small and large bowel. 6-8 cm uterus, normal appearing. Normal bilateral adnexa. Mapping successful to bilateral SLNs. No intra-abdominal or pelvic evidence of disease.   03/17/2021 Pathology Results   A. SENTINEL LYMPH NODE, RIGHT EXTERNAL ILIAC, EXCISION:  - One lymph node negative for metastatic carcinoma (0/1).   B. SENTINEL LYMPH NODE, LEFT OBTURATOR, EXCISION:  - One lymph node negative for metastatic carcinoma (0/1).   C. 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      Focal superficial myometrial invasion.      No lymphovascular invasion.      Cervix, bilateral ovaries and bilateral fallopian tubes negative for  carcinoma.   ONCOLOGY TABLE:  UTERUS, CARCINOMA OR CARCINOSARCOMA: Resection  Procedure: Total hysterectomy, bilateral salpingo-oophorectomy with  right and left sentinel lymph nodes.  Histologic Type: Endometrioid adenocarcinoma.  Histologic Grade: FIGO grade 1.  Myometrial Invasion:       Depth of Myometrial Invasion: 2 mm.       Myometrial Thickness (mm): 25.       Percentage of Myometrial Invasion: 8%.  Uterine Serosa Involvement: Not identified.  Cervical stromal Involvement: Not identified.  Extent of involvement of other tissue/organs: Not  identified.  Lymphovascular Invasion: Not identified.  Regional Lymph Nodes:       Pelvic Lymph Nodes Examined: 2                                      Sentinel: 2                                      Non-sentinel: 0                                      Total: 2       Lymph Nodes with Metastasis: 0  Pathologic Stage Classification (pTNM, AJCC 8th Edition): pT1a, pN0  Ancillary Studies: MMR and MSI testing have been ordered.  Representative Tumor Block: C3-C9.  Comment(s): Cytokeratin AE1/AE3 performed on the sentinel lymph nodes  (parts A and B) is negative for metastatic carcinoma.  (v4.2.0.1)    03/20/2021 Initial Diagnosis   Endometrial adenocarcinoma ([facility])     Interval History: The patient presents today for follow-up after surgery.  She reports doing very well at home.  She denies any significant abdominal or pelvic pain.  She has been off pain medications for some time now.  She feels a little bit of pressure when she has to have a bowel movement, denies any issues with constipation.  She denies any urinary symptoms.  She denies vaginal bleeding or discharge.  She endorses  a good appetite without nausea or emesis.  She denies fevers or chills.  Past Medical/Surgical History: Past Medical History:  Diagnosis Date  . Atrial fibrillation ([place])   . Dysrhythmia    A-fib  . 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 GERD (gastroesophageal reflux disease)   . History of kidney stones   . Hyperlipidemia   . Hypertension   . Pneumonia   . PONV (postoperative nausea and vomiting)    slow to wake up  . Thickened endometrium 01/03/2015  . Vaginal delivery 1976, 1985    Past Surgical History:  Procedure Laterality Date  . ANKLE FRACTURE SURGERY    . CATARACT EXTRACTION Right 2020  . DILATION AND CURETTAGE OF UTERUS  2012   ablation   . EYE SURGERY     Corneal growth removal on right eye  . HYSTEROSCOPY WITH D & C N/A 01/03/2015   Procedure: DILATATION AND CURETTAGE /HYSTEROSCOPY;   Surgeon: [name], MD;  Location: [facility];  Service: Gynecology;  Laterality: N/A;  . LAPAROSCOPY     for endometriosis  . ROBOTIC ASSISTED TOTAL HYSTERECTOMY WITH BILATERAL SALPINGO OOPHERECTOMY N/A 03/17/2021   Procedure: XI ROBOTIC ASSISTED TOTAL HYSTERECTOMY WITH BILATERAL SALPINGO OOPHORECTOMY;  Surgeon: [name], MD;  Location: [facility];  Service: Gynecology;  Laterality: N/A;  . SENTINEL NODE BIOPSY N/A 03/17/2021   Procedure: SENTINEL NODE BIOPSY;  Surgeon: [name], MD;  Location: [facility];  Service: Gynecology;  Laterality: N/A;  . TONSILLECTOMY AND ADENOIDECTOMY      Family History  Problem Relation Age of Onset  . Heart disease Mother 47       CABG  . Stroke Father   . Prostate cancer Father   . Heart disease Brother 46       Transplant, died 26 years  . Heart disease Maternal Grandmother   . Heart disease Paternal Grandmother   . Cancer Paternal Grandfather   . Heart disease Brother 12       RF  . Leukemia Brother   . Leukemia Brother   . Hypertension Sister   . Cervical cancer Sister   . Colon cancer Neg Hx   . Breast cancer Neg Hx   . Ovarian cancer Neg Hx   . Endometrial cancer Neg Hx   . Pancreatic cancer Neg Hx     Social History   Socioeconomic History  . Marital status: Widowed    Spouse name: [name]  . Number of children: 2  . Years of education: 9  . Highest education level: Not on file  Occupational History  . Occupation: retired    Comment: [employer]  Tobacco Use  . Smoking status: Never Smoker  . Smokeless tobacco: Never Used  Vaping Use  . Vaping Use: Never used  Substance and Sexual Activity  . Alcohol use: No    Alcohol/week: 0.0 standard drinks  . Drug use: No  . Sexual activity: Yes    Birth control/protection: Post-menopausal  Other Topics Concern  . Not on file  Social History Narrative   Lives with her younger daughter, [name]. Her older daughter lives in [place] with her family.   Social Determinants  of Health   Financial Resource Strain: Not on file  Food Insecurity: Not on file  Transportation Needs: Not on file  Physical Activity: Not on file  Stress: Not on file  Social Connections: Not on file    Current Medications:  Current Outpatient Medications:  .  apixaban (ELIQUIS) 5 MG TABS  tablet, Take 5 mg by mouth 2 (two) times daily., Disp: , Rfl:  .  cholecalciferol (VITAMIN D) 1000 UNITS tablet, Take 1,000 Units by mouth every evening. , Disp: , Rfl:  .  metoprolol tartrate (LOPRESSOR) 50 MG tablet, Take 50 mg by mouth 2 (two) times daily., Disp: , Rfl:  .  olmesartan (BENICAR) 40 MG tablet, Take 40 mg by mouth at bedtime., Disp: , Rfl:  .  Polyethyl Glycol-Propyl Glycol (SYSTANE OP), Place 1 drop into both eyes 2 (two) times daily as needed (dry eyes)., Disp: , Rfl:  .  rosuvastatin (CRESTOR) 10 MG tablet, Take 10 mg by mouth every Monday, Wednesday, and Friday., Disp: , Rfl:  .  senna-docusate (SENOKOT-S) 8.6-50 MG tablet, Take 2 tablets by mouth at bedtime. For AFTER surgery, do not take if having diarrhea, Disp: 30 tablet, Rfl: 0 .  traMADol (ULTRAM) 50 MG tablet, Take 1 tablet (50 mg total) by mouth every 6 (six) hours as needed for severe pain. For AFTER surgery only, do not take and drive, Disp: 10 tablet, Rfl: 0  Review of Systems: Denies appetite changes, fevers, chills, fatigue, unexplained weight changes. Denies hearing loss, neck lumps or masses, mouth sores, ringing in ears or voice changes. Denies cough or wheezing.  Denies shortness of breath. Denies chest pain or palpitations. Denies leg swelling. Denies abdominal distention, pain, blood in stools, constipation, diarrhea, nausea, vomiting, or early satiety. Denies pain with intercourse, dysuria, frequency, hematuria or incontinence. Denies hot flashes, pelvic pain, vaginal bleeding or vaginal discharge.   Denies joint pain, back pain or muscle pain/cramps. Denies itching, rash, or wounds. Denies dizziness,  headaches, numbness or seizures. Denies swollen lymph nodes or glands, denies easy bruising or bleeding. Denies anxiety, depression, confusion, or decreased concentration.  Physical Exam: BP (!) 163/91 (BP Location: Right Arm, Patient Position: Sitting)   Pulse 72   Temp (!) 97.4 F (36.3 C) (Tympanic)   Resp 18   Ht $R'5\' 3"'rj$  (1.6 m)   Wt 153 lb 9.6 oz (69.7 kg)   SpO2 100%   BMI 27.21 kg/m  General: Alert, oriented, no acute distress. HEENT: Atraumatic, normocephalic, sclera anicteric. Chest: Unlabored breathing on room air. Abdomen: soft, nontender.  Normoactive bowel sounds.  No masses or hepatosplenomegaly appreciated.  Well-healing laparoscopic incisions, remaining Dermabond removed. Extremities: Grossly normal range of motion.  Warm, well perfused.  No edema bilaterally. Skin: No rashes or lesions noted. GU: Normal appearing external genitalia without erythema, excoriation, or lesions.  Speculum exam reveals cuff intact, sutures visible, no bleeding or discharge.  Bimanual exam reveals cuff intact, no fluctuance or tenderness with palpation.    Laboratory & Radiologic Studies: None new  Assessment & Plan: Mary Potter is a 75 y.o. woman with Stage IA grade 1 endometrioid endometrial adenocarcinoma who presents for follow-up after surgery, treatment discussion.  Patient is healing very well from surgery.  She is meeting all milestones.  We discussed continued activity restrictions and limitations for the next 3-5 weeks.  In terms of her estrogen repletion, she has been off her estrogen patch since before I saw her with her new endometrial cancer diagnosis.  We discussed that data shows a low-dose estrogen replacement in somebody with early stage uterine cancer is safe and does not impact her oncologic outcomes.  She is overall done well from a side effect standpoint.  I have asked her to keep track of her symptoms in the next month.  If she feels like she would benefit from  being back on the low-dose estrogen patch, I have asked her to call me.  We discussed signs and symptoms that be concerning for disease recurrence.  Patient understands that given her low risk, early stage disease, no adjuvant therapy is recommended.  We discussed NCCN and SGO surveillance recommendations, which differ some after the first year.  We will plan on visits every 6 months for a year and then revisit whether the patient would feel more comfortable with visits every 6 months or transitioning to yearly visits.  28 minutes of total time was spent for this patient encounter, including preparation, face-to-face counseling with the patient and coordination of care, and documentation of the encounter.  Jeral Pinch, MD  Division of Gynecologic Oncology  Department of Obstetrics and Gynecology  [facility]

## 2021-04-07 NOTE — Patient Instructions (Signed)
It was great to see you today!  You are healing well from surgery.  Remember no lifting more than 10-15 pounds until 6 weeks out from surgery and nothing in the vagina until 8.  I will see you in 6 months for follow-up.  If you develop any new symptoms in the meantime, please call to see me sooner.  The symptoms would include vaginal bleeding, abdominal pain, unintentional weight loss, and change in bowel function.  Please let me know over the next month or so if you are starting to have a lot of symptoms related to being off of your estrogen.  It would be safe to restart your low-dose estrogen patch if you feel like we need to do that.  Just call my office and let me know.

## 2021-04-17 MED ORDER — MIDAZOLAM HCL 2 MG/2ML IJ SOLN
INTRAMUSCULAR | Status: AC
  Filled 2021-04-17: qty 2

## 2021-04-17 MED ORDER — FENTANYL CITRATE (PF) 100 MCG/2ML IJ SOLN
INTRAMUSCULAR | Status: AC
  Filled 2021-04-17: qty 2

## 2021-04-17 MED ORDER — PROPOFOL 10 MG/ML IV BOLUS
INTRAVENOUS | Status: AC
  Filled 2021-04-17: qty 40

## 2021-04-27 ENCOUNTER — Other Ambulatory Visit: Payer: Self-pay | Admitting: Cardiology

## 2021-04-27 MED ORDER — APIXABAN 5 MG PO TABS: 5.0000 mg | ORAL_TABLET | Freq: Two times a day (BID) | ORAL | 1 refills | Status: DC

## 2021-04-27 NOTE — Telephone Encounter (Signed)
71f, 69.7kg, scr 0.51 03/11/21, lovw/hochrein 10/17/20

## 2021-04-27 NOTE — Telephone Encounter (Signed)
*  STAT* If patient is at the pharmacy, call can be transferred to refill team.   1. Which medications need to be refilled? (please list name of each medication and dose if known) apixaban (ELIQUIS) 5 MG TABS tablet  2. Which pharmacy/location (including street and city if local pharmacy) is medication to be sent to? [facility] - [place], [address]  3. Do they need a 30 day or 90 day supply? 90 day

## 2021-05-11 ENCOUNTER — Telehealth: Payer: Self-pay | Admitting: Cardiology

## 2021-05-11 DIAGNOSIS — N8502 Endometrial intraepithelial neoplasia [EIN]: Secondary | ICD-10-CM

## 2021-05-11 NOTE — Telephone Encounter (Signed)
*  STAT* If patient is at the pharmacy, call can be transferred to refill team.   1. Which medications need to be refilled? (please list name of each medication and dose if known) traMADol (ULTRAM) 50 MG tablet  2. Which pharmacy/location (including street and city if local pharmacy) is medication to be sent to? [facility] - [place], [address]  3. Do they need a 30 day or 90 day supply? [place]

## 2021-05-11 NOTE — Telephone Encounter (Signed)
Patient called to see if she can get any samples of apixaban (ELIQUIS) 5 MG TABS tablet because the order that was sent in for her through the mail meds she hasnt received.    Patient also whats to know if her hot flashes is do to her going into afib.Please advise

## 2021-05-11 NOTE — Telephone Encounter (Signed)
No samples available. Patient aware. She states has has enough until Saturday. She states she has spoken to mail order and medication is on route. RN informed patient she can call mid week if medication has not arrived.

## 2021-05-24 ENCOUNTER — Encounter: Payer: Self-pay | Admitting: Gastroenterology

## 2021-05-28 ENCOUNTER — Telehealth: Payer: Self-pay

## 2021-05-28 ENCOUNTER — Other Ambulatory Visit: Payer: Self-pay | Admitting: Gynecologic Oncology

## 2021-05-28 ENCOUNTER — Telehealth: Payer: Self-pay | Admitting: Cardiology

## 2021-05-28 NOTE — Telephone Encounter (Signed)
Called patient, LVM to call back to discuss.  Left call back number.   

## 2021-05-28 NOTE — Progress Notes (Unsigned)
I called the patient back to discuss her symptoms.  She has had worsening hot flashes since several weeks after surgery.  Initially this was just a couple of times a day but they have become angry creasingly bothersome and more frequent.  Additionally, she has noticed that she is now in episodes of A. fib at least daily.  She has a monitor at home that she can check in when she feels the symptoms she is a reasoning for them.  She voiced being somewhat frustrated at having been told to different things about hormone replacement.  She and I discussed again the role that estrogen plays in the development of the cancer type that she has.  We have also previously talked about the data that we have which would suggest no significant increase in the risk of recurrence in patients with low risk, early stage (stage I and stage II) endometrial cancer.  While we cannot definitively say that she will not have an increased risk of recurrence, I think the data would support cautious use in somebody like her.  She is quite symptomatic, my suspicion is that her cardiac symptoms are also related to estrogen withdrawal.  She has been on hormone replacement since her mid 48s.  We discussed that Effexor, which was recommended by her gynecologist, could help with her hot flashes.  I am suspicious that it will not help improve her cardiac symptoms.  I feel comfortable with her putting an estrogen patch on (she uses the lowest dose Climara patch).  This is her preference.  She will let me know next week how she is feeling.  Jeral Pinch MD Gynecologic Oncology

## 2021-05-28 NOTE — Telephone Encounter (Signed)
Contacted patient, she states that she just recently had a hysterectomy and since this visit she has had episodes of her afib/palpitations. She states her blood pressure and hr has been okay- she states the highest it has been is 133, she took her medications and it came right down. Patient states normally it stays in the 80's. Patient is not sure if this is related to her hormones being all over the place but feels she shouldn't wait to be seen in December with Dr.Hochrein. She denies chest pain, shortness of breath does mention some dizziness if she moves quickly. We did discuss ED precautions for any new or worsening symptoms.  Patient verbalized understanding.  Made patient a sooner appointment with Coletta Memos, NP at [facility].  Will route to NP to make aware.  Thanks!

## 2021-05-28 NOTE — Telephone Encounter (Signed)
Patient c/o Palpitations:  High priority if patient c/o lightheadedness, shortness of breath, or chest pain  How long have you had palpitations/irregular HR/ Afib? Are you having the symptoms now?  About 3 weeks inconsistently. Consistently since Saturday   Are you currently experiencing lightheadedness, SOB or CP? no  Do you have a history of afib (atrial fibrillation) or irregular heart rhythm? Yes   Have you checked your BP or HR? (document readings if available):   120/70   Are you experiencing any other symptoms? Fatigue   Patient had a complete hysterectomy 03/17/21 and was taken off her estrogen. She has finished her recovery time but has been in afib for the past 3 weeks. She feels tired mostly. She also has hot flashes but that may be due to the lack of estrogen.   She is concerned because she does not have an appt to see Dr. Percival Spanish until December. Please call patient to discuss symptoms

## 2021-05-28 NOTE — Telephone Encounter (Signed)
Pt is returning a call  

## 2021-05-28 NOTE — Telephone Encounter (Signed)
Received phone call from [facility] seeking clarification. Patient states she had a hysterectomy on 03/17/21, she has since been experiencing hot flashes which she states she expected. She has a hx of afib, she reports an increase in episodes and has reached out to her cardiologist. She is wondering if there is a correlation between the decreased estrogen and an increase in her afib.  Patient reached out to Dr. Benjie Karvonen about her hot flashes. She states Dr. Benjie Karvonen does not recommend estrogen with her cancer hx and recommends Effexor. Patient states she is very confused as Dr. Berline Lopes said an estrogen patch was ok.

## 2021-06-03 ENCOUNTER — Encounter: Payer: Self-pay | Admitting: Gynecologic Oncology

## 2021-06-05 ENCOUNTER — Telehealth: Payer: Self-pay | Admitting: Cardiology

## 2021-06-05 ENCOUNTER — Other Ambulatory Visit (HOSPITAL_COMMUNITY): Payer: Self-pay | Admitting: Nurse Practitioner

## 2021-06-05 NOTE — Telephone Encounter (Signed)
 *  STAT* If patient is at the pharmacy, call can be transferred to refill team.   1. Which medications need to be refilled? (please list name of each medication and dose if known)   metoprolol tartrate (LOPRESSOR) 50 MG tablet  2. Which pharmacy/location (including street and city if local pharmacy) is medication to be sent to?  [facility] - [place], [address]  3. Do they need a 30 day or 90 day supply? 90 days   [facility] but she has been released from there and needs her refill sent in.

## 2021-06-07 NOTE — Progress Notes (Signed)
Cardiology Office Note:    Date:  06/07/2021   ID:  DELAYNEE Potter, DOB 1945-12-13, MRN [id number]  PCP:  Mary Hatchet, MD   [facility] Providers Cardiologist:  Minus Breeding, MD      Referring MD: Mary Hatchet, MD   Evaluation of palpitations  History of Present Illness:    Mary Potter is a 75 y.o. female with a hx of paroxysmal atrial fibrillation, essential hypertension, GERD, and hyperlipidemia.  She previously reported chest pain, her echocardiogram was normal and her exercise treadmill test was negative.  She did have some calcium noted on her CT.  She was seen by Dr. Percival Potter on 10/17/2020.  During that time she reported she had microscopic blood in her urine.  She reported that she did have a slight decrease in her hemoglobin.  Source of bleeding was not found.  She denied seeing blood in her urine and reported tolerating her anticoagulation well.  She does have documented cases of atrial fibrillation on her mobile device.  She denied presyncope and syncope.  She remains physically active working out 2 times per day.  She denied chest discomfort, arm and neck discomfort.  She denied shortness of breath.  She contacted the nurse triage line on 05/28/2021 and reported that she had recently had a hysterectomy.  Since that time she had noticed episodes of increased palpitations.  She reported that her blood pressure and heart rate were okay.  Her highest heart rate had been 133.  She reported that she took her medications and her heart rate came down into the 80s.  She denied chest pain, shortness of breath, but did notice dizziness with quick movements.  She presents the clinic today for evaluation states she feels well.  She contacted her oncologist who recommended she go back on her hormone replacement therapy.  She started back on her estrogen therapy after being off for 6 to 8 weeks.  She reports that she started on her HRT on Thursday and by Saturday her heart  rate had returned to normal.  She also has not had any further hot flashes.  We will continue her current medication, have her follow-up as scheduled in December, have her continue her physical activity, and continue to monitor.  Today she denies chest pain, shortness of breath, lower extremity edema, fatigue, palpitations, melena, hematuria, hemoptysis, diaphoresis, weakness, presyncope, syncope, orthopnea, and PND.   Past Medical History:  Diagnosis Date   Atrial fibrillation ([place])    Dysrhythmia    A-fib   Endometrial hyperplasia 01/03/2015   GERD (gastroesophageal reflux disease)    History of kidney stones    Hyperlipidemia    Hypertension    Pneumonia    PONV (postoperative nausea and vomiting)    slow to wake up   Thickened endometrium 01/03/2015   Vaginal delivery 1976, 1985    Past Surgical History:  Procedure Laterality Date   ANKLE FRACTURE SURGERY     CATARACT EXTRACTION Right 2020   DILATION AND CURETTAGE OF UTERUS  2012   ablation    EYE SURGERY     Corneal growth removal on right eye   HYSTEROSCOPY WITH D & C N/A 01/03/2015   Procedure: DILATATION AND CURETTAGE /HYSTEROSCOPY;  Surgeon: Mary Fallen, MD;  Location: [facility];  Service: Gynecology;  Laterality: N/A;   LAPAROSCOPY     for endometriosis   ROBOTIC ASSISTED TOTAL HYSTERECTOMY WITH BILATERAL SALPINGO OOPHERECTOMY N/A 03/17/2021   Procedure: XI ROBOTIC ASSISTED TOTAL HYSTERECTOMY WITH  BILATERAL SALPINGO OOPHORECTOMY;  Surgeon: Mary Mosses, MD;  Location: [facility];  Service: Gynecology;  Laterality: N/A;   SENTINEL NODE BIOPSY N/A 03/17/2021   Procedure: SENTINEL NODE BIOPSY;  Surgeon: Mary Mosses, MD;  Location: [facility];  Service: Gynecology;  Laterality: N/A;   TONSILLECTOMY AND ADENOIDECTOMY      Current Medications: No outpatient medications have been marked as taking for the 06/09/21 encounter (Appointment) with Mary Pelton, NP.     Allergies:   Talwin [pentazocine]   Social History    Socioeconomic History   Marital status: Widowed    Spouse name: Mary Potter   Number of children: 2   Years of education: 14   Highest education level: Not on file  Occupational History   Occupation: retired    Comment: [employer]  Tobacco Use   Smoking status: Never   Smokeless tobacco: Never  Vaping Use   Vaping Use: Never used  Substance and Sexual Activity   Alcohol use: No    Alcohol/week: 0.0 standard drinks   Drug use: No   Sexual activity: Yes    Birth control/protection: Post-menopausal  Other Topics Concern   Not on file  Social History Narrative   Lives with her younger daughter, Mary Potter. Her older daughter lives in [place] with her family.   Social Determinants of Health   Financial Resource Strain: Not on file  Food Insecurity: Not on file  Transportation Needs: Not on file  Physical Activity: Not on file  Stress: Not on file  Social Connections: Not on file     Family History: The patient's family history includes Cancer in her paternal grandfather; Cervical cancer in her sister; Heart disease in her maternal grandmother and paternal grandmother; Heart disease (age of onset: 44) in her brother; Heart disease (age of onset: 90) in her brother; Heart disease (age of onset: 19) in her mother; Hypertension in her sister; Leukemia in her brother and brother; Prostate cancer in her father; Stroke in her father. There is no history of Colon cancer, Breast cancer, Ovarian cancer, Endometrial cancer, or Pancreatic cancer.  ROS:   Please see the history of present illness.     All other systems reviewed and are negative.   Risk Assessment/Calculations:    CHA2DS2-VASc Score = 3   This indicates a 3.2% annual risk of stroke. The patient's score is based upon: CHF History: No HTN History: Yes Diabetes History: No Stroke History: No Vascular Disease History: No Age Score: 1 Gender Score: 1          Physical Exam:    VS:  There were no vitals taken for this  visit.    Wt Readings from Last 3 Encounters:  04/07/21 153 lb 9.6 oz (69.7 kg)  03/17/21 152 lb (68.9 kg)  03/11/21 152 lb (68.9 kg)     GEN:  Well nourished, well developed in no acute distress HEENT: Normal NECK: No JVD; No carotid bruits LYMPHATICS: No lymphadenopathy CARDIAC: RRR, no murmurs, rubs, gallops RESPIRATORY:  Clear to auscultation without rales, wheezing or rhonchi  ABDOMEN: Soft, non-tender, non-distended MUSCULOSKELETAL:  No edema; No deformity  SKIN: Warm and dry NEUROLOGIC:  Alert and oriented x 3 PSYCHIATRIC:  Normal affect    EKGs/Labs/Other Studies Reviewed:    The following studies were reviewed today: Echocardiogram 04/27/2018 Study Conclusions   - Left ventricle: The cavity size was normal. Wall thickness was    increased in a pattern of mild LVH. Systolic function was normal.  The estimated ejection fraction was in the range of 60% to 65%.  - Mitral valve: There was mild regurgitation.   EKG:  EKG is ordered today.  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  Reports more frequent intermittent episodes of irregular heart rate/palpitations.  Recent hysterectomy.  Reports compliance with apixaban and denies bleeding issues. Continue apixaban, metoprolol  Heart healthy low-sodium diet-salty 6 given Increase physical activity as tolerated Avoid triggers for palpitations caffeine, chocolate, EtOH, dehydration etc.   Hypertension-BP today 150/86.  Well-controlled at home.  110s over 70s.  Has been checking twice per week. Continue  metoprolol Heart healthy low-sodium diet-salty 6 given Increase physical activity as tolerated  Chest pain-no recent episodes of arm neck back or chest discomfort.  Underwent stress test 05/09/2018 which showed normal response with blood pressure to exercise and no ST deviation. Increase physical activity as tolerated Heart healthy low-sodium diet  Disposition: Follow-up with Dr. Percival Potter or me in 3-4 months.       Medication Adjustments/Labs and Tests Ordered: Current medicines are reviewed at length with the patient today.  Concerns regarding medicines are outlined above.  No orders of the defined types were placed in this encounter.  No orders of the defined types were placed in this encounter.   There are no Patient Instructions on file for this visit.   Signed, Mary Pelton, NP  06/07/2021 5:45 PM      Notice: This dictation was prepared with Dragon dictation along with smaller phrase technology. Any transcriptional errors that result from this process are unintentional and may not be corrected upon review.  I spent 13 minutes examining this patient, reviewing medications, and using patient centered shared decision making involving her cardiac care.  Prior to her visit I spent greater than 20 minutes reviewing her past medical history,  medications, and prior cardiac tests.

## 2021-06-09 ENCOUNTER — Ambulatory Visit (INDEPENDENT_AMBULATORY_CARE_PROVIDER_SITE_OTHER): Payer: Medicare Other | Admitting: General Practice

## 2021-06-09 ENCOUNTER — Encounter (HOSPITAL_BASED_OUTPATIENT_CLINIC_OR_DEPARTMENT_OTHER): Payer: Self-pay | Admitting: General Practice

## 2021-06-09 ENCOUNTER — Other Ambulatory Visit: Payer: Self-pay

## 2021-06-09 VITALS — BP 158/86 | HR 76 | Ht 63.5 in | Wt 156.0 lb

## 2021-06-09 DIAGNOSIS — R931 Abnormal findings on diagnostic imaging of heart and coronary circulation: Secondary | ICD-10-CM

## 2021-06-09 DIAGNOSIS — I10 Essential (primary) hypertension: Secondary | ICD-10-CM | POA: Diagnosis not present

## 2021-06-09 DIAGNOSIS — I480 Paroxysmal atrial fibrillation: Secondary | ICD-10-CM | POA: Diagnosis not present

## 2021-06-09 NOTE — Patient Instructions (Signed)
Medication Instructions:  Your physician recommends that you continue on your current medications as directed. Please refer to the Current Medication list given to you today.  *If you need a refill on your cardiac medications before your next appointment, please call your pharmacy*  Follow-Up: At [facility], you and your health needs are our priority.  As part of our continuing mission to provide you with exceptional heart care, we have created designated Provider Care Teams.  These Care Teams include your primary Cardiologist (physician) and Advanced Practice Providers (APPs -  Physician Assistants and Nurse Practitioners) who all work together to provide you with the care you need, when you need it.  We recommend signing up for the patient portal called "MyChart".  Sign up information is provided on this After Visit Summary.  MyChart is used to connect with patients for Virtual Visits (Telemedicine).  Patients are able to view lab/test results, encounter notes, upcoming appointments, etc.  Non-urgent messages can be sent to your provider as well.   To learn more about what you can do with MyChart, go to [email or web address].    Your next appointment:   Keep you follow-up appointment with Dr. Percival Spanish in December---10/12/21 at 10:20 AM.    Other Instructions Atrial Fibrillation  Follow these instructions at home: Medicines Take over-the counter and prescription medicines only as told by your health care provider. Do not take any new medicines without talking to your health care provider. If you are taking blood thinners: Talk with your health care provider before you take any medicines that contain aspirin or NSAIDs, such as ibuprofen. These medicines increase your risk for dangerous bleeding. Take your medicine exactly as told, at the same time every day. Avoid activities that could cause injury or bruising, and follow instructions about how to prevent falls. Wear a medical alert  bracelet or carry a card that lists what medicines you take. Lifestyle     Do not use any products that contain nicotine or tobacco, such as cigarettes, e-cigarettes, and chewing tobacco. If you need help quitting, ask your health care provider. Eat heart-healthy foods. Talk with a dietitian to make an eating plan that is right for you. Exercise regularly as told by your health care provider. Do not drink alcohol. Lose weight if you are overweight. Do not use drugs, including cannabis. General instructions If you have obstructive sleep apnea, manage your condition as told by your health care provider. Do not use diet pills unless your health care provider approves. Diet pills can make heart problems worse. Keep all follow-up visits as told by your health care provider. This is important. Contact a health care provider if you: Notice a change in the rate, rhythm, or strength of your heartbeat. Are taking a blood thinner and you notice more bruising. Tire more easily when you exercise or do heavy work. Have a sudden change in weight. Get help right away if you have:  Chest pain, abdominal pain, sweating, or weakness. Trouble breathing. Side effects of blood thinners, such as blood in your vomit, stool, or urine, or bleeding that cannot stop. Any symptoms of a stroke. "BE FAST" is an easy way to remember the main warning signs of a stroke: B - Balance. Signs are dizziness, sudden trouble walking, or loss of balance. E - Eyes. Signs are trouble seeing or a sudden change in vision. F - Face. Signs are sudden weakness or numbness of the face, or the face or eyelid drooping on one side. A -  Arms. Signs are weakness or numbness in an arm. This happens suddenly and usually on one side of the body. S - Speech. Signs are sudden trouble speaking, slurred speech, or trouble understanding what people say. T - Time. Time to call emergency services. Write down what time symptoms started. Other signs  of a stroke, such as: A sudden, severe headache with no known cause. Nausea or vomiting. Seizure. These symptoms may represent a serious problem that is an emergency. Do not wait to see if the symptoms will go away. Get medical help right away. Call your local emergency services (911 in the [place] Do not drive yourself to the hospital. Summary Atrial fibrillation is a type of irregular or rapid heartbeat (arrhythmia). Symptoms include a feeling that your heart is beating fast or irregularly. You may be given medicines to prevent blood clots or to treat heart rate or heart rhythm problems. Get help right away if you have signs or symptoms of a stroke. Get help right away if you cannot catch your breath or have chest pain or pressure.   This information is not intended to replace advice given to you by your health care provider. Make sure you discuss any questions you have with your healthcare provider. Document Revised: 04/11/2019 Document Reviewed: 04/11/2019 Elsevier Patient Education  [place].

## 2021-06-30 ENCOUNTER — Telehealth: Payer: Self-pay | Admitting: Cardiology

## 2021-06-30 NOTE — Telephone Encounter (Signed)
Patient c/o Palpitations:  High priority if patient c/o lightheadedness, shortness of breath, or chest pain  How long have you had palpitations/irregular HR/ Afib? Are you having the symptoms now? Yesterday morning until midnight  Are you currently experiencing lightheadedness, SOB or CP? lightheadedness  Do you have a history of afib (atrial fibrillation) or irregular heart rhythm? yes  Have you checked your BP or HR? (document readings if available): 145/126 HR 134  Are you experiencing any other symptoms?  no

## 2021-06-30 NOTE — Telephone Encounter (Signed)
Spoke with pt, she reports yesterday was hard. She was in a fib from morning until evening. She reports her heart rate did not get elevated until later in the day and she took extra 1/2 of metoprolol, then after the evening 50 mg dose things settled down. This morning her kardia device confirms she is back in rhythm. She wants to let dr hochrein know to make sure she is doing everything that she needs to do. Will forward for dr hochrein to review.

## 2021-07-01 NOTE — Telephone Encounter (Signed)
Left message for patient to take the extra metoprolol as needed for the atrial fib and to avoid any stimulants that may trigger the rhythm to reoccur.

## 2021-07-13 DIAGNOSIS — R7303 Prediabetes: Secondary | ICD-10-CM | POA: Diagnosis not present

## 2021-07-13 DIAGNOSIS — E785 Hyperlipidemia, unspecified: Secondary | ICD-10-CM | POA: Diagnosis not present

## 2021-07-13 DIAGNOSIS — I10 Essential (primary) hypertension: Secondary | ICD-10-CM | POA: Diagnosis not present

## 2021-07-13 DIAGNOSIS — M859 Disorder of bone density and structure, unspecified: Secondary | ICD-10-CM | POA: Diagnosis not present

## 2021-07-29 DIAGNOSIS — Z23 Encounter for immunization: Secondary | ICD-10-CM | POA: Diagnosis not present

## 2021-07-29 DIAGNOSIS — I480 Paroxysmal atrial fibrillation: Secondary | ICD-10-CM | POA: Diagnosis not present

## 2021-07-29 DIAGNOSIS — D692 Other nonthrombocytopenic purpura: Secondary | ICD-10-CM | POA: Diagnosis not present

## 2021-07-29 DIAGNOSIS — R3129 Other microscopic hematuria: Secondary | ICD-10-CM | POA: Diagnosis not present

## 2021-07-29 DIAGNOSIS — R809 Proteinuria, unspecified: Secondary | ICD-10-CM | POA: Diagnosis not present

## 2021-07-29 DIAGNOSIS — Z1331 Encounter for screening for depression: Secondary | ICD-10-CM | POA: Diagnosis not present

## 2021-07-29 DIAGNOSIS — Z0000 Encounter for general adult medical examination without abnormal findings: Secondary | ICD-10-CM | POA: Diagnosis not present

## 2021-07-29 DIAGNOSIS — D6869 Other thrombophilia: Secondary | ICD-10-CM | POA: Diagnosis not present

## 2021-07-29 DIAGNOSIS — Z1339 Encounter for screening examination for other mental health and behavioral disorders: Secondary | ICD-10-CM | POA: Diagnosis not present

## 2021-07-29 DIAGNOSIS — E785 Hyperlipidemia, unspecified: Secondary | ICD-10-CM | POA: Diagnosis not present

## 2021-07-29 DIAGNOSIS — I10 Essential (primary) hypertension: Secondary | ICD-10-CM | POA: Diagnosis not present

## 2021-08-06 DIAGNOSIS — H25812 Combined forms of age-related cataract, left eye: Secondary | ICD-10-CM | POA: Diagnosis not present

## 2021-08-06 DIAGNOSIS — Z961 Presence of intraocular lens: Secondary | ICD-10-CM | POA: Diagnosis not present

## 2021-08-06 DIAGNOSIS — H16223 Keratoconjunctivitis sicca, not specified as Sjogren's, bilateral: Secondary | ICD-10-CM | POA: Diagnosis not present

## 2021-08-06 DIAGNOSIS — Z9889 Other specified postprocedural states: Secondary | ICD-10-CM | POA: Diagnosis not present

## 2021-08-06 DIAGNOSIS — H18452 Nodular corneal degeneration, left eye: Secondary | ICD-10-CM | POA: Diagnosis not present

## 2021-09-14 ENCOUNTER — Telehealth: Payer: Self-pay

## 2021-09-14 ENCOUNTER — Other Ambulatory Visit: Payer: Self-pay | Admitting: Gynecologic Oncology

## 2021-09-14 DIAGNOSIS — I480 Paroxysmal atrial fibrillation: Secondary | ICD-10-CM

## 2021-09-14 DIAGNOSIS — E2839 Other primary ovarian failure: Secondary | ICD-10-CM

## 2021-09-14 MED ORDER — ESTRADIOL 0.025 MG/24HR TD PTTW: 1.0000 | MEDICATED_PATCH | TRANSDERMAL | 3 refills | Status: DC

## 2021-09-14 NOTE — Progress Notes (Signed)
See RN note.

## 2021-09-14 NOTE — Telephone Encounter (Signed)
Received call from Mary Potter requesting a medication refill for Estradiol 0.025 mg. Patient requests medication sent by [facility]. Patient next office visit 10/09/2021.

## 2021-09-14 NOTE — Telephone Encounter (Signed)
Spoke with Mary Potter to let her know [name], NP sent in refill for Estradiol 0.025 mg.  Patient stated that she is doing much better with estrogen and is having occasional A-Fib that is not lasting long.  Checks with EKG when needed.  Patients next cardiology visit is 10/12/2021.  Instructed to call with any questions or concerns.

## 2021-10-08 NOTE — Progress Notes (Signed)
Gynecologic Oncology Return Clinic Visit  10/09/21  Reason for Visit: surveillance visit in the setting of uterine cancer  Treatment History: Oncology History Overview Note  MMR IHC intact MSI stable   Endometrial adenocarcinoma ([place])  02/10/2021 Imaging   Pelvic ultrasound: thickened endometrium with slight vascularity at the fundus.  The lining measured 13.5 mm.     02/16/2021 Initial Biopsy   EMB: [facility]   03/17/2021 Surgery   Robotic-assisted laparoscopic total hysterectomy with bilateral salpingoophorectomy, SLN biopsy   Findings: On EUA, small mobile uterus. Normal upper abdominal survey on intra-abdominal entry. Normal omentum, small and large bowel. 6-8 cm uterus, normal appearing. Normal bilateral adnexa. Mapping successful to bilateral SLNs. No intra-abdominal or pelvic evidence of disease.   03/17/2021 Pathology Results   A. SENTINEL LYMPH NODE, RIGHT EXTERNAL ILIAC, EXCISION:  - One lymph node negative for metastatic carcinoma (0/1).   B. SENTINEL LYMPH NODE, LEFT OBTURATOR, EXCISION:  - One lymph node negative for metastatic carcinoma (0/1).   C. 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      Focal superficial myometrial invasion.      No lymphovascular invasion.      Cervix, bilateral ovaries and bilateral fallopian tubes negative for  carcinoma.   ONCOLOGY TABLE:  UTERUS, CARCINOMA OR CARCINOSARCOMA: Resection  Procedure: Total hysterectomy, bilateral salpingo-oophorectomy with  right and left sentinel lymph nodes.  Histologic Type: Endometrioid adenocarcinoma.  Histologic Grade: FIGO grade 1.  Myometrial Invasion:       Depth of Myometrial Invasion: 2 mm.       Myometrial Thickness (mm): 25.       Percentage of Myometrial Invasion: 8%.  Uterine Serosa Involvement: Not identified.  Cervical stromal Involvement: Not identified.  Extent of involvement of  other tissue/organs: Not identified.  Lymphovascular Invasion: Not identified.  Regional Lymph Nodes:       Pelvic Lymph Nodes Examined: 2                                      Sentinel: 2                                      Non-sentinel: 0                                      Total: 2       Lymph Nodes with Metastasis: 0  Pathologic Stage Classification (pTNM, AJCC 8th Edition): pT1a, pN0  Ancillary Studies: MMR and MSI testing have been ordered.  Representative Tumor Block: C3-C9.  Comment(s): Cytokeratin AE1/AE3 performed on the sentinel lymph nodes  (parts A and B) is negative for metastatic carcinoma.  (v4.2.0.1)    03/20/2021 Initial Diagnosis   Endometrial adenocarcinoma (H[facility])     Interval History: Doing well, continues on hormone replacement therapy.  Has some episodes where she can feel she is in A. fib, overall much less since restarting estrogen. Had minimal vaginal spotting once after intercourse, otherwise denies vaginal bleeding or discharge since her last visit with me. Reports regular bowel and bladder function.  Denies any significant change in weight.  Denies abdominal or pelvic pain.  Past Medical/Surgical  History: Past Medical History:  Diagnosis Date   Atrial fibrillation ([place])    Dysrhythmia    A-fib   Endometrial hyperplasia 01/03/2015   GERD (gastroesophageal reflux disease)    History of kidney stones    Hyperlipidemia    Hypertension    Pneumonia    PONV (postoperative nausea and vomiting)    slow to wake up   Thickened endometrium 01/03/2015   Vaginal delivery 1976, 1985    Past Surgical History:  Procedure Laterality Date   ANKLE FRACTURE SURGERY     CATARACT EXTRACTION Right 2020   DILATION AND CURETTAGE OF UTERUS  2012   ablation    EYE SURGERY     Corneal growth removal on right eye   HYSTEROSCOPY WITH D & C N/A 01/03/2015   Procedure: DILATATION AND CURETTAGE /HYSTEROSCOPY;  Surgeon: [name], MD;  Location: [facility];  Service:  Gynecology;  Laterality: N/A;   LAPAROSCOPY     for endometriosis   ROBOTIC ASSISTED TOTAL HYSTERECTOMY WITH BILATERAL SALPINGO OOPHERECTOMY N/A 03/17/2021   Procedure: XI ROBOTIC ASSISTED TOTAL HYSTERECTOMY WITH BILATERAL SALPINGO OOPHORECTOMY;  Surgeon: [name], MD;  Location: [facility];  Service: Gynecology;  Laterality: N/A;   SENTINEL NODE BIOPSY N/A 03/17/2021   Procedure: SENTINEL NODE BIOPSY;  Surgeon: [name], MD;  Location: [facility];  Service: Gynecology;  Laterality: N/A;   TONSILLECTOMY AND ADENOIDECTOMY      Family History  Problem Relation Age of Onset   Heart disease Mother 50       CABG   Stroke Father    Prostate cancer Father    Heart disease Brother 67       Transplant, died 63 years   Heart disease Maternal Grandmother    Heart disease Paternal Grandmother    Cancer Paternal Grandfather    Heart disease Brother 72       RF   Leukemia Brother    Leukemia Brother    Hypertension Sister    Cervical cancer Sister    Colon cancer Neg Hx    Breast cancer Neg Hx    Ovarian cancer Neg Hx    Endometrial cancer Neg Hx    Pancreatic cancer Neg Hx     Social History   Socioeconomic History   Marital status: Widowed    Spouse name: [name]   Number of children: 2   Years of education: 14   Highest education level: Not on file  Occupational History   Occupation: retired    Comment: [employer]  Tobacco Use   Smoking status: Never   Smokeless tobacco: Never  Vaping Use   Vaping Use: Never used  Substance and Sexual Activity   Alcohol use: No    Alcohol/week: 0.0 standard drinks   Drug use: No   Sexual activity: Yes    Birth control/protection: Post-menopausal  Other Topics Concern   Not on file  Social History Narrative   Lives with her younger daughter, [name]. Her older daughter lives in [place] with her family.   Social Determinants of Health   Financial Resource Strain: Not on file  Food Insecurity: Not on file  Transportation  Needs: Not on file  Physical Activity: Not on file  Stress: Not on file  Social Connections: Not on file    Current Medications:  Current Outpatient Medications:    apixaban (ELIQUIS) 5 MG TABS tablet, Take 1 tablet (5 mg total) by mouth 2 (two) times daily., Disp: 180 tablet, Rfl: 1  cholecalciferol (VITAMIN D) 1000 UNITS tablet, Take 1,000 Units by mouth every evening. , Disp: , Rfl:    estradiol (VIVELLE-DOT) 0.025 MG/24HR, Place 1 patch onto the skin 2 (two) times a week., Disp: 8 patch, Rfl: 3   metoprolol tartrate (LOPRESSOR) 50 MG tablet, TAKE ONE TABLET BY MOUTH TWICE A DAY, Disp: 180 tablet, Rfl: 1   olmesartan (BENICAR) 40 MG tablet, Take 40 mg by mouth at bedtime., Disp: , Rfl:    Polyethyl Glycol-Propyl Glycol (SYSTANE OP), Place 1 drop into both eyes 2 (two) times daily as needed (dry eyes)., Disp: , Rfl:    rosuvastatin (CRESTOR) 10 MG tablet, Take 10 mg by mouth every Monday, Wednesday, and Friday., Disp: , Rfl:   Review of Systems: Pertinent positives as per HPI. Denies appetite changes, fevers, chills, fatigue, unexplained weight changes. Denies hearing loss, neck lumps or masses, mouth sores, ringing in ears or voice changes. Denies cough or wheezing.  Denies shortness of breath. Denies chest pain or palpitations. Denies leg swelling. Denies abdominal distention, pain, blood in stools, constipation, diarrhea, nausea, vomiting, or early satiety. Denies pain with intercourse, dysuria, frequency, hematuria or incontinence. Denies hot flashes, pelvic pain, or vaginal discharge.   Denies joint pain, back pain or muscle pain/cramps. Denies itching, rash, or wounds. Denies dizziness, headaches, numbness or seizures. Denies swollen lymph nodes or glands, denies easy bruising or bleeding. Denies anxiety, depression, confusion, or decreased concentration.  Physical Exam: BP (!) 157/82 (BP Location: Left Arm, Patient Position: Sitting)   Pulse 70   Temp 97.7 F (36.5 C)  (Tympanic)   Resp 16   Ht $R'5\' 3"'pc$  (1.6 m)   Wt 158 lb 12.8 oz (72 kg)   SpO2 100%   BMI 28.13 kg/m  General: Alert, oriented, no acute distress. HEENT: Normocephalic, atraumatic, sclera anicteric. Chest: Clear to auscultation bilaterally.  No wheezes or rhonchi. Cardiovascular: Regular rate and rhythm, no murmurs. Abdomen: soft, nontender.  Normoactive bowel sounds.  No masses or hepatosplenomegaly appreciated.  Well-healed incisions. Extremities: Grossly normal range of motion.  Warm, well perfused.  No edema bilaterally. Skin: No rashes or lesions noted. Lymphatics: No cervical, supraclavicular, or inguinal adenopathy. GU: Normal appearing external genitalia without erythema, excoriation, or lesions.  Speculum exam reveals what appears to be a small caruncle.  Mildly atrophic vaginal mucosa, no lesions or masses seen, no discharge or bleeding.  Bimanual exam reveals cuff intact no nodularity or masses.  Rectovaginal exam confirms these findings.  Laboratory & Radiologic Studies: None new  Assessment & Plan: Mary Potter is a 75 y.o. woman  with Stage IA grade 1 endometrioid endometrial adenocarcinoma.   Patient is overall doing quite well and is NED on exam today.  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  Discussed what this is and that it does not need treatment unless it were to become symptomatic (like causing vaginal bleeding).  Patient has continued on her estrogen repletion and has had significant improvement in her cardiac symptoms.  She feels comfortable at this time continuing on estrogen although has some concerns given her estrogen dependent cancer.  She and I have previously discussed the limited data that supports no change or increased risk from an oncologic standpoint in patients with early stage low risk uterine cancer being on estrogen replacement.   We discussed signs and symptoms that be concerning for disease recurrence.  Patient understands that given her  low risk, early stage disease, no adjuvant therapy is recommended.  We discussed NCCN and  SGO surveillance recommendations, which differ some after the first year.  We will plan on visits every 6 months for a year and then revisit whether the patient would feel more comfortable with visits every 6 months or transitioning to yearly visits.  32 minutes of total time was spent for this patient encounter, including preparation, face-to-face counseling with the patient and coordination of care, and documentation of the encounter.  Jeral Pinch, MD  Division of Gynecologic Oncology  Department of Obstetrics and Gynecology  [facility]

## 2021-10-09 ENCOUNTER — Inpatient Hospital Stay: Payer: Medicare Other | Attending: Gynecologic Oncology | Admitting: Gynecologic Oncology

## 2021-10-09 ENCOUNTER — Other Ambulatory Visit: Payer: Self-pay

## 2021-10-09 VITALS — BP 157/82 | HR 70 | Temp 97.7°F | Resp 16 | Ht 63.0 in | Wt 158.8 lb

## 2021-10-09 DIAGNOSIS — Z7901 Long term (current) use of anticoagulants: Secondary | ICD-10-CM | POA: Insufficient documentation

## 2021-10-09 DIAGNOSIS — Z79890 Hormone replacement therapy (postmenopausal): Secondary | ICD-10-CM | POA: Diagnosis not present

## 2021-10-09 DIAGNOSIS — Z90722 Acquired absence of ovaries, bilateral: Secondary | ICD-10-CM | POA: Insufficient documentation

## 2021-10-09 DIAGNOSIS — I4891 Unspecified atrial fibrillation: Secondary | ICD-10-CM | POA: Diagnosis not present

## 2021-10-09 DIAGNOSIS — N362 Urethral caruncle: Secondary | ICD-10-CM | POA: Diagnosis not present

## 2021-10-09 DIAGNOSIS — C541 Malignant neoplasm of endometrium: Secondary | ICD-10-CM | POA: Diagnosis not present

## 2021-10-09 DIAGNOSIS — Z90710 Acquired absence of both cervix and uterus: Secondary | ICD-10-CM | POA: Diagnosis not present

## 2021-10-09 DIAGNOSIS — K219 Gastro-esophageal reflux disease without esophagitis: Secondary | ICD-10-CM | POA: Diagnosis not present

## 2021-10-09 DIAGNOSIS — Z79899 Other long term (current) drug therapy: Secondary | ICD-10-CM | POA: Diagnosis not present

## 2021-10-09 DIAGNOSIS — E785 Hyperlipidemia, unspecified: Secondary | ICD-10-CM | POA: Diagnosis not present

## 2021-10-09 DIAGNOSIS — I10 Essential (primary) hypertension: Secondary | ICD-10-CM | POA: Insufficient documentation

## 2021-10-09 NOTE — Patient Instructions (Signed)
It was a pleasure seeing you today!  I do not see or feel any evidence of cancer recurrence on your exam.  I will see you for follow-up in 6 months.  If you develop any of the symptoms we discussed today that would be concerning for cancer recurrence, please call to see me sooner.

## 2021-10-11 NOTE — Progress Notes (Signed)
Cardiology Office Note   Date:  10/12/2021   ID:  Mary Potter, Mary Potter 17-Mar-1946, MRN [id number]  PCP:  Mary Hatchet, MD  Cardiologist:   Minus Breeding, MD   Chief Complaint  Patient presents with   Palpitations       History of Present Illness: Mary Potter is a 75 y.o. female who was referred by Mary Hatchet, MD for evaluation of an abnormal EKG and HTN.  She had chest pain and had a normal echo and negatie POET (Plain Old Exercise Treadmill).  She did have some mild calcium on CT.  She has PAF.    She comes in today because she was in fibrillation over the weekend.  It started Saturday in the morning and lasted all day and finally went away last night Sunday at midnight.  She feels it when she is in fibrillation.  It may not be particularly fast but she does not feel well.  She feels weaker.  She might feel it racing.  She knows when it broke.  I confirmed the fibrillation on her home device.  She otherwise has had no chest pressure, neck or arm discomfort.  She has had no weight gain or edema.   Past Medical History:  Diagnosis Date   Atrial fibrillation ([place])    Dysrhythmia    A-fib   Endometrial hyperplasia 01/03/2015   GERD (gastroesophageal reflux disease)    History of kidney stones    Hyperlipidemia    Hypertension    Pneumonia    PONV (postoperative nausea and vomiting)    slow to wake up   Thickened endometrium 01/03/2015   Vaginal delivery 1976, 1985    Past Surgical History:  Procedure Laterality Date   ANKLE FRACTURE SURGERY     CATARACT EXTRACTION Right 2020   DILATION AND CURETTAGE OF UTERUS  2012   ablation    EYE SURGERY     Corneal growth removal on right eye   HYSTEROSCOPY WITH D & C N/A 01/03/2015   Procedure: DILATATION AND CURETTAGE /HYSTEROSCOPY;  Surgeon: Mary Fallen, MD;  Location: [facility];  Service: Gynecology;  Laterality: N/A;   LAPAROSCOPY     for endometriosis   ROBOTIC ASSISTED TOTAL HYSTERECTOMY WITH BILATERAL  SALPINGO OOPHERECTOMY N/A 03/17/2021   Procedure: XI ROBOTIC ASSISTED TOTAL HYSTERECTOMY WITH BILATERAL SALPINGO OOPHORECTOMY;  Surgeon: Mary Mosses, MD;  Location: [facility];  Service: Gynecology;  Laterality: N/A;   SENTINEL NODE BIOPSY N/A 03/17/2021   Procedure: SENTINEL NODE BIOPSY;  Surgeon: Mary Mosses, MD;  Location: [facility];  Service: Gynecology;  Laterality: N/A;   TONSILLECTOMY AND ADENOIDECTOMY       Current Outpatient Medications  Medication Sig Dispense Refill   ALPRAZolam (XANAX) 0.5 MG tablet Take 0.25-0.5 mg by mouth 2 (two) times daily as needed.     apixaban (ELIQUIS) 5 MG TABS tablet Take 1 tablet (5 mg total) by mouth 2 (two) times daily. 180 tablet 1   cholecalciferol (VITAMIN D) 1000 UNITS tablet Take 1,000 Units by mouth every evening.      estradiol (VIVELLE-DOT) 0.025 MG/24HR Place 1 patch onto the skin 2 (two) times a week. 8 patch 3   metoprolol tartrate (LOPRESSOR) 50 MG tablet TAKE ONE TABLET BY MOUTH TWICE A DAY 180 tablet 1   olmesartan (BENICAR) 40 MG tablet Take 40 mg by mouth at bedtime.     Polyethyl Glycol-Propyl Glycol (SYSTANE OP) Place 1 drop into both eyes 2 (two) times daily  as needed (dry eyes).     rosuvastatin (CRESTOR) 10 MG tablet Take 10 mg by mouth every Monday, Wednesday, and Friday.     No current facility-administered medications for this visit.    Allergies:   Talwin [pentazocine]    ROS:  Please see the history of present illness.   Otherwise, review of systems are positive for none.   All other systems are reviewed and negative.    PHYSICAL EXAM: VS:  BP 135/76   Pulse 76   Ht 5' 3.05" (1.601 m)   Wt 158 lb 9.6 oz (71.9 kg)   SpO2 99%   BMI 28.05 kg/m  , BMI Body mass index is 28.05 kg/m. GENERAL:  Well appearing NECK:  No jugular venous distention, waveform within normal limits, carotid upstroke brisk and symmetric, no bruits, no thyromegaly LUNGS:  Clear to auscultation bilaterally CHEST:  Unremarkable HEART:   PMI not displaced or sustained,S1 and S2 within normal limits, no S3, no S4, no clicks, no rubs, no murmurs ABD:  Flat, positive bowel sounds normal in frequency in pitch, no bruits, no rebound, no guarding, no midline pulsatile mass, no hepatomegaly, no splenomegaly EXT:  2 plus pulses throughout, no edema, no cyanosis no clubbing   EKG:  EKG is   ordered today. The ekg ordered today demonstrates sinus rhythm, rate 76, left ventricular hypertrophy by voltage criteria, left axis deviation, no acute ST-T wave changes.   Recent Labs: 03/11/2021: ALT 16; BUN 20; Creatinine, Ser 0.51; Hemoglobin 13.6; Platelets 262; Potassium 3.8; Sodium 139    Lipid Panel No results found for: CHOL, TRIG, HDL, CHOLHDL, VLDL, LDLCALC, LDLDIRECT    Wt Readings from Last 3 Encounters:  10/12/21 158 lb 9.6 oz (71.9 kg)  10/09/21 158 lb 12.8 oz (72 kg)  06/09/21 156 lb (70.8 kg)      Other studies Reviewed: Additional studies/ records that were reviewed today include:  None.   ASSESSMENT AND PLAN:  HTN:  The blood pressure is at target. No change in medications is indicated. We will continue with therapeutic lifestyle changes (TLC).  ATRIAL FIB:   Ms. Mary Potter has a CHA2DS2 - VASc score of 2.   She remains on anticoagulation.  We had a long conversation about this and I did speak with Dr. Quentin Potter.  I think she would be a reasonable candidate to consider ablation and she would like to talk to EP about this.    Current medicines are reviewed at length with the patient today.  The patient does not have concerns regarding medicines.  The following changes have been made:  no change  Labs/ tests ordered today include: None  Orders Placed This Encounter  Procedures   EKG 12-Lead      Disposition:   FU with 12 months  Signed, Minus Breeding, MD  10/12/2021 11:33 AM    [place]

## 2021-10-12 ENCOUNTER — Other Ambulatory Visit: Payer: Self-pay

## 2021-10-12 ENCOUNTER — Encounter: Payer: Self-pay | Admitting: Cardiology

## 2021-10-12 ENCOUNTER — Ambulatory Visit (INDEPENDENT_AMBULATORY_CARE_PROVIDER_SITE_OTHER): Payer: Medicare Other | Admitting: Cardiology

## 2021-10-12 VITALS — BP 135/76 | HR 76 | Ht 63.05 in | Wt 158.6 lb

## 2021-10-12 DIAGNOSIS — I480 Paroxysmal atrial fibrillation: Secondary | ICD-10-CM | POA: Diagnosis not present

## 2021-10-12 DIAGNOSIS — I10 Essential (primary) hypertension: Secondary | ICD-10-CM

## 2021-10-12 DIAGNOSIS — R931 Abnormal findings on diagnostic imaging of heart and coronary circulation: Secondary | ICD-10-CM | POA: Diagnosis not present

## 2021-10-12 NOTE — Patient Instructions (Signed)
Medication Instructions:  Your Physician recommend you continue on your current medication as directed.    *If you need a refill on your cardiac medications before your next appointment, please call your pharmacy*   Follow-Up: At [facility], you and your health needs are our priority.  As part of our continuing mission to provide you with exceptional heart care, we have created designated Provider Care Teams.  These Care Teams include your primary Cardiologist (physician) and Advanced Practice Providers (APPs -  Physician Assistants and Nurse Practitioners) who all work together to provide you with the care you need, when you need it.  We recommend signing up for the patient portal called "MyChart".  Sign up information is provided on this After Visit Summary.  MyChart is used to connect with patients for Virtual Visits (Telemedicine).  Patients are able to view lab/test results, encounter notes, upcoming appointments, etc.  Non-urgent messages can be sent to your provider as well.   To learn more about what you can do with MyChart, go to [email or web address].    Your next appointment:    With Dr. Quentin Ore  The format for your next appointment:   In Person  Provider:   Dr. Quentin Ore for electrophysiology. Make appointment with Dr. Percival Spanish after seeing Dr. Quentin Ore

## 2021-11-03 ENCOUNTER — Telehealth: Payer: Self-pay | Admitting: Cardiology

## 2021-11-03 MED ORDER — METOPROLOL TARTRATE 50 MG PO TABS: 50.0000 mg | ORAL_TABLET | Freq: Two times a day (BID) | ORAL | 1 refills | Status: DC

## 2021-11-03 NOTE — Telephone Encounter (Signed)
°*  STAT* If patient is at the pharmacy, call can be transferred to refill team.   1. Which medications need to be refilled? (please list name of each medication and dose if known) apixaban (ELIQUIS) 5 MG TABS tablet; metoprolol tartrate (LOPRESSOR) 50 MG tablet  2. Which pharmacy/location (including street and city if local pharmacy) is medication to be sent to?[facility] - [place], [address]  3. Do they need a 30 day or 90 day supply? [place]

## 2021-11-12 ENCOUNTER — Other Ambulatory Visit (HOSPITAL_COMMUNITY): Payer: Self-pay | Admitting: Cardiology

## 2021-11-13 NOTE — Telephone Encounter (Signed)
Prescription refill request for Eliquis received. Indication:Afib Last office visit:12/22 Scr:0.5 Age: 76 Weight:71.9 kg  Prescription refilled

## 2021-11-16 ENCOUNTER — Telehealth: Payer: Self-pay | Admitting: Cardiology

## 2021-11-16 MED ORDER — APIXABAN 5 MG PO TABS: 5.0000 mg | ORAL_TABLET | Freq: Two times a day (BID) | ORAL | 3 refills | Status: DC

## 2021-11-16 NOTE — Telephone Encounter (Signed)
°*  STAT* If patient is at the pharmacy, call can be transferred to refill team.   1. Which medications need to be refilled? (please list name of each medication and dose if known)  apixaban (ELIQUIS) 5 MG TABS tablet  2. Which pharmacy/location (including street and city if local pharmacy) is medication to be sent to? [facility] - [place], [place]          Phone:  [phone]     Fax:  [phone]  3. Do they need a 30 day or 90 day supply? 90 ds

## 2021-11-16 NOTE — Telephone Encounter (Signed)
Refill sent to the pharmacy electronically.  

## 2021-11-26 NOTE — Progress Notes (Signed)
Electrophysiology Office Note:    Date:  11/27/2021   ID:  Mary Potter, DOB Sep 15, 1946, MRN [id number]  PCP:  Velna Hatchet, MD  [facility] Cardiologist:  Minus Breeding, MD  [facility] Electrophysiologist:  Vickie Epley, MD   Referring MD: Velna Hatchet, MD   Chief Complaint: Paroxysmal atrial fibrillation  History of Present Illness:    Mary Potter is a 76 y.o. female who presents for an evaluation of paroxysmal atrial fibrillation at the request of Dr. Percival Spanish. Their medical history includes hypertension, hyperlipidemia, GERD, atrial fibrillation.  The patient was seen by Dr. Percival Spanish October 12, 2021 because of an abnormal EKG.  At the time of his appointment with her, she has been due to atrial fibrillation for several days.  She apparently feels weak with palpitations when she is out of rhythm.  Dr. Percival Spanish confirmed atrial fibrillation from a home monitoring device. She takes Eliquis for stroke prophylaxis  She is with her husband today in clinic who is recording the visit.  The patient tells me that she is weak in her arms and legs and short of breath when she is in atrial fibrillation.  She has had an episode of A. fib 14 times so far in January.  She is interested in a rhythm control strategy.     Past Medical History:  Diagnosis Date   Atrial fibrillation ([place])    Dysrhythmia    A-fib   Endometrial hyperplasia 01/03/2015   GERD (gastroesophageal reflux disease)    History of kidney stones    Hyperlipidemia    Hypertension    Pneumonia    PONV (postoperative nausea and vomiting)    slow to wake up   Thickened endometrium 01/03/2015   Vaginal delivery 1976, 1985    Past Surgical History:  Procedure Laterality Date   ANKLE FRACTURE SURGERY     CATARACT EXTRACTION Right 2020   DILATION AND CURETTAGE OF UTERUS  2012   ablation    EYE SURGERY     Corneal growth removal on right eye   HYSTEROSCOPY WITH D & C N/A 01/03/2015   Procedure:  DILATATION AND CURETTAGE /HYSTEROSCOPY;  Surgeon: [name], MD;  Location: [facility];  Service: Gynecology;  Laterality: N/A;   LAPAROSCOPY     for endometriosis   ROBOTIC ASSISTED TOTAL HYSTERECTOMY WITH BILATERAL SALPINGO OOPHERECTOMY N/A 03/17/2021   Procedure: XI ROBOTIC ASSISTED TOTAL HYSTERECTOMY WITH BILATERAL SALPINGO OOPHORECTOMY;  Surgeon: [name], MD;  Location: [facility];  Service: Gynecology;  Laterality: N/A;   SENTINEL NODE BIOPSY N/A 03/17/2021   Procedure: SENTINEL NODE BIOPSY;  Surgeon: [name], MD;  Location: [facility];  Service: Gynecology;  Laterality: N/A;   TONSILLECTOMY AND ADENOIDECTOMY      Current Medications: Current Meds  Medication Sig   ALPRAZolam (XANAX) 0.5 MG tablet Take 0.25-0.5 mg by mouth 2 (two) times daily as needed.   apixaban (ELIQUIS) 5 MG TABS tablet Take 1 tablet (5 mg total) by mouth 2 (two) times daily.   cholecalciferol (VITAMIN D) 1000 UNITS tablet Take 1,000 Units by mouth every evening.    estradiol (VIVELLE-DOT) 0.025 MG/24HR Place 1 patch onto the skin 2 (two) times a week.   metoprolol tartrate (LOPRESSOR) 50 MG tablet Take 1 tablet (50 mg total) by mouth 2 (two) times daily.   olmesartan (BENICAR) 40 MG tablet Take 40 mg by mouth at bedtime.   Polyethyl Glycol-Propyl Glycol (SYSTANE OP) Place 1 drop into both eyes 2 (two)  times daily as needed (dry eyes).   rosuvastatin (CRESTOR) 10 MG tablet Take 10 mg by mouth every Monday, Wednesday, and Friday.     Allergies:   Talwin [pentazocine]   Social History   Socioeconomic History   Marital status: Widowed    Spouse name: [name]   Number of children: 2   Years of education: 14   Highest education level: Not on file  Occupational History   Occupation: retired    Comment: [employer]  Tobacco Use   Smoking status: Never   Smokeless tobacco: Never  Vaping Use   Vaping Use: Never used  Substance and Sexual Activity   Alcohol use: No    Alcohol/week: 0.0 standard drinks    Drug use: No   Sexual activity: Yes    Birth control/protection: Post-menopausal  Other Topics Concern   Not on file  Social History Narrative   Lives with her younger daughter, [name]. Her older daughter lives in [place] with her family.   Social Determinants of Health   Financial Resource Strain: Not on file  Food Insecurity: Not on file  Transportation Needs: Not on file  Physical Activity: Not on file  Stress: Not on file  Social Connections: Not on file     Family History: The patient's family history includes Cancer in her paternal grandfather; Cervical cancer in her sister; Heart disease in her maternal grandmother and paternal grandmother; Heart disease (age of onset: 25) in her brother; Heart disease (age of onset: 67) in her brother; Heart disease (age of onset: 54) in her mother; Hypertension in her sister; Leukemia in her brother and brother; Prostate cancer in her father; Stroke in her father. There is no history of Colon cancer, Breast cancer, Ovarian cancer, Endometrial cancer, or Pancreatic cancer.  ROS:   Please see the history of present illness.    All other systems reviewed and are negative.  EKGs/Labs/Other Studies Reviewed:    The following studies were reviewed today:  June 2019 echo Left ventricular function normal Mild MR  EKG:  The ekg ordered today demonstrates sinus rhythm with frequent PACs.  Sinus arrhythmia.   Recent Labs: 03/11/2021: ALT 16; BUN 20; Creatinine, Ser 0.51; Hemoglobin 13.6; Platelets 262; Potassium 3.8; Sodium 139  Recent Lipid Panel No results found for: CHOL, TRIG, HDL, CHOLHDL, VLDL, LDLCALC, LDLDIRECT  Physical Exam:    VS:  BP 126/68    Pulse 73    Ht 5' 3.5" (1.613 m)    Wt 158 lb 3.2 oz (71.8 kg)    SpO2 99%    BMI 27.58 kg/m     Wt Readings from Last 3 Encounters:  11/27/21 158 lb 3.2 oz (71.8 kg)  10/12/21 158 lb 9.6 oz (71.9 kg)  10/09/21 158 lb 12.8 oz (72 kg)     GEN:  Well nourished, well developed  in no acute distress.  Appears younger than stated age. HEENT: Normal NECK: No JVD; No carotid bruits LYMPHATICS: No lymphadenopathy CARDIAC: RRR, no murmurs, rubs, gallops RESPIRATORY:  Clear to auscultation without rales, wheezing or rhonchi  ABDOMEN: Soft, non-tender, non-distended MUSCULOSKELETAL:  No edema; No deformity  SKIN: Warm and dry NEUROLOGIC:  Alert and oriented x 3 PSYCHIATRIC:  Normal affect       ASSESSMENT:    1. PAF (paroxysmal atrial fibrillation) ([place])   2. Essential hypertension    PLAN:    In order of problems listed above:  #Paroxysmal atrial fibrillation Symptomatic.  She is on Eliquis for stroke stroke  prophylaxis.  She is interested in a rhythm control strategy.  I briefly discussed antiarrhythmic drugs during today's visit and discussed catheter ablation in detail.  I discussed the risks and benefits of catheter ablation and the chance that we would need a repeat ablation or antiarrhythmic drugs in the future.  She wishes to proceed with scheduling.  We will need a CT scan and echocardiogram prior to the ablation procedure.  Risk, benefits, and alternatives to EP study and radiofrequency ablation for afib were also discussed in detail today. These risks include but are not limited to stroke, bleeding, vascular damage, tamponade, perforation, damage to the esophagus, lungs, and other structures, pulmonary vein stenosis, worsening renal function, and death. The patient understands these risk and wishes to proceed.  We will therefore proceed with catheter ablation at the next available time.  Carto, ICE, anesthesia are requested for the procedure.  Will also obtain CT PV protocol prior to the procedure to exclude LAA thrombus and further evaluate atrial anatomy.  #Hypertension Controlled.  Continue current medications.      Total time spent with patient today 60 minutes. This includes reviewing records, evaluating the patient and coordinating  care.  Medication Adjustments/Labs and Tests Ordered: Current medicines are reviewed at length with the patient today.  Concerns regarding medicines are outlined above.  No orders of the defined types were placed in this encounter.  No orders of the defined types were placed in this encounter.    Signed, Hilton Cork. Quentin Ore, MD, [facility], [facility] 11/27/2021 9:44 AM    Electrophysiology [facility]

## 2021-11-27 ENCOUNTER — Encounter: Payer: Self-pay | Admitting: *Deleted

## 2021-11-27 ENCOUNTER — Ambulatory Visit (INDEPENDENT_AMBULATORY_CARE_PROVIDER_SITE_OTHER): Payer: Medicare Other | Admitting: Cardiology

## 2021-11-27 ENCOUNTER — Encounter: Payer: Self-pay | Admitting: Cardiology

## 2021-11-27 ENCOUNTER — Other Ambulatory Visit: Payer: Self-pay

## 2021-11-27 VITALS — BP 126/68 | HR 73 | Ht 63.5 in | Wt 158.2 lb

## 2021-11-27 DIAGNOSIS — I4891 Unspecified atrial fibrillation: Secondary | ICD-10-CM | POA: Diagnosis not present

## 2021-11-27 DIAGNOSIS — I10 Essential (primary) hypertension: Secondary | ICD-10-CM

## 2021-11-27 DIAGNOSIS — I480 Paroxysmal atrial fibrillation: Secondary | ICD-10-CM | POA: Diagnosis not present

## 2021-11-27 NOTE — Patient Instructions (Addendum)
Medication Instructions:  Your physician recommends that you continue on your current medications as directed. Please refer to the Current Medication list given to you today. *If you need a refill on your cardiac medications before your next appointment, please call your pharmacy*  Lab Work: None. If you have labs (blood work) drawn today and your tests are completely normal, you will receive your results only by: [place] (if you have MyChart) OR A paper copy in the mail If you have any lab test that is abnormal or we need to change your treatment, we will call you to review the results.  Testing/Procedures: Your physician has requested that you have cardiac CT. Cardiac computed tomography (CT) is a painless test that uses an x-ray machine to take clear, detailed pictures of your heart. For further information please visit [email or web address]. Please follow instruction sheet as given.   Your physician has recommended that you have an ablation. Catheter ablation is a medical procedure used to treat some cardiac arrhythmias (irregular heartbeats). During catheter ablation, a long, thin, flexible tube is put into a blood vessel in your groin (upper thigh), or neck. This tube is called an ablation catheter. It is then guided to your heart through the blood vessel. Radio frequency waves destroy small areas of heart tissue where abnormal heartbeats may cause an arrhythmia to start. Please see the instruction sheet given to you today.   Follow-Up: At [facility], you and your health needs are our priority.  As part of our continuing mission to provide you with exceptional heart care, we have created designated Provider Care Teams.  These Care Teams include your primary Cardiologist (physician) and Advanced Practice Providers (APPs -  Physician Assistants and Nurse Practitioners) who all work together to provide you with the care you need, when you need it.  Your physician wants you to  follow-up in: see instruction letter.   We recommend signing up for the patient portal called "MyChart".  Sign up information is provided on this After Visit Summary.  MyChart is used to connect with patients for Virtual Visits (Telemedicine).  Patients are able to view lab/test results, encounter notes, upcoming appointments, etc.  Non-urgent messages can be sent to your provider as well.   To learn more about what you can do with MyChart, go to [email or web address].    Any Other Special Instructions Will Be Listed Below (If Applicable).  Cardiac Ablation Cardiac ablation is a procedure to destroy (ablate) some heart tissue that is sending bad signals. These bad signals cause problems in heart rhythm. The heart has many areas that make these signals. If there are problems in these areas, they can make the heart beat in a way that is not normal. Destroying some tissues can help make the heart rhythm normal. Tell your doctor about: Any allergies you have. All medicines you are taking. These include vitamins, herbs, eye drops, creams, and over-the-counter medicines. Any problems you or family members have had with medicines that make you fall asleep (anesthetics). Any blood disorders you have. Any surgeries you have had. Any medical conditions you have, such as kidney failure. Whether you are pregnant or may be pregnant. What are the risks? This is a safe procedure. But problems may occur, including: Infection. Bruising and bleeding. Bleeding into the chest. Stroke or blood clots. Damage to nearby areas of your body. Allergies to medicines or dyes. The need for a pacemaker if the normal system is damaged. Failure of the procedure to treat the problem.  What happens before the procedure? Medicines Ask your doctor about: Changing or stopping your normal medicines. This is important. Taking aspirin and ibuprofen. Do not take these medicines unless your doctor tells you to take  them. Taking other medicines, vitamins, herbs, and supplements. General instructions Follow instructions from your doctor about what you cannot eat or drink. Plan to have someone take you home from the hospital or clinic. If you will be going home right after the procedure, plan to have someone with you for 24 hours. Ask your doctor what steps will be taken to prevent infection. What happens during the procedure?  An IV tube will be put into one of your veins. You will be given a medicine to help you relax. The skin on your neck or groin will be numbed. A cut (incision) will be made in your neck or groin. A needle will be put through your cut and into a large vein. A tube (catheter) will be put into the needle. The tube will be moved to your heart. Dye may be put through the tube. This helps your doctor see your heart. Small devices (electrodes) on the tube will send out signals. A type of energy will be used to destroy some heart tissue. The tube will be taken out. Pressure will be held on your cut. This helps stop bleeding. A bandage will be put over your cut. The exact procedure may vary among doctors and hospitals. What happens after the procedure? You will be watched until you leave the hospital or clinic. This includes checking your heart rate, breathing rate, oxygen, and blood pressure. Your cut will be watched for bleeding. You will need to lie still for a few hours. Do not drive for 24 hours or as long as your doctor tells you. Summary Cardiac ablation is a procedure to destroy some heart tissue. This is done to treat heart rhythm problems. Tell your doctor about any medical conditions you may have. Tell him or her about all medicines you are taking to treat them. This is a safe procedure. But problems may occur. These include infection, bruising, bleeding, and damage to nearby areas of your body. Follow what your doctor tells you about food and drink. You may also be told to  change or stop some of your medicines. After the procedure, do not drive for 24 hours or as long as your doctor tells you. This information is not intended to replace advice given to you by your health care provider. Make sure you discuss any questions you have with your health care provider. Document Revised: 09/20/2019 Document Reviewed: 09/20/2019 Elsevier Patient Education  2022 [employer].

## 2021-12-30 DIAGNOSIS — Z9889 Other specified postprocedural states: Secondary | ICD-10-CM | POA: Diagnosis not present

## 2021-12-30 DIAGNOSIS — H16223 Keratoconjunctivitis sicca, not specified as Sjogren's, bilateral: Secondary | ICD-10-CM | POA: Diagnosis not present

## 2021-12-30 DIAGNOSIS — H25812 Combined forms of age-related cataract, left eye: Secondary | ICD-10-CM | POA: Diagnosis not present

## 2021-12-30 DIAGNOSIS — H18452 Nodular corneal degeneration, left eye: Secondary | ICD-10-CM | POA: Diagnosis not present

## 2021-12-30 DIAGNOSIS — Z961 Presence of intraocular lens: Secondary | ICD-10-CM | POA: Diagnosis not present

## 2022-01-12 ENCOUNTER — Other Ambulatory Visit: Payer: Self-pay

## 2022-01-12 ENCOUNTER — Other Ambulatory Visit: Payer: Medicare Other

## 2022-01-12 DIAGNOSIS — I4891 Unspecified atrial fibrillation: Secondary | ICD-10-CM

## 2022-01-12 DIAGNOSIS — I10 Essential (primary) hypertension: Secondary | ICD-10-CM | POA: Diagnosis not present

## 2022-01-12 DIAGNOSIS — I480 Paroxysmal atrial fibrillation: Secondary | ICD-10-CM

## 2022-01-12 LAB — BASIC METABOLIC PANEL
BUN/Creatinine Ratio: 24 (ref 12–28)
BUN: 18 mg/dL (ref 8–27)
CO2: 25 mmol/L (ref 20–29)
Calcium: 9.2 mg/dL (ref 8.7–10.3)
Chloride: 102 mmol/L (ref 96–106)
Creatinine, Ser: 0.76 mg/dL (ref 0.57–1.00)
Glucose: 102 mg/dL — ABNORMAL HIGH (ref 70–99)
Potassium: 4.6 mmol/L (ref 3.5–5.2)
Sodium: 140 mmol/L (ref 134–144)
eGFR: 82 mL/min/{1.73_m2} (ref >59)

## 2022-01-12 LAB — CBC WITH DIFFERENTIAL/PLATELET
Basophils Absolute: 0 10*3/uL (ref 0.0–0.2)
Basos: 1 %
EOS (ABSOLUTE): 0.2 10*3/uL (ref 0.0–0.4)
Eos: 3 %
Hematocrit: 38.2 % (ref 34.0–46.6)
Hemoglobin: 12.4 g/dL (ref 11.1–15.9)
Immature Grans (Abs): 0 10*3/uL (ref 0.0–0.1)
Immature Granulocytes: 0 %
Lymphocytes Absolute: 1.3 10*3/uL (ref 0.7–3.1)
Lymphs: 21 %
MCH: 28.4 pg (ref 26.6–33.0)
MCHC: 32.5 g/dL (ref 31.5–35.7)
MCV: 87 fL (ref 79–97)
Monocytes Absolute: 0.4 10*3/uL (ref 0.1–0.9)
Monocytes: 6 %
Neutrophils Absolute: 4.3 10*3/uL (ref 1.4–7.0)
Neutrophils: 69 %
Platelets: 266 10*3/uL (ref 150–450)
RBC: 4.37 x10E6/uL (ref 3.77–5.28)
RDW: 13.4 % (ref 11.7–15.4)
WBC: 6.3 10*3/uL (ref 3.4–10.8)

## 2022-01-28 DIAGNOSIS — H2512 Age-related nuclear cataract, left eye: Secondary | ICD-10-CM | POA: Diagnosis not present

## 2022-01-29 ENCOUNTER — Telehealth (HOSPITAL_COMMUNITY): Payer: Self-pay | Admitting: *Deleted

## 2022-01-29 DIAGNOSIS — H25812 Combined forms of age-related cataract, left eye: Secondary | ICD-10-CM | POA: Diagnosis not present

## 2022-01-29 NOTE — Telephone Encounter (Signed)
Attempted to call patient regarding upcoming cardiac CT appointment. °Left message on voicemail with name and callback number ° °Ailani Governale RN Navigator Cardiac Imaging °[facility] °[phone] Office °[phone] Cell ° °

## 2022-02-01 ENCOUNTER — Telehealth: Payer: Self-pay | Admitting: *Deleted

## 2022-02-01 ENCOUNTER — Other Ambulatory Visit: Payer: Self-pay | Admitting: Gynecologic Oncology

## 2022-02-01 ENCOUNTER — Encounter (HOSPITAL_COMMUNITY): Payer: Self-pay

## 2022-02-01 ENCOUNTER — Ambulatory Visit (HOSPITAL_COMMUNITY)
Admission: RE | Admit: 2022-02-01 | Discharge: 2022-02-01 | Disposition: A | Discharge status: Home / Self Care | Payer: Medicare Other | Source: Ambulatory Visit | Attending: Cardiology | Admitting: Cardiology

## 2022-02-01 DIAGNOSIS — I4891 Unspecified atrial fibrillation: Secondary | ICD-10-CM | POA: Insufficient documentation

## 2022-02-01 DIAGNOSIS — E2839 Other primary ovarian failure: Secondary | ICD-10-CM

## 2022-02-01 DIAGNOSIS — I480 Paroxysmal atrial fibrillation: Secondary | ICD-10-CM

## 2022-02-01 MED ORDER — ESTRADIOL 0.025 MG/24HR TD PTTW: 1.0000 | MEDICATED_PATCH | TRANSDERMAL | 1 refills | Status: DC

## 2022-02-01 MED ORDER — IOHEXOL 350 MG/ML SOLN
100.0000 mL | Freq: Once | INTRAVENOUS | Status: AC | PRN
  Administered 2022-02-01: 100 mL via INTRAVENOUS

## 2022-02-01 NOTE — Telephone Encounter (Signed)
Pt called in requesting a refill for a 90 day supply of her Estradiol 0.'025mg'$  patches.  She requested a 90 day supply because she get's her prescription from meds by mail. [name], NP notified.   ?

## 2022-02-01 NOTE — Progress Notes (Signed)
Pt requesting estrogen patches to be sent in as a 90 day supply. ?

## 2022-02-05 NOTE — Pre-Procedure Instructions (Signed)
Instructed patient on the following items: Arrival time 0830 Nothing to eat or drink after midnight No meds AM of procedure Responsible person to drive you home and stay with you for 24 hrs  Have you missed any doses of anti-coagulant Eliquis- hasn't missed any doses   

## 2022-02-08 ENCOUNTER — Ambulatory Visit (HOSPITAL_BASED_OUTPATIENT_CLINIC_OR_DEPARTMENT_OTHER): Payer: Medicare Other | Admitting: Certified Registered"

## 2022-02-08 ENCOUNTER — Encounter (HOSPITAL_COMMUNITY)
Admission: RE | Disposition: A | Discharge status: Home / Self Care | Payer: Self-pay | Source: Home / Self Care | Attending: Cardiology

## 2022-02-08 ENCOUNTER — Ambulatory Visit (HOSPITAL_COMMUNITY)
Admission: RE | Admit: 2022-02-08 | Discharge: 2022-02-08 | Disposition: A | Discharge status: Home / Self Care | Payer: Medicare Other | Attending: Cardiology | Admitting: Cardiology

## 2022-02-08 ENCOUNTER — Ambulatory Visit (HOSPITAL_COMMUNITY): Payer: Medicare Other | Admitting: Certified Registered"

## 2022-02-08 ENCOUNTER — Other Ambulatory Visit: Payer: Self-pay

## 2022-02-08 ENCOUNTER — Other Ambulatory Visit (HOSPITAL_COMMUNITY): Payer: Self-pay

## 2022-02-08 DIAGNOSIS — E785 Hyperlipidemia, unspecified: Secondary | ICD-10-CM | POA: Diagnosis not present

## 2022-02-08 DIAGNOSIS — I4891 Unspecified atrial fibrillation: Secondary | ICD-10-CM | POA: Diagnosis not present

## 2022-02-08 DIAGNOSIS — K219 Gastro-esophageal reflux disease without esophagitis: Secondary | ICD-10-CM | POA: Insufficient documentation

## 2022-02-08 DIAGNOSIS — I10 Essential (primary) hypertension: Secondary | ICD-10-CM

## 2022-02-08 DIAGNOSIS — I340 Nonrheumatic mitral (valve) insufficiency: Secondary | ICD-10-CM | POA: Diagnosis not present

## 2022-02-08 DIAGNOSIS — I480 Paroxysmal atrial fibrillation: Secondary | ICD-10-CM | POA: Diagnosis not present

## 2022-02-08 HISTORY — PX: ATRIAL FIBRILLATION ABLATION: EP1191

## 2022-02-08 LAB — POCT ACTIVATED CLOTTING TIME
Activated Clotting Time: 287 seconds
Activated Clotting Time: 305 seconds
Activated Clotting Time: 347 seconds

## 2022-02-08 SURGERY — ATRIAL FIBRILLATION ABLATION
Anesthesia: General

## 2022-02-08 MED ORDER — PROPOFOL 10 MG/ML IV BOLUS
INTRAVENOUS | Status: DC | PRN
Start: 2022-02-08 — End: 2022-02-08
  Administered 2022-02-08: 30 mg via INTRAVENOUS
  Administered 2022-02-08: 20 mg via INTRAVENOUS
  Administered 2022-02-08: 150 mg via INTRAVENOUS

## 2022-02-08 MED ORDER — DEXAMETHASONE SODIUM PHOSPHATE 10 MG/ML IJ SOLN
INTRAMUSCULAR | Status: DC | PRN
  Administered 2022-02-08: 10 mg via INTRAVENOUS

## 2022-02-08 MED ORDER — HEPARIN SODIUM (PORCINE) 1000 UNIT/ML IJ SOLN
INTRAMUSCULAR | Status: DC | PRN
  Administered 2022-02-08: 4000 [IU] via INTRAVENOUS
  Administered 2022-02-08: 2000 [IU] via INTRAVENOUS
  Administered 2022-02-08: 11000 [IU] via INTRAVENOUS

## 2022-02-08 MED ORDER — SODIUM CHLORIDE 0.9% FLUSH: 3.0000 mL | Freq: Two times a day (BID) | INTRAVENOUS | Status: DC

## 2022-02-08 MED ORDER — ACETAMINOPHEN 325 MG PO TABS
650.0000 mg | ORAL_TABLET | ORAL | Status: DC | PRN
  Filled 2022-02-08: qty 2

## 2022-02-08 MED ORDER — HEPARIN (PORCINE) IN NACL 1000-0.9 UT/500ML-% IV SOLN
INTRAVENOUS | Status: AC
Start: 2022-02-08 — End: ?
  Filled 2022-02-08: qty 2000

## 2022-02-08 MED ORDER — PANTOPRAZOLE SODIUM 40 MG PO TBEC
40.0000 mg | DELAYED_RELEASE_TABLET | Freq: Every day | ORAL | 0 refills | Status: DC
  Filled 2022-02-08: qty 45, 45d supply, fill #0

## 2022-02-08 MED ORDER — HEPARIN SODIUM (PORCINE) 1000 UNIT/ML IJ SOLN
INTRAMUSCULAR | Status: AC
  Filled 2022-02-08: qty 10

## 2022-02-08 MED ORDER — SODIUM CHLORIDE 0.9% FLUSH: 3.0000 mL | INTRAVENOUS | Status: DC | PRN

## 2022-02-08 MED ORDER — ISOPROTERENOL HCL 0.2 MG/ML IJ SOLN
INTRAMUSCULAR | Status: DC | PRN
  Administered 2022-02-08: 2 ug/min via INTRAVENOUS

## 2022-02-08 MED ORDER — ONDANSETRON HCL 4 MG/2ML IJ SOLN
INTRAMUSCULAR | Status: DC | PRN
  Administered 2022-02-08: 4 mg via INTRAVENOUS

## 2022-02-08 MED ORDER — PROTAMINE SULFATE 10 MG/ML IV SOLN
INTRAVENOUS | Status: DC | PRN
  Administered 2022-02-08: 35 mg via INTRAVENOUS

## 2022-02-08 MED ORDER — SODIUM CHLORIDE 0.9 % IV SOLN: INTRAVENOUS | Status: DC

## 2022-02-08 MED ORDER — ISOPROTERENOL HCL 0.2 MG/ML IJ SOLN
INTRAMUSCULAR | Status: AC
Start: 2022-02-08 — End: ?
  Filled 2022-02-08: qty 5

## 2022-02-08 MED ORDER — COLCHICINE 0.6 MG PO TABS
0.6000 mg | ORAL_TABLET | Freq: Two times a day (BID) | ORAL | Status: DC
  Administered 2022-02-08: 0.6 mg via ORAL
  Filled 2022-02-08 (×2): qty 1

## 2022-02-08 MED ORDER — APIXABAN 5 MG PO TABS
5.0000 mg | ORAL_TABLET | Freq: Two times a day (BID) | ORAL | Status: DC
  Administered 2022-02-08: 5 mg via ORAL
  Filled 2022-02-08 (×2): qty 1

## 2022-02-08 MED ORDER — ONDANSETRON HCL 4 MG/2ML IJ SOLN: 4.0000 mg | Freq: Four times a day (QID) | INTRAMUSCULAR | Status: DC | PRN

## 2022-02-08 MED ORDER — ACETAMINOPHEN 500 MG PO TABS
1000.0000 mg | ORAL_TABLET | Freq: Once | ORAL | Status: AC
  Administered 2022-02-08: 1000 mg via ORAL
  Filled 2022-02-08 (×2): qty 2

## 2022-02-08 MED ORDER — ROCURONIUM BROMIDE 10 MG/ML (PF) SYRINGE
PREFILLED_SYRINGE | INTRAVENOUS | Status: DC | PRN
  Administered 2022-02-08: 60 mg via INTRAVENOUS
  Administered 2022-02-08: 40 mg via INTRAVENOUS
  Administered 2022-02-08: 50 mg via INTRAVENOUS

## 2022-02-08 MED ORDER — FENTANYL CITRATE (PF) 250 MCG/5ML IJ SOLN
INTRAMUSCULAR | Status: DC | PRN
  Administered 2022-02-08: 100 ug via INTRAVENOUS

## 2022-02-08 MED ORDER — PANTOPRAZOLE SODIUM 40 MG PO TBEC
40.0000 mg | DELAYED_RELEASE_TABLET | Freq: Every day | ORAL | Status: DC
  Administered 2022-02-08: 40 mg via ORAL
  Filled 2022-02-08 (×2): qty 1

## 2022-02-08 MED ORDER — METOPROLOL TARTRATE 50 MG PO TABS
50.0000 mg | ORAL_TABLET | Freq: Two times a day (BID) | ORAL | Status: DC
  Administered 2022-02-08: 50 mg via ORAL
  Filled 2022-02-08 (×2): qty 1

## 2022-02-08 MED ORDER — PROPOFOL 500 MG/50ML IV EMUL
INTRAVENOUS | Status: DC | PRN
  Administered 2022-02-08: 125 ug/kg/min via INTRAVENOUS

## 2022-02-08 MED ORDER — LIDOCAINE 2% (20 MG/ML) 5 ML SYRINGE
INTRAMUSCULAR | Status: DC | PRN
  Administered 2022-02-08: 40 mg via INTRAVENOUS

## 2022-02-08 MED ORDER — PHENYLEPHRINE HCL-NACL 20-0.9 MG/250ML-% IV SOLN
INTRAVENOUS | Status: DC | PRN
Start: 2022-02-08 — End: 2022-02-08
  Administered 2022-02-08: 20 ug/min via INTRAVENOUS

## 2022-02-08 MED ORDER — SUGAMMADEX SODIUM 200 MG/2ML IV SOLN
INTRAVENOUS | Status: DC | PRN
  Administered 2022-02-08: 400 mg via INTRAVENOUS

## 2022-02-08 MED ORDER — HEPARIN (PORCINE) IN NACL 1000-0.9 UT/500ML-% IV SOLN
INTRAVENOUS | Status: DC | PRN
  Administered 2022-02-08 (×4): 500 mL

## 2022-02-08 MED ORDER — MIDAZOLAM HCL 2 MG/2ML IJ SOLN
INTRAMUSCULAR | Status: DC | PRN
  Administered 2022-02-08: 1 mg via INTRAVENOUS

## 2022-02-08 MED ORDER — COLCHICINE 0.6 MG PO TABS
0.6000 mg | ORAL_TABLET | Freq: Two times a day (BID) | ORAL | 0 refills | Status: DC
  Filled 2022-02-08: qty 10, 5d supply, fill #0

## 2022-02-08 MED ORDER — LACTATED RINGERS IV SOLN: INTRAVENOUS | Status: DC | PRN

## 2022-02-08 MED ORDER — HEPARIN SODIUM (PORCINE) 1000 UNIT/ML IJ SOLN
INTRAMUSCULAR | Status: DC | PRN
  Administered 2022-02-08: 1000 [IU] via INTRAVENOUS

## 2022-02-08 MED ORDER — SODIUM CHLORIDE 0.9 % IV SOLN: 250.0000 mL | INTRAVENOUS | Status: DC | PRN

## 2022-02-08 SURGICAL SUPPLY — 18 items
CATH MAPPNG PENTARAY F 2-6-2MM (CATHETERS) IMPLANT
CATH S CIRCA THERM PROBE 10F (CATHETERS) ×1 IMPLANT
CATH SMTCH THERMOCOOL SF DF (CATHETERS) ×1 IMPLANT
CATH SOUNDSTAR 3D IMAGING (CATHETERS) ×1 IMPLANT
CATH WEB BI DIR CSDF CRV REPRO (CATHETERS) ×1 IMPLANT
CLOSURE PERCLOSE PROSTYLE (VASCULAR PRODUCTS) ×4 IMPLANT
COVER SWIFTLINK CONNECTOR (BAG) ×2 IMPLANT
PACK EP LATEX FREE (CUSTOM PROCEDURE TRAY) ×2
PACK EP LF (CUSTOM PROCEDURE TRAY) ×1 IMPLANT
PAD DEFIB RADIO PHYSIO CONN (PAD) ×2 IMPLANT
PATCH CARTO3 (PAD) ×1 IMPLANT
PENTARAY F 2-6-2MM (CATHETERS) ×2
SHEATH AVANTI 11F 11CM (SHEATH) ×1 IMPLANT
SHEATH BAYLIS TRANSSEPTAL 98CM (NEEDLE) ×1 IMPLANT
SHEATH CARTO VIZIGO SM CVD (SHEATH) ×1 IMPLANT
SHEATH PINNACLE 8F 10CM (SHEATH) ×2 IMPLANT
SHEATH PROBE COVER 6X72 (BAG) ×1 IMPLANT
TUBING SMART ABLATE COOLFLOW (TUBING) ×1 IMPLANT

## 2022-02-08 NOTE — Discharge Instructions (Signed)
Post procedure care instructions ?No driving for 4 days. No lifting over 5 lbs for 1 week. No vigorous or sexual activity for 1 week. [name] return to work/your usual activities on 02/16/22. Keep procedure site clean & dry. If you notice increased pain, swelling, bleeding or pus, call/return!  [name] shower after 24 hours, but no soaking in baths/hot tubs/pools for 1 week.  ? ? ?You have an appointment set up with the [facility].  Multiple studies have shown that being followed by a dedicated atrial fibrillation clinic in addition to the standard care you receive from your other physicians improves health. We believe that enrollment in the atrial fibrillation clinic will allow [place] to better care for you.  ? ?The phone number to the [facility] is [phone]. The clinic is staffed Monday through Friday from 8:30am to 5pm. ? ?Parking Directions: The clinic is located in the Heart and Vascular Building connected to [facility]. ?1)From [address] turn on to [address] and go to the 3rd entrance  (Heart and Vascular entrance) on the right. ?2)Look to the right for Heart &Vascular Parking Garage. ?3)A code for the entrance is required, For Mary Potter is 1002.   ?4)Take the elevators to the 1st floor. Registration is in the room with the glass walls at the end of the hallway. ? ?If you have any trouble parking or locating the clinic, please don?t hesitate to call [phone].  ?

## 2022-02-08 NOTE — Anesthesia Postprocedure Evaluation (Signed)
Anesthesia Post Note ? ?Patient: KARLE DESROSIER ? ?Procedure(s) Performed: ATRIAL FIBRILLATION ABLATION ? ?  ? ?Patient location during evaluation: Phase II ?Anesthesia Type: General ?Level of consciousness: awake and alert, patient cooperative and oriented ?Pain management: pain level controlled ?Vital Signs Assessment: post-procedure vital signs reviewed and stable ?Respiratory status: spontaneous breathing, nonlabored ventilation and respiratory function stable ?Cardiovascular status: blood pressure returned to baseline and stable ?Postop Assessment: no apparent nausea or vomiting and adequate PO intake ?Anesthetic complications: no ? ? ?There were no known notable events for this encounter. ? ?Last Vitals:  ?Vitals:  ? 02/08/22 1615 02/08/22 1630  ?BP: 114/76 109/72  ?Pulse: 69 66  ?Resp: (!) 25 20  ?Temp:    ?SpO2: 96% 96%  ?  ?Last Pain:  ?Vitals:  ? 02/08/22 1335  ?TempSrc:   ?PainSc: 0-No pain  ? ? ?  ?  ?  ?  ?  ?  ? ?Majid Mccravy,E. Lashone Stauber ? ? ? ? ?

## 2022-02-08 NOTE — Transfer of Care (Signed)
Immediate Anesthesia Transfer of Care Note ? ?Patient: Mary Potter ? ?Procedure(s) Performed: ATRIAL FIBRILLATION ABLATION ? ?Patient Location: PACU ? ?Anesthesia Type:General ? ?Level of Consciousness: awake, alert  and oriented ? ?Airway & Oxygen Therapy: Patient Spontanous Breathing and Patient connected to nasal cannula oxygen ? ?Post-op Assessment: Report given to RN and Post -op Vital signs reviewed and stable ? ?Post vital signs: Reviewed and stable ? ?Last Vitals:  ?Vitals Value Taken Time  ?BP 129/62 02/08/22 1311  ?Temp 36.8 ?C 02/08/22 1252  ?Pulse 72 02/08/22 1314  ?Resp 14 02/08/22 1314  ?SpO2 97 % 02/08/22 1314  ?Vitals shown include unvalidated device data. ? ?Last Pain:  ?Vitals:  ? 02/08/22 1252  ?TempSrc: Temporal  ?PainSc: 0-No pain  ?   ? ?  ? ?Complications: There were no known notable events for this encounter. ?

## 2022-02-08 NOTE — H&P (Signed)
?Electrophysiology Office Note:   ?  ?Date:  02/08/2022  ?  ?ID:  Mary Potter, DOB September 24, 1946, MRN [id number] ?  ?PCP:  Velna Hatchet, MD            ?[facility] Cardiologist:  Minus Breeding, MD  ?[facility] Electrophysiologist:  Vickie Epley, MD  ?  ?Referring MD: Velna Hatchet, MD  ?  ?Chief Complaint: Paroxysmal atrial fibrillation ?  ?History of Present Illness:   ?  ?Mary Potter is a 76 y.o. female who presents for an evaluation of paroxysmal atrial fibrillation at the request of Dr. Percival Spanish. Their medical history includes hypertension, hyperlipidemia, GERD, atrial fibrillation.  The patient was seen by Dr. Percival Spanish October 12, 2021 because of an abnormal EKG.  At the time of his appointment with her, she has been due to atrial fibrillation for several days.  She apparently feels weak with palpitations when she is out of rhythm.  Dr. Percival Spanish confirmed atrial fibrillation from a home monitoring device. ?She takes Eliquis for stroke prophylaxis ?  ?She is with her husband today in clinic who is recording the visit. ? ?The patient tells me that she is weak in her arms and legs and short of breath when she is in atrial fibrillation.  She has had an episode of A. fib 14 times so far in January.  She is interested in a rhythm control strategy. ?  ? ?Plan for PVI today. ?  ?  ?Objective  ?  ?    ?Past Medical History:  ?Diagnosis Date  ? Atrial fibrillation ([place])    ? Dysrhythmia    ?  A-fib  ? Endometrial hyperplasia 01/03/2015  ? GERD (gastroesophageal reflux disease)    ? History of kidney stones    ? Hyperlipidemia    ? Hypertension    ? Pneumonia    ? PONV (postoperative nausea and vomiting)    ?  slow to wake up  ? Thickened endometrium 01/03/2015  ? Vaginal delivery 1976, 1985  ?  ?  ?     ?Past Surgical History:  ?Procedure Laterality Date  ? ANKLE FRACTURE SURGERY      ? CATARACT EXTRACTION Right 2020  ? DILATION AND CURETTAGE OF UTERUS   2012  ?  ablation   ? EYE SURGERY      ?   Corneal growth removal on right eye  ? HYSTEROSCOPY WITH D & C N/A 01/03/2015  ?  Procedure: DILATATION AND CURETTAGE /HYSTEROSCOPY;  Surgeon: [name], MD;  Location: [facility];  Service: Gynecology;  Laterality: N/A;  ? LAPAROSCOPY      ?  for endometriosis  ? ROBOTIC ASSISTED TOTAL HYSTERECTOMY WITH BILATERAL SALPINGO OOPHERECTOMY N/A 03/17/2021  ?  Procedure: XI ROBOTIC ASSISTED TOTAL HYSTERECTOMY WITH BILATERAL SALPINGO OOPHORECTOMY;  Surgeon: [name], MD;  Location: [facility];  Service: Gynecology;  Laterality: N/A;  ? SENTINEL NODE BIOPSY N/A 03/17/2021  ?  Procedure: SENTINEL NODE BIOPSY;  Surgeon: [name], MD;  Location: [facility];  Service: Gynecology;  Laterality: N/A;  ? TONSILLECTOMY AND ADENOIDECTOMY      ?  ?  ?Current Medications: ?Active Medications  ?    ?Current Meds  ?Medication Sig  ? ALPRAZolam (XANAX) 0.5 MG tablet Take 0.25-0.5 mg by mouth 2 (two) times daily as needed.  ? apixaban (ELIQUIS) 5 MG TABS tablet Take 1 tablet (5 mg total) by mouth 2 (two) times daily.  ? cholecalciferol (VITAMIN D) 1000 UNITS tablet  Take 1,000 Units by mouth every evening.   ? estradiol (VIVELLE-DOT) 0.025 MG/24HR Place 1 patch onto the skin 2 (two) times a week.  ? metoprolol tartrate (LOPRESSOR) 50 MG tablet Take 1 tablet (50 mg total) by mouth 2 (two) times daily.  ? olmesartan (BENICAR) 40 MG tablet Take 40 mg by mouth at bedtime.  ? Polyethyl Glycol-Propyl Glycol (SYSTANE OP) Place 1 drop into both eyes 2 (two) times daily as needed (dry eyes).  ? rosuvastatin (CRESTOR) 10 MG tablet Take 10 mg by mouth every Monday, Wednesday, and Friday.  ?  ?  ?  ?Allergies:   Talwin [pentazocine]  ?  ?Social History  ?  ?     ?Socioeconomic History  ? Marital status: Widowed  ?    Spouse name: [name]  ? Number of children: 2  ? Years of education: 61  ? Highest education level: Not on file  ?Occupational History  ? Occupation: retired  ?    Comment: [employer]  ?Tobacco Use  ? Smoking status: Never  ? Smokeless  tobacco: Never  ?Vaping Use  ? Vaping Use: Never used  ?Substance and Sexual Activity  ? Alcohol use: No  ?    Alcohol/week: 0.0 standard drinks  ? Drug use: No  ? Sexual activity: Yes  ?    Birth control/protection: Post-menopausal  ?Other Topics Concern  ? Not on file  ?Social History Narrative  ?  Lives with her younger daughter, [name]. Her older daughter lives in [place] with her family.  ?  ?Social Determinants of Health  ?  ?Financial Resource Strain: Not on file  ?Food Insecurity: Not on file  ?Transportation Needs: Not on file  ?Physical Activity: Not on file  ?Stress: Not on file  ?Social Connections: Not on file  ?  ?  ?Family History: ?The patient's family history includes Cancer in her paternal grandfather; Cervical cancer in her sister; Heart disease in her maternal grandmother and paternal grandmother; Heart disease (age of onset: 88) in her brother; Heart disease (age of onset: 21) in her brother; Heart disease (age of onset: 77) in her mother; Hypertension in her sister; Leukemia in her brother and brother; Prostate cancer in her father; Stroke in her father. There is no history of Colon cancer, Breast cancer, Ovarian cancer, Endometrial cancer, or Pancreatic cancer. ?  ?ROS:   ?Please see the history of present illness.    ?All other systems reviewed and are negative. ?  ?EKGs/Labs/Other Studies Reviewed:   ?  ?The following studies were reviewed today: ?  ?May 13, 2018 echo ?Left ventricular function normal ?Mild MR ?  ?EKG:  The ekg ordered today demonstrates sinus rhythm with frequent PACs.  Sinus arrhythmia. ?  ?  ?Recent Labs: ?03/11/2021: ALT 16; BUN 20; Creatinine, Ser 0.51; Hemoglobin 13.6; Platelets 262; Potassium 3.8; Sodium 139  ?Recent Lipid Panel ?Labs (Brief)  ?No results found for: CHOL, TRIG, HDL, CHOLHDL, VLDL, LDLCALC, LDLDIRECT  ? ?  ?Physical Exam:   ?  ?VS:  BP 171/90   Pulse 99   Ht 5' 3.5" (1.613 m)   Wt 158 lb 3.2 oz (71.8 kg)   SpO2 99%   BMI 27.58 kg/m?    ?  ?    ?Wt Readings from Last 3 Encounters:  ?11/27/21 158 lb 3.2 oz (71.8 kg)  ?10/12/21 158 lb 9.6 oz (71.9 kg)  ?10/09/21 158 lb 12.8 oz (72 kg)  ?  ?  ?GEN:  Well nourished, well developed in  no acute distress.  Appears younger than stated age. ?HEENT: Normal ?NECK: No JVD; No carotid bruits ?LYMPHATICS: No lymphadenopathy ?CARDIAC: RRR, no murmurs, rubs, gallops ?RESPIRATORY:  Clear to auscultation without rales, wheezing or rhonchi  ?ABDOMEN: Soft, non-tender, non-distended ?MUSCULOSKELETAL:  No edema; No deformity  ?SKIN: Warm and dry ?NEUROLOGIC:  Alert and oriented x 3 ?PSYCHIATRIC:  Normal affect  ?  ?  ?  ?  ?Assessment ?  ?  ?ASSESSMENT:   ?  ?1. PAF (paroxysmal atrial fibrillation) ([place])   ?2. Essential hypertension   ?  ?PLAN:   ?  ?In order of problems listed above: ?  ?#Paroxysmal atrial fibrillation ?Symptomatic.  She is on Eliquis for stroke stroke prophylaxis.  She is interested in a rhythm control strategy.  I briefly discussed antiarrhythmic drugs during today's visit and discussed catheter ablation in detail.  I discussed the risks and benefits of catheter ablation and the chance that we would need a repeat ablation or antiarrhythmic drugs in the future.  She wishes to proceed with scheduling.  We will need a CT scan and echocardiogram prior to the ablation procedure. ? ?Risk, benefits, and alternatives to EP study and radiofrequency ablation for afib were also discussed in detail today. These risks include but are not limited to stroke, bleeding, vascular damage, tamponade, perforation, damage to the esophagus, lungs, and other structures, pulmonary vein stenosis, worsening renal function, and death. The patient understands these risk and wishes to proceed.  We will therefore proceed with catheter ablation at the next available time.  Carto, ICE, anesthesia are requested for the procedure.  Will also obtain CT PV protocol prior to the procedure to exclude LAA thrombus and further evaluate atrial  anatomy. ?  ?#Hypertension ?Controlled.  Continue current medications. ?  ?  ? Plan for PVI today.  ?  ?   ?  ?Signed, ?Lysbeth Galas T. Quentin Ore, MD, [facility], [place] ?02/08/2022    ?Electrophysiology ?[place]

## 2022-02-08 NOTE — Anesthesia Preprocedure Evaluation (Addendum)
Anesthesia Evaluation  ?Patient identified by MRN, date of birth, ID band ?Patient awake ? ? ? ?Reviewed: ?Allergy & Precautions, NPO status , Patient's Chart, lab work & pertinent test results ? ?History of Anesthesia Complications ?(+) PONV and history of anesthetic complications ? ?Airway ?Mallampati: II ? ?TM Distance: >3 FB ?Neck ROM: Full ? ? ? Dental ? ?(+) Caps, Dental Advisory Given ?  ?Pulmonary ?neg pulmonary ROS,  ?  ?breath sounds clear to auscultation ? ? ? ? ? ? Cardiovascular ?hypertension, Pt. on medications and Pt. on home beta blockers ?(-) angina+ dysrhythmias Atrial Fibrillation  ?Rhythm:Irregular Rate:Tachycardia ? ?'19 ECHO: mild LVH, Systolic function was normal. EF  60-65%. Mild MR ? ?'19 Stress test: normal ?  ?Neuro/Psych ?Back pain ?  ? GI/Hepatic ?Neg liver ROS, GERD  Controlled,  ?Endo/Other  ?negative endocrine ROS ? Renal/GU ?negative Renal ROS  ? ?  ?Musculoskeletal ? ? Abdominal ?  ?Peds ? Hematology ?eliquis   ?Anesthesia Other Findings ? ? Reproductive/Obstetrics ? ?  ? ? ? ? ? ? ? ? ? ? ? ? ? ?  ?  ? ? ? ? ? ? ? ?Anesthesia Physical ?Anesthesia Plan ? ?ASA: 3 ? ?Anesthesia Plan: General  ? ?Post-op Pain Management: Tylenol PO (pre-op)*  ? ?Induction:  ? ?PONV Risk Score and Plan: 4 or greater and Dexamethasone, Treatment may vary due to age or medical condition, Diphenhydramine, Ondansetron and TIVA ? ?Airway Management Planned: Oral ETT ? ?Additional Equipment: None ? ?Intra-op Plan:  ? ?Post-operative Plan: Extubation in OR ? ?Informed Consent: I have reviewed the patients History and Physical, chart, labs and discussed the procedure including the risks, benefits and alternatives for the proposed anesthesia with the patient or authorized representative who has indicated his/her understanding and acceptance.  ? ? ? ?Dental advisory given ? ?Plan Discussed with: CRNA and Surgeon ? ?Anesthesia Plan Comments:   ? ? ? ? ? ?Anesthesia Quick  Evaluation ? ?

## 2022-02-08 NOTE — Anesthesia Procedure Notes (Signed)
Procedure Name: Intubation ?Date/Time: 02/08/2022 10:31 AM ?Performed by: Griffin Dakin, CRNA ?Pre-anesthesia Checklist: Patient identified, Emergency Drugs available, Suction available and Patient being monitored ?Patient Re-evaluated:Patient Re-evaluated prior to induction ?Oxygen Delivery Method: Circle system utilized ?Preoxygenation: Pre-oxygenation with 100% oxygen ?Induction Type: IV induction ?Ventilation: Mask ventilation without difficulty ?Laryngoscope Size: Mac and 4 ?Grade View: Grade I ?Tube type: Oral ?Number of attempts: 1 ?Airway Equipment and Method: Stylet ?Placement Confirmation: ETT inserted through vocal cords under direct vision, positive ETCO2 and breath sounds checked- equal and bilateral ?Secured at: 23 cm ?Tube secured with: Tape ?Dental Injury: Teeth and Oropharynx as per pre-operative assessment  ? ? ? ? ?

## 2022-02-09 ENCOUNTER — Encounter (HOSPITAL_COMMUNITY): Payer: Self-pay | Admitting: Cardiology

## 2022-02-09 ENCOUNTER — Other Ambulatory Visit (HOSPITAL_COMMUNITY): Payer: Self-pay

## 2022-02-11 ENCOUNTER — Other Ambulatory Visit: Payer: Self-pay | Admitting: Gynecologic Oncology

## 2022-02-11 ENCOUNTER — Telehealth: Payer: Self-pay

## 2022-02-11 DIAGNOSIS — I480 Paroxysmal atrial fibrillation: Secondary | ICD-10-CM

## 2022-02-11 DIAGNOSIS — E2839 Other primary ovarian failure: Secondary | ICD-10-CM

## 2022-02-11 MED ORDER — ESTRADIOL 0.025 MG/24HR TD PTTW: 1.0000 | MEDICATED_PATCH | TRANSDERMAL | 0 refills | Status: DC

## 2022-02-11 NOTE — Telephone Encounter (Signed)
Patient left voicemail stating that the [facility] is behind in refilling prescriptions. Her prescription for estradiol 0.025 mg should be ready by next week. Patient inquiring if a "temporary prescription" [facility] into [facility] [address]. [name], NP notified.  ? ?Spoke with patient and notified her that [name], NP has sent in additional [facility]radiol [facility] [address]. Encouraged patient to follow up with the pharmacy to check on the price of this as insurance may not cover it since her 90 day refill has already been sent it. Patient appreciative and verbalized understanding.  ?

## 2022-02-11 NOTE — Progress Notes (Signed)
See RN note.

## 2022-02-24 DIAGNOSIS — D225 Melanocytic nevi of trunk: Secondary | ICD-10-CM | POA: Diagnosis not present

## 2022-02-24 DIAGNOSIS — L905 Scar conditions and fibrosis of skin: Secondary | ICD-10-CM | POA: Diagnosis not present

## 2022-02-24 DIAGNOSIS — Z85828 Personal history of other malignant neoplasm of skin: Secondary | ICD-10-CM | POA: Diagnosis not present

## 2022-02-24 DIAGNOSIS — D692 Other nonthrombocytopenic purpura: Secondary | ICD-10-CM | POA: Diagnosis not present

## 2022-02-24 DIAGNOSIS — D1801 Hemangioma of skin and subcutaneous tissue: Secondary | ICD-10-CM | POA: Diagnosis not present

## 2022-02-24 DIAGNOSIS — L821 Other seborrheic keratosis: Secondary | ICD-10-CM | POA: Diagnosis not present

## 2022-02-24 DIAGNOSIS — L814 Other melanin hyperpigmentation: Secondary | ICD-10-CM | POA: Diagnosis not present

## 2022-02-24 DIAGNOSIS — L570 Actinic keratosis: Secondary | ICD-10-CM | POA: Diagnosis not present

## 2022-03-05 ENCOUNTER — Other Ambulatory Visit: Payer: Self-pay | Admitting: Gynecologic Oncology

## 2022-03-05 DIAGNOSIS — I480 Paroxysmal atrial fibrillation: Secondary | ICD-10-CM

## 2022-03-05 DIAGNOSIS — E2839 Other primary ovarian failure: Secondary | ICD-10-CM

## 2022-03-09 ENCOUNTER — Encounter (HOSPITAL_COMMUNITY): Payer: Self-pay | Admitting: Nurse Practitioner

## 2022-03-09 ENCOUNTER — Ambulatory Visit (HOSPITAL_COMMUNITY)
Admission: RE | Admit: 2022-03-09 | Discharge: 2022-03-09 | Disposition: A | Discharge status: Home / Self Care | Payer: Medicare Other | Source: Ambulatory Visit | Attending: Nurse Practitioner | Admitting: Nurse Practitioner

## 2022-03-09 VITALS — BP 168/92 | HR 78 | Ht 64.0 in | Wt 160.8 lb

## 2022-03-09 DIAGNOSIS — I4891 Unspecified atrial fibrillation: Secondary | ICD-10-CM | POA: Diagnosis not present

## 2022-03-09 DIAGNOSIS — I10 Essential (primary) hypertension: Secondary | ICD-10-CM | POA: Diagnosis not present

## 2022-03-09 DIAGNOSIS — Z7901 Long term (current) use of anticoagulants: Secondary | ICD-10-CM | POA: Diagnosis not present

## 2022-03-09 DIAGNOSIS — D6869 Other thrombophilia: Secondary | ICD-10-CM

## 2022-03-09 DIAGNOSIS — I480 Paroxysmal atrial fibrillation: Secondary | ICD-10-CM | POA: Diagnosis not present

## 2022-03-09 NOTE — Progress Notes (Signed)
? ?Primary Care Physician: Velna Hatchet, MD ?Referring Physician: Dr.Lambert ? ? ?Mary Potter is a 76 y.o. female with a h/o afib that is being seen in the afib clinic one month s/p ablation. She states that she has done well without any afib. No swallowing or groin issues. She is back to her normal activities. Compliant with anticoagulation.   ? ?Today, she denies symptoms of palpitations, chest pain, shortness of breath, orthopnea, PND, lower extremity edema, dizziness, presyncope, syncope, or neurologic sequela. The patient is tolerating medications without difficulties and is otherwise without complaint today.  ? ?Past Medical History:  ?Diagnosis Date  ? Atrial fibrillation ([place])   ? Dysrhythmia   ? A-fib  ? Endometrial hyperplasia 01/03/2015  ? GERD (gastroesophageal reflux disease)   ? History of kidney stones   ? Hyperlipidemia   ? Hypertension   ? Pneumonia   ? PONV (postoperative nausea and vomiting)   ? slow to wake up  ? Thickened endometrium 01/03/2015  ? Vaginal delivery 1976, 1985  ? ?Past Surgical History:  ?Procedure Laterality Date  ? ANKLE FRACTURE SURGERY    ? ATRIAL FIBRILLATION ABLATION N/A 02/08/2022  ? Procedure: ATRIAL FIBRILLATION ABLATION;  Surgeon: Vickie Epley, MD;  Location: [facility];  Service: Cardiovascular;  Laterality: N/A;  ? CATARACT EXTRACTION Right 2020  ? DILATION AND CURETTAGE OF UTERUS  2012  ? ablation   ? EYE SURGERY    ? Corneal growth removal on right eye  ? HYSTEROSCOPY WITH D & C N/A 01/03/2015  ? Procedure: DILATATION AND CURETTAGE /HYSTEROSCOPY;  Surgeon: [name], MD;  Location: [facility];  Service: Gynecology;  Laterality: N/A;  ? LAPAROSCOPY    ? for endometriosis  ? ROBOTIC ASSISTED TOTAL HYSTERECTOMY WITH BILATERAL SALPINGO OOPHERECTOMY N/A 03/17/2021  ? Procedure: XI ROBOTIC ASSISTED TOTAL HYSTERECTOMY WITH BILATERAL SALPINGO OOPHORECTOMY;  Surgeon: [name], MD;  Location: [facility];  Service: Gynecology;  Laterality: N/A;  ?  SENTINEL NODE BIOPSY N/A 03/17/2021  ? Procedure: SENTINEL NODE BIOPSY;  Surgeon: [name], MD;  Location: [facility];  Service: Gynecology;  Laterality: N/A;  ? TONSILLECTOMY AND ADENOIDECTOMY    ? ? ?Current Outpatient Medications  ?Medication Sig Dispense Refill  ? ALPRAZolam (XANAX) 0.5 MG tablet Take 0.25 mg by mouth daily as needed (anxiousness (afib)).    ? apixaban (ELIQUIS) 5 MG TABS tablet Take 1 tablet (5 mg total) by mouth 2 (two) times daily. 180 tablet 3  ? Cholecalciferol (VITAMIN D) 50 MCG (2000 UT) CAPS Take 2,000 Units by mouth every evening.    ? estradiol (VIVELLE-DOT) 0.025 MG/24HR PLACE 1 PATCH ONTO THE SKIN 2 (TWO) TIMES A WEEK. SUNDAYS & WEDNESDAYS 8 patch 0  ? fluorouracil (EFUDEX) 5 % cream Apply topically as needed.    ? metoprolol tartrate (LOPRESSOR) 50 MG tablet Take 1 tablet (50 mg total) by mouth 2 (two) times daily. 180 tablet 1  ? olmesartan (BENICAR) 40 MG tablet Take 20 mg by mouth at bedtime.    ? pantoprazole (PROTONIX) 40 MG tablet Take 1 tablet (40 mg total) by mouth daily. 45 tablet 0  ? Polyethyl Glycol-Propyl Glycol (SYSTANE OP) Place 1 drop into both eyes 2 (two) times daily as needed (dry eyes).    ? rosuvastatin (CRESTOR) 10 MG tablet Take 10 mg by mouth every Monday, Wednesday, and Friday. IN THE EVENING.    ? ?No current facility-administered medications for this encounter.  ? ? ?Allergies  ?Allergen Reactions  ?  Talwin [Pentazocine] Anaphylaxis  ? Hydrocodone Other (See Comments)  ?  dizziness  ? Prednisone Other (See Comments)  ?  Elevated blood pressure  ? ? ?Social History  ? ?Socioeconomic History  ? Marital status: Widowed  ?  Spouse name: [name]  ? Number of children: 2  ? Years of education: 28  ? Highest education level: Not on file  ?Occupational History  ? Occupation: retired  ?  Comment: [employer]  ?Tobacco Use  ? Smoking status: Never  ? Smokeless tobacco: Never  ?Vaping Use  ? Vaping Use: Never used  ?Substance and Sexual Activity  ? Alcohol use: No  ?   Alcohol/week: 0.0 standard drinks  ? Drug use: No  ? Sexual activity: Yes  ?  Birth control/protection: Post-menopausal  ?Other Topics Concern  ? Not on file  ?Social History Narrative  ? Lives with her younger daughter, [name]. Her older daughter lives in [place] with her family.  ? ?Social Determinants of Health  ? ?Financial Resource Strain: Not on file  ?Food Insecurity: Not on file  ?Transportation Needs: Not on file  ?Physical Activity: Not on file  ?Stress: Not on file  ?Social Connections: Not on file  ?Intimate Partner Violence: Not on file  ? ? ?Family History  ?Problem Relation Age of Onset  ? Heart disease Mother 33  ?     CABG  ? Stroke Father   ? Prostate cancer Father   ? Heart disease Brother 45  ?     Transplant, died 66 years  ? Heart disease Maternal Grandmother   ? Heart disease Paternal Grandmother   ? Cancer Paternal Grandfather   ? Heart disease Brother 12  ?     RF  ? Leukemia Brother   ? Leukemia Brother   ? Hypertension Sister   ? Cervical cancer Sister   ? Colon cancer Neg Hx   ? Breast cancer Neg Hx   ? Ovarian cancer Neg Hx   ? Endometrial cancer Neg Hx   ? Pancreatic cancer Neg Hx   ? ? ?ROS- All systems are reviewed and negative except as per the HPI above ? ?Physical Exam: ?Vitals:  ? 03/09/22 1053  ?BP: (!) 168/92  ?Pulse: 78  ?Weight: 72.9 kg  ?Height: '5\' 4"'$  (1.626 m)  ? ?Wt Readings from Last 3 Encounters:  ?03/09/22 72.9 kg  ?02/08/22 70.3 kg  ?11/27/21 71.8 kg  ? ? ?Labs: ?Lab Results  ?Component Value Date  ? NA 140 01/12/2022  ? K 4.6 01/12/2022  ? CL 102 01/12/2022  ? CO2 25 01/12/2022  ? GLUCOSE 102 (H) 01/12/2022  ? BUN 18 01/12/2022  ? CREATININE 0.76 01/12/2022  ? CALCIUM 9.2 01/12/2022  ? ?No results found for: INR ?No results found for: CHOL, HDL, LDLCALC, TRIG ? ? ?GEN- The patient is well appearing, alert and oriented x 3 today.   ?Head- normocephalic, atraumatic ?Eyes-  Sclera clear, conjunctiva pink ?Ears- hearing intact ?Oropharynx- clear ?Neck- supple, no  JVP ?Lymph- no cervical lymphadenopathy ?Lungs- Clear to ausculation bilaterally, normal work of breathing ?Heart- Regular rate and rhythm, no murmurs, rubs or gallops, PMI not laterally displaced ?GI- soft, NT, ND, + BS ?Extremities- no clubbing, cyanosis, or edema ?MS- no significant deformity or atrophy ?Skin- no rash or lesion ?Psych- euthymic mood, full affect ?Neuro- strength and sensation are intact ? ?EKG-Vent. rate 78 BPM ?PR interval 182 ms ?QRS duration 90 ms ?QT/QTcB 398/453 ms ?P-R-T axes 39 -48 23 ?Normal sinus  rhythm ?Left anterior fascicular block ?Left ventricular hypertrophy ( R in aVL , Cornell product , Romhilt-Estes ) ?Abnormal ECG ?When compared with ECG of 08-Feb-2022 12:59, ?PREVIOUS ECG IS PRESENT ? ? ? ?Assessment and Plan:  ?1. Afib ?S/p ablation x one month  ?Maintaining  SR  ?Continue metoprolol 50 mg bid  ? ?2. CHA2DS2VASc  score of 4 ?Continue eliquis 5 mg bid without interruption ? ?3. HTN ?Elevated today, per pt WC hypertension ?At home she states that her BP's are running low,often around 835 systolic  ?She can try to cut olmesartan  to 1/2 tab or 20 mg daily  ?Continue to monitor BP's at home  ?If elevates above 075 systolic on a regular basis go back to previous dose  ? ?F/u with Dr. Quentin Ore 7/17 ? ?Geroge Baseman Kayleen Memos, ANP-C ?Afib Clinic ?[facility] ?[address] ?[place], [place] [address] ?[phone]  ? ?

## 2022-04-01 DIAGNOSIS — R809 Proteinuria, unspecified: Secondary | ICD-10-CM | POA: Diagnosis not present

## 2022-04-01 DIAGNOSIS — R3129 Other microscopic hematuria: Secondary | ICD-10-CM | POA: Diagnosis not present

## 2022-04-01 DIAGNOSIS — I129 Hypertensive chronic kidney disease with stage 1 through stage 4 chronic kidney disease, or unspecified chronic kidney disease: Secondary | ICD-10-CM | POA: Diagnosis not present

## 2022-04-01 DIAGNOSIS — I480 Paroxysmal atrial fibrillation: Secondary | ICD-10-CM | POA: Diagnosis not present

## 2022-04-05 ENCOUNTER — Encounter: Payer: Self-pay | Admitting: Gynecologic Oncology

## 2022-04-09 ENCOUNTER — Inpatient Hospital Stay: Payer: Medicare Other | Attending: Gynecologic Oncology | Admitting: Gynecologic Oncology

## 2022-04-09 ENCOUNTER — Encounter: Payer: Self-pay | Admitting: Gynecologic Oncology

## 2022-04-09 ENCOUNTER — Other Ambulatory Visit: Payer: Self-pay

## 2022-04-09 VITALS — BP 149/80 | HR 71 | Temp 97.5°F | Resp 18 | Wt 157.1 lb

## 2022-04-09 DIAGNOSIS — Z8542 Personal history of malignant neoplasm of other parts of uterus: Secondary | ICD-10-CM | POA: Insufficient documentation

## 2022-04-09 DIAGNOSIS — C541 Malignant neoplasm of endometrium: Secondary | ICD-10-CM

## 2022-04-09 DIAGNOSIS — E2839 Other primary ovarian failure: Secondary | ICD-10-CM | POA: Diagnosis not present

## 2022-04-09 DIAGNOSIS — Z79890 Hormone replacement therapy (postmenopausal): Secondary | ICD-10-CM | POA: Insufficient documentation

## 2022-04-09 DIAGNOSIS — Z90710 Acquired absence of both cervix and uterus: Secondary | ICD-10-CM | POA: Insufficient documentation

## 2022-04-09 DIAGNOSIS — Z08 Encounter for follow-up examination after completed treatment for malignant neoplasm: Secondary | ICD-10-CM | POA: Insufficient documentation

## 2022-04-09 DIAGNOSIS — Z90722 Acquired absence of ovaries, bilateral: Secondary | ICD-10-CM | POA: Diagnosis not present

## 2022-04-09 NOTE — Progress Notes (Signed)
Gynecologic Oncology Return Clinic Visit  04/09/22  Reason for Visit: surveillance visit in the setting of uterine cancer  Treatment History: Oncology History Overview Note  MMR IHC intact MSI stable   Endometrial adenocarcinoma ([place])  02/10/2021 Imaging   Pelvic ultrasound: thickened endometrium with slight vascularity at the fundus.  The lining measured 13.5 mm.     02/16/2021 Initial Biopsy   EMB: [facility]   03/17/2021 Surgery   Robotic-assisted laparoscopic total hysterectomy with bilateral salpingoophorectomy, SLN biopsy   Findings: On EUA, small mobile uterus. Normal upper abdominal survey on intra-abdominal entry. Normal omentum, small and large bowel. 6-8 cm uterus, normal appearing. Normal bilateral adnexa. Mapping successful to bilateral SLNs. No intra-abdominal or pelvic evidence of disease.   03/17/2021 Pathology Results   A. SENTINEL LYMPH NODE, RIGHT EXTERNAL ILIAC, EXCISION:  - One lymph node negative for metastatic carcinoma (0/1).   B. SENTINEL LYMPH NODE, LEFT OBTURATOR, EXCISION:  - One lymph node negative for metastatic carcinoma (0/1).   C. 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      Focal superficial myometrial invasion.      No lymphovascular invasion.      Cervix, bilateral ovaries and bilateral fallopian tubes negative for  carcinoma.   ONCOLOGY TABLE:  UTERUS, CARCINOMA OR CARCINOSARCOMA: Resection  Procedure: Total hysterectomy, bilateral salpingo-oophorectomy with  right and left sentinel lymph nodes.  Histologic Type: Endometrioid adenocarcinoma.  Histologic Grade: FIGO grade 1.  Myometrial Invasion:       Depth of Myometrial Invasion: 2 mm.       Myometrial Thickness (mm): 25.       Percentage of Myometrial Invasion: 8%.  Uterine Serosa Involvement: Not identified.  Cervical stromal Involvement: Not identified.  Extent of involvement of  other tissue/organs: Not identified.  Lymphovascular Invasion: Not identified.  Regional Lymph Nodes:       Pelvic Lymph Nodes Examined: 2                                      Sentinel: 2                                      Non-sentinel: 0                                      Total: 2       Lymph Nodes with Metastasis: 0  Pathologic Stage Classification (pTNM, AJCC 8th Edition): pT1a, pN0  Ancillary Studies: MMR and MSI testing have been ordered.  Representative Tumor Block: C3-C9.  Comment(s): Cytokeratin AE1/AE3 performed on the sentinel lymph nodes  (parts A and B) is negative for metastatic carcinoma.  (v4.2.0.1)    03/20/2021 Initial Diagnosis   Endometrial adenocarcinoma ([place])     Interval History: Patient reports overall doing well.  She had become increasingly symptomatic with regard to her A-fib.  Had an ablation in May.  Denies any symptoms since.  This feeling overall much better.  She continues on her estrogen replacement, still has some vaginal dryness although this is significantly improved.  She denies any vaginal bleeding or discharge.  Reports regular bowel and bladder function.  Denies  any abdominal or pelvic pain.  Past Medical/Surgical History: Past Medical History:  Diagnosis Date   Atrial fibrillation ([place])    Dysrhythmia    A-fib   Endometrial hyperplasia 01/03/2015   GERD (gastroesophageal reflux disease)    History of kidney stones    Hyperlipidemia    Hypertension    Pneumonia    PONV (postoperative nausea and vomiting)    slow to wake up   Thickened endometrium 01/03/2015   Vaginal delivery 1976, 1985    Past Surgical History:  Procedure Laterality Date   ANKLE FRACTURE SURGERY     ATRIAL FIBRILLATION ABLATION N/A 02/08/2022   Procedure: ATRIAL FIBRILLATION ABLATION;  Surgeon: Vickie Epley, MD;  Location: [facility];  Service: Cardiovascular;  Laterality: N/A;   CATARACT EXTRACTION Right 2020   DILATION AND CURETTAGE OF UTERUS   2012   ablation    EYE SURGERY     Corneal growth removal on right eye   HYSTEROSCOPY WITH D & C N/A 01/03/2015   Procedure: DILATATION AND CURETTAGE /HYSTEROSCOPY;  Surgeon: [name], MD;  Location: [facility];  Service: Gynecology;  Laterality: N/A;   LAPAROSCOPY     for endometriosis   ROBOTIC ASSISTED TOTAL HYSTERECTOMY WITH BILATERAL SALPINGO OOPHERECTOMY N/A 03/17/2021   Procedure: XI ROBOTIC ASSISTED TOTAL HYSTERECTOMY WITH BILATERAL SALPINGO OOPHORECTOMY;  Surgeon: [name], MD;  Location: [facility];  Service: Gynecology;  Laterality: N/A;   SENTINEL NODE BIOPSY N/A 03/17/2021   Procedure: SENTINEL NODE BIOPSY;  Surgeon: [name], MD;  Location: [facility];  Service: Gynecology;  Laterality: N/A;   TONSILLECTOMY AND ADENOIDECTOMY      Family History  Problem Relation Age of Onset   Heart disease Mother 60       CABG   Stroke Father    Prostate cancer Father    Heart disease Brother 50       Transplant, died 53 years   Heart disease Maternal Grandmother    Heart disease Paternal Grandmother    Cancer Paternal Grandfather    Heart disease Brother 33       RF   Leukemia Brother    Leukemia Brother    Hypertension Sister    Cervical cancer Sister    Colon cancer Neg Hx    Breast cancer Neg Hx    Ovarian cancer Neg Hx    Endometrial cancer Neg Hx    Pancreatic cancer Neg Hx     Social History   Socioeconomic History   Marital status: Widowed    Spouse name: [name]   Number of children: 2   Years of education: 14   Highest education level: Not on file  Occupational History   Occupation: retired    Comment: [employer]  Tobacco Use   Smoking status: Never   Smokeless tobacco: Never  Vaping Use   Vaping Use: Never used  Substance and Sexual Activity   Alcohol use: No    Alcohol/week: 0.0 standard drinks of alcohol   Drug use: No   Sexual activity: Yes    Birth control/protection: Post-menopausal  Other Topics Concern   Not on file  Social History  Narrative   Lives with her younger daughter, [name]. Her older daughter lives in [place] with her family.   Social Determinants of Health   Financial Resource Strain: Not on file  Food Insecurity: Not on file  Transportation Needs: Not on file  Physical Activity: Not on file  Stress: Not on file  Social Connections: Not on file    Current Medications:  Current Outpatient Medications:    ALPRAZolam (XANAX) 0.5 MG tablet, Take 0.25 mg by mouth daily as needed (anxiousness (afib))., Disp: , Rfl:    apixaban (ELIQUIS) 5 MG TABS tablet, Take 1 tablet (5 mg total) by mouth 2 (two) times daily., Disp: 180 tablet, Rfl: 3   Cholecalciferol (VITAMIN D) 50 MCG (2000 UT) CAPS, Take 2,000 Units by mouth every evening., Disp: , Rfl:    estradiol (VIVELLE-DOT) 0.025 MG/24HR, PLACE 1 PATCH ONTO THE SKIN 2 (TWO) TIMES A WEEK. SUNDAYS & WEDNESDAYS, Disp: 8 patch, Rfl: 0   metoprolol tartrate (LOPRESSOR) 50 MG tablet, Take 1 tablet (50 mg total) by mouth 2 (two) times daily., Disp: 180 tablet, Rfl: 1   olmesartan (BENICAR) 20 MG tablet, Take 20 mg by mouth daily., Disp: , Rfl:    Polyethyl Glycol-Propyl Glycol (SYSTANE OP), Place 1 drop into both eyes 2 (two) times daily as needed (dry eyes)., Disp: , Rfl:    rosuvastatin (CRESTOR) 10 MG tablet, Take 10 mg by mouth every Monday, Wednesday, and Friday. IN THE EVENING., Disp: , Rfl:   Review of Systems: Denies appetite changes, fevers, chills, fatigue, unexplained weight changes. Denies hearing loss, neck lumps or masses, mouth sores, ringing in ears or voice changes. Denies cough or wheezing.  Denies shortness of breath. Denies chest pain or palpitations. Denies leg swelling. Denies abdominal distention, pain, blood in stools, constipation, diarrhea, nausea, vomiting, or early satiety. Denies pain with intercourse, dysuria, frequency, hematuria or incontinence. Denies hot flashes, pelvic pain, vaginal bleeding or vaginal discharge.   Denies joint  pain, back pain or muscle pain/cramps. Denies itching, rash, or wounds. Denies dizziness, headaches, numbness or seizures. Denies swollen lymph nodes or glands, denies easy bruising or bleeding. Denies anxiety, depression, confusion, or decreased concentration.  Physical Exam: BP (!) 149/80 (BP Location: Left Arm, Patient Position: Sitting)   Pulse 71   Temp (!) 97.5 F (36.4 C) (Tympanic)   Resp 18   Wt 157 lb 2 oz (71.3 kg)   SpO2 97%   BMI 26.97 kg/m  General: Alert, oriented, no acute distress. HEENT: Normocephalic, atraumatic, sclera anicteric. Chest: Unlabored breathing on room air.  Lungs clear to auscultation bilaterally without wheezes or rhonchi. Cardiovascular: Regular rate and rhythm, no murmurs. Abdomen: soft, nontender.  Normoactive bowel sounds.  No masses or hepatosplenomegaly appreciated.  Well-healed incisions. Extremities: Grossly normal range of motion.  Warm, well perfused.  No edema bilaterally. Skin: No rashes or lesions noted. Lymphatics: No cervical, supraclavicular, or inguinal adenopathy. GU: Normal appearing external genitalia without erythema, excoriation, or lesions.  Stable small caruncle.  Mildly atrophic vaginal mucosa, no lesions or masses seen, no discharge or bleeding.  Bimanual exam reveals cuff intact no nodularity or masses.  Rectovaginal exam confirms these findings.  Laboratory & Radiologic Studies: None new  Assessment & Plan: Mary Potter is a 76 y.o. woman with Stage IA grade 1 endometrioid endometrial adenocarcinoma.   Patient is overall doing quite well and is NED on exam today.     Patient has continued on her estrogen repletion. Recently underwent cardiac ablation, now with significant improvement in terms of her A-fib symptoms.  We discussed the option of trying to come off of the estrogen replacement, as the main reason for having started this was an increase in cardiac symptoms after her surgery.  We discussed that we could  transition to vaginal estrogen to treat her vaginal dryness.  At this point, given recent procedure, the patient prefers to stay on systemic low-dose estrogen for another 6 months.  We will revisit the discussion of coming off of it at her next visit.   We discussed NCCN and SGO surveillance recommendations, which differ some after the first year.  We will plan on visits every 6 months.  Today, we discussed within the next 6 to 12 months transitioning to every other visits alternating between my clinic and her OB/GYN.  We reviewed signs and symptoms that would be concerning for cancer recurrence.  22 minutes of total time was spent for this patient encounter, including preparation, face-to-face counseling with the patient and coordination of care, and documentation of the encounter.  Jeral Pinch, MD  Division of Gynecologic Oncology  Department of Obstetrics and Gynecology  [facility]

## 2022-04-09 NOTE — Patient Instructions (Addendum)
It was good to see you today.  I do not see any evidence of cancer recurrence on your exam.  Prior discussion, we will have you stay on the estrogen replacement at this time.  We will plan to revisit this again in 6 months.  My schedule is not out past the mid fall.  Please call back sometime in October or November to schedule a visit to see me in December.  As always, if you develop any new and concerning symptoms, please call to see me sooner.

## 2022-05-17 ENCOUNTER — Encounter: Payer: Self-pay | Admitting: *Deleted

## 2022-05-17 ENCOUNTER — Encounter: Payer: Self-pay | Admitting: Cardiology

## 2022-05-17 ENCOUNTER — Ambulatory Visit (INDEPENDENT_AMBULATORY_CARE_PROVIDER_SITE_OTHER): Payer: Medicare Other | Admitting: Cardiology

## 2022-05-17 VITALS — BP 154/94 | HR 89 | Ht 64.0 in | Wt 162.0 lb

## 2022-05-17 DIAGNOSIS — I4819 Other persistent atrial fibrillation: Secondary | ICD-10-CM

## 2022-05-17 DIAGNOSIS — I10 Essential (primary) hypertension: Secondary | ICD-10-CM

## 2022-05-17 MED ORDER — ROSUVASTATIN CALCIUM 20 MG PO TABS: 20.0000 mg | ORAL_TABLET | Freq: Every evening | ORAL | 3 refills | Status: AC

## 2022-05-17 NOTE — Progress Notes (Signed)
Electrophysiology Office Follow up Visit Note:    Date:  05/17/2022   ID:  Mary Potter, DOB 09-29-1946, MRN [id number]  PCP:  Velna Hatchet, MD  [facility] Cardiologist:  Minus Breeding, MD  [facility] Electrophysiologist:  Vickie Epley, MD    Interval History:    Mary Potter is a 76 y.o. female who presents for a follow up visit after her atrial fibrillation ablation on February 08, 2022.  During the ablation the veins and posterior wall were isolated.  The patient is done very well since the ablation without recurrence of her arrhythmia.       Past Medical History:  Diagnosis Date   Atrial fibrillation ([place])    Dysrhythmia    A-fib   Endometrial hyperplasia 01/03/2015   GERD (gastroesophageal reflux disease)    History of kidney stones    Hyperlipidemia    Hypertension    Pneumonia    PONV (postoperative nausea and vomiting)    slow to wake up   Thickened endometrium 01/03/2015   Vaginal delivery 1976, 1985    Past Surgical History:  Procedure Laterality Date   ANKLE FRACTURE SURGERY     ATRIAL FIBRILLATION ABLATION N/A 02/08/2022   Procedure: ATRIAL FIBRILLATION ABLATION;  Surgeon: Vickie Epley, MD;  Location: [facility];  Service: Cardiovascular;  Laterality: N/A;   CATARACT EXTRACTION Right 2020   DILATION AND CURETTAGE OF UTERUS  2012   ablation    EYE SURGERY     Corneal growth removal on right eye   HYSTEROSCOPY WITH D & C N/A 01/03/2015   Procedure: DILATATION AND CURETTAGE /HYSTEROSCOPY;  Surgeon: [name], MD;  Location: [facility];  Service: Gynecology;  Laterality: N/A;   LAPAROSCOPY     for endometriosis   ROBOTIC ASSISTED TOTAL HYSTERECTOMY WITH BILATERAL SALPINGO OOPHERECTOMY N/A 03/17/2021   Procedure: XI ROBOTIC ASSISTED TOTAL HYSTERECTOMY WITH BILATERAL SALPINGO OOPHORECTOMY;  Surgeon: [name], MD;  Location: [facility];  Service: Gynecology;  Laterality: N/A;   SENTINEL NODE BIOPSY N/A 03/17/2021    Procedure: SENTINEL NODE BIOPSY;  Surgeon: [name], MD;  Location: [facility];  Service: Gynecology;  Laterality: N/A;   TONSILLECTOMY AND ADENOIDECTOMY      Current Medications: Current Meds  Medication Sig   ALPRAZolam (XANAX) 0.5 MG tablet Take 0.25 mg by mouth daily as needed (anxiousness (afib)).   apixaban (ELIQUIS) 5 MG TABS tablet Take 1 tablet (5 mg total) by mouth 2 (two) times daily.   Cholecalciferol (VITAMIN D) 50 MCG (2000 UT) CAPS Take 2,000 Units by mouth every evening.   estradiol (VIVELLE-DOT) 0.025 MG/24HR PLACE 1 PATCH ONTO THE SKIN 2 (TWO) TIMES A WEEK. SUNDAYS & WEDNESDAYS   metoprolol tartrate (LOPRESSOR) 50 MG tablet Take 1 tablet (50 mg total) by mouth 2 (two) times daily.   olmesartan (BENICAR) 20 MG tablet Take 20 mg by mouth daily.   Polyethyl Glycol-Propyl Glycol (SYSTANE OP) Place 1 drop into both eyes 2 (two) times daily as needed (dry eyes).   rosuvastatin (CRESTOR) 20 MG tablet Take 1 tablet (20 mg total) by mouth every evening.   [DISCONTINUED] rosuvastatin (CRESTOR) 10 MG tablet Take 10 mg by mouth every Monday, Wednesday, and Friday. IN THE EVENING.     Allergies:   Talwin [pentazocine], Hydrocodone, and Prednisone   Social History   Socioeconomic History   Marital status: Widowed    Spouse name: [name]   Number of children: 2   Years of  education: 14   Highest education level: Not on file  Occupational History   Occupation: retired    Comment: [employer]  Tobacco Use   Smoking status: Never   Smokeless tobacco: Never  Vaping Use   Vaping Use: Never used  Substance and Sexual Activity   Alcohol use: No    Alcohol/week: 0.0 standard drinks of alcohol   Drug use: No   Sexual activity: Yes    Birth control/protection: Post-menopausal  Other Topics Concern   Not on file  Social History Narrative   Lives with her younger daughter, [name]. Her older daughter lives in [place] with her family.   Social Determinants of Health    Financial Resource Strain: Not on file  Food Insecurity: Not on file  Transportation Needs: Not on file  Physical Activity: Not on file  Stress: Not on file  Social Connections: Not on file     Family History: The patient's family history includes Cancer in her paternal grandfather; Cervical cancer in her sister; Heart disease in her maternal grandmother and paternal grandmother; Heart disease (age of onset: 77) in her brother; Heart disease (age of onset: 62) in her brother; Heart disease (age of onset: 73) in her mother; Hypertension in her sister; Leukemia in her brother and brother; Prostate cancer in her father; Stroke in her father. There is no history of Colon cancer, Breast cancer, Ovarian cancer, Endometrial cancer, or Pancreatic cancer.  ROS:   Please see the history of present illness.    All other systems reviewed and are negative.  EKGs/Labs/Other Studies Reviewed:    The following studies were reviewed today:  Reviewed CT PV protocol with the patient during today's clinic appointment.  EKG:  The ekg ordered today demonstrates sinus rhythm  Recent Labs: 01/12/2022: BUN 18; Creatinine, Ser 0.76; Hemoglobin 12.4; Platelets 266; Potassium 4.6; Sodium 140  Recent Lipid Panel No results found for: "CHOL", "TRIG", "HDL", "CHOLHDL", "VLDL", "LDLCALC", "LDLDIRECT"  Physical Exam:    VS:  BP (!) 154/94   Pulse 89   Ht '5\' 4"'$  (1.626 m)   Wt 162 lb (73.5 kg)   SpO2 94%   BMI 27.81 kg/m     Wt Readings from Last 3 Encounters:  05/17/22 162 lb (73.5 kg)  04/09/22 157 lb 2 oz (71.3 kg)  03/09/22 160 lb 12.8 oz (72.9 kg)     GEN:  Well nourished, well developed in no acute distress HEENT: Normal NECK: No JVD; No carotid bruits LYMPHATICS: No lymphadenopathy CARDIAC: RRR, no murmurs, rubs, gallops RESPIRATORY:  Clear to auscultation without rales, wheezing or rhonchi  ABDOMEN: Soft, non-tender, non-distended MUSCULOSKELETAL:  No edema; No deformity  SKIN: Warm and  dry NEUROLOGIC:  Alert and oriented x 3 PSYCHIATRIC:  Normal affect        ASSESSMENT:    1. Persistent atrial fibrillation ([place])   2. Essential hypertension    PLAN:    In order of problems listed above:   #Persistent atrial fibrillation Maintaining sinus rhythm after her April 2023 ablation.  For now, continue Eliquis for stroke prophylaxis.  She should also continue metoprolol.  #Hypertension Above goal today.  Recommend checking blood pressures at home 1-2 times per week and recording these values as she is already doing.  #Coronary artery disease #Aortic atherosclerosis Patient's preprocedural CT scan showed an elevated coronary artery calcium score.  She is on a low-dose statin 3 times a week.  We will increase this to 20 mg of Crestor daily.  I  do think it is reasonable for her to get back to routine exercise at the gym.  We discussed this during today's visit.  I will provide a letter to the gym stating that it would be okay for her to begin an exercise program.  Note sent to primary cardiologist, Dr. Percival Spanish.   Follow-up 1 year or sooner as needed.    Total time spent with patient today 40 minutes. This includes reviewing records, evaluating the patient and coordinating care.   Medication Adjustments/Labs and Tests Ordered: Current medicines are reviewed at length with the patient today.  Concerns regarding medicines are outlined above.  Orders Placed This Encounter  Procedures   EKG 12-Lead   Meds ordered this encounter  Medications   rosuvastatin (CRESTOR) 20 MG tablet    Sig: Take 1 tablet (20 mg total) by mouth every evening.    Dispense:  90 tablet    Refill:  3     Signed, Lars Mage, MD, [facility], [place] Day 05/17/2022 1:23 PM    Electrophysiology [facility]

## 2022-05-17 NOTE — Patient Instructions (Signed)
Medication Instructions:  Increase Crestor to 20 mg daily Your physician recommends that you continue on your current medications as directed. Please refer to the Current Medication list given to you today. *If you need a refill on your cardiac medications before your next appointment, please call your pharmacy*  Lab Work: None. If you have labs (blood work) drawn today and your tests are completely normal, you will receive your results only by: [place] (if you have MyChart) OR A paper copy in the mail If you have any lab test that is abnormal or we need to change your treatment, we will call you to review the results.  Testing/Procedures: None.  Follow-Up: At [facility], you and your health needs are our priority.  As part of our continuing mission to provide you with exceptional heart care, we have created designated Provider Care Teams.  These Care Teams include your primary Cardiologist (physician) and Advanced Practice Providers (APPs -  Physician Assistants and Nurse Practitioners) who all work together to provide you with the care you need, when you need it.  Your physician wants you to follow-up in: 12 months with Lars Mage, MD or one of the following Advanced Practice Providers on your designated Care Team:    Tommye Standard, [place] Legrand Como "Jonni Sanger" [place], [place]   You will receive a reminder letter in the mail two months in advance. If you don't receive a letter, please call our office to schedule the follow-up appointment.  We recommend signing up for the patient portal called "MyChart".  Sign up information is provided on this After Visit Summary.  MyChart is used to connect with patients for Virtual Visits (Telemedicine).  Patients are able to view lab/test results, encounter notes, upcoming appointments, etc.  Non-urgent messages can be sent to your provider as well.   To learn more about what you can do with MyChart, go to [email or web address].    Any Other  Special Instructions Will Be Listed Below (If Applicable).

## 2022-05-24 ENCOUNTER — Telehealth: Payer: Self-pay | Admitting: Cardiology

## 2022-05-24 ENCOUNTER — Other Ambulatory Visit: Payer: Self-pay

## 2022-05-24 MED ORDER — METOPROLOL TARTRATE 50 MG PO TABS: 50.0000 mg | ORAL_TABLET | Freq: Two times a day (BID) | ORAL | 3 refills | Status: DC

## 2022-05-24 NOTE — Telephone Encounter (Signed)
*  STAT* If patient is at the pharmacy, call can be transferred to refill team.   1. Which medications need to be refilled? (please list name of each medication and dose if known)  metoprolol tartrate (LOPRESSOR) 50 MG tablet  2. Which pharmacy/location (including street and city if local pharmacy) is medication to be sent to? metoprolol tartrate (LOPRESSOR) 50 MG tablet  3. Do they need a 30 day or 90 day supply? 90 day with refills.

## 2022-05-24 NOTE — Telephone Encounter (Signed)
Medication refilled and sent to desired pharmacy and patient is aware.

## 2022-06-18 DIAGNOSIS — M25571 Pain in right ankle and joints of right foot: Secondary | ICD-10-CM | POA: Diagnosis not present

## 2022-06-18 DIAGNOSIS — M19071 Primary osteoarthritis, right ankle and foot: Secondary | ICD-10-CM | POA: Diagnosis not present

## 2022-07-19 DIAGNOSIS — M19071 Primary osteoarthritis, right ankle and foot: Secondary | ICD-10-CM | POA: Diagnosis not present

## 2022-07-19 DIAGNOSIS — M25571 Pain in right ankle and joints of right foot: Secondary | ICD-10-CM | POA: Diagnosis not present

## 2022-08-06 DIAGNOSIS — E785 Hyperlipidemia, unspecified: Secondary | ICD-10-CM | POA: Diagnosis not present

## 2022-08-06 DIAGNOSIS — M810 Age-related osteoporosis without current pathological fracture: Secondary | ICD-10-CM | POA: Diagnosis not present

## 2022-08-06 DIAGNOSIS — I10 Essential (primary) hypertension: Secondary | ICD-10-CM | POA: Diagnosis not present

## 2022-08-06 DIAGNOSIS — R7303 Prediabetes: Secondary | ICD-10-CM | POA: Diagnosis not present

## 2022-08-06 DIAGNOSIS — R7989 Other specified abnormal findings of blood chemistry: Secondary | ICD-10-CM | POA: Diagnosis not present

## 2022-08-13 DIAGNOSIS — D6869 Other thrombophilia: Secondary | ICD-10-CM | POA: Diagnosis not present

## 2022-08-13 DIAGNOSIS — I480 Paroxysmal atrial fibrillation: Secondary | ICD-10-CM | POA: Diagnosis not present

## 2022-08-13 DIAGNOSIS — Z0000 Encounter for general adult medical examination without abnormal findings: Secondary | ICD-10-CM | POA: Diagnosis not present

## 2022-08-13 DIAGNOSIS — I10 Essential (primary) hypertension: Secondary | ICD-10-CM | POA: Diagnosis not present

## 2022-08-13 DIAGNOSIS — R809 Proteinuria, unspecified: Secondary | ICD-10-CM | POA: Diagnosis not present

## 2022-08-13 DIAGNOSIS — R3129 Other microscopic hematuria: Secondary | ICD-10-CM | POA: Diagnosis not present

## 2022-08-13 DIAGNOSIS — D692 Other nonthrombocytopenic purpura: Secondary | ICD-10-CM | POA: Diagnosis not present

## 2022-08-13 DIAGNOSIS — E785 Hyperlipidemia, unspecified: Secondary | ICD-10-CM | POA: Diagnosis not present

## 2022-08-13 DIAGNOSIS — C541 Malignant neoplasm of endometrium: Secondary | ICD-10-CM | POA: Diagnosis not present

## 2022-08-13 DIAGNOSIS — D649 Anemia, unspecified: Secondary | ICD-10-CM | POA: Diagnosis not present

## 2022-08-13 DIAGNOSIS — Z23 Encounter for immunization: Secondary | ICD-10-CM | POA: Diagnosis not present

## 2022-09-09 DIAGNOSIS — Z1231 Encounter for screening mammogram for malignant neoplasm of breast: Secondary | ICD-10-CM | POA: Diagnosis not present

## 2022-09-13 DIAGNOSIS — H538 Other visual disturbances: Secondary | ICD-10-CM | POA: Diagnosis not present

## 2022-09-13 DIAGNOSIS — Z961 Presence of intraocular lens: Secondary | ICD-10-CM | POA: Diagnosis not present

## 2022-09-15 DIAGNOSIS — Z1212 Encounter for screening for malignant neoplasm of rectum: Secondary | ICD-10-CM | POA: Diagnosis not present

## 2022-09-15 DIAGNOSIS — Z1211 Encounter for screening for malignant neoplasm of colon: Secondary | ICD-10-CM | POA: Diagnosis not present

## 2022-10-04 ENCOUNTER — Other Ambulatory Visit: Payer: Self-pay | Admitting: Cardiology

## 2022-10-04 DIAGNOSIS — I4819 Other persistent atrial fibrillation: Secondary | ICD-10-CM

## 2022-10-04 NOTE — Telephone Encounter (Signed)
Prescription refill request for Eliquis received. Indication: Afib  Last office visit: 05/17/22 Quentin Ore)  Scr: 0.76 (01/12/22)  Age: 76 Weight: 73.5kg  Appropriate dose and refill sent to requested pharmacy.

## 2022-11-11 DIAGNOSIS — Z961 Presence of intraocular lens: Secondary | ICD-10-CM | POA: Diagnosis not present

## 2022-11-11 DIAGNOSIS — H1131 Conjunctival hemorrhage, right eye: Secondary | ICD-10-CM | POA: Diagnosis not present

## 2022-11-23 DIAGNOSIS — E785 Hyperlipidemia, unspecified: Secondary | ICD-10-CM | POA: Diagnosis not present

## 2022-11-23 DIAGNOSIS — I10 Essential (primary) hypertension: Secondary | ICD-10-CM | POA: Diagnosis not present

## 2022-12-09 ENCOUNTER — Encounter (HOSPITAL_COMMUNITY): Payer: Self-pay | Admitting: *Deleted

## 2023-02-14 NOTE — Progress Notes (Unsigned)
  Cardiology Office Note:   Date:  02/15/2023  ID:  Mary Potter, DOB 03/01/1946, MRN [id number]  History of Present Illness:   Mary Potter is a 77 y.o. female who was referred by Holwerda, Scott, MD for evaluation of an abnormal EKG and HTN.  She had chest pain and had a normal echo and negative POET (Plain Old Exercise Treadmill).  She did have some mild calcium on CT.  She has PAF.   She had ablation.    She was doing well but went back into atrial fibrillation on Easter Sunday.  She can feel this and if she goes into it and she knows that it has been persistent.  The rates have been typically less than 100 but occasionally up to 130s and she will take an extra half of metoprolol.  She just feels a little more short of breath and weaker with this.  She is otherwise been feeling well.  She is not having any new chest pressure, neck or arm discomfort.  She is not having any new weight gain or edema.  ROS: As stated in the HPI and negative for all other systems.  Studies Reviewed:    EKG: Atrial flutter with variable conduction, left axis deviation, left ventricular hypertrophy by voltage criteria   Risk Assessment/Calculations:    CHA2DS2-VASc Score = 4  :1} This indicates a 4.8% annual risk of stroke. The patient's score is based upon: CHF History: 0 HTN History: 1 Diabetes History: 0 Stroke History: 0 Vascular Disease History: 0 Age Score: 2 Gender Score: 1    Physical Exam:   VS:  BP (!) 160/82 (BP Location: Left Arm, Patient Position: Sitting, Cuff Size: Normal)   Pulse 100   Ht 5' 3.5" (1.613 m)   Wt 160 lb 6.4 oz (72.8 kg)   SpO2 94%   BMI 27.97 kg/m    Wt Readings from Last 3 Encounters:  02/15/23 160 lb 6.4 oz (72.8 kg)  05/17/22 162 lb (73.5 kg)  04/09/22 157 lb 2 oz (71.3 kg)     GEN: Well nourished, well developed in no acute distress NECK: No JVD; No carotid bruits CARDIAC:  Irregular RR, no murmurs, rubs, gallops, irregular  RESPIRATORY:  Clear to  auscultation without rales, wheezing or rhonchi  ABDOMEN: Soft, non-tender, non-distended EXTREMITIES:  No edema; No deformity   ASSESSMENT AND PLAN:   Persistent atrial fibrillation: She has symptomatic atrial flutter I am going to send her for elective ablation.  She has not missed any of her anticoagulation.  At this point no change in therapy.  If she has recurrence after this we will consider antiarrhythmic therapy.  Hypertension: Her blood pressure is mildly elevated today but this is unusual.  No change in therapy.  Coronary artery disease: She has some aortic atherosclerosis with elevated coronary calcium.  She is pursuing primary risk reduction.  She has no symptoms.     Shared Decision Making/Informed Consent The risks (stroke, cardiac arrhythmias rarely resulting in the need for a temporary or permanent pacemaker, skin irritation or burns and complications associated with conscious sedation including aspiration, arrhythmia, respiratory failure and death), benefits (restoration of normal sinus rhythm) and alternatives of a direct current cardioversion were explained in detail to Ms. Zea and she agrees to proceed.     Signed, Latiya Navia, MD   

## 2023-02-14 NOTE — H&P (View-Only) (Signed)
  Cardiology Office Note:   Date:  02/15/2023  ID:  Mary Potter, DOB December 20, 1945, MRN [id number]  History of Present Illness:   Mary Potter is a 77 y.o. female who was referred by Mary Penna, MD for evaluation of an abnormal EKG and HTN.  She had chest pain and had a normal echo and negative POET (Plain Old Exercise Treadmill).  She did have some mild calcium on CT.  She has PAF.   She had ablation.    She was doing well but went back into atrial fibrillation on Easter Sunday.  She can feel this and if she goes into it and she knows that it has been persistent.  The rates have been typically less than 100 but occasionally up to 130s and she will take an extra half of metoprolol.  She just feels a little more short of breath and weaker with this.  She is otherwise been feeling well.  She is not having any new chest pressure, neck or arm discomfort.  She is not having any new weight gain or edema.  ROS: As stated in the HPI and negative for all other systems.  Studies Reviewed:    EKG: Atrial flutter with variable conduction, left axis deviation, left ventricular hypertrophy by voltage criteria   Risk Assessment/Calculations:    CHA2DS2-VASc Score = 4  :1} This indicates a 4.8% annual risk of stroke. The patient's score is based upon: CHF History: 0 HTN History: 1 Diabetes History: 0 Stroke History: 0 Vascular Disease History: 0 Age Score: 2 Gender Score: 1    Physical Exam:   VS:  BP (!) 160/82 (BP Location: Left Arm, Patient Position: Sitting, Cuff Size: Normal)   Pulse 100   Ht 5' 3.5" (1.613 m)   Wt 160 lb 6.4 oz (72.8 kg)   SpO2 94%   BMI 27.97 kg/m    Wt Readings from Last 3 Encounters:  02/15/23 160 lb 6.4 oz (72.8 kg)  05/17/22 162 lb (73.5 kg)  04/09/22 157 lb 2 oz (71.3 kg)     GEN: Well nourished, well developed in no acute distress NECK: No JVD; No carotid bruits CARDIAC:  Irregular RR, no murmurs, rubs, gallops, irregular  RESPIRATORY:  Clear to  auscultation without rales, wheezing or rhonchi  ABDOMEN: Soft, non-tender, non-distended EXTREMITIES:  No edema; No deformity   ASSESSMENT AND PLAN:   Persistent atrial fibrillation: She has symptomatic atrial flutter I am going to send her for elective ablation.  She has not missed any of her anticoagulation.  At this point no change in therapy.  If she has recurrence after this we will consider antiarrhythmic therapy.  Hypertension: Her blood pressure is mildly elevated today but this is unusual.  No change in therapy.  Coronary artery disease: She has some aortic atherosclerosis with elevated coronary calcium.  She is pursuing primary risk reduction.  She has no symptoms.     Shared Decision Making/Informed Consent The risks (stroke, cardiac arrhythmias rarely resulting in the need for a temporary or permanent pacemaker, skin irritation or burns and complications associated with conscious sedation including aspiration, arrhythmia, respiratory failure and death), benefits (restoration of normal sinus rhythm) and alternatives of a direct current cardioversion were explained in detail to Mary Potter and she agrees to proceed.     Signed, Rollene Rotunda, MD

## 2023-02-15 ENCOUNTER — Ambulatory Visit: Payer: Medicare Other | Attending: Cardiology | Admitting: Cardiology

## 2023-02-15 ENCOUNTER — Encounter: Payer: Self-pay | Admitting: Cardiology

## 2023-02-15 VITALS — BP 160/82 | HR 100 | Ht 63.5 in | Wt 160.4 lb

## 2023-02-15 DIAGNOSIS — I4811 Longstanding persistent atrial fibrillation: Secondary | ICD-10-CM

## 2023-02-15 DIAGNOSIS — I2510 Atherosclerotic heart disease of native coronary artery without angina pectoris: Secondary | ICD-10-CM

## 2023-02-15 DIAGNOSIS — I10 Essential (primary) hypertension: Secondary | ICD-10-CM | POA: Diagnosis not present

## 2023-02-15 NOTE — Addendum Note (Signed)
Addended by: Derenda Fennel on: 02/15/2023 04:29 PM   Modules accepted: Orders

## 2023-02-15 NOTE — Patient Instructions (Signed)
   Testing/Procedures:  You are scheduled for a Cardioversion on Thursday, April 18 with Dr. Servando Salina.  Please arrive at the [facility] (Main Entrance A) at [facility]: [address] [place], [place] [address] at 11:30 AM.   DIET:  Nothing to eat or drink after midnight except a sip of water with medications (see medication instructions below)  MEDICATION INSTRUCTIONS:        Continue taking your anticoagulant (blood thinner): Apixaban (Eliquis).  You will need to continue this after your procedure until you are told by your provider that it is safe to stop.    FYI:  For your safety, and to allow [place] to monitor your vital signs accurately during the surgery/procedure we request: If you have artificial nails, gel coating, SNS etc, please have those removed prior to your surgery/procedure. Not having the nail coverings /polish removed may result in cancellation or delay of your surgery/procedure.  You must have a responsible person to drive you home and stay in the waiting area during your procedure. Failure to do so could result in cancellation.  Bring your insurance cards.  *Special Note: Every effort is made to have your procedure done on time. Occasionally there are emergencies that occur at the hospital that may cause delays. Please be patient if a delay does occur.    Follow-Up: At [facility], you and your health needs are our priority.  As part of our continuing mission to provide you with exceptional heart care, we have created designated Provider Care Teams.  These Care Teams include your primary Cardiologist (physician) and Advanced Practice Providers (APPs -  Physician Assistants and Nurse Practitioners) who all work together to provide you with the care you need, when you need it.  We recommend signing up for the patient portal called "MyChart".  Sign up information is provided on this After Visit Summary.  MyChart is used to connect with patients for Virtual  Visits (Telemedicine).  Patients are able to view lab/test results, encounter notes, upcoming appointments, etc.  Non-urgent messages can be sent to your provider as well.   To learn more about what you can do with MyChart, go to [email or web address].    Your next appointment:   2 week(s)  Provider:   [facility]

## 2023-02-16 LAB — BASIC METABOLIC PANEL
BUN/Creatinine Ratio: 26 (ref 12–28)
BUN: 20 mg/dL (ref 8–27)
CO2: 24 mmol/L (ref 20–29)
Calcium: 9.8 mg/dL (ref 8.7–10.3)
Chloride: 99 mmol/L (ref 96–106)
Creatinine, Ser: 0.77 mg/dL (ref 0.57–1.00)
Glucose: 100 mg/dL — ABNORMAL HIGH (ref 70–99)
Potassium: 4.5 mmol/L (ref 3.5–5.2)
Sodium: 137 mmol/L (ref 134–144)
eGFR: 80 mL/min/{1.73_m2} (ref >59)

## 2023-02-16 LAB — CBC
Hematocrit: 36.9 % (ref 34.0–46.6)
Hemoglobin: 11.9 g/dL (ref 11.1–15.9)
MCH: 29.1 pg (ref 26.6–33.0)
MCHC: 32.2 g/dL (ref 31.5–35.7)
MCV: 90 fL (ref 79–97)
Platelets: 295 10*3/uL (ref 150–450)
RBC: 4.09 x10E6/uL (ref 3.77–5.28)
RDW: 14.3 % (ref 11.7–15.4)
WBC: 7 10*3/uL (ref 3.4–10.8)

## 2023-02-16 NOTE — Pre-Procedure Instructions (Signed)
Instructed patient on following items: Arrival time 1130 NPO after MN... Bp and anticoagulation meds ok with sips of water AM of procedure Responsible party to stay home after procedure and for 24hrs Have you missed any does of anticoagulant?Pt  states no missed dosage of eliquis, instructed to take scheduled does prior to procedure.

## 2023-02-17 ENCOUNTER — Ambulatory Visit (HOSPITAL_COMMUNITY)
Admission: RE | Admit: 2023-02-17 | Discharge: 2023-02-17 | Disposition: A | Discharge status: Home / Self Care | Payer: Medicare Other | Attending: Cardiology | Admitting: Cardiology

## 2023-02-17 ENCOUNTER — Encounter (HOSPITAL_COMMUNITY): Payer: Self-pay | Admitting: Cardiology

## 2023-02-17 ENCOUNTER — Other Ambulatory Visit: Payer: Self-pay

## 2023-02-17 ENCOUNTER — Ambulatory Visit (HOSPITAL_COMMUNITY): Payer: Medicare Other | Admitting: Anesthesiology

## 2023-02-17 ENCOUNTER — Encounter (HOSPITAL_COMMUNITY)
Admission: RE | Disposition: A | Discharge status: Home / Self Care | Payer: Self-pay | Source: Home / Self Care | Attending: Cardiology

## 2023-02-17 ENCOUNTER — Ambulatory Visit (HOSPITAL_BASED_OUTPATIENT_CLINIC_OR_DEPARTMENT_OTHER): Payer: Medicare Other | Admitting: Anesthesiology

## 2023-02-17 DIAGNOSIS — I4819 Other persistent atrial fibrillation: Secondary | ICD-10-CM | POA: Insufficient documentation

## 2023-02-17 DIAGNOSIS — I700 Atherosclerosis of aorta: Secondary | ICD-10-CM | POA: Diagnosis not present

## 2023-02-17 DIAGNOSIS — I10 Essential (primary) hypertension: Secondary | ICD-10-CM

## 2023-02-17 DIAGNOSIS — I4891 Unspecified atrial fibrillation: Secondary | ICD-10-CM

## 2023-02-17 DIAGNOSIS — I2510 Atherosclerotic heart disease of native coronary artery without angina pectoris: Secondary | ICD-10-CM | POA: Diagnosis not present

## 2023-02-17 DIAGNOSIS — I340 Nonrheumatic mitral (valve) insufficiency: Secondary | ICD-10-CM | POA: Diagnosis not present

## 2023-02-17 DIAGNOSIS — I4811 Longstanding persistent atrial fibrillation: Secondary | ICD-10-CM

## 2023-02-17 HISTORY — PX: CARDIOVERSION: SHX1299

## 2023-02-17 SURGERY — CARDIOVERSION
Anesthesia: General

## 2023-02-17 MED ORDER — PROPOFOL 10 MG/ML IV BOLUS
INTRAVENOUS | Status: DC | PRN
  Administered 2023-02-17: 80 mg via INTRAVENOUS

## 2023-02-17 MED ORDER — SODIUM CHLORIDE 0.9 % IV SOLN: INTRAVENOUS | Status: DC

## 2023-02-17 MED ORDER — SODIUM CHLORIDE 0.9 % IV SOLN: INTRAVENOUS | Status: DC | PRN

## 2023-02-17 SURGICAL SUPPLY — 1 items: ELECT DEFIB PAD ADLT CADENCE (PAD) ×1 IMPLANT

## 2023-02-17 NOTE — Anesthesia Procedure Notes (Signed)
Procedure Name: General with mask airway Date/Time: 02/17/2023 12:20 PM  Performed by: Adria Dill, CRNAPre-anesthesia Checklist: Patient identified, Emergency Drugs available, Suction available and Patient being monitored Patient Re-evaluated:Patient Re-evaluated prior to induction Oxygen Delivery Method: Nasal cannula Preoxygenation: Pre-oxygenation with 100% oxygen Induction Type: IV induction Placement Confirmation: positive ETCO2 and breath sounds checked- equal and bilateral Dental Injury: Teeth and Oropharynx as per pre-operative assessment

## 2023-02-17 NOTE — Transfer of Care (Signed)
Immediate Anesthesia Transfer of Care Note  Patient: Mary Potter  Procedure(s) Performed: CARDIOVERSION  Patient Location: Cath Lab  Anesthesia Type:General  Level of Consciousness: drowsy and patient cooperative  Airway & Oxygen Therapy: Patient Spontanous Breathing and Patient connected to nasal cannula oxygen  Post-op Assessment: Report given to RN and Post -op Vital signs reviewed and stable  Post vital signs: Reviewed and stable  Last Vitals:  Vitals Value Taken Time  BP 154/106 02/17/23 1210  Temp    Pulse 99 02/17/23 1210  Resp 18 02/17/23 1210  SpO2 97 % 02/17/23 1210  Vitals shown include unvalidated device data.  Last Pain:  Vitals:   02/17/23 1138  TempSrc: Temporal         Complications: No notable events documented.

## 2023-02-17 NOTE — Interval H&P Note (Signed)
History and Physical Interval Note:  02/17/2023 11:43 AM  [name]  has presented today for surgery, with the diagnosis of afib.  The various methods of treatment have been discussed with the patient and family. After consideration of risks, benefits and other options for treatment, the patient has consented to  Procedure(s): CARDIOVERSION (N/A) as a surgical intervention.  The patient's history has been reviewed, patient examined, no change in status, stable for surgery.  I have reviewed the patient's chart and labs.  Questions were answered to the patient's satisfaction.     Mary Potter

## 2023-02-17 NOTE — Discharge Instructions (Signed)

## 2023-02-17 NOTE — Anesthesia Postprocedure Evaluation (Signed)
Anesthesia Post Note  Patient: Mary Potter  Procedure(s) Performed: CARDIOVERSION     Patient location during evaluation: Cath Lab Anesthesia Type: General Level of consciousness: awake and alert, patient cooperative and oriented Pain management: pain level controlled Vital Signs Assessment: post-procedure vital signs reviewed and stable Respiratory status: spontaneous breathing, nonlabored ventilation and respiratory function stable Cardiovascular status: blood pressure returned to baseline and stable Postop Assessment: no apparent nausea or vomiting Anesthetic complications: no   No notable events documented.  Last Vitals:  Vitals:   02/17/23 1235 02/17/23 1245  BP: 100/64 116/68  Pulse: 68 76  Resp: 10 15  Temp:    SpO2: 97% 98%    Last Pain:  Vitals:   02/17/23 1245  TempSrc:   PainSc: 0-No pain                 Tangela Dolliver,E. Tyashia Morrisette

## 2023-02-17 NOTE — CV Procedure (Signed)
   Electrical Cardioversion Procedure Note Mary Potter [id number] [birth year]  Procedure: Electrical Cardioversion Indications:  Atrial Fibrillation  Time Out: Verified patient identification, verified procedure,medications/allergies/relevent history reviewed, required imaging and test results available. Performed  Procedure Details  The patient signed informed consent.   The patient was NPO past midnight. Has had therapeutic anticoagulation with Eliquis greater than 3 weeks. The patient denies any interruption of anticoagulation.  Anesthesia was administered by Dr. Jean Rosenthal.  Adequate airway was maintained throughout and vital followed per protocol.  He was cardioverted x 1 with 200 J of biphasic synchronized energy.  He converted to NSR.  There were no apparent complications.  The patient tolerated the procedure well and had normal neuro status and respiratory status post procedure with vitals stable as recorded elsewhere.     IMPRESSION:  Successful cardioversion of atrial fibrillation   Follow up:  We will arrange follow up with primary cardiologist.  He will continue on current medical therapy.  The patient advised to continue anticoagulation.  Mary Potter 02/17/2023, 12:20 PM

## 2023-02-17 NOTE — Anesthesia Preprocedure Evaluation (Addendum)
Anesthesia Evaluation  Patient identified by MRN, date of birth, ID band Patient awake    Reviewed: Allergy & Precautions, NPO status , Patient's Chart, lab work & pertinent test results  History of Anesthesia Complications (+) PONV  Airway Mallampati: II  TM Distance: >3 FB Neck ROM: Full    Dental  (+) Dental Advisory Given, Caps   Pulmonary neg pulmonary ROS   breath sounds clear to auscultation       Cardiovascular hypertension, Pt. on medications and Pt. on home beta blockers (-) angina + dysrhythmias Atrial Fibrillation  Rhythm:Irregular Rate:Normal  '19 ECHO: Left ventricle: The cavity size was normal. Wall thickness was    increased in a pattern of mild LVH. Systolic function was normal.    The estimated ejection fraction was in the range of 60% to 65%.  - Mitral valve: There was mild regurgitation.      Neuro/Psych negative neurological ROS     GI/Hepatic Neg liver ROS,GERD  Controlled,,  Endo/Other  negative endocrine ROS    Renal/GU negative Renal ROS     Musculoskeletal   Abdominal   Peds  Hematology eliquis   Anesthesia Other Findings Endometrial cancer  Reproductive/Obstetrics                              Anesthesia Physical Anesthesia Plan  ASA: 3  Anesthesia Plan: General   Post-op Pain Management: Minimal or no pain anticipated   Induction:   PONV Risk Score and Plan: 4 or greater and Treatment may vary due to age or medical condition  Airway Management Planned: Natural Airway and Nasal Cannula  Additional Equipment: None  Intra-op Plan:   Post-operative Plan:   Informed Consent: I have reviewed the patients History and Physical, chart, labs and discussed the procedure including the risks, benefits and alternatives for the proposed anesthesia with the patient or authorized representative who has indicated his/her understanding and acceptance.      Dental advisory given  Plan Discussed with: CRNA and Surgeon  Anesthesia Plan Comments:          Anesthesia Quick Evaluation

## 2023-02-18 ENCOUNTER — Encounter (HOSPITAL_COMMUNITY): Payer: Self-pay | Admitting: Cardiology

## 2023-02-18 ENCOUNTER — Encounter: Payer: Self-pay | Admitting: *Deleted

## 2023-02-25 DIAGNOSIS — D2271 Melanocytic nevi of right lower limb, including hip: Secondary | ICD-10-CM | POA: Diagnosis not present

## 2023-02-25 DIAGNOSIS — D2272 Melanocytic nevi of left lower limb, including hip: Secondary | ICD-10-CM | POA: Diagnosis not present

## 2023-02-25 DIAGNOSIS — L814 Other melanin hyperpigmentation: Secondary | ICD-10-CM | POA: Diagnosis not present

## 2023-02-25 DIAGNOSIS — Z85828 Personal history of other malignant neoplasm of skin: Secondary | ICD-10-CM | POA: Diagnosis not present

## 2023-02-25 DIAGNOSIS — D225 Melanocytic nevi of trunk: Secondary | ICD-10-CM | POA: Diagnosis not present

## 2023-02-25 DIAGNOSIS — D1801 Hemangioma of skin and subcutaneous tissue: Secondary | ICD-10-CM | POA: Diagnosis not present

## 2023-02-25 DIAGNOSIS — L738 Other specified follicular disorders: Secondary | ICD-10-CM | POA: Diagnosis not present

## 2023-02-25 DIAGNOSIS — L820 Inflamed seborrheic keratosis: Secondary | ICD-10-CM | POA: Diagnosis not present

## 2023-02-25 DIAGNOSIS — L821 Other seborrheic keratosis: Secondary | ICD-10-CM | POA: Diagnosis not present

## 2023-03-02 ENCOUNTER — Ambulatory Visit (HOSPITAL_COMMUNITY)
Admission: RE | Admit: 2023-03-02 | Discharge: 2023-03-02 | Disposition: A | Discharge status: Home / Self Care | Payer: Medicare Other | Source: Ambulatory Visit | Attending: Internal Medicine | Admitting: Internal Medicine

## 2023-03-02 ENCOUNTER — Encounter (HOSPITAL_COMMUNITY): Payer: Self-pay | Admitting: Internal Medicine

## 2023-03-02 VITALS — BP 148/88 | HR 78 | Ht 63.5 in | Wt 160.0 lb

## 2023-03-02 DIAGNOSIS — I480 Paroxysmal atrial fibrillation: Secondary | ICD-10-CM

## 2023-03-02 DIAGNOSIS — D6869 Other thrombophilia: Secondary | ICD-10-CM

## 2023-03-02 DIAGNOSIS — I10 Essential (primary) hypertension: Secondary | ICD-10-CM | POA: Insufficient documentation

## 2023-03-02 DIAGNOSIS — I4819 Other persistent atrial fibrillation: Secondary | ICD-10-CM | POA: Diagnosis not present

## 2023-03-02 DIAGNOSIS — I4892 Unspecified atrial flutter: Secondary | ICD-10-CM | POA: Insufficient documentation

## 2023-03-02 MED ORDER — APIXABAN 5 MG PO TABS
5.0000 mg | ORAL_TABLET | Freq: Two times a day (BID) | ORAL | 3 refills | Status: AC
Start: 2023-03-02 — End: ?

## 2023-03-02 NOTE — Progress Notes (Addendum)
Primary Care Physician: Alysia Penna, MD Referring Physician: Keiasha Diep is a 76 y.o. female with a h/o afib that is being seen in the afib clinic s/p ablation 02/08/22. She is back to her normal activities. Compliant with anticoagulation.    On follow up today, she is in SR. She feels great overall. She is s/p successful DCCV on 02/17/23. She was seen by cardiologist on 4/16 and noted to be in what appeared to be atypical atrial flutter. Patient states she has not had any other episodes of Afib since ablation. She feels weak and tired and short of breath when in Afib. No episodes of Afib since cardioversion. She has been compliant with Eliquis.  Today, she denies symptoms of palpitations, chest pain, shortness of breath, orthopnea, PND, lower extremity edema, dizziness, presyncope, syncope, or neurologic sequela. The patient is tolerating medications without difficulties and is otherwise without complaint today.   Past Medical History:  Diagnosis Date   Atrial fibrillation (HCC)    Dysrhythmia    A-fib   Endometrial hyperplasia 01/03/2015   GERD (gastroesophageal reflux disease)    History of kidney stones    Hyperlipidemia    Hypertension    Pneumonia    PONV (postoperative nausea and vomiting)    slow to wake up   Thickened endometrium 01/03/2015   Vaginal delivery 1976, 1985   Past Surgical History:  Procedure Laterality Date   ANKLE FRACTURE SURGERY     ATRIAL FIBRILLATION ABLATION N/A 02/08/2022   Procedure: ATRIAL FIBRILLATION ABLATION;  Surgeon: Lanier Prude, MD;  Location: [facility] INVASIVE CV LAB;  Service: Cardiovascular;  Laterality: N/A;   CARDIOVERSION N/A 02/17/2023   Procedure: CARDIOVERSION;  Surgeon: Thomasene Ripple, DO;  Location: [facility] INVASIVE CV LAB;  Service: Cardiovascular;  Laterality: N/A;   CATARACT EXTRACTION Right 2020   DILATION AND CURETTAGE OF UTERUS  2012   ablation    EYE SURGERY     Corneal growth removal on right eye    HYSTEROSCOPY WITH D & C N/A 01/03/2015   Procedure: DILATATION AND CURETTAGE /HYSTEROSCOPY;  Surgeon: Shea Evans, MD;  Location: [facility] OR[facility];  Service: Gynecology;  Laterality: N/A;   LAPAROSCOPY     for endometriosis   ROBOTIC ASSISTED TOTAL HYSTERECTOMY WITH BILATERAL SALPINGO OOPHERECTOMY N/A 03/17/2021   Procedure: XI ROBOTIC ASSISTED TOTAL HYSTERECTOMY WITH BILATERAL SALPINGO OOPHORECTOMY;  Surgeon: Carver Fila, MD;  Location: [facility] OR[facility];  Service: Gynecology;  Laterality: N/A;   SENTINEL NODE BIOPSY N/A 03/17/2021   Procedure: SENTINEL NODE BIOPSY;  Surgeon: Carver Fila, MD;  Location: [facility];  Service: Gynecology;  Laterality: N/A;   TONSILLECTOMY AND ADENOIDECTOMY      Current Outpatient Medications  Medication Sig Dispense Refill   acetaminophen (TYLENOL) 500 MG tablet Take 1,000 mg by mouth every 6 (six) hours as needed (Arthritis Pain).     ALPRAZolam (XANAX) 0.5 MG tablet Take 0.25 mg by mouth daily as needed (anxiousness).     Cholecalciferol (VITAMIN D) 50 MCG (2000 UT) CAPS Take 2,000 Units by mouth at bedtime.     estradiol (VIVELLE-DOT) 0.025 MG/24HR PLACE 1 PATCH ONTO THE SKIN 2 (TWO) TIMES A WEEK. SUNDAYS & WEDNESDAYS 8 patch 0   metoprolol tartrate (LOPRESSOR) 50 MG tablet Take 1 tablet (50 mg total) by mouth 2 (two) times daily. 180 tablet 3   olmesartan (BENICAR) 20 MG tablet Take 20 mg by mouth at bedtime.     Polyethyl Glycol-Propyl Glycol (SYSTANE OP) Place 1  drop into both eyes daily.     rosuvastatin (CRESTOR) 20 MG tablet Take 1 tablet (20 mg total) by mouth every evening. 90 tablet 3   apixaban (ELIQUIS) 5 MG TABS tablet Take 1 tablet (5 mg total) by mouth 2 (two) times daily. 180 tablet 3   No current facility-administered medications for this encounter.    Allergies  Allergen Reactions   Talwin [Pentazocine] Anaphylaxis   Hydrocodone Other (See Comments)    dizziness   Prednisone Other (See Comments)    Elevated blood pressure    Social History    Socioeconomic History   Marital status: Widowed    Spouse name: [name]   Number of children: 2   Years of education: 14   Highest education level: Not on file  Occupational History   Occupation: retired    Comment: [employer]  Tobacco Use   Smoking status: Never   Smokeless tobacco: Never   Tobacco comments:    Never smoke 03/02/23  Vaping Use   Vaping Use: Never used  Substance and Sexual Activity   Alcohol use: No    Alcohol/week: 0.0 standard drinks of alcohol   Drug use: No   Sexual activity: Yes    Birth control/protection: Post-menopausal  Other Topics Concern   Not on file  Social History Narrative   Lives with her younger daughter, [name]. Her older daughter lives in [place] with her family.   Social Determinants of Health   Financial Resource Strain: Not on file  Food Insecurity: Not on file  Transportation Needs: Not on file  Physical Activity: Not on file  Stress: Not on file  Social Connections: Not on file  Intimate Partner Violence: Not on file    Family History  Problem Relation Age of Onset   Heart disease Mother 83       CABG   Stroke Father    Prostate cancer Father    Heart disease Brother 52       Transplant, died 10 years   Heart disease Maternal Grandmother    Heart disease Paternal Grandmother    Cancer Paternal Grandfather    Heart disease Brother 72       RF   Leukemia Brother    Leukemia Brother    Hypertension Sister    Cervical cancer Sister    Colon cancer Neg Hx    Breast cancer Neg Hx    Ovarian cancer Neg Hx    Endometrial cancer Neg Hx    Pancreatic cancer Neg Hx     ROS- All systems are reviewed and negative except as per the HPI above  Physical Exam: Vitals:   03/02/23 1023  BP: (!) 148/88  Pulse: 78  Weight: 72.6 kg  Height: 5' 3.5" (1.613 m)   Wt Readings from Last 3 Encounters:  03/02/23 72.6 kg  02/15/23 72.8 kg  05/17/22 73.5 kg    Labs: Lab Results  Component Value Date   NA 137 02/15/2023    K 4.5 02/15/2023   CL 99 02/15/2023   CO2 24 02/15/2023   GLUCOSE 100 (H) 02/15/2023   BUN 20 02/15/2023   CREATININE 0.77 02/15/2023   CALCIUM 9.8 02/15/2023   No results found for: "INR" No results found for: "CHOL", "HDL", "LDLCALC", "TRIG"   GEN- The patient is well appearing, alert and oriented x 3 today.   Head- normocephalic, atraumatic Eyes-  Sclera clear, conjunctiva pink Ears- hearing intact Oropharynx- clear Neck- supple, no JVP Lymph- no cervical lymphadenopathy  Lungs- Clear to ausculation bilaterally, normal work of breathing Heart- Regular rate and rhythm, no murmurs, rubs or gallops, PMI not laterally displaced GI- soft, NT, ND, + BS Extremities- no clubbing, cyanosis, or edema MS- no significant deformity or atrophy Skin- no rash or lesion Psych- euthymic mood, full affect Neuro- strength and sensation are intact   EKG-Vent. rate 78 BPM PR interval 182 ms QRS duration 90 ms QT/QTcB 398/453 ms P-R-T axes 39 -48 23 Normal sinus rhythm Left anterior fascicular block Left ventricular hypertrophy ( R in aVL , Cornell product , Romhilt-Estes ) Abnormal ECG When compared with ECG of 08-Feb-2022 12:59, PREVIOUS ECG IS PRESENT    Assessment and Plan:  1. Persistent Afib / atrial flutter (appears atypical) S/p ablation 01/2022 S/p successful DCCV on 02/17/23.  She is currently in SR.   Continue metoprolol 50 mg bid - will send refill per patient request.  2. CHA2DS2VASc  score of 4 Continue eliquis 5 mg bid without interruption  3. HTN Elevated today, recommended to monitor at home.  F/u with Dr. Lalla Brothers in 6 months.   Lake Bells, PA-C Af[facility] [address] [place], [place] [address] [phone]

## 2023-03-03 ENCOUNTER — Telehealth: Payer: Self-pay | Admitting: *Deleted

## 2023-03-03 NOTE — Telephone Encounter (Signed)
Pt called to check on a follow up appointment with Dr.Tucker. Pt couldn't remember if she made an appointment and was last seen on April 06, 2022.   From Dr. Winferd Humphrey last note, pt was suppose to follow up in 6 months. Follow up appointment scheduled for June 28th at 1330. Pt agreed to date and time.

## 2023-03-13 ENCOUNTER — Emergency Department (HOSPITAL_BASED_OUTPATIENT_CLINIC_OR_DEPARTMENT_OTHER)
Admission: EM | Admit: 2023-03-13 | Discharge: 2023-03-13 | Disposition: A | Discharge status: Home / Self Care | Payer: Medicare Other | Attending: Emergency Medicine | Admitting: Emergency Medicine

## 2023-03-13 ENCOUNTER — Encounter (HOSPITAL_BASED_OUTPATIENT_CLINIC_OR_DEPARTMENT_OTHER): Payer: Self-pay | Admitting: Emergency Medicine

## 2023-03-13 ENCOUNTER — Other Ambulatory Visit: Payer: Self-pay

## 2023-03-13 ENCOUNTER — Emergency Department (HOSPITAL_BASED_OUTPATIENT_CLINIC_OR_DEPARTMENT_OTHER): Payer: Medicare Other

## 2023-03-13 DIAGNOSIS — R079 Chest pain, unspecified: Secondary | ICD-10-CM | POA: Diagnosis not present

## 2023-03-13 DIAGNOSIS — I4891 Unspecified atrial fibrillation: Secondary | ICD-10-CM | POA: Diagnosis not present

## 2023-03-13 DIAGNOSIS — Z79899 Other long term (current) drug therapy: Secondary | ICD-10-CM | POA: Insufficient documentation

## 2023-03-13 DIAGNOSIS — R0789 Other chest pain: Secondary | ICD-10-CM | POA: Diagnosis not present

## 2023-03-13 DIAGNOSIS — I10 Essential (primary) hypertension: Secondary | ICD-10-CM | POA: Insufficient documentation

## 2023-03-13 LAB — BASIC METABOLIC PANEL
Anion gap: 10 (ref 5–15)
BUN: 20 mg/dL (ref 8–23)
CO2: 25 mmol/L (ref 22–32)
Calcium: 9.4 mg/dL (ref 8.9–10.3)
Chloride: 102 mmol/L (ref 98–111)
Creatinine, Ser: 0.99 mg/dL (ref 0.44–1.00)
GFR, Estimated: 59 mL/min — ABNORMAL LOW (ref >60)
Glucose, Bld: 156 mg/dL — ABNORMAL HIGH (ref 70–99)
Potassium: 3.7 mmol/L (ref 3.5–5.1)
Sodium: 137 mmol/L (ref 135–145)

## 2023-03-13 LAB — TROPONIN I (HIGH SENSITIVITY)
Troponin I (High Sensitivity): 3 ng/L (ref <18)
Troponin I (High Sensitivity): 2 ng/L (ref <18)

## 2023-03-13 LAB — CBC
HCT: 36 % (ref 36.0–46.0)
Hemoglobin: 11.7 g/dL — ABNORMAL LOW (ref 12.0–15.0)
MCH: 29.5 pg (ref 26.0–34.0)
MCHC: 32.5 g/dL (ref 30.0–36.0)
MCV: 90.7 fL (ref 80.0–100.0)
Platelets: 243 10*3/uL (ref 150–400)
RBC: 3.97 MIL/uL (ref 3.87–5.11)
RDW: 14.1 % (ref 11.5–15.5)
WBC: 7.2 10*3/uL (ref 4.0–10.5)
nRBC: 0 % (ref 0.0–0.2)

## 2023-03-13 MED ORDER — ASPIRIN 81 MG PO CHEW
324.0000 mg | CHEWABLE_TABLET | Freq: Once | ORAL | Status: AC
  Administered 2023-03-13: 324 mg via ORAL
  Filled 2023-03-13: qty 4

## 2023-03-13 NOTE — ED Triage Notes (Signed)
Pt was out today and felt like she was going to pass out. Didn't  feel like eating lunch, went home and checked bp and was 180/100. Has some midsternal chest pressure.

## 2023-03-13 NOTE — Discharge Instructions (Addendum)
Follow-up with your primary care doctor and cardiologist to be rechecked.  Continue to monitor your blood pressure.  If you continue to have elevated measurements in home as we discussed increase your Benicar back to 40 mg daily.

## 2023-03-13 NOTE — ED Provider Notes (Signed)
[place] EMERGENCY DEPARTMENT AT [facility] Provider Note   CSN: [id number] Arrival date & time: 03/13/23  1555     History  Chief complaint: Near syncope, hypertension.  Mary Potter is a 77 y.o. female.  HPI   Patient has a history of hyperlipidemia, hypertension, atrial fibrillation.  Patient states she had been taking 40 mg of olmesartan for her blood pressure.  While she was in atrial fibrillation her blood pressure was lower so they decreased her dose to 20 mg daily.  Patient states she underwent a cardioversion procedure about a week or 2 ago.  Patient states today she went out for lunch and she started to feel lightheaded.  She felt like she might pass out.  She did not eat that much at the restaurant ended up going home and her blood pressure was elevated at 180/100.  Patient states she had some mild discomfort in her chest that she would not really call painful but with her elevated blood pressure and her chest discomfort she came to the ED for evaluation.  She denies any shortness of breath.  She is no longer having any chest discomfort.  Home Medications Prior to Admission medications   Medication Sig Start Date End Date Taking? Authorizing Provider  acetaminophen (TYLENOL) 500 MG tablet Take 1,000 mg by mouth every 6 (six) hours as needed (Arthritis Pain).    [provider]  ALPRAZolam Prudy Feeler) 0.5 MG tablet Take 0.25 mg by mouth daily as needed (anxiousness). 08/26/21   [provider]  apixaban (ELIQUIS) 5 MG TABS tablet Take 1 tablet (5 mg total) by mouth 2 (two) times daily. 03/02/23   Eustace Pen, PA-C  Cholecalciferol (VITAMIN D) 50 MCG (2000 UT) CAPS Take 2,000 Units by mouth at bedtime.    [provider]  estradiol (VIVELLE-DOT) 0.025 MG/24HR PLACE 1 PATCH ONTO THE SKIN 2 (TWO) TIMES A WEEK. SUNDAYS & [facility] 03/08/22   Warner Mccreedy D, NP  metoprolol tartrate (LOPRESSOR) 50 MG tablet Take 1 tablet (50 mg total) by mouth  2 (two) times daily. 05/24/22   Rollene Rotunda, MD  olmesartan (BENICAR) 20 MG tablet Take 20 mg by mouth at bedtime.    [provider]  Polyethyl Glycol-Propyl Glycol (SYSTANE OP) Place 1 drop into both eyes daily.    [provider]  rosuvastatin (CRESTOR) 20 MG tablet Take 1 tablet (20 mg total) by mouth every evening. 05/17/22   Lanier Prude, MD      Allergies    Talwin [pentazocine], Hydrocodone, and Prednisone    Review of Systems   Review of Systems  Physical Exam Updated Vital Signs BP (!) 156/58   Pulse 70   Temp 98.1 F (36.7 C) (Oral)   Resp 13   SpO2 98%  Physical Exam Vitals and nursing note reviewed.  Constitutional:      General: She is not in acute distress.    Appearance: She is well-developed.  HENT:     Head: Normocephalic and atraumatic.     Right Ear: External ear normal.     Left Ear: External ear normal.  Eyes:     General: No scleral icterus.       Right eye: No discharge.        Left eye: No discharge.     Conjunctiva/sclera: Conjunctivae normal.  Neck:     Trachea: No tracheal deviation.  Cardiovascular:     Rate and Rhythm: Normal rate and regular rhythm.  Pulmonary:  Effort: Pulmonary effort is normal. No respiratory distress.     Breath sounds: Normal breath sounds. No stridor. No wheezing or rales.  Abdominal:     General: Bowel sounds are normal. There is no distension.     Palpations: Abdomen is soft.     Tenderness: There is no abdominal tenderness. There is no guarding or rebound.  Musculoskeletal:        General: No tenderness or deformity.     Cervical back: Neck supple.  Skin:    General: Skin is warm and dry.     Findings: No rash.  Neurological:     General: No focal deficit present.     Mental Status: She is alert.     Cranial Nerves: No cranial nerve deficit, dysarthria or facial asymmetry.     Sensory: No sensory deficit.     Motor: No abnormal muscle tone or seizure activity.      Coordination: Coordination normal.  Psychiatric:        Mood and Affect: Mood normal.     ED Results / Procedures / Treatments   Labs (all labs ordered are listed, but only abnormal results are displayed) Labs Reviewed  BASIC METABOLIC PANEL - Abnormal; Notable for the following components:      Result Value   Glucose, Bld 156 (*)    GFR, Estimated 59 (*)    All other components within normal limits  CBC - Abnormal; Notable for the following components:   Hemoglobin 11.7 (*)    All other components within normal limits  TROPONIN I (HIGH SENSITIVITY)  TROPONIN I (HIGH SENSITIVITY)    EKG EKG Interpretation  Date/Time:  Sunday Mar 13 2023 16:03:47 EDT Ventricular Rate:  78 PR Interval:  198 QRS Duration: 96 QT Interval:  415 QTC Calculation: 473 R Axis:   -36 Text Interpretation: Sinus rhythm Abnormal R-wave progression, early transition Left ventricular hypertrophy No significant change since last tracing Confirmed by Linwood Dibbles [phone]) on 03/13/2023 4:04:46 PM  Radiology DG Chest Port 1 View  Result Date: 03/13/2023 CLINICAL DATA:  Chest pain EXAM: PORTABLE CHEST 1 VIEW COMPARISON:  11/20/2008 FINDINGS: Cardiac shadow is within normal limits. Mild central vascular congestion is seen without interstitial edema. No bony abnormality is noted. IMPRESSION: Mild vascular congestion without edema. Electronically Signed   By: Alcide Clever M.D.   On: 03/13/2023 17:31    Procedures Procedures    Medications Ordered in ED Medications  aspirin chewable tablet 324 mg (324 mg Oral Given 03/13/23 1629)    ED Course/ Medical Decision Making/ A&P Clinical Course as of 03/13/23 1901  Sun Mar 13, 2023  1735 CBC normal.  Metabolic panel normal.  First troponin normal. [JK]  1736 Chest x-ray shows mild vascular congestion without edema [JK]    Clinical Course User Index [JK] Linwood Dibbles, MD             HEART Score: 4                Medical Decision Making Problems  Addressed: Chest pain, unspecified type: acute illness or injury that poses a threat to life or bodily functions Hypertension, unspecified type: acute illness or injury that poses a threat to life or bodily functions  Amount and/or Complexity of Data Reviewed Labs: ordered. Radiology: ordered.  Risk OTC drugs.   Patient presented to the ED for evaluation of hypertension and tenderness.  Patient also had experienced some vague discomfort in her chest.  Concerned about the possibility  of acute coronary syndrome.  Patient was notably hypertensive on arrival but she is not having any neurologic deficits.  Doubt stroke TIA.  No evidence of anemia or electrolyte abnormalities on her blood test.  Patient serial troponins are normal.  Patient blood pressure also improved without additional medications.  This time I doubt acute coronary syndrome.  I feel the patient does not require admission and is appropriate for close outpatient follow-up.  Will have her continue to monitor her blood pressure closely.  If she continues to have elevated blood pressure readings we will have her increase her Benicar back up to 40 mg/day.  She previously had been on this dose but had to decrease to 20 mg/day when she was in atrial fibrillation.  Will have her follow-up closely with her PCP and cardiologist.        Final Clinical Impression(s) / ED Diagnoses Final diagnoses:  Chest pain, unspecified type  Hypertension, unspecified type    Rx / DC Orders ED Discharge Orders     None         Linwood Dibbles, MD 03/13/23 1901

## 2023-03-14 ENCOUNTER — Telehealth: Payer: Self-pay | Admitting: Cardiology

## 2023-03-14 NOTE — Telephone Encounter (Signed)
Received call from scheduling, stating patient BP is elevated. See numbers below.   Patient went to [facility] yesterday (notes in epic) they recommended if BP continued to stay elevated she could increase Benicar to 40 mg daily currently on 20 mg daily.   Patient does take Metoprolol 50 mg twice daily, she has not taken any of her morning medications this morning. BP when she woke up was 184/95 at 8:30 AM, then just a bit ago rechecked it was 159/87. Patient states she is just concerned with the readings. No changes per patient. She is requesting to see if Dr.Hochrein recommends she increase Benicar to 40 mg, or change to something else. She states the ED was concerned with doing that because it could bottom her out, which is why it was decreased before. Patient  scheduled for a follow up with Dr.Hochrein next week to discuss.   Patient verbalized understanding I would call back with recommendations, did advise to go ahead and take her Metoprolol dose this morning, and all other normal medications. Patient states she will do this.

## 2023-03-14 NOTE — Telephone Encounter (Signed)
Pt c/o BP issue: STAT if pt c/o blurred vision, one-sided weakness or slurred speech  1. What are your last 5 BP readings?   193/119 yesterday 182/94 10 pm yesterday 184/95 around 8:30 am 159/87 a little while ago  2. Are you having any other symptoms (ex. Dizziness, headache, blurred vision, passed out)?  No.  Patient stated she feels "washed out/tired"  3. What is your BP issue?    Patient stated she had an episode of lightheadedness yesterday and is concerned after her ablation she may need her medication changed.

## 2023-03-15 NOTE — Telephone Encounter (Signed)
Patient is aware of provider recommendations. She will increase her Benicar to 40mg  and keep a record of BP readings.  She states she has an appointment with you and wanted to know if she needs to keep appointment.

## 2023-03-15 NOTE — Telephone Encounter (Signed)
Patient returned RN's call. 

## 2023-03-15 NOTE — Telephone Encounter (Signed)
Patient called back and stated that last night she did increase her Benicar to 40mg  because she stated that her BP was 184/95,in the morning,then at 10am and 151/88 and then after lunch it was 141/83.  She stated she does not want to just increase the dosage without your approval but the high readings are a concern for her please advise.

## 2023-03-16 NOTE — Telephone Encounter (Signed)
Patient appointment rescheduled to 6/26 at 240pm

## 2023-03-23 DIAGNOSIS — M79643 Pain in unspecified hand: Secondary | ICD-10-CM | POA: Diagnosis not present

## 2023-03-23 DIAGNOSIS — F418 Other specified anxiety disorders: Secondary | ICD-10-CM | POA: Diagnosis not present

## 2023-03-23 DIAGNOSIS — R0989 Other specified symptoms and signs involving the circulatory and respiratory systems: Secondary | ICD-10-CM | POA: Diagnosis not present

## 2023-03-23 DIAGNOSIS — R000 Tachycardia, unspecified: Secondary | ICD-10-CM | POA: Diagnosis not present

## 2023-03-24 ENCOUNTER — Ambulatory Visit: Payer: Medicare Other | Admitting: Cardiology

## 2023-03-24 ENCOUNTER — Encounter: Payer: Self-pay | Admitting: Cardiology

## 2023-03-28 DIAGNOSIS — I2510 Atherosclerotic heart disease of native coronary artery without angina pectoris: Secondary | ICD-10-CM | POA: Insufficient documentation

## 2023-03-28 NOTE — Progress Notes (Signed)
  Cardiology Office Note:   Date:  03/30/2023  ID:  Radia, Falso 1946/04/13, MRN [id number]  History of Present Illness:   Mary Potter is a 77 y.o. female who was referred by Alysia Penna, MD for evaluation of an abnormal EKG and HTN.  She had chest pain and had a normal echo and negative POET (Plain Old Exercise Treadmill).  She did have some mild calcium on CT.  She has PAF.   She had ablation.    She has had AF. Since I last saw her she had DCCV.  She was in the ED 5/12 with hypertension.  I reviewed these records for this visit.    After the last visit she called with HTN and was told to increase her Benicar.  She returns for follow up. She says that she is feeling well.  The patient denies any new symptoms such as chest discomfort, neck or arm discomfort. There has been no new shortness of breath, PND or orthopnea. There have been no reported palpitations, presyncope or syncope.  She was in the ED with hypertensive urgency.  I reviewed these records for this visit.   Since increasing the Benicar she has had normal BPs in the 120/60 range.    ROS:   As stated in the HPI and negative for all other systems.    Studies Reviewed:    EKG: NSR, left axis deviation, left anterior fascicular block, no acute ST-T wave changes.  Risk Assessment/Calculations:    CHA2DS2-VASc Score = 4  :1} This indicates a 4.8% annual risk of stroke. The patient's score is based upon: CHF History: 0 HTN History: 1 Diabetes History: 0 Stroke History: 0 Vascular Disease History: 0 Age Score: 2 Gender Score: 1    Physical Exam:   VS:  BP 138/72   Pulse 75   Ht 5' 3.5" (1.613 m)   Wt 157 lb (71.2 kg)   SpO2 98%   BMI 27.38 kg/m    Wt Readings from Last 3 Encounters:  03/30/23 157 lb (71.2 kg)  03/02/23 160 lb (72.6 kg)  02/15/23 160 lb 6.4 oz (72.8 kg)     GEN:   Well  nourished, well developed in no acute distress NECK:   JVD; No carotid bruits CARDIAC:  RRR, no murmurs, rubs,  gallops, irregular  RESPIRATORY:  Clear  auscultation without rales, wheezing or rhonchi  ABDOMEN: Positive bowel sounds, no rebound no guarding EXTREMITIES:  No edema;   ASSESSMENT AND PLAN:   Persistent atrial fibrillation: She is maintaining sinus rhythm.  Given her elevated score above she and I have discussed continuing anticoagulation and she agrees with my suggestion to continue.   Hypertension: Her blood pressure is now controlled.  If she has more spiking blood pressures, I talked to her about as needed hydralazine.  Coronary artery disease:    She has no symptoms.  She will continue with risk reduction.   Signed, Rollene Rotunda, MD

## 2023-03-30 ENCOUNTER — Encounter: Payer: Self-pay | Admitting: Cardiology

## 2023-03-30 ENCOUNTER — Ambulatory Visit: Payer: Medicare Other | Attending: Cardiology | Admitting: Cardiology

## 2023-03-30 VITALS — BP 138/72 | HR 75 | Ht 63.5 in | Wt 157.0 lb

## 2023-03-30 DIAGNOSIS — I2510 Atherosclerotic heart disease of native coronary artery without angina pectoris: Secondary | ICD-10-CM | POA: Diagnosis not present

## 2023-03-30 DIAGNOSIS — I10 Essential (primary) hypertension: Secondary | ICD-10-CM | POA: Diagnosis not present

## 2023-03-30 DIAGNOSIS — I4819 Other persistent atrial fibrillation: Secondary | ICD-10-CM | POA: Diagnosis not present

## 2023-03-30 NOTE — Patient Instructions (Signed)
Medication Instructions:  NO CHANGES  *If you need a refill on your cardiac medications before your next appointment, please call your pharmacy*   Follow-Up: At [facility], you and your health needs are our priority.  As part of our continuing mission to provide you with exceptional heart care, we have created designated Provider Care Teams.  These Care Teams include your primary Cardiologist (physician) and Advanced Practice Providers (APPs -  Physician Assistants and Nurse Practitioners) who all work together to provide you with the care you need, when you need it.  We recommend signing up for the patient portal called "MyChart".  Sign up information is provided on this After Visit Summary.  MyChart is used to connect with patients for Virtual Visits (Telemedicine).  Patients are able to view lab/test results, encounter notes, upcoming appointments, etc.  Non-urgent messages can be sent to your provider as well.   To learn more about what you can do with MyChart, go to [email or web address].    Your next appointment:    6 months with Dr. Hochrein   

## 2023-04-05 DIAGNOSIS — M79642 Pain in left hand: Secondary | ICD-10-CM | POA: Diagnosis not present

## 2023-04-05 DIAGNOSIS — M180 Bilateral primary osteoarthritis of first carpometacarpal joints: Secondary | ICD-10-CM | POA: Diagnosis not present

## 2023-04-05 DIAGNOSIS — M79641 Pain in right hand: Secondary | ICD-10-CM | POA: Diagnosis not present

## 2023-04-27 ENCOUNTER — Ambulatory Visit: Payer: Medicare Other | Admitting: Cardiology

## 2023-04-29 ENCOUNTER — Other Ambulatory Visit: Payer: Self-pay

## 2023-04-29 ENCOUNTER — Inpatient Hospital Stay: Payer: Medicare Other | Attending: Gynecologic Oncology | Admitting: Gynecologic Oncology

## 2023-04-29 ENCOUNTER — Encounter: Payer: Self-pay | Admitting: Gynecologic Oncology

## 2023-04-29 VITALS — BP 148/81 | HR 77 | Temp 98.4°F | Ht 63.39 in | Wt 157.2 lb

## 2023-04-29 DIAGNOSIS — N9089 Other specified noninflammatory disorders of vulva and perineum: Secondary | ICD-10-CM | POA: Insufficient documentation

## 2023-04-29 DIAGNOSIS — Z90710 Acquired absence of both cervix and uterus: Secondary | ICD-10-CM | POA: Diagnosis not present

## 2023-04-29 DIAGNOSIS — Z90722 Acquired absence of ovaries, bilateral: Secondary | ICD-10-CM | POA: Insufficient documentation

## 2023-04-29 DIAGNOSIS — Z8542 Personal history of malignant neoplasm of other parts of uterus: Secondary | ICD-10-CM | POA: Diagnosis not present

## 2023-04-29 DIAGNOSIS — C541 Malignant neoplasm of endometrium: Secondary | ICD-10-CM

## 2023-04-29 DIAGNOSIS — N952 Postmenopausal atrophic vaginitis: Secondary | ICD-10-CM | POA: Insufficient documentation

## 2023-04-29 NOTE — Progress Notes (Signed)
Gynecologic Oncology Return Clinic Visit  04/29/23  Reason for Visit: surveillance visit in the setting of uterine cancer   Treatment History: Oncology History Overview Note  MMR IHC intact MSI stable   Endometrial adenocarcinoma (HCC)  02/10/2021 Imaging   Pelvic ultrasound: thickened endometrium with slight vascularity at the fundus.  The lining measured 13.5 mm.     02/16/2021 Initial Biopsy   EMB: [facility]   03/17/2021 Surgery   Robotic-assisted laparoscopic total hysterectomy with bilateral salpingoophorectomy, SLN biopsy   Findings: On EUA, small mobile uterus. Normal upper abdominal survey on intra-abdominal entry. Normal omentum, small and large bowel. 6-8 cm uterus, normal appearing. Normal bilateral adnexa. Mapping successful to bilateral SLNs. No intra-abdominal or pelvic evidence of disease.   03/17/2021 Pathology Results   A. SENTINEL LYMPH NODE, RIGHT EXTERNAL ILIAC, EXCISION:  - One lymph node negative for metastatic carcinoma (0/1).   B. SENTINEL LYMPH NODE, LEFT OBTURATOR, EXCISION:  - One lymph node negative for metastatic carcinoma (0/1).   C. 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      Focal superficial myometrial invasion.      No lymphovascular invasion.      Cervix, bilateral ovaries and bilateral fallopian tubes negative for  carcinoma.   ONCOLOGY TABLE:  UTERUS, CARCINOMA OR CARCINOSARCOMA: Resection  Procedure: Total hysterectomy, bilateral salpingo-oophorectomy with  right and left sentinel lymph nodes.  Histologic Type: Endometrioid adenocarcinoma.  Histologic Grade: FIGO grade 1.  Myometrial Invasion:       Depth of Myometrial Invasion: 2 mm.       Myometrial Thickness (mm): 25.       Percentage of Myometrial Invasion: 8%.  Uterine Serosa Involvement: Not identified.  Cervical stromal Involvement: Not identified.  Extent of involvement  of other tissue/organs: Not identified.  Lymphovascular Invasion: Not identified.  Regional Lymph Nodes:       Pelvic Lymph Nodes Examined: 2                                      Sentinel: 2                                      Non-sentinel: 0                                      Total: 2       Lymph Nodes with Metastasis: 0  Pathologic Stage Classification (pTNM, AJCC 8th Edition): pT1a, pN0  Ancillary Studies: MMR and MSI testing have been ordered.  Representative Tumor Block: C3-C9.  Comment(s): Cytokeratin AE1/AE3 performed on the sentinel lymph nodes  (parts A and B) is negative for metastatic carcinoma.  (v4.2.0.1)    03/20/2021 Initial Diagnosis   Endometrial adenocarcinoma (HCC)     Interval History: Had a cardioversion in April, had a reaction to the cardioversion that took her to the emergency department and early May.  Saw cardiology last in late May.  Now is doing much better.  She denies any vaginal bleeding or discharge.  Denies menopausal symptoms.  Continues to use her low-dose estrogen patch without issues.  Reports regular bowel and bladder function.  Denies any  abdominal or pelvic pain.  Past Medical/Surgical History: Past Medical History:  Diagnosis Date   Atrial fibrillation (HCC)    Dysrhythmia    A-fib   Endometrial hyperplasia 01/03/2015   GERD (gastroesophageal reflux disease)    History of kidney stones    Hyperlipidemia    Hypertension    Pneumonia    PONV (postoperative nausea and vomiting)    slow to wake up   Thickened endometrium 01/03/2015   Vaginal delivery 1976, 1985    Past Surgical History:  Procedure Laterality Date   ANKLE FRACTURE SURGERY     ATRIAL FIBRILLATION ABLATION N/A 02/08/2022   Procedure: ATRIAL FIBRILLATION ABLATION;  Surgeon: Mary Prude, MD;  Location: [facility] INVASIVE CV LAB;  Service: Cardiovascular;  Laterality: N/A;   CARDIOVERSION N/A 02/17/2023   Procedure: CARDIOVERSION;  Surgeon: Mary Ripple, DO;  Location: [facility]  INVASIVE CV LAB;  Service: Cardiovascular;  Laterality: N/A;   CATARACT EXTRACTION Right 2020   DILATION AND CURETTAGE OF UTERUS  2012   ablation    EYE SURGERY     Corneal growth removal on right eye   HYSTEROSCOPY WITH D & C N/A 01/03/2015   Procedure: DILATATION AND CURETTAGE /HYSTEROSCOPY;  Surgeon: Mary Evans, MD;  Location: [facility] ORS;  Service: Gynecology;  Laterality: N/A;   LAPAROSCOPY     for endometriosis   ROBOTIC ASSISTED TOTAL HYSTERECTOMY WITH BILATERAL SALPINGO OOPHERECTOMY N/A 03/17/2021   Procedure: XI ROBOTIC ASSISTED TOTAL HYSTERECTOMY WITH BILATERAL SALPINGO OOPHORECTOMY;  Surgeon: Mary Fila, MD;  Location: [facility] ORS;  Service: Gynecology;  Laterality: N/A;   SENTINEL NODE BIOPSY N/A 03/17/2021   Procedure: SENTINEL NODE BIOPSY;  Surgeon: Mary Fila, MD;  Location: [facility] ORS;  Service: Gynecology;  Laterality: N/A;   TONSILLECTOMY AND ADENOIDECTOMY      Family History  Problem Relation Age of Onset   Heart disease Mother 75       CABG   Stroke Father    Prostate cancer Father    Heart disease Brother 56       Transplant, died 10 years   Heart disease Maternal Grandmother    Heart disease Paternal Grandmother    Cancer Paternal Grandfather    Heart disease Brother 40       RF   Leukemia Brother    Leukemia Brother    Hypertension Sister    Cervical cancer Sister    Colon cancer Neg Hx    Breast cancer Neg Hx    Ovarian cancer Neg Hx    Endometrial cancer Neg Hx    Pancreatic cancer Neg Hx     Social History   Socioeconomic History   Marital status: Widowed    Spouse name: Mary Potter   Number of children: 2   Years of education: 14   Highest education level: Not on file  Occupational History   Occupation: retired    Comment: [employer]  Tobacco Use   Smoking status: Never   Smokeless tobacco: Never   Tobacco comments:    Never smoke 03/02/23  Vaping Use   Vaping Use: Never used  Substance and Sexual Activity   Alcohol use: No     Alcohol/week: 0.0 standard drinks of alcohol   Drug use: No   Sexual activity: Yes    Birth control/protection: Post-menopausal  Other Topics Concern   Not on file  Social History Narrative   Lives with her younger daughter, Mary Potter. Her older daughter lives in [place] with her family.  Social Determinants of Health   Financial Resource Strain: Not on file  Food Insecurity: Not on file  Transportation Needs: Not on file  Physical Activity: Not on file  Stress: Not on file  Social Connections: Not on file    Current Medications:  Current Outpatient Medications:    acetaminophen (TYLENOL) 500 MG tablet, Take 1,000 mg by mouth every 6 (six) hours as needed (Arthritis Pain)., Disp: , Rfl:    ALPRAZolam (XANAX) 0.5 MG tablet, Take 0.25 mg by mouth daily as needed (anxiousness)., Disp: , Rfl:    apixaban (ELIQUIS) 5 MG TABS tablet, Take 1 tablet (5 mg total) by mouth 2 (two) times daily., Disp: 180 tablet, Rfl: 3   Cholecalciferol (VITAMIN D) 50 MCG (2000 UT) CAPS, Take 2,000 Units by mouth at bedtime., Disp: , Rfl:    estradiol (VIVELLE-DOT) 0.025 MG/24HR, PLACE 1 PATCH ONTO THE SKIN 2 (TWO) TIMES A WEEK. SUNDAYS & WEDNESDAYS, Disp: 8 patch, Rfl: 0   metoprolol tartrate (LOPRESSOR) 50 MG tablet, Take 1 tablet (50 mg total) by mouth 2 (two) times daily., Disp: 180 tablet, Rfl: 3   olmesartan (BENICAR) 40 MG tablet, Take 40 mg by mouth daily., Disp: , Rfl:    Polyethyl Glycol-Propyl Glycol (SYSTANE OP), Place 1 drop into both eyes daily., Disp: , Rfl:    rosuvastatin (CRESTOR) 20 MG tablet, Take 1 tablet (20 mg total) by mouth every evening., Disp: 90 tablet, Rfl: 3  Review of Systems: + Joint pain Denies appetite changes, fevers, chills, fatigue, unexplained weight changes. Denies hearing loss, neck lumps or masses, mouth sores, ringing in ears or voice changes. Denies cough or wheezing.  Denies shortness of breath. Denies chest pain or palpitations. Denies leg swelling. Denies  abdominal distention, pain, blood in stools, constipation, diarrhea, nausea, vomiting, or early satiety. Denies pain with intercourse, dysuria, frequency, hematuria or incontinence. Denies hot flashes, pelvic pain, vaginal bleeding or vaginal discharge.   Denies back pain or muscle pain/cramps. Denies itching, rash, or wounds. Denies dizziness, headaches, numbness or seizures. Denies swollen lymph nodes or glands, denies easy bruising or bleeding. Denies anxiety, depression, confusion, or decreased concentration.  Physical Exam: BP (!) 148/81 (BP Location: Left Arm, Patient Position: Sitting)   Pulse 77   Temp 98.4 F (36.9 C)   Ht 5' 3.39" (1.61 m)   Wt 157 lb 3.2 oz (71.3 kg)   SpO2 100%   BMI 27.51 kg/m  General: Alert, oriented, no acute distress. HEENT: Normocephalic, atraumatic, sclera anicteric. Chest: Lungs clear to auscultation bilaterally without wheezes or rhonchi. Cardiovascular: Regular rate and rhythm, no murmurs. Abdomen: soft, nontender.  Normoactive bowel sounds.  No masses or hepatosplenomegaly appreciated.  Well-healed incisions. Extremities: Grossly normal range of motion.  Warm, well perfused.  No edema bilaterally. Skin: No rashes or lesions noted. Lymphatics: No cervical, supraclavicular, or inguinal adenopathy. GU: Normal appearing external genitalia without erythema, excoriation, or lesions.  Stable small caruncle.  Mildly atrophic vaginal mucosa, no lesions or masses seen, no discharge or bleeding.  Bimanual exam reveals cuff intact no nodularity or masses.  Rectovaginal exam confirms these findings.  Laboratory & Radiologic Studies: None new  Assessment & Plan: Mary Potter is a 77 y.o. 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 Definitive surgery in 03/2021. MMRp.   Patient is overall doing quite well and is NED on exam today.     Low-dose estrogen had been started in the setting of her cardiac symptoms.  She is now  doing  well from a cardiac standpoint after ablation last year and cardioversion earlier this year.  We discussed again the risks and benefits of continuing low-dose estrogen and at this time she would like to do so.   We discussed NCCN and SGO surveillance recommendations, which differ some after the first year.  We will plan on visits every 6 months.  We will alternate these visits between my office and her OB/GYN.  I have asked her to reach out to schedule a visit with her OB/GYN in 6 months.  I will see her back in 1 year.  We reviewed signs and symptoms that would be concerning for cancer recurrence.  20 minutes of total time was spent for this patient encounter, including preparation, face-to-face counseling with the patient and coordination of care, and documentation of the encounter.  Eugene Garnet, MD  Division of Gynecologic Oncology  Department of Obstetrics and Gynecology  [facility]

## 2023-04-29 NOTE — Patient Instructions (Signed)
It was good to see you today.  I do not see or feel any evidence of cancer recurrence on your exam.  Please schedule follow-up with your OB/GYN in 6 months.  Please call my clinic sometime after the new year to schedule visit to see me this time next year.  As always, if you develop any new and concerning symptoms before your next visit, please call to see me sooner.

## 2023-05-03 DIAGNOSIS — R0989 Other specified symptoms and signs involving the circulatory and respiratory systems: Secondary | ICD-10-CM | POA: Diagnosis not present

## 2023-05-18 DIAGNOSIS — Z961 Presence of intraocular lens: Secondary | ICD-10-CM | POA: Diagnosis not present

## 2023-05-18 DIAGNOSIS — H02834 Dermatochalasis of left upper eyelid: Secondary | ICD-10-CM | POA: Diagnosis not present

## 2023-05-18 DIAGNOSIS — H02831 Dermatochalasis of right upper eyelid: Secondary | ICD-10-CM | POA: Diagnosis not present

## 2023-05-18 DIAGNOSIS — H02421 Myogenic ptosis of right eyelid: Secondary | ICD-10-CM | POA: Diagnosis not present

## 2023-05-18 DIAGNOSIS — H57813 Brow ptosis, bilateral: Secondary | ICD-10-CM | POA: Diagnosis not present

## 2023-05-23 ENCOUNTER — Other Ambulatory Visit: Payer: Self-pay | Admitting: Cardiology

## 2023-06-01 ENCOUNTER — Other Ambulatory Visit (HOSPITAL_COMMUNITY): Payer: Self-pay

## 2023-06-24 ENCOUNTER — Encounter: Payer: Self-pay | Admitting: Obstetrics & Gynecology

## 2023-06-25 DIAGNOSIS — R8290 Unspecified abnormal findings in urine: Secondary | ICD-10-CM | POA: Diagnosis not present

## 2023-06-25 DIAGNOSIS — M62830 Muscle spasm of back: Secondary | ICD-10-CM | POA: Diagnosis not present

## 2023-06-25 DIAGNOSIS — M545 Low back pain, unspecified: Secondary | ICD-10-CM | POA: Diagnosis not present

## 2023-06-27 DIAGNOSIS — K8080 Other cholelithiasis without obstruction: Secondary | ICD-10-CM | POA: Diagnosis not present

## 2023-06-27 DIAGNOSIS — S32020A Wedge compression fracture of second lumbar vertebra, initial encounter for closed fracture: Secondary | ICD-10-CM | POA: Diagnosis not present

## 2023-06-27 DIAGNOSIS — F418 Other specified anxiety disorders: Secondary | ICD-10-CM | POA: Diagnosis not present

## 2023-06-27 DIAGNOSIS — M8000XA Age-related osteoporosis with current pathological fracture, unspecified site, initial encounter for fracture: Secondary | ICD-10-CM | POA: Diagnosis not present

## 2023-06-27 DIAGNOSIS — R0989 Other specified symptoms and signs involving the circulatory and respiratory systems: Secondary | ICD-10-CM | POA: Diagnosis not present

## 2023-06-27 DIAGNOSIS — M545 Low back pain, unspecified: Secondary | ICD-10-CM | POA: Diagnosis not present

## 2023-07-18 DIAGNOSIS — M4856XA Collapsed vertebra, not elsewhere classified, lumbar region, initial encounter for fracture: Secondary | ICD-10-CM | POA: Diagnosis not present

## 2023-07-21 DIAGNOSIS — K5900 Constipation, unspecified: Secondary | ICD-10-CM | POA: Diagnosis not present

## 2023-07-21 DIAGNOSIS — F418 Other specified anxiety disorders: Secondary | ICD-10-CM | POA: Diagnosis not present

## 2023-07-21 DIAGNOSIS — N390 Urinary tract infection, site not specified: Secondary | ICD-10-CM | POA: Diagnosis not present

## 2023-07-21 DIAGNOSIS — K8080 Other cholelithiasis without obstruction: Secondary | ICD-10-CM | POA: Diagnosis not present

## 2023-07-21 DIAGNOSIS — S32010A Wedge compression fracture of first lumbar vertebra, initial encounter for closed fracture: Secondary | ICD-10-CM | POA: Diagnosis not present

## 2023-07-21 DIAGNOSIS — M545 Low back pain, unspecified: Secondary | ICD-10-CM | POA: Diagnosis not present

## 2023-07-21 DIAGNOSIS — R0989 Other specified symptoms and signs involving the circulatory and respiratory systems: Secondary | ICD-10-CM | POA: Diagnosis not present

## 2023-07-21 DIAGNOSIS — R319 Hematuria, unspecified: Secondary | ICD-10-CM | POA: Diagnosis not present

## 2023-07-21 DIAGNOSIS — S32020D Wedge compression fracture of second lumbar vertebra, subsequent encounter for fracture with routine healing: Secondary | ICD-10-CM | POA: Diagnosis not present

## 2023-07-21 DIAGNOSIS — M8000XA Age-related osteoporosis with current pathological fracture, unspecified site, initial encounter for fracture: Secondary | ICD-10-CM | POA: Diagnosis not present

## 2023-07-21 DIAGNOSIS — R350 Frequency of micturition: Secondary | ICD-10-CM | POA: Diagnosis not present

## 2023-07-22 ENCOUNTER — Other Ambulatory Visit (HOSPITAL_COMMUNITY): Payer: Self-pay | Admitting: Family Medicine

## 2023-07-22 ENCOUNTER — Ambulatory Visit (HOSPITAL_COMMUNITY)
Admission: RE | Admit: 2023-07-22 | Discharge: 2023-07-22 | Disposition: A | Discharge status: Home / Self Care | Payer: Medicare Other | Source: Ambulatory Visit | Attending: Family Medicine | Admitting: Family Medicine

## 2023-07-22 DIAGNOSIS — M5126 Other intervertebral disc displacement, lumbar region: Secondary | ICD-10-CM | POA: Diagnosis not present

## 2023-07-22 DIAGNOSIS — M48061 Spinal stenosis, lumbar region without neurogenic claudication: Secondary | ICD-10-CM | POA: Diagnosis not present

## 2023-07-22 DIAGNOSIS — K8020 Calculus of gallbladder without cholecystitis without obstruction: Secondary | ICD-10-CM | POA: Diagnosis not present

## 2023-07-22 DIAGNOSIS — S32010A Wedge compression fracture of first lumbar vertebra, initial encounter for closed fracture: Secondary | ICD-10-CM | POA: Insufficient documentation

## 2023-07-22 DIAGNOSIS — M5136 Other intervertebral disc degeneration, lumbar region: Secondary | ICD-10-CM | POA: Diagnosis not present

## 2023-07-23 ENCOUNTER — Emergency Department (HOSPITAL_BASED_OUTPATIENT_CLINIC_OR_DEPARTMENT_OTHER)
Admission: EM | Admit: 2023-07-23 | Discharge: 2023-07-23 | Disposition: A | Discharge status: Home / Self Care | Payer: Medicare Other | Attending: Emergency Medicine | Admitting: Emergency Medicine

## 2023-07-23 ENCOUNTER — Encounter (HOSPITAL_BASED_OUTPATIENT_CLINIC_OR_DEPARTMENT_OTHER): Payer: Self-pay

## 2023-07-23 ENCOUNTER — Other Ambulatory Visit: Payer: Self-pay

## 2023-07-23 DIAGNOSIS — Z7901 Long term (current) use of anticoagulants: Secondary | ICD-10-CM | POA: Insufficient documentation

## 2023-07-23 DIAGNOSIS — T402X5A Adverse effect of other opioids, initial encounter: Secondary | ICD-10-CM

## 2023-07-23 DIAGNOSIS — K5900 Constipation, unspecified: Secondary | ICD-10-CM | POA: Diagnosis not present

## 2023-07-23 DIAGNOSIS — Z79899 Other long term (current) drug therapy: Secondary | ICD-10-CM | POA: Insufficient documentation

## 2023-07-23 DIAGNOSIS — I10 Essential (primary) hypertension: Secondary | ICD-10-CM | POA: Diagnosis not present

## 2023-07-23 LAB — COMPREHENSIVE METABOLIC PANEL
ALT: 11 U/L (ref 0–44)
AST: 14 U/L — ABNORMAL LOW (ref 15–41)
Albumin: 4.1 g/dL (ref 3.5–5.0)
Alkaline Phosphatase: 60 U/L (ref 38–126)
Anion gap: 9 (ref 5–15)
BUN: 13 mg/dL (ref 8–23)
CO2: 26 mmol/L (ref 22–32)
Calcium: 9.9 mg/dL (ref 8.9–10.3)
Chloride: 103 mmol/L (ref 98–111)
Creatinine, Ser: 0.61 mg/dL (ref 0.44–1.00)
GFR, Estimated: >60 mL/min (ref >60)
Glucose, Bld: 121 mg/dL — ABNORMAL HIGH (ref 70–99)
Potassium: 3.8 mmol/L (ref 3.5–5.1)
Sodium: 138 mmol/L (ref 135–145)
Total Bilirubin: 0.5 mg/dL (ref 0.3–1.2)
Total Protein: 7.8 g/dL (ref 6.5–8.1)

## 2023-07-23 LAB — CBC WITH DIFFERENTIAL/PLATELET
Abs Immature Granulocytes: 0.02 10*3/uL (ref 0.00–0.07)
Basophils Absolute: 0 10*3/uL (ref 0.0–0.1)
Basophils Relative: 0 %
Eosinophils Absolute: 0.1 10*3/uL (ref 0.0–0.5)
Eosinophils Relative: 1 %
HCT: 36.6 % (ref 36.0–46.0)
Hemoglobin: 12.1 g/dL (ref 12.0–15.0)
Immature Granulocytes: 0 %
Lymphocytes Relative: 14 %
Lymphs Abs: 1.1 10*3/uL (ref 0.7–4.0)
MCH: 29.6 pg (ref 26.0–34.0)
MCHC: 33.1 g/dL (ref 30.0–36.0)
MCV: 89.5 fL (ref 80.0–100.0)
Monocytes Absolute: 0.4 10*3/uL (ref 0.1–1.0)
Monocytes Relative: 5 %
Neutro Abs: 6.3 10*3/uL (ref 1.7–7.7)
Neutrophils Relative %: 80 %
Platelets: 296 10*3/uL (ref 150–400)
RBC: 4.09 MIL/uL (ref 3.87–5.11)
RDW: 14.4 % (ref 11.5–15.5)
WBC: 8 10*3/uL (ref 4.0–10.5)
nRBC: 0 % (ref 0.0–0.2)

## 2023-07-23 MED ORDER — FLEET ENEMA RE ENEM
1.0000 | ENEMA | Freq: Once | RECTAL | Status: AC
  Administered 2023-07-23: 1 via RECTAL
  Filled 2023-07-23: qty 1

## 2023-07-23 MED ORDER — FLEET ENEMA RE ENEM: 1.0000 | ENEMA | Freq: Once | RECTAL | Status: DC

## 2023-07-23 NOTE — ED Notes (Signed)
Dc instructions reviewed with patient. Patient voiced understanding. Dc with belongings.  °

## 2023-07-23 NOTE — ED Provider Notes (Signed)
[place] EMERGENCY DEPARTMENT AT [facility] Provider Note   CSN: [id number] Arrival date & time: 07/23/23  1120     History  Chief Complaint  Patient presents with   Constipation    Mary Potter is a 77 y.o. female past medical history of estrogen replacement, HLD, HTN, A-fib (AC with Eliquis) presents emergency department for evaluation of constipation and lower back pain since Sunday.  She recently started oxycodone on 07/21/23 q 6 hours with last taken this morning at 0900 following L1 and L2 compression fracture and has not been able to have a bowel movement since Sunday.  She has tried MiraLAX and colace since Monday without relief to constipation.  She denies urinary symptoms, saddle paresthesia, loss of urinary continence, fever.    Constipation Associated symptoms: back pain   Associated symptoms: no dysuria, no fever and no vomiting        Home Medications Prior to Admission medications   Medication Sig Start Date End Date Taking? Authorizing Provider  acetaminophen (TYLENOL) 500 MG tablet Take 1,000 mg by mouth every 6 (six) hours as needed (Arthritis Pain).    [provider]  ALPRAZolam Prudy Feeler) 0.5 MG tablet Take 0.25 mg by mouth daily as needed (anxiousness). 08/26/21   [provider]  apixaban (ELIQUIS) 5 MG TABS tablet Take 1 tablet (5 mg total) by mouth 2 (two) times daily. 03/02/23   Eustace Pen, PA-C  Cholecalciferol (VITAMIN D) 50 MCG (2000 UT) CAPS Take 2,000 Units by mouth at bedtime.    [provider]  estradiol (VIVELLE-DOT) 0.025 MG/24HR PLACE 1 PATCH ONTO THE SKIN 2 (TWO) TIMES A WEEK. SUNDAYS & [facility] 03/08/22   Warner Mccreedy D, NP  metoprolol tartrate (LOPRESSOR) 50 MG tablet TAKE ONE TABLET BY MOUTH TWICE A DAY 05/23/23   Rollene Rotunda, MD  olmesartan (BENICAR) 40 MG tablet Take 40 mg by mouth daily.    [provider]  Polyethyl Glycol-Propyl Glycol (SYSTANE OP) Place 1 drop into both eyes  daily.    [provider]  rosuvastatin (CRESTOR) 20 MG tablet Take 1 tablet (20 mg total) by mouth every evening. 05/17/22   Lanier Prude, MD      Allergies    Talwin [pentazocine], Hydrocodone, and Prednisone    Review of Systems   Review of Systems  Constitutional:  Negative for fever.  Gastrointestinal:  Positive for constipation. Negative for vomiting.  Genitourinary:  Negative for decreased urine volume, dysuria, hematuria and urgency.  Musculoskeletal:  Positive for back pain.    Physical Exam Updated Vital Signs BP (!) 141/71   Pulse 82   Temp 98.9 F (37.2 C) (Oral)   Resp 16   Ht 5' 3.5" (1.613 m)   Wt 70.3 kg   SpO2 95%   BMI 27.03 kg/m  Physical Exam Vitals and nursing note reviewed.  Constitutional:      General: She is not in acute distress.    Appearance: Normal appearance.  HENT:     Head: Normocephalic and atraumatic.  Eyes:     General: No scleral icterus.    Conjunctiva/sclera: Conjunctivae normal.  Cardiovascular:     Rate and Rhythm: Normal rate.     Pulses: Normal pulses.  Pulmonary:     Effort: Pulmonary effort is normal.     Breath sounds: Normal breath sounds.  Abdominal:     General: There is no distension.     Palpations: Abdomen is soft.     Tenderness: There  is abdominal tenderness (RLQ and LLQ). There is no right CVA tenderness, left CVA tenderness, guarding or rebound.  Musculoskeletal:        General: Normal range of motion.  Skin:    General: Skin is warm.     Capillary Refill: Capillary refill takes less than 2 seconds.     Coloration: Skin is not jaundiced or pale.  Neurological:     Mental Status: She is alert. Mental status is at baseline.     ED Results / Procedures / Treatments   Labs (all labs ordered are listed, but only abnormal results are displayed) Labs Reviewed  COMPREHENSIVE METABOLIC PANEL - Abnormal; Notable for the following components:      Result Value   Glucose, Bld 121 (*)    AST 14  (*)    All other components within normal limits  CBC WITH DIFFERENTIAL/PLATELET    EKG None  Radiology MR LUMBAR SPINE WO CONTRAST  Result Date: 07/22/2023 CLINICAL DATA:  Compression fracture of L1 vertebra, closed, initial encounter. EXAM: MRI LUMBAR SPINE WITHOUT CONTRAST TECHNIQUE: Multiplanar, multisequence MR imaging of the lumbar spine was performed. No intravenous contrast was administered. COMPARISON:  MRI lumbar spine 05/11/2018. FINDINGS: Segmentation: 5 non rib-bearing lumbar type vertebral bodies are present. The lowest fully formed vertebral body is L5. Alignment:  Grade 1 anterolisthesis measures 5 mm at L4-5. Vertebrae: Acute/subacute superior endplate fracture at L2 demonstrates 40% loss of height. Edema is present the upper half of the vertebral body. No significant retropulsed bone is present. An acute/subacute superior endplate fracture at L1 demonstrates 20% loss of height. Edema is present in the upper third of the vertebral body. Marrow signal and vertebral body heights are otherwise normal. Conus medullaris and cauda equina: Conus extends to the T12-L1 level. Conus and cauda equina appear normal. Paraspinal and other soft tissues: A 14 mm gallstone is noted. No inflammatory changes are present about the gallbladder. Kidneys are unremarkable. No significant adenopathy is present. Disc levels: T12-L1: A mild disc bulge is again noted. No focal protrusion or stenosis is present. L1-2: A broad-based disc protrusion is new. No significant stenosis is present. L2-3: Mild facet hypertrophy is present bilaterally. No significant stenosis is present. L3-4: Mild disc bulging and facet hypertrophy is present. No significant stenosis is present. L4-5: Moderate facet hypertrophy is again seen. Progressive anterolisthesis and uncovering of the disc protrusion is noted. This leads to moderate central canal stenosis with compression of the nerve roots. The foramina are patent bilaterally. L5-S1:  Normal disc signal and height is present. No focal protrusion or stenosis is present. IMPRESSION: 1. Acute/subacute superior endplate fractures at L1 and L2 with 20% and 20% loss of height respectively. No significant retropulsed bone. 2. Progressive anterolisthesis and uncovering of the disc protrusion at L4-5 with moderate central canal stenosis and compression of the nerve roots. 3. Mild disc bulging and facet hypertrophy at L2-3 and L3-4 without significant stenosis. 4. Cholelithiasis without evidence for cholecystitis. Electronically Signed   By: Marin Roberts M.D.   On: 07/22/2023 19:33    Procedures Procedures    Medications Ordered in ED Medications  sodium phosphate (FLEET) enema 1 enema (1 enema Rectal Given 07/23/23 1440)    ED Course/ Medical Decision Making/ A&P                                 Medical Decision Making Amount and/or Complexity of Data Reviewed Labs:  ordered.  Risk OTC drugs.   Upon evaluation, patient is unable to get comfortable secondary to back and rectal pain.  She has mild pain to LLQ and RLQ of abdomen with palpation likely related to constipation.  She denies saddle anesthesia, urinary symptoms, urinary incontinence making suspicion for cauda equina low.  She also had a lumbar spine MRI that showed normal conus and cauda equina.  Due to no concern of acute abdominal pathology, I do not believe imaging is warranted at this time. CBC and CMP are not significant for gross abnormality.  Performed digital disimpaction.  Patient reports that she was able to have a "normal bowel movement " but states that she "still feels like there is more in there" and would prefer to continue to an enema. Enema performed allowing full evacuation of bowels.  Following enema, she reports soreness to rectum but improvement of lower abdominal and low back pain.   Patient is stable for discharge.  Discussed with patient disposition plan and she understands and agrees with  plan.  Discussed to use previously prescribed tramadol for pain as she used this prior to learning about second compression fracture for her pain for 4 weeks and it did not cause constipation.  Continue Colace and MiraLAX and ensure adequate hydration.  Patient is to follow-up with Ortho regarding management of 2 lumbar compression fractures.  Discussed strict return precautions to ED to include but not limited to loss of urinary or bowel incontinence, saddle anesthesia, significant abdominal pain, inability have a bowel movement for greater than 5 days and associated fever.        Final Clinical Impression(s) / ED Diagnoses Final diagnoses:  Constipation due to opioid therapy    Rx / DC Orders ED Discharge Orders     None         Judithann Sheen, PA 07/23/23 1759    Alvira Monday, MD 07/23/23 2103

## 2023-07-23 NOTE — Discharge Instructions (Signed)
Thank you for letting us take care of you today.  Constipation is likely due to recent opioid therapy.  As you have not had constipation with tramadol I recommend that you use tramadol as prescribed as needed for pain.  Please continue with Colace and MiraLAX as you have been.  Make sure to continue to drink plenty of water  Return to emergency department if you experience abdominal pain, constipation and inability to defecate for a week, fever, loss of sensation to the genital region, urinary incontinence

## 2023-07-23 NOTE — ED Triage Notes (Signed)
Pt presents with constipation secondary to opioid use. Pt is currently being treated for compression fx to the lumbar area. Pt's last BM was 6 days ago and pt has associated rectal pressure. Pt was changed from tramadol to oxycodone this week. Pt has been taking Miralax and docusate without relief. Pt states this morning she had some liquid stool leak around the impaction.

## 2023-07-23 NOTE — ED Notes (Signed)
Pt disimpacted large amount of hard stool after fleets enema followed by soft stool.

## 2023-07-26 ENCOUNTER — Encounter (HOSPITAL_COMMUNITY): Payer: Medicare Other

## 2023-07-26 ENCOUNTER — Encounter (HOSPITAL_COMMUNITY): Payer: Self-pay

## 2023-07-27 DIAGNOSIS — Z6827 Body mass index (BMI) 27.0-27.9, adult: Secondary | ICD-10-CM | POA: Diagnosis not present

## 2023-07-27 DIAGNOSIS — S32010A Wedge compression fracture of first lumbar vertebra, initial encounter for closed fracture: Secondary | ICD-10-CM | POA: Diagnosis not present

## 2023-07-27 DIAGNOSIS — S32020A Wedge compression fracture of second lumbar vertebra, initial encounter for closed fracture: Secondary | ICD-10-CM | POA: Diagnosis not present

## 2023-08-04 ENCOUNTER — Inpatient Hospital Stay: Payer: Medicare Other | Attending: Nurse Practitioner | Admitting: Hematology

## 2023-08-04 ENCOUNTER — Inpatient Hospital Stay: Payer: Medicare Other

## 2023-08-04 VITALS — BP 157/95 | HR 118 | Temp 97.0°F | Resp 18 | Wt 153.8 lb

## 2023-08-04 DIAGNOSIS — M810 Age-related osteoporosis without current pathological fracture: Secondary | ICD-10-CM | POA: Diagnosis not present

## 2023-08-04 DIAGNOSIS — D472 Monoclonal gammopathy: Secondary | ICD-10-CM | POA: Insufficient documentation

## 2023-08-04 DIAGNOSIS — Z79899 Other long term (current) drug therapy: Secondary | ICD-10-CM | POA: Diagnosis not present

## 2023-08-04 LAB — CBC WITH DIFFERENTIAL (CANCER CENTER ONLY)
Abs Immature Granulocytes: 0.03 10*3/uL (ref 0.00–0.07)
Basophils Absolute: 0 10*3/uL (ref 0.0–0.1)
Basophils Relative: 0 %
Eosinophils Absolute: 0 10*3/uL (ref 0.0–0.5)
Eosinophils Relative: 1 %
HCT: 38.9 % (ref 36.0–46.0)
Hemoglobin: 12.7 g/dL (ref 12.0–15.0)
Immature Granulocytes: 0 %
Lymphocytes Relative: 17 %
Lymphs Abs: 1.5 10*3/uL (ref 0.7–4.0)
MCH: 29.5 pg (ref 26.0–34.0)
MCHC: 32.6 g/dL (ref 30.0–36.0)
MCV: 90.5 fL (ref 80.0–100.0)
Monocytes Absolute: 0.4 10*3/uL (ref 0.1–1.0)
Monocytes Relative: 4 %
Neutro Abs: 6.6 10*3/uL (ref 1.7–7.7)
Neutrophils Relative %: 78 %
Platelet Count: 338 10*3/uL (ref 150–400)
RBC: 4.3 MIL/uL (ref 3.87–5.11)
RDW: 14.4 % (ref 11.5–15.5)
WBC Count: 8.6 10*3/uL (ref 4.0–10.5)
nRBC: 0 % (ref 0.0–0.2)

## 2023-08-04 LAB — CMP (CANCER CENTER ONLY)
ALT: 14 U/L (ref 0–44)
AST: 16 U/L (ref 15–41)
Albumin: 4.3 g/dL (ref 3.5–5.0)
Alkaline Phosphatase: 73 U/L (ref 38–126)
Anion gap: 7 (ref 5–15)
BUN: 14 mg/dL (ref 8–23)
CO2: 27 mmol/L (ref 22–32)
Calcium: 10.2 mg/dL (ref 8.9–10.3)
Chloride: 103 mmol/L (ref 98–111)
Creatinine: 0.65 mg/dL (ref 0.44–1.00)
GFR, Estimated: >60 mL/min (ref >60)
Glucose, Bld: 110 mg/dL — ABNORMAL HIGH (ref 70–99)
Potassium: 4.1 mmol/L (ref 3.5–5.1)
Sodium: 137 mmol/L (ref 135–145)
Total Bilirubin: 0.4 mg/dL (ref 0.3–1.2)
Total Protein: 8 g/dL (ref 6.5–8.1)

## 2023-08-04 LAB — LACTATE DEHYDROGENASE: LDH: 104 U/L (ref 98–192)

## 2023-08-04 NOTE — Progress Notes (Signed)
HEMATOLOGY/ONCOLOGY CONSULTATION NOTE  Date of Service: 08/04/2023  Patient Care Team: Alysia Penna, MD as PCP - General (Internal Medicine) Rollene Rotunda, MD as PCP - Cardiology (Cardiology) Lanier Prude, MD as PCP - Electrophysiology (Cardiology) Sherian Rein, MD as Consulting Physician (Obstetrics and Gynecology) Key, Verita Schneiders, NP as Nurse Practitioner (Gynecology) Rollene Rotunda, MD as Consulting Physician (Cardiology)  CHIEF COMPLAINTS/PURPOSE OF CONSULTATION:  biclonal gammopathy evaluation  HISTORY OF PRESENTING ILLNESS:   Mary Potter is a wonderful 77 y.o. 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  Today, she is accompanied by her daughter and son-in-law. Patient has had two vertebral compression fractures in her L-spine. She reports that her initial back fracture was from pulling too hard while adjusting her shoes, and the second occurred after pushing on a trash can lid.   Her back pain continues to be bothersome, though it has improved. She did previously have mild pain raidating to her left lower extremity, which has resolved. Back brace does improve back pain.  The first time she had a compression fracture, she was told that she was developing the beginnings of osteopenia. Patient reports that she did not tolerate Fosamax and she stopped taking it. She has not had a bone density study recently. Her last bone density study from 2018 showed osteopenia. Patient has never been on prolia previously, though there are considerations to start it soon.   She reports having a muscle spasm on her left side, which is settling with muscle relaxants. She denies any leg swelling.   Patient reports a history of kidney stones, during which she had protein in her urine.  Patient has regularly been on vitamin D. She has been off of calcium for several years ago because of her fhx of  heart disease.   MEDICAL HISTORY:  Past Medical History:  Diagnosis Date   Atrial fibrillation (HCC)    Dysrhythmia    A-fib   Endometrial hyperplasia 01/03/2015   GERD (gastroesophageal reflux disease)    History of kidney stones    Hyperlipidemia    Hypertension    Pneumonia    PONV (postoperative nausea and vomiting)    slow to wake up   Thickened endometrium 01/03/2015   Vaginal delivery 1975, 1985    SURGICAL HISTORY: Past Surgical History:  Procedure Laterality Date   ANKLE FRACTURE SURGERY     ATRIAL FIBRILLATION ABLATION N/A 02/08/2022   Procedure: ATRIAL FIBRILLATION ABLATION;  Surgeon: Lanier Prude, MD;  Location: [facility];  Service: Cardiovascular;  Laterality: N/A;   CARDIOVERSION N/A 02/17/2023   Procedure: CARDIOVERSION;  Surgeon: Thomasene Ripple, DO;  Location: [facility];  Service: Cardiovascular;  Laterality: N/A;   CATARACT EXTRACTION Right 2020   DILATION AND CURETTAGE OF UTERUS  2012   ablation    EYE SURGERY     Corneal growth removal on right eye   HYSTEROSCOPY WITH D & C N/A 01/03/2015   Procedure: DILATATION AND CURETTAGE /HYSTEROSCOPY;  Surgeon: Shea Evans, MD;  Location: [facility];  Service: Gynecology;  Laterality: N/A;   LAPAROSCOPY     for endometriosis   ROBOTIC ASSISTED TOTAL HYSTERECTOMY WITH BILATERAL SALPINGO OOPHERECTOMY N/A 03/17/2021   Procedure: XI ROBOTIC ASSISTED TOTAL HYSTERECTOMY WITH BILATERAL SALPINGO OOPHORECTOMY;  Surgeon: Carver Fila, MD;  Location: [facility];  Service: Gynecology;  Laterality: N/A;   SENTINEL NODE BIOPSY N/A 03/17/2021   Procedure: Drucie Opitz  NODE BIOPSY;  Surgeon: Carver Fila, MD;  Location: [facility];  Service: Gynecology;  Laterality: N/A;   TONSILLECTOMY AND ADENOIDECTOMY      SOCIAL HISTORY: Social History   Socioeconomic History   Marital status: Widowed    Spouse name: [name]   Number of children: 2   Years of education: 14   Highest education level: Not on file  Occupational  History   Occupation: retired    Comment: [employer]  Tobacco Use   Smoking status: Never   Smokeless tobacco: Never   Tobacco comments:    Never smoke 03/02/23  Vaping Use   Vaping status: Never Used  Substance and Sexual Activity   Alcohol use: No    Alcohol/week: 0.0 standard drinks of alcohol   Drug use: No   Sexual activity: Yes    Birth control/protection: Post-menopausal  Other Topics Concern   Not on file  Social History Narrative   Lives with her younger daughter, [name]. Her older daughter lives in [place] with her family.   Social Determinants of Health   Financial Resource Strain: Not on file  Food Insecurity: Not on file  Transportation Needs: Not on file  Physical Activity: Not on file  Stress: Not on file  Social Connections: Not on file  Intimate Partner Violence: Not on file    FAMILY HISTORY: Family History  Problem Relation Age of Onset   Heart disease Mother 39       CABG   Stroke Father    Prostate cancer Father    Heart disease Brother 77       Transplant, died 10 years   Heart disease Maternal Grandmother    Heart disease Paternal Grandmother    Cancer Paternal Grandfather    Heart disease Brother 90       RF   Leukemia Brother    Leukemia Brother    Hypertension Sister    Cervical cancer Sister    Colon cancer Neg Hx    Breast cancer Neg Hx    Ovarian cancer Neg Hx    Endometrial cancer Neg Hx    Pancreatic cancer Neg Hx     ALLERGIES:  is allergic to talwin [pentazocine], hydrocodone, and prednisone.  MEDICATIONS:  Current Outpatient Medications  Medication Sig Dispense Refill   acetaminophen (TYLENOL) 500 MG tablet Take 1,000 mg by mouth every 6 (six) hours as needed (Arthritis Pain).     ALPRAZolam (XANAX) 0.5 MG tablet Take 0.25 mg by mouth daily as needed (anxiousness).     apixaban (ELIQUIS) 5 MG TABS tablet Take 1 tablet (5 mg total) by mouth 2 (two) times daily. 180 tablet 3   Cholecalciferol (VITAMIN D) 50 MCG (2000 UT)  CAPS Take 2,000 Units by mouth at bedtime.     estradiol (VIVELLE-DOT) 0.025 MG/24HR PLACE 1 PATCH ONTO THE SKIN 2 (TWO) TIMES A WEEK. SUNDAYS & WEDNESDAYS 8 patch 0   metoprolol tartrate (LOPRESSOR) 50 MG tablet TAKE ONE TABLET BY MOUTH TWICE A DAY 180 tablet 3   olmesartan (BENICAR) 40 MG tablet Take 40 mg by mouth daily.     Polyethyl Glycol-Propyl Glycol (SYSTANE OP) Place 1 drop into both eyes daily.     rosuvastatin (CRESTOR) 20 MG tablet Take 1 tablet (20 mg total) by mouth every evening. 90 tablet 3   No current facility-administered medications for this visit.    REVIEW OF SYSTEMS:    10 Point review of Systems was done is negative except as noted  above.  PHYSICAL EXAMINATION: ECOG PERFORMANCE STATUS: 2 - Symptomatic, <50% confined to bed  . Vitals:   08/04/23 1110  BP: (!) 157/95  Pulse: (!) 118  Resp: 18  Temp: (!) 97 F (36.1 C)  SpO2: 100%   Filed Weights   08/04/23 1110  Weight: 153 lb 12.8 oz (69.8 kg)   .Body mass index is 26.82 kg/m.  GENERAL:alert, in no acute distress and comfortable SKIN: no acute rashes, no significant lesions EYES: conjunctiva are pink and non-injected, sclera anicteric OROPHARYNX: MMM, no exudates, no oropharyngeal erythema or ulceration NECK: supple, no JVD LYMPH:  no palpable lymphadenopathy in the cervical, axillary or inguinal regions LUNGS: clear to auscultation b/l with normal respiratory effort HEART: regular rate & rhythm ABDOMEN:  normoactive bowel sounds , non tender, not distended.  No palpable hepatosplenomegaly Extremity: no pedal edema PSYCH: alert & oriented x 3 with fluent speech NEURO: no focal motor/sensory deficits  LABORATORY DATA:  I have reviewed the data as listed  .    Latest Ref Rng & Units 08/04/2023   12:40 PM 07/23/2023   11:49 AM 03/13/2023    4:11 PM  CBC  WBC 4.0 - 10.5 K/uL 8.6  8.0  7.2   Hemoglobin 12.0 - 15.0 g/dL 40.9  81.1  91.4   Hematocrit 36.0 - 46.0 % 38.9  36.6  36.0   Platelets  150 - 400 K/uL 338  296  243     .    Latest Ref Rng & Units 08/04/2023   12:40 PM 07/23/2023   11:49 AM 03/13/2023    4:11 PM  CMP  Glucose 70 - 99 mg/dL 782  956  213   BUN 8 - 23 mg/dL 14  13  20    Creatinine 0.44 - 1.00 mg/dL 0.86  5.78  4.69   Sodium 135 - 145 mmol/L 137  138  137   Potassium 3.5 - 5.1 mmol/L 4.1  3.8  3.7   Chloride 98 - 111 mmol/L 103  103  102   CO2 22 - 32 mmol/L 27  26  25    Calcium 8.9 - 10.3 mg/dL 62.9  9.9  9.4   Total Protein 6.5 - 8.1 g/dL 8.0  7.8    Total Bilirubin 0.3 - 1.2 mg/dL 0.4  0.5    Alkaline Phos 38 - 126 U/L 73  60    AST 15 - 41 U/L 16  14    ALT 0 - 44 U/L 14  11     IFE/PE, serum 07/21/2023:   RADIOGRAPHIC STUDIES: I have personally reviewed the radiological images as listed and agreed with the findings in the report. MR LUMBAR SPINE WO CONTRAST  Result Date: 07/22/2023 CLINICAL DATA:  Compression fracture of L1 vertebra, closed, initial encounter. EXAM: MRI LUMBAR SPINE WITHOUT CONTRAST TECHNIQUE: Multiplanar, multisequence MR imaging of the lumbar spine was performed. No intravenous contrast was administered. COMPARISON:  MRI lumbar spine 05/11/2018. FINDINGS: Segmentation: 5 non rib-bearing lumbar type vertebral bodies are present. The lowest fully formed vertebral body is L5. Alignment:  Grade 1 anterolisthesis measures 5 mm at L4-5. Vertebrae: Acute/subacute superior endplate fracture at L2 demonstrates 40% loss of height. Edema is present the upper half of the vertebral body. No significant retropulsed bone is present. An acute/subacute superior endplate fracture at L1 demonstrates 20% loss of height. Edema is present in the upper third of the vertebral body. Marrow signal and vertebral body heights are otherwise normal. Conus medullaris and cauda equina:  Conus extends to the T12-L1 level. Conus and cauda equina appear normal. Paraspinal and other soft tissues: A 14 mm gallstone is noted. No inflammatory changes are present about the  gallbladder. Kidneys are unremarkable. No significant adenopathy is present. Disc levels: T12-L1: A mild disc bulge is again noted. No focal protrusion or stenosis is present. L1-2: A broad-based disc protrusion is new. No significant stenosis is present. L2-3: Mild facet hypertrophy is present bilaterally. No significant stenosis is present. L3-4: Mild disc bulging and facet hypertrophy is present. No significant stenosis is present. L4-5: Moderate facet hypertrophy is again seen. Progressive anterolisthesis and uncovering of the disc protrusion is noted. This leads to moderate central canal stenosis with compression of the nerve roots. The foramina are patent bilaterally. L5-S1: Normal disc signal and height is present. No focal protrusion or stenosis is present. IMPRESSION: 1. Acute/subacute superior endplate fractures at L1 and L2 with 20% and 20% loss of height respectively. No significant retropulsed bone. 2. Progressive anterolisthesis and uncovering of the disc protrusion at L4-5 with moderate central canal stenosis and compression of the nerve roots. 3. Mild disc bulging and facet hypertrophy at L2-3 and L3-4 without significant stenosis. 4. Cholelithiasis without evidence for cholecystitis. Electronically Signed   By: Marin Roberts M.D.   On: 07/22/2023 19:33    ASSESSMENT & PLAN:  77 y.o. female with:  IgA kappa biclonal gammopathy  PLAN:  -recent CMP shows no hypercalcemia and kidney function is normal -no anemia -IFE/PE on 07/21/2023 showed two M spikes, one measuring 0.6 g/dL, and the other 0.3 g/dL -No obvious lesions or tumors in the bone in the areas she had compression fracture based on MRI suggesting that these fractures are likely related to osteoporosis. -she has compression fratures in her back at L1 and L2 about 20% loss of height each. There is also a pinched nerve at L4-5.  -less likely that patient has active myeloma -discussed details CRAB criteria for active myeloma.  Patient has no high clacium, no renal failure, no anemia, and bone tumors  were not suggested by MRI.  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 Recommend treating osteoporosis/osteopenia more aggressively -educated patient that with low-risk MGUS with igG protein, the risk of it progressing to active myeloma is 1-2% a year. With igA subtype, the risk of progression may be higher -educated patient that sometimes MGUS can be indirectly sympotmatic such as with neuropathy, or it cause kidney changes -recommend patient to stay UTD with age-appropriate vaccines to reduce the risk of infections -continue to follow with neurosurgery for management of vertebral compression fraction. There may be considerations of a vertebroplasty by interventional radiologist reduce the risk of progressive compression over time and pain management -her back pain is conservatively managed with back brace, oxycodone, Tylenol, and planning to start Prolia -discussed the need for dental clearance for Prolia. Discussed that Prolia could delay healing of any active dental issue -continue to follow with PCP to manage Prolia -will order blood tests to further characterize abnormal protein -will order 24/hour urine  -will order whole body skeletal survey -discussed option of bone marrow biopsy to exactly characterize the classification of plasma cell disorder, which is open to discussion.  -patient would like to hold having a bone marrow biopsy at this time, though she may consider it down the line -if labs show no significant concerns, we shall continue to monitor with labs in 3-4 months -answered all of patient's and her family  member's questions in detail  . Orders Placed This Encounter  Procedures   CBC with Differential (Cancer Center Only)    Standing Status:   Future    Number of Occurrences:   1    Standing Expiration Date:   08/03/2024   CMP (Cancer Center only)     Standing Status:   Future    Number of Occurrences:   1    Standing Expiration Date:   08/03/2024   Multiple Myeloma Panel (SPEP&IFE w/QIG)    Standing Status:   Future    Number of Occurrences:   1    Standing Expiration Date:   08/03/2024   Kappa/lambda light chains    Standing Status:   Future    Number of Occurrences:   1    Standing Expiration Date:   08/03/2024   Lactate dehydrogenase    Standing Status:   Future    Number of Occurrences:   1    Standing Expiration Date:   08/03/2024   Beta 2 microglobulin, serum    Standing Status:   Future    Number of Occurrences:   1    Standing Expiration Date:   08/03/2024   24-Hr Ur UPEP/UIFE/Light Chains/TP    Standing Status:   Future    Standing Expiration Date:   08/03/2024    FOLLOW-UP: Labs today WHole body skeletal survey in 1 week Phone visit with Dr Candise Che in 3 weeks  The total time spent in the appointment was 50 minutes* .  All of the patient's questions were answered with apparent satisfaction. The patient knows to call the clinic with any problems, questions or concerns.   Wyvonnia Lora MD MS AAHIVMS [facility] Hematology/Oncology Physician [facility]  .*Total Encounter Time as defined by the Centers for Medicare and Medicaid Services includes, in addition to the face-to-face time of a patient visit (documented in the note above) non-face-to-face time: obtaining and reviewing outside history, ordering and reviewing medications, tests or procedures, care coordination (communications with other health care professionals or caregivers) and documentation in the medical record.    I,Mitra Faeizi,acting as a Neurosurgeon for Wyvonnia Lora, MD.,have documented all relevant documentation on the behalf of Wyvonnia Lora, MD,as directed by  Wyvonnia Lora, MD while in the presence of Wyvonnia Lora, MD.  .I have reviewed the above documentation for accuracy and completeness, and I agree with the above. Johney Maine MD

## 2023-08-05 LAB — KAPPA/LAMBDA LIGHT CHAINS
Kappa free light chain: 218.8 mg/L — ABNORMAL HIGH (ref 3.3–19.4)
Kappa, lambda light chain ratio: 78.14 — ABNORMAL HIGH (ref 0.26–1.65)
Lambda free light chains: 2.8 mg/L — ABNORMAL LOW (ref 5.7–26.3)

## 2023-08-05 LAB — BETA 2 MICROGLOBULIN, SERUM: Beta-2 Microglobulin: 1.6 mg/L (ref 0.6–2.4)

## 2023-08-07 LAB — MULTIPLE MYELOMA PANEL, SERUM
Albumin SerPl Elph-Mcnc: 3.8 g/dL (ref 2.9–4.4)
Albumin/Glob SerPl: 1.1 (ref 0.7–1.7)
Alpha 1: 0.3 g/dL (ref 0.0–0.4)
Alpha2 Glob SerPl Elph-Mcnc: 0.9 g/dL (ref 0.4–1.0)
B-Globulin SerPl Elph-Mcnc: 1.1 g/dL (ref 0.7–1.3)
Gamma Glob SerPl Elph-Mcnc: 1.4 g/dL (ref 0.4–1.8)
Globulin, Total: 3.7 g/dL (ref 2.2–3.9)
IgA: 1402 mg/dL — ABNORMAL HIGH (ref 64–422)
IgG (Immunoglobin G), Serum: 597 mg/dL (ref 586–1602)
IgM (Immunoglobulin M), Srm: 22 mg/dL — ABNORMAL LOW (ref 26–217)
M Protein SerPl Elph-Mcnc: 0.8 g/dL — ABNORMAL HIGH
Total Protein ELP: 7.5 g/dL (ref 6.0–8.5)

## 2023-08-08 DIAGNOSIS — M810 Age-related osteoporosis without current pathological fracture: Secondary | ICD-10-CM | POA: Diagnosis not present

## 2023-08-08 DIAGNOSIS — Z79899 Other long term (current) drug therapy: Secondary | ICD-10-CM | POA: Diagnosis not present

## 2023-08-08 DIAGNOSIS — D472 Monoclonal gammopathy: Secondary | ICD-10-CM | POA: Diagnosis not present

## 2023-08-11 LAB — UPEP/UIFE/LIGHT CHAINS/TP, 24-HR UR
% BETA, Urine: 28.6 %
ALPHA 1 URINE: 7.6 %
Albumin, U: 33.2 %
Alpha 2, Urine: 8.9 %
Free Kappa Lt Chains,Ur: 51.88 mg/L (ref 1.17–86.46)
Free Kappa/Lambda Ratio: >75.19 — ABNORMAL HIGH (ref 1.83–14.26)
Free Lambda Lt Chains,Ur: <0.69 mg/L (ref 0.27–15.21)
GAMMA GLOBULIN URINE: 21.7 %
M-SPIKE %, Urine: 7.4 % — ABNORMAL HIGH
M-Spike, Mg/24 Hr: 11 mg/(24.h) — ABNORMAL HIGH
Total Protein, Urine-Ur/day: 155 mg/(24.h) — ABNORMAL HIGH (ref 30–150)
Total Protein, Urine: 8.6 mg/dL
Total Volume: 1800

## 2023-08-12 ENCOUNTER — Other Ambulatory Visit (HOSPITAL_COMMUNITY): Payer: Self-pay

## 2023-08-15 ENCOUNTER — Ambulatory Visit (HOSPITAL_COMMUNITY)
Admission: RE | Admit: 2023-08-15 | Discharge: 2023-08-15 | Disposition: A | Discharge status: Home / Self Care | Payer: Medicare Other | Source: Ambulatory Visit | Attending: Hematology | Admitting: Hematology

## 2023-08-15 DIAGNOSIS — D472 Monoclonal gammopathy: Secondary | ICD-10-CM | POA: Insufficient documentation

## 2023-08-16 ENCOUNTER — Ambulatory Visit (HOSPITAL_COMMUNITY)
Admission: RE | Admit: 2023-08-16 | Discharge: 2023-08-16 | Disposition: A | Discharge status: Home / Self Care | Payer: Medicare Other | Source: Ambulatory Visit | Attending: Internal Medicine | Admitting: Internal Medicine

## 2023-08-16 DIAGNOSIS — M810 Age-related osteoporosis without current pathological fracture: Secondary | ICD-10-CM | POA: Insufficient documentation

## 2023-08-16 MED ORDER — DENOSUMAB 60 MG/ML ~~LOC~~ SOSY
PREFILLED_SYRINGE | SUBCUTANEOUS | Status: AC
  Filled 2023-08-16: qty 1

## 2023-08-16 MED ORDER — DENOSUMAB 60 MG/ML ~~LOC~~ SOSY
60.0000 mg | PREFILLED_SYRINGE | Freq: Once | SUBCUTANEOUS | Status: AC
  Administered 2023-08-16: 60 mg via SUBCUTANEOUS

## 2023-08-19 ENCOUNTER — Other Ambulatory Visit: Payer: Self-pay

## 2023-08-19 ENCOUNTER — Encounter (HOSPITAL_COMMUNITY): Payer: Self-pay

## 2023-08-19 ENCOUNTER — Emergency Department (HOSPITAL_COMMUNITY): Payer: Medicare Other

## 2023-08-19 ENCOUNTER — Emergency Department (HOSPITAL_COMMUNITY)
Admission: EM | Admit: 2023-08-19 | Discharge: 2023-08-19 | Disposition: A | Discharge status: Home / Self Care | Payer: Medicare Other | Attending: Emergency Medicine | Admitting: Emergency Medicine

## 2023-08-19 DIAGNOSIS — Z7901 Long term (current) use of anticoagulants: Secondary | ICD-10-CM | POA: Insufficient documentation

## 2023-08-19 DIAGNOSIS — M4805 Spinal stenosis, thoracolumbar region: Secondary | ICD-10-CM | POA: Diagnosis not present

## 2023-08-19 DIAGNOSIS — M48061 Spinal stenosis, lumbar region without neurogenic claudication: Secondary | ICD-10-CM | POA: Diagnosis not present

## 2023-08-19 DIAGNOSIS — M4856XA Collapsed vertebra, not elsewhere classified, lumbar region, initial encounter for fracture: Secondary | ICD-10-CM | POA: Diagnosis not present

## 2023-08-19 DIAGNOSIS — M5459 Other low back pain: Secondary | ICD-10-CM | POA: Diagnosis not present

## 2023-08-19 DIAGNOSIS — M545 Low back pain, unspecified: Secondary | ICD-10-CM | POA: Insufficient documentation

## 2023-08-19 DIAGNOSIS — I10 Essential (primary) hypertension: Secondary | ICD-10-CM | POA: Insufficient documentation

## 2023-08-19 DIAGNOSIS — Z79899 Other long term (current) drug therapy: Secondary | ICD-10-CM | POA: Diagnosis not present

## 2023-08-19 DIAGNOSIS — R000 Tachycardia, unspecified: Secondary | ICD-10-CM | POA: Diagnosis not present

## 2023-08-19 DIAGNOSIS — M549 Dorsalgia, unspecified: Secondary | ICD-10-CM | POA: Diagnosis not present

## 2023-08-19 DIAGNOSIS — R29890 Loss of height: Secondary | ICD-10-CM | POA: Diagnosis not present

## 2023-08-19 LAB — CBC WITH DIFFERENTIAL/PLATELET
Abs Immature Granulocytes: 0.04 10*3/uL (ref 0.00–0.07)
Basophils Absolute: 0 10*3/uL (ref 0.0–0.1)
Basophils Relative: 0 %
Eosinophils Absolute: 0 10*3/uL (ref 0.0–0.5)
Eosinophils Relative: 0 %
HCT: 36.6 % (ref 36.0–46.0)
Hemoglobin: 11.9 g/dL — ABNORMAL LOW (ref 12.0–15.0)
Immature Granulocytes: 1 %
Lymphocytes Relative: 14 %
Lymphs Abs: 1.2 10*3/uL (ref 0.7–4.0)
MCH: 29.3 pg (ref 26.0–34.0)
MCHC: 32.5 g/dL (ref 30.0–36.0)
MCV: 90.1 fL (ref 80.0–100.0)
Monocytes Absolute: 0.5 10*3/uL (ref 0.1–1.0)
Monocytes Relative: 6 %
Neutro Abs: 6.8 10*3/uL (ref 1.7–7.7)
Neutrophils Relative %: 79 %
Platelets: 262 10*3/uL (ref 150–400)
RBC: 4.06 MIL/uL (ref 3.87–5.11)
RDW: 14.5 % (ref 11.5–15.5)
WBC: 8.5 10*3/uL (ref 4.0–10.5)
nRBC: 0 % (ref 0.0–0.2)

## 2023-08-19 LAB — URINALYSIS, ROUTINE W REFLEX MICROSCOPIC
Bilirubin Urine: NEGATIVE
Glucose, UA: NEGATIVE mg/dL
Ketones, ur: NEGATIVE mg/dL
Leukocytes,Ua: NEGATIVE
Nitrite: NEGATIVE
Protein, ur: NEGATIVE mg/dL
Specific Gravity, Urine: 1.006 (ref 1.005–1.030)
pH: 5 (ref 5.0–8.0)

## 2023-08-19 LAB — BASIC METABOLIC PANEL
Anion gap: 12 (ref 5–15)
BUN: 10 mg/dL (ref 8–23)
CO2: 21 mmol/L — ABNORMAL LOW (ref 22–32)
Calcium: 8.4 mg/dL — ABNORMAL LOW (ref 8.9–10.3)
Chloride: 104 mmol/L (ref 98–111)
Creatinine, Ser: 0.71 mg/dL (ref 0.44–1.00)
GFR, Estimated: >60 mL/min (ref >60)
Glucose, Bld: 134 mg/dL — ABNORMAL HIGH (ref 70–99)
Potassium: 3.8 mmol/L (ref 3.5–5.1)
Sodium: 137 mmol/L (ref 135–145)

## 2023-08-19 MED ORDER — FENTANYL CITRATE PF 50 MCG/ML IJ SOSY
25.0000 ug | PREFILLED_SYRINGE | Freq: Once | INTRAMUSCULAR | Status: AC
  Administered 2023-08-19: 25 ug via INTRAVENOUS
  Filled 2023-08-19: qty 1

## 2023-08-19 MED ORDER — LIDOCAINE 4 % EX PTCH: 1.0000 | MEDICATED_PATCH | CUTANEOUS | 0 refills | Status: DC

## 2023-08-19 MED ORDER — CYCLOBENZAPRINE HCL 10 MG PO TABS
10.0000 mg | ORAL_TABLET | Freq: Once | ORAL | Status: AC
  Administered 2023-08-19: 10 mg via ORAL
  Filled 2023-08-19: qty 1

## 2023-08-19 MED ORDER — CYCLOBENZAPRINE HCL 10 MG PO TABS: 10.0000 mg | ORAL_TABLET | Freq: Two times a day (BID) | ORAL | 0 refills | Status: DC | PRN

## 2023-08-19 MED ORDER — METOPROLOL TARTRATE 25 MG PO TABS
50.0000 mg | ORAL_TABLET | Freq: Once | ORAL | Status: AC
  Administered 2023-08-19: 50 mg via ORAL
  Filled 2023-08-19: qty 2

## 2023-08-19 MED ORDER — APIXABAN 5 MG PO TABS: 5.0000 mg | ORAL_TABLET | Freq: Two times a day (BID) | ORAL | Status: DC

## 2023-08-19 NOTE — ED Provider Notes (Signed)
[facility] AT [facility] Provider Note   CSN: [id number] Arrival date & time: 08/19/23  4098     History  Chief Complaint  Patient presents with   Back Pain    Mary Potter is a 77 y.o. female with a past medical history of A-fib on Eliquis, GERD, hyperlipidemia, hypertension, recent acute/subacute superior endplate fracture at L1-L2 presents today for evaluation of back pain.  Patient reports that her back pain has gotten worse in the last couple of days.  Denies any recent fall or direct injury to her back.  Patient reports the reason she had 2 lumbar fracture back in August and September when she was changing her bed. Patient states that she has had increased difficulty with ambulation and going to the bathroom due to back pain.  She denies any urinary retention, bowel incontinence, saddle anesthesia, distal weakness.  Denies any fever, cold/chills.  She took a dose of oxycodone and muscle relaxant this morning which helped with the pain.  She is seeing Dr. Jordan Likes neurosurgery who recommended conservative treatment at this point.   Back Pain   Past Medical History:  Diagnosis Date   Atrial fibrillation (HCC)    Dysrhythmia    A-fib   Endometrial hyperplasia 01/03/2015   GERD (gastroesophageal reflux disease)    History of kidney stones    Hyperlipidemia    Hypertension    Pneumonia    PONV (postoperative nausea and vomiting)    slow to wake up   Thickened endometrium 01/03/2015   Vaginal delivery 1976, 1985   Past Surgical History:  Procedure Laterality Date   ANKLE FRACTURE SURGERY     ATRIAL FIBRILLATION ABLATION N/A 02/08/2022   Procedure: ATRIAL FIBRILLATION ABLATION;  Surgeon: Lanier Prude, MD;  Location: [facility] INVASIVE CV LAB;  Service: Cardiovascular;  Laterality: N/A;   CARDIOVERSION N/A 02/17/2023   Procedure: CARDIOVERSION;  Surgeon: Thomasene Ripple, DO;  Location: [facility] INVASIVE CV LAB;  Service: Cardiovascular;  Laterality: N/A;    CATARACT EXTRACTION Right 2020   DILATION AND CURETTAGE OF UTERUS  2012   ablation    EYE SURGERY     Corneal growth removal on right eye   HYSTEROSCOPY WITH D & C N/A 01/03/2015   Procedure: DILATATION AND CURETTAGE /HYSTEROSCOPY;  Surgeon: Shea Evans, MD;  Location: [facility] ORS;  Service: Gynecology;  Laterality: N/A;   LAPAROSCOPY     for endometriosis   ROBOTIC ASSISTED TOTAL HYSTERECTOMY WITH BILATERAL SALPINGO OOPHERECTOMY N/A 03/17/2021   Procedure: XI ROBOTIC ASSISTED TOTAL HYSTERECTOMY WITH BILATERAL SALPINGO OOPHORECTOMY;  Surgeon: Carver Fila, MD;  Location: [facility] ORS;  Service: Gynecology;  Laterality: N/A;   SENTINEL NODE BIOPSY N/A 03/17/2021   Procedure: SENTINEL NODE BIOPSY;  Surgeon: Carver Fila, MD;  Location: [facility] ORS;  Service: Gynecology;  Laterality: N/A;   TONSILLECTOMY AND ADENOIDECTOMY       Home Medications Prior to Admission medications   Medication Sig Start Date End Date Taking? Authorizing Provider  acetaminophen (TYLENOL) 500 MG tablet Take 1,000 mg by mouth every 6 (six) hours as needed (Arthritis Pain).    [provider]  ALPRAZolam Prudy Feeler) 0.5 MG tablet Take 0.25 mg by mouth daily as needed (anxiousness). 08/26/21   [provider]  apixaban (ELIQUIS) 5 MG TABS tablet Take 1 tablet (5 mg total) by mouth 2 (two) times daily. 03/02/23   Eustace Pen, PA-C  Cholecalciferol (VITAMIN D) 50 MCG (2000 UT) CAPS Take 2,000 Units by mouth  at bedtime.    [provider]  estradiol (VIVELLE-DOT) 0.025 MG/24HR PLACE 1 PATCH ONTO THE SKIN 2 (TWO) TIMES A WEEK. SUNDAYS & [facility] 03/08/22   Warner Mccreedy D, NP  methocarbamol (ROBAXIN) 500 MG tablet Take 500 mg by mouth every 8 (eight) hours as needed.    [provider]  metoprolol tartrate (LOPRESSOR) 50 MG tablet TAKE ONE TABLET BY MOUTH TWICE A DAY 05/23/23   Rollene Rotunda, MD  olmesartan (BENICAR) 40 MG tablet Take 40 mg by mouth daily.    [provider]   oxyCODONE (OXY IR/ROXICODONE) 5 MG immediate release tablet Take 5 mg by mouth every 8 (eight) hours as needed for moderate pain. 07/21/23   [provider]  Polyethyl Glycol-Propyl Glycol (SYSTANE OP) Place 1 drop into both eyes daily.    [provider]  rosuvastatin (CRESTOR) 20 MG tablet Take 1 tablet (20 mg total) by mouth every evening. 05/17/22   Lanier Prude, MD      Allergies    Talwin [pentazocine], Hydrocodone, and Prednisone    Review of Systems   Review of Systems  Musculoskeletal:  Positive for back pain.    Physical Exam Updated Vital Signs BP (!) 147/80   Pulse (!) 117   Temp 97.6 F (36.4 C)   Resp 12   Ht 5\' 3"  (1.6 m)   Wt 66.2 kg   SpO2 100%   BMI 25.86 kg/m  Physical Exam Vitals and nursing note reviewed.  Constitutional:      Appearance: Normal appearance.  HENT:     Head: Normocephalic and atraumatic.     Mouth/Throat:     Mouth: Mucous membranes are moist.  Eyes:     General: No scleral icterus. Cardiovascular:     Rate and Rhythm: Normal rate and regular rhythm.     Pulses: Normal pulses.     Heart sounds: Normal heart sounds.  Pulmonary:     Effort: Pulmonary effort is normal.     Breath sounds: Normal breath sounds.  Abdominal:     General: Abdomen is flat.     Palpations: Abdomen is soft.     Tenderness: There is no abdominal tenderness.  Musculoskeletal:        General: No deformity.     Comments: Tenderness to palpation to lumbar spine.  Skin:    General: Skin is warm.     Findings: No rash.  Neurological:     General: No focal deficit present.     Mental Status: She is alert.  Psychiatric:        Mood and Affect: Mood normal.     ED Results / Procedures / Treatments   Labs (all labs ordered are listed, but only abnormal results are displayed) Labs Reviewed - No data to display  EKG None  Radiology No results found.  Procedures Procedures    Medications Ordered in ED Medications - No data  to display  ED Course/ Medical Decision Making/ A&P Clinical Course as of 08/19/23 1612  Fri Aug 19, 2023  1409 Lymphs Abs: 1.2 [KL]    Clinical Course User Index [KL] Jeanelle Malling, PA                                 Medical Decision Making Amount and/or Complexity of Data Reviewed Labs: ordered. Decision-making details documented in ED Course. Radiology: ordered.  Risk OTC drugs. Prescription drug management.  This patient presents to the ED for back pain, this involves an extensive number of treatment options, and is a complaint that carries with a high risk of complications and morbidity.  The differential diagnosis includes fracture, dislocation, spinal stenosis, radiculopathy, herniated disc, cauda equina.  This is not an exhaustive list.  Imaging studies: I ordered imaging studies, personally reviewed, interpreted imaging and agree with the radiologist's interpretations. The results include: CT lumbar spine showed slight progression of height loss at L1 and lumbar spinal stenosis.  Problem list/ ED course/ Critical interventions/ Medical management: HPI: See above Vital signs within normal range and stable throughout visit. Laboratory/imaging studies significant for: See above. On physical examination, patient is afebrile and appears in no acute distress.  CT lumbar spine showed slight progression of height loss at L1 and spinal stenosis at multiple level of the lumbar spine.  Unlikely cauda equina, patient has no urinary retention, bowel incontinence, saddle anesthesia, distal weakness.  Given fentanyl x 2 for pain and muscle relaxant x 1.  Patient was able to stand and ambulate with some difficulty.  Will consult neurosurgery.  Given patient's ability to ambulate and improvement with pain control medications, will discharge patient home at this point with neurosurgery follow-up.  Strict ED return precaution discussed.  Patient and family at bedside are agreeable to the plan. I  have reviewed the patient home medicines and have made adjustments as needed.  Cardiac monitoring/EKG: The patient was maintained on a cardiac monitor.  I personally reviewed and interpreted the cardiac monitor which showed an underlying rhythm of: sinus rhythm.  Additional history obtained: External records from outside source obtained and reviewed including: Chart review including previous notes, labs, imaging.  Consultations obtained: I spoke to Dr. Danielle Dess neurosurgery.  He recommended outpatient follow-up with Dr. Jordan Likes, possible elective kyphoplasty,  if pain is controlled in the ER.  Disposition Continued outpatient therapy. Follow-up with neurosurgery recommended for reevaluation of symptoms. Treatment plan discussed with patient.  Pt acknowledged understanding was agreeable to the plan. Worrisome signs and symptoms were discussed with patient, and patient acknowledged understanding to return to the ED if they noticed these signs and symptoms. Patient was stable upon discharge.   This chart was dictated using voice recognition software.  Despite best efforts to proofread,  errors can occur which can change the documentation meaning.          Final Clinical Impression(s) / ED Diagnoses Final diagnoses:  Acute midline low back pain without sciatica    Rx / DC Orders ED Discharge Orders          Ordered    lidocaine (HM LIDOCAINE PATCH) 4 %  Every 24 hours        08/19/23 1512    cyclobenzaprine (FLEXERIL) 10 MG tablet  2 times daily PRN        08/19/23 1512              Jeanelle Malling, [place] 08/19/23 1620    Wynetta Fines, MD 08/20/23 1252

## 2023-08-19 NOTE — ED Triage Notes (Addendum)
Patient BIB EMS from coming home. No falls or injuries. Unknown reason why patient keep having compression fx to lumbar area. Patient feels like she has another compression fx to lumbar area this Tuesday without injury. No neuro issues. CBG 194, HR 118, 171/91,97%RA, 24 20gRAC

## 2023-08-19 NOTE — Discharge Instructions (Addendum)
Please take oxycodone and methocarbamol or flexeril and use lidocaine patch for pain. I recommend close follow-up with neurosurgery Dr. Jordan Likes for reevaluation.  Please do not hesitate to return to emergency department if worrisome signs symptoms we discussed become apparent.

## 2023-08-22 ENCOUNTER — Other Ambulatory Visit: Payer: Medicare Other

## 2023-08-22 ENCOUNTER — Encounter: Payer: Medicare Other | Admitting: Nurse Practitioner

## 2023-08-22 NOTE — Plan of Care (Signed)
[facility] Tonsillectomy/Adenoidectomy, Postoperative PEDS care plan entered in error.

## 2023-08-25 ENCOUNTER — Telehealth: Payer: Self-pay

## 2023-08-25 DIAGNOSIS — S32020A Wedge compression fracture of second lumbar vertebra, initial encounter for closed fracture: Secondary | ICD-10-CM | POA: Diagnosis not present

## 2023-08-25 DIAGNOSIS — S32010A Wedge compression fracture of first lumbar vertebra, initial encounter for closed fracture: Secondary | ICD-10-CM | POA: Diagnosis not present

## 2023-08-25 NOTE — Telephone Encounter (Signed)
Pre-operative Risk Assessment    Patient Name: Mary Potter  DOB: [birth year] MRN: [id number]   Last office visit 03/30/23 with Dr. Antoine Poche   Upcoming 09/12/23 with Dr. Steffanie Dunn   Request for Surgical Clearance    Procedure:  L1, L2 Kyphoplasty   Date of Surgery:  Clearance TBD                                 Surgeon:  Dr. Julio Sicks Surgeon's Group or Practice Name:  [facility]  Phone number:  [phone] Fax number:  [phone]   Type of Clearance Requested:   - Pharmacy:  Hold Apixaban (Eliquis)     Type of Anesthesia:  General    Additional requests/questions:    Scarlette Shorts   08/25/2023, 2:07 PM

## 2023-08-26 ENCOUNTER — Telehealth: Payer: Self-pay

## 2023-08-26 NOTE — Telephone Encounter (Signed)
Transition Care Management Follow-up Telephone Call Date of discharge and from where: [facility] 9/21 How have you been since you were released from the hospital? Still having concerns and following up with providers Any questions or concerns? No  Items Reviewed: Did the pt receive and understand the discharge instructions provided? Yes  Medications obtained and verified? No  Other? No  Any new allergies since your discharge? No  Dietary orders reviewed? No Do you have support at home? Yes     Follow up appointments reviewed:  PCP Hospital f/u appt confirmed? No  Scheduled to see  on  @ . Specialist Hospital f/u appt confirmed? Yes  Scheduled to see  on @ . Are transportation arrangements needed? No  If their condition worsens, is the pt aware to call PCP or go to the Emergency Dept.? Yes Was the patient provided with contact information for the PCP's office or ED? Yes Was to pt encouraged to call back with questions or concerns? Yes

## 2023-08-29 ENCOUNTER — Inpatient Hospital Stay (HOSPITAL_BASED_OUTPATIENT_CLINIC_OR_DEPARTMENT_OTHER): Payer: Medicare Other | Admitting: Hematology

## 2023-08-29 DIAGNOSIS — Z79899 Other long term (current) drug therapy: Secondary | ICD-10-CM | POA: Diagnosis not present

## 2023-08-29 DIAGNOSIS — D472 Monoclonal gammopathy: Secondary | ICD-10-CM | POA: Diagnosis not present

## 2023-08-29 DIAGNOSIS — M810 Age-related osteoporosis without current pathological fracture: Secondary | ICD-10-CM | POA: Diagnosis not present

## 2023-08-29 NOTE — Telephone Encounter (Signed)
Patient with diagnosis of afib on Eliquis for anticoagulation.    Procedure: L1, L2 kyphoplasty Date of procedure: TBD  CHA2DS2-VASc Score = 5  This indicates a 7.2% annual risk of stroke. The patient's score is based upon: CHF History: 0 HTN History: 1 Diabetes History: 0 Stroke History: 0 Vascular Disease History: 1 Age Score: 2 Gender Score: 1   CrCl 73mL/min Platelet count 262K  Per office protocol, patient can hold Eliquis for 3 days prior to procedure.    **This guidance is not considered finalized until pre-operative APP has relayed final recommendations.**

## 2023-08-29 NOTE — Telephone Encounter (Signed)
Patient Name: Mary Potter  DOB: 06-28-1946 MRN: [id number]  Primary Cardiologist: Rollene Rotunda, MD  Clinical pharmacists have reviewed the patient's past medical history, labs, and current medications as part of preoperative protocol coverage. The following recommendations have been made:  Per office protocol, patient can hold Eliquis for 3 days prior to procedure.   I will route this recommendation to the requesting party via Epic fax function and remove from pre-op pool.  Please call with questions.  Napoleon Form, Leodis Rains, NP 08/29/2023, 10:51 AM

## 2023-08-29 NOTE — Progress Notes (Signed)
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  Today, she is accompanied by her daughter and son-in-law. Patient has had two vertebral compression fractures in her L-spine. She reports that her initial back fracture was from pulling too hard while adjusting her shoes, and the second occurred after pushing on a trash can lid.   Her back pain continues to be bothersome, though it has improved. She did previously have mild pain raidating to her left lower extremity, which has resolved. Back brace does improve back pain.  The first time she had a compression fracture, she was told that she was developing the beginnings of osteopenia. Patient reports that she did not tolerate Fosamax and she stopped taking it. She has not had a bone density study recently. Her last bone density study from 2018 showed osteopenia. Patient has never been on prolia previously, though there are considerations to start it soon.   She reports having a muscle spasm on her left side, which is settling with muscle relaxants. She denies any leg swelling.   Patient reports a history of kidney stones, during which she had protein in her urine.  Patient has regularly been on vitamin D. She has been off of calcium for several years ago because of her fhx  of heart disease.   INTERVAL HISTORY: Mary Potter is a wonderful 77 y.o. 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 Patient was initially seen by me on 08/04/2023.   .I connected with Mary Potter on 08/29/2023 at  3:20 PM EDT by telephone visit and verified that I am speaking with the correct person using two identifiers.   Patient notes she has been doing well overall since our last visit. Patient notes that she has been following-up with her Neurologist, who has recommended vertebroplasty. She is waiting on a call from her neurologist or schedule her surgery.   She denies any new infection issues, fever, chills, night sweats, bone pain, chest pain, fatigue, abdominal pain, or leg swelling.   Patient notes she has started receiving her Prolia shot, around 2 weeks ago.   She has never had bone density study.    I discussed the limitations, risks, security and privacy concerns of performing an evaluation and management service by telemedicine and the availability of in-person appointments. I also discussed with the patient that there may be a patient responsible charge related to this service. The patient expressed understanding and agreed to proceed.   Other persons participating in the visit and their role in the encounter: Husband   Patient's location: Home  Provider's location: [facility]   Chief Complaint:  biclonal gammopathy.     MEDICAL HISTORY:  Past Medical History:  Diagnosis Date   Atrial fibrillation (HCC)    Dysrhythmia    A-fib   Endometrial hyperplasia 01/03/2015   GERD (gastroesophageal reflux disease)    History of kidney stones  Hyperlipidemia    Hypertension    Pneumonia    PONV (postoperative nausea and vomiting)    slow to wake up   Thickened endometrium 01/03/2015   Vaginal delivery 1975, 1985    SURGICAL HISTORY: Past Surgical History:  Procedure Laterality Date   ANKLE FRACTURE SURGERY     ATRIAL  FIBRILLATION ABLATION N/A 02/08/2022   Procedure: ATRIAL FIBRILLATION ABLATION;  Surgeon: Lanier Prude, MD;  Location: [facility];  Service: Cardiovascular;  Laterality: N/A;   CARDIOVERSION N/A 02/17/2023   Procedure: CARDIOVERSION;  Surgeon: Thomasene Ripple, DO;  Location: [facility];  Service: Cardiovascular;  Laterality: N/A;   CATARACT EXTRACTION Right 2020   DILATION AND CURETTAGE OF UTERUS  2012   ablation    EYE SURGERY     Corneal growth removal on right eye   HYSTEROSCOPY WITH D & C N/A 01/03/2015   Procedure: DILATATION AND CURETTAGE /HYSTEROSCOPY;  Surgeon: Shea Evans, MD;  Location: [facility];  Service: Gynecology;  Laterality: N/A;   LAPAROSCOPY     for endometriosis   ROBOTIC ASSISTED TOTAL HYSTERECTOMY WITH BILATERAL SALPINGO OOPHERECTOMY N/A 03/17/2021   Procedure: XI ROBOTIC ASSISTED TOTAL HYSTERECTOMY WITH BILATERAL SALPINGO OOPHORECTOMY;  Surgeon: Carver Fila, MD;  Location: [facility];  Service: Gynecology;  Laterality: N/A;   SENTINEL NODE BIOPSY N/A 03/17/2021   Procedure: SENTINEL NODE BIOPSY;  Surgeon: Carver Fila, MD;  Location: [facility];  Service: Gynecology;  Laterality: N/A;   TONSILLECTOMY AND ADENOIDECTOMY      SOCIAL HISTORY: Social History   Socioeconomic History   Marital status: Widowed    Spouse name: [name]   Number of children: 2   Years of education: 14   Highest education level: Not on file  Occupational History   Occupation: retired    Comment: [employer]  Tobacco Use   Smoking status: Never   Smokeless tobacco: Never   Tobacco comments:    Never smoke 03/02/23  Vaping Use   Vaping status: Never Used  Substance and Sexual Activity   Alcohol use: No    Alcohol/week: 0.0 standard drinks of alcohol   Drug use: No   Sexual activity: Yes    Birth control/protection: Post-menopausal  Other Topics Concern   Not on file  Social History Narrative   Lives with her younger daughter, [name]. Her older daughter lives in  [place] with her family.   Social Determinants of Health   Financial Resource Strain: Not on file  Food Insecurity: Not on file  Transportation Needs: Not on file  Physical Activity: Not on file  Stress: Not on file  Social Connections: Not on file  Intimate Partner Violence: Not on file    FAMILY HISTORY: Family History  Problem Relation Age of Onset   Heart disease Mother 78       CABG   Stroke Father    Prostate cancer Father    Heart disease Brother 30       Transplant, died 10 years   Heart disease Maternal Grandmother    Heart disease Paternal Grandmother    Cancer Paternal Grandfather    Heart disease Brother 93       RF   Leukemia Brother    Leukemia Brother    Hypertension Sister    Cervical cancer Sister    Colon cancer Neg Hx    Breast cancer Neg Hx    Ovarian cancer Neg Hx    Endometrial cancer Neg Hx  Pancreatic cancer Neg Hx     ALLERGIES:  is allergic to talwin [pentazocine], hydrocodone, and prednisone.  MEDICATIONS:  Current Outpatient Medications  Medication Sig Dispense Refill   acetaminophen (TYLENOL) 500 MG tablet Take 1,000 mg by mouth every 6 (six) hours as needed (Arthritis Pain).     ALPRAZolam (XANAX) 0.5 MG tablet Take 0.25 mg by mouth daily as needed (anxiousness).     apixaban (ELIQUIS) 5 MG TABS tablet Take 1 tablet (5 mg total) by mouth 2 (two) times daily. 180 tablet 3   Cholecalciferol (VITAMIN D) 50 MCG (2000 UT) CAPS Take 2,000 Units by mouth at bedtime.     cyclobenzaprine (FLEXERIL) 10 MG tablet Take 1 tablet (10 mg total) by mouth 2 (two) times daily as needed for muscle spasms. 20 tablet 0   estradiol (VIVELLE-DOT) 0.025 MG/24HR PLACE 1 PATCH ONTO THE SKIN 2 (TWO) TIMES A WEEK. SUNDAYS & WEDNESDAYS 8 patch 0   lidocaine (HM LIDOCAINE PATCH) 4 % Place 1 patch onto the skin daily. 10 patch 0   methocarbamol (ROBAXIN) 500 MG tablet Take 500 mg by mouth every 8 (eight) hours as needed.     metoprolol tartrate (LOPRESSOR) 50  MG tablet TAKE ONE TABLET BY MOUTH TWICE A DAY 180 tablet 3   olmesartan (BENICAR) 40 MG tablet Take 40 mg by mouth daily.     oxyCODONE (OXY IR/ROXICODONE) 5 MG immediate release tablet Take 5 mg by mouth every 8 (eight) hours as needed for moderate pain.     Polyethyl Glycol-Propyl Glycol (SYSTANE OP) Place 1 drop into both eyes daily.     rosuvastatin (CRESTOR) 20 MG tablet Take 1 tablet (20 mg total) by mouth every evening. 90 tablet 3   No current facility-administered medications for this visit.    REVIEW OF SYSTEMS:    10 Point review of Systems was done is negative except as noted above.  PHYSICAL EXAMINATION: TELE-MED VISIT  LABORATORY DATA:  I have reviewed the data as listed  .    Latest Ref Rng & Units 08/19/2023    9:23 AM 08/04/2023   12:40 PM 07/23/2023   11:49 AM  CBC  WBC 4.0 - 10.5 K/uL 8.5  8.6  8.0   Hemoglobin 12.0 - 15.0 g/dL 16.1  09.6  04.5   Hematocrit 36.0 - 46.0 % 36.6  38.9  36.6   Platelets 150 - 400 K/uL 262  338  296     .    Latest Ref Rng & Units 08/19/2023    9:23 AM 08/04/2023   12:40 PM 07/23/2023   11:49 AM  CMP  Glucose 70 - 99 mg/dL 409  811  914   BUN 8 - 23 mg/dL 10  14  13    Creatinine 0.44 - 1.00 mg/dL 7.82  9.56  2.13   Sodium 135 - 145 mmol/L 137  137  138   Potassium 3.5 - 5.1 mmol/L 3.8  4.1  3.8   Chloride 98 - 111 mmol/L 104  103  103   CO2 22 - 32 mmol/L 21  27  26    Calcium 8.9 - 10.3 mg/dL 8.4  08.6  9.9   Total Protein 6.5 - 8.1 g/dL  8.0  7.8   Total Bilirubin 0.3 - 1.2 mg/dL  0.4  0.5   Alkaline Phos 38 - 126 U/L  73  60   AST 15 - 41 U/L  16  14   ALT 0 - 44 U/L  14  11    IFE/PE, serum 07/21/2023:   RADIOGRAPHIC STUDIES: I have personally reviewed the radiological images as listed and agreed with the findings in the report. DG Bone Survey Met  Result Date: 08/22/2023 CLINICAL DATA:  Monoclonal paraproteinemia. EXAM: METASTATIC BONE SURVEY COMPARISON:  None Available. FINDINGS: Multiple innumerable small  lucencies are seen throughout the skull which may represent benign venous lakes, but other pathology cannot be excluded. Small rounded lucency seen in distal right humeral shaft. Old L1 and L2 compression fractures are noted. No other significant bony abnormality is noted. IMPRESSION: Small rounded lucency seen in distal right humeral shaft of uncertain significance. Multiple innumerable small lucencies are seen throughout the skull which may represent benign venous lakes, but other pathology cannot be excluded. MRI potentially may be performed for further evaluation. Electronically Signed   By: Lupita Raider M.D.   On: 08/22/2023 10:17   CT Lumbar Spine Wo Contrast  Result Date: 08/19/2023 CLINICAL DATA:  lower back pain. EXAM: CT LUMBAR SPINE WITHOUT CONTRAST TECHNIQUE: Multidetector CT imaging of the lumbar spine was performed without intravenous contrast administration. Multiplanar CT image reconstructions were also generated. RADIATION DOSE REDUCTION: This exam was performed according to the departmental dose-optimization program which includes automated exposure control, adjustment of the mA and/or kV according to patient size and/or use of iterative reconstruction technique. COMPARISON:  Lumbar spine MRI 07/22/2023. FINDINGS: Segmentation: Conventional numbering is assumed with 5 non-rib-bearing, lumbar type vertebral bodies. Alignment: Unchanged grade 1 anterolisthesis of L4 on L5. Vertebrae: Redemonstrated subacute appearing compression fractures of the L1 and L2 superior endplates with slight interval progression of height loss at L1, now moderate. Unchanged moderate height loss at L2. New fracture in the lumbar spine. Paraspinal and other soft tissues: Cholelithiasis. Atherosclerotic calcifications of the abdominal aorta and its branches. Disc levels: T12-L1: Disc bulge and posterior bowing of the L1 superior endplate results in mild spinal canal stenosis. L1-L2: Disc bulge and posterior bowing of  the L2 superior endplate results in mild spinal canal stenosis. L2-L3:  Normal. L3-L4: Disc bulge and facet arthropathy results in mild spinal canal stenosis. L4-L5: Anterolisthesis with uncovered disc and bilateral facet arthropathy results moderate spinal canal stenosis. L5-S1:  Normal. IMPRESSION: 1. Redemonstrated subacute appearing compression fractures of the L1 and L2 superior endplates with slight interval progression of height loss at L1, now moderate. Unchanged moderate height loss at L2. 2. Mild spinal canal stenosis at T12-L1, L1-L2, and L3-L4. 3. Moderate spinal canal stenosis at L4-L5. 4. Cholelithiasis. Aortic Atherosclerosis (ICD10-I70.0). Electronically Signed   By: Orvan Falconer M.D.   On: 08/19/2023 13:02    ASSESSMENT & PLAN:   77 y.o. female with:  IgA kappa biclonal gammopathy  PLAN: -Discussed lab results from 08/04/2023 in detail with the patient. CBC and CMP were stable. Total protein from 24-hr urine test was elevated at 155 with M-spike urine percentage of 7.4% and elevated M-protein in urine of 11. LDH in the normal range of 104. Beta-2 Microglobulin level at 1.6.  -Discussed Multiple myeloma panel results from 08/04/2023 in detail. Showed elevated IgA level at 1,402, low IgM level at 22, and elevated M-protein of 0.8.  -Discussed Kappa/Lambda light chain results from 08/04/2023 with the patient. Showed elevated Kappa/lambda light chain ratio of 78.14%. -Discussed the bone survey results from 08/15/2023. Showed Small rounded lucency seen in distal right humeral shaft of uncertain significance. Multiple innumerable small lucencies are seen throughout the skull which may represent benign venous lakes, but other pathology cannot be  excluded. -According to lab work, we see that the patient has plasma cell disorder, but need more workup such as PET scan. -Discussed the next step  is to get an PET scan and bone marrow biopsy -Patient wants to hold everything right now since she  is going to have vertebroplasty for her back pain.  Labs in 11 weeks Phone visit with Dr Candise Che in 12 weeks   FOLLOW-UP: Labs in 11 weeks Phone visit with Dr Candise Che in 12 weeks  The total time spent in the appointment was 20 minutes* .  All of the patient's questions were answered with apparent satisfaction. The patient knows to call the clinic with any problems, questions or concerns.   Wyvonnia Lora MD MS AAHIVMS [facility] Hematology/Oncology Physician [facility]  .*Total Encounter Time as defined by the Centers for Medicare and Medicaid Services includes, in addition to the face-to-face time of a patient visit (documented in the note above) non-face-to-face time: obtaining and reviewing outside history, ordering and reviewing medications, tests or procedures, care coordination (communications with other health care professionals or caregivers) and documentation in the medical record.   I,Param Shah,acting as a Neurosurgeon for Wyvonnia Lora, MD.,have documented all relevant documentation on the behalf of Wyvonnia Lora, MD,as directed by  Wyvonnia Lora, MD while in the presence of Wyvonnia Lora, MD.    .I have reviewed the above documentation for accuracy and completeness, and I agree with the above. Johney Maine MD

## 2023-08-30 ENCOUNTER — Telehealth: Payer: Self-pay | Admitting: Hematology

## 2023-08-30 ENCOUNTER — Encounter: Payer: Self-pay | Admitting: Cardiology

## 2023-08-30 NOTE — Telephone Encounter (Signed)
Clearance notes have been re-faxed from Robin Searing, NP.

## 2023-08-30 NOTE — Telephone Encounter (Signed)
Per Candise Che 10/28 los patient is aware of scheduled appointment times/dates for follow up

## 2023-08-30 NOTE — Telephone Encounter (Signed)
Daughter called asking to refax clearance to the office. Please advise

## 2023-09-05 DIAGNOSIS — S32010A Wedge compression fracture of first lumbar vertebra, initial encounter for closed fracture: Secondary | ICD-10-CM | POA: Diagnosis not present

## 2023-09-05 DIAGNOSIS — M8008XA Age-related osteoporosis with current pathological fracture, vertebra(e), initial encounter for fracture: Secondary | ICD-10-CM | POA: Diagnosis not present

## 2023-09-12 ENCOUNTER — Ambulatory Visit: Payer: Medicare Other | Admitting: Cardiology

## 2023-09-18 NOTE — Progress Notes (Unsigned)
Cardiology Office Note:   Date:  09/20/2023  ID:  Mary Potter, DOB 25-Oct-1946, MRN [id number] PCP: Alysia Penna, MD  [facility] Providers Cardiologist:  Rollene Rotunda, MD Electrophysiologist:  Lanier Prude, MD {   History of Present Illness:   Mary Potter is a 77 y.o. female who was referred by Alysia Penna, MD for evaluation of an abnormal EKG and HTN.  She had chest pain and had a normal echo and negative POET (Plain Old Exercise Treadmill).  She did have some mild calcium on CT.  She has PAF.   She had ablation.  She was in the ED 5/12 with hypertension.   She also had some chest discomfort.  I reviewed these records.  It seemed to be more of tenderness and not felt to be cardiac.  She also had some elevated blood pressures but this has not been persistent.  I saw her after that.  She has mostly been bothered by back pain.  She actually had lumbar spine surgery.    The patient denies any new symptoms such as chest discomfort, neck or arm discomfort. There has been no new shortness of breath, PND or orthopnea. There has been no presyncope or syncope although she notes on her Lourena Simmonds that she has been in fib.  She really does not feel this.    ROS: As stated in the HPI and negative for all other systems.  Studies Reviewed:    EKG:   EKG Interpretation Date/Time:  Tuesday September 20 2023 15:40:34 EST Ventricular Rate:  79 PR Interval:    QRS Duration:  84 QT Interval:  392 QTC Calculation: 449 R Axis:   -51  Text Interpretation: Atrial flutter with variable A-V block with premature ventricular or aberrantly conducted complexes Left anterior fascicular block Minimal voltage criteria for LVH, may be normal variant ( R in aVL ) Septal infarct , age undetermined When compared with ECG of 13-Mar-2023 16:03,  rhythm is no longer sinus Confirmed by Rollene Rotunda ([id number]) on 09/20/2023 3:50:14 PM     Risk Assessment/Calculations:    CHA2DS2-VASc Score = 5    This indicates a 7.2% annual risk of stroke. The patient's score is based upon: CHF History: 0 HTN History: 1 Diabetes History: 0 Stroke History: 0 Vascular Disease History: 1 Age Score: 2 Gender Score: 1       Physical Exam:   VS:  BP (!) 140/80 (BP Location: Left Arm, Patient Position: Sitting, Cuff Size: Normal)   Pulse 79   Ht 5\' 3"  (1.6 m)   Wt 156 lb 6.4 oz (70.9 kg)   SpO2 100%   BMI 27.71 kg/m    Wt Readings from Last 3 Encounters:  09/20/23 156 lb 6.4 oz (70.9 kg)  08/19/23 146 lb (66.2 kg)  08/04/23 153 lb 12.8 oz (69.8 kg)     GEN: Well nourished, well developed in no acute distress NECK: No JVD; No carotid bruits CARDIAC: Irregular RR, no murmurs, rubs, gallops RESPIRATORY:  Clear to auscultation without rales, wheezing or rhonchi  ABDOMEN: Soft, non-tender, non-distended EXTREMITIES:  Mild ankle edema; No deformity   ASSESSMENT AND PLAN:   Persistent atrial fibrillation: She does not really feel her fibrillation or at least it does not bother her.  I am going to have her wear a 3-day Zio patch to make sure she has adequate rate control.  She tolerates anticoagulation.  Otherwise no change in therapy.   Hypertension: Her blood pressures are previously been  spiking but they are not currently.  No change in therapy.  Coronary artery disease: She has no symptoms.  No change in therapy.  This was noted with coronary calcium previously but she has no ongoing symptoms.  We will continue with risk reduction.     Follow up with me in one year.   Signed, Rollene Rotunda, MD

## 2023-09-19 ENCOUNTER — Other Ambulatory Visit: Payer: Self-pay | Admitting: Internal Medicine

## 2023-09-19 DIAGNOSIS — H18452 Nodular corneal degeneration, left eye: Secondary | ICD-10-CM | POA: Diagnosis not present

## 2023-09-19 DIAGNOSIS — Z961 Presence of intraocular lens: Secondary | ICD-10-CM | POA: Diagnosis not present

## 2023-09-19 DIAGNOSIS — Z13820 Encounter for screening for osteoporosis: Secondary | ICD-10-CM

## 2023-09-19 DIAGNOSIS — H1131 Conjunctival hemorrhage, right eye: Secondary | ICD-10-CM | POA: Diagnosis not present

## 2023-09-20 ENCOUNTER — Encounter: Payer: Self-pay | Admitting: Cardiology

## 2023-09-20 ENCOUNTER — Ambulatory Visit: Payer: Medicare Other | Attending: Cardiology | Admitting: Cardiology

## 2023-09-20 ENCOUNTER — Ambulatory Visit (INDEPENDENT_AMBULATORY_CARE_PROVIDER_SITE_OTHER): Payer: Medicare Other

## 2023-09-20 VITALS — BP 140/80 | HR 79 | Ht 63.0 in | Wt 156.4 lb

## 2023-09-20 DIAGNOSIS — I4819 Other persistent atrial fibrillation: Secondary | ICD-10-CM

## 2023-09-20 DIAGNOSIS — I2510 Atherosclerotic heart disease of native coronary artery without angina pectoris: Secondary | ICD-10-CM | POA: Diagnosis not present

## 2023-09-20 DIAGNOSIS — I10 Essential (primary) hypertension: Secondary | ICD-10-CM

## 2023-09-20 NOTE — Patient Instructions (Signed)
Medication Instructions:  No changes. *If you need a refill on your cardiac medications before your next appointment, please call your pharmacy*    Testing/Procedures: Will be mailed to your home in 3-7 days.  Your physician has recommended that you wear a holter monitor. 3 day Zio. Holter monitors are medical devices that record the heart's electrical activity. Doctors most often use these monitors to diagnose arrhythmias. Arrhythmias are problems with the speed or rhythm of the heartbeat. The monitor is a small, portable device. You can wear one while you do your normal daily activities. This is usually used to diagnose what is causing palpitations/syncope (passing out).    Follow-Up: At [facility], you and your health needs are our priority.  As part of our continuing mission to provide you with exceptional heart care, we have created designated Provider Care Teams.  These Care Teams include your primary Cardiologist (physician) and Advanced Practice Providers (APPs -  Physician Assistants and Nurse Practitioners) who all work together to provide you with the care you need, when you need it.  Your next appointment:   12 month(s)  Provider:   Rollene Rotunda, MD      Christena Deem- Long Term Monitor Instructions  Your physician has requested you wear a ZIO patch monitor for 3 days.  This is a single patch monitor. [employer] supplies one patch monitor per enrollment. Additional stickers are not available. Please do not apply patch if you will be having a Nuclear Stress Test,  Echocardiogram, Cardiac CT, MRI, or Chest Xray during the period you would be wearing the  monitor. The patch cannot be worn during these tests. You cannot remove and re-apply the  ZIO XT patch monitor.  Your ZIO patch monitor will be mailed 3 day USPS to your address on file. It may take 3-5 days  to receive your monitor after you have been enrolled.  Once you have received your monitor, please review the  enclosed instructions. Your monitor  has already been registered assigning a specific monitor serial # to you.  Billing and Patient Assistance Program Information  We have supplied [employer] with any of your insurance information on file for billing purposes. [employer] offers a sliding scale Patient Assistance Program for patients that do not have  insurance, or whose insurance does not completely cover the cost of the ZIO monitor.  You must apply for the Patient Assistance Program to qualify for this discounted rate.  To apply, please call [employer] at [phone], select option 4, select option 2, ask to apply for  Patient Assistance Program. Meredeth Ide will ask your household income, and how many people  are in your household. They will quote your out-of-pocket cost based on that information.  [employer] will also be able to set up a 73-month, interest-free payment plan if needed.  Applying the monitor   Shave hair from upper left chest.  Hold abrader disc by orange tab. Rub abrader in 40 strokes over the upper left chest as  indicated in your monitor instructions.  Clean area with 4 enclosed alcohol pads. Let dry.  Apply patch as indicated in monitor instructions. Patch will be placed under collarbone on left  side of chest with arrow pointing upward.  Rub patch adhesive wings for 2 minutes. Remove white label marked "1". Remove the white  label marked "2". Rub patch adhesive wings for 2 additional minutes.  While looking in a mirror, press and release button in center of patch. A small green light will  flash 3-4 times. This will be your only indicator that the monitor has been turned on.  Do not shower for the first 24 hours. You may shower after the first 24 hours.  Press the button if you feel a symptom. You will hear a small click. Record Date, Time and  Symptom in the Patient Logbook.  When you are ready to remove the patch, follow instructions on the last 2 pages of Patient  Logbook.  Stick patch monitor onto the last page of Patient Logbook.  Place Patient Logbook in the blue and white box. Use locking tab on box and tape box closed  securely. The blue and white box has prepaid postage on it. Please place it in the mailbox as  soon as possible. Your physician should have your test results approximately 7 days after the  monitor has been mailed back to [facility].  Call [facility] Customer Care at [phone] if you have questions regarding  your ZIO XT patch monitor. Call them immediately if you see an orange light blinking on your  monitor.  If your monitor falls off in less than 4 days, contact our Monitor department at [phone].  If your monitor becomes loose or falls off after 4 days call [employer] at [phone] for  suggestions on securing your monitor

## 2023-09-20 NOTE — Progress Notes (Unsigned)
Enrolled patient for a 3 day Zio XT monitor to be mailed to patients home  

## 2023-09-22 ENCOUNTER — Ambulatory Visit: Payer: Medicare Other | Admitting: Student

## 2023-09-23 DIAGNOSIS — I4819 Other persistent atrial fibrillation: Secondary | ICD-10-CM

## 2023-10-03 ENCOUNTER — Ambulatory Visit (HOSPITAL_BASED_OUTPATIENT_CLINIC_OR_DEPARTMENT_OTHER)
Admission: RE | Admit: 2023-10-03 | Discharge: 2023-10-03 | Disposition: A | Discharge status: Home / Self Care | Payer: Medicare Other | Source: Ambulatory Visit | Attending: Internal Medicine | Admitting: Internal Medicine

## 2023-10-03 DIAGNOSIS — Z8262 Family history of osteoporosis: Secondary | ICD-10-CM | POA: Diagnosis not present

## 2023-10-03 DIAGNOSIS — Z780 Asymptomatic menopausal state: Secondary | ICD-10-CM | POA: Insufficient documentation

## 2023-10-03 DIAGNOSIS — Z13820 Encounter for screening for osteoporosis: Secondary | ICD-10-CM | POA: Diagnosis not present

## 2023-10-03 DIAGNOSIS — M8589 Other specified disorders of bone density and structure, multiple sites: Secondary | ICD-10-CM | POA: Insufficient documentation

## 2023-10-03 DIAGNOSIS — M85832 Other specified disorders of bone density and structure, left forearm: Secondary | ICD-10-CM | POA: Diagnosis not present

## 2023-10-04 DIAGNOSIS — I4819 Other persistent atrial fibrillation: Secondary | ICD-10-CM | POA: Diagnosis not present

## 2023-10-20 DIAGNOSIS — S32010A Wedge compression fracture of first lumbar vertebra, initial encounter for closed fracture: Secondary | ICD-10-CM | POA: Diagnosis not present

## 2023-10-20 DIAGNOSIS — Z6827 Body mass index (BMI) 27.0-27.9, adult: Secondary | ICD-10-CM | POA: Diagnosis not present

## 2023-10-20 LAB — MOLECULAR PATHOLOGY

## 2023-10-21 ENCOUNTER — Encounter: Payer: Self-pay | Admitting: *Deleted

## 2023-11-09 DIAGNOSIS — E785 Hyperlipidemia, unspecified: Secondary | ICD-10-CM | POA: Diagnosis not present

## 2023-11-09 DIAGNOSIS — M810 Age-related osteoporosis without current pathological fracture: Secondary | ICD-10-CM | POA: Diagnosis not present

## 2023-11-09 DIAGNOSIS — K5900 Constipation, unspecified: Secondary | ICD-10-CM | POA: Diagnosis not present

## 2023-11-09 DIAGNOSIS — I10 Essential (primary) hypertension: Secondary | ICD-10-CM | POA: Diagnosis not present

## 2023-11-09 DIAGNOSIS — R7303 Prediabetes: Secondary | ICD-10-CM | POA: Diagnosis not present

## 2023-11-16 DIAGNOSIS — R000 Tachycardia, unspecified: Secondary | ICD-10-CM | POA: Diagnosis not present

## 2023-11-16 DIAGNOSIS — M79643 Pain in unspecified hand: Secondary | ICD-10-CM | POA: Diagnosis not present

## 2023-11-16 DIAGNOSIS — Z0000 Encounter for general adult medical examination without abnormal findings: Secondary | ICD-10-CM | POA: Diagnosis not present

## 2023-11-16 DIAGNOSIS — Z23 Encounter for immunization: Secondary | ICD-10-CM | POA: Diagnosis not present

## 2023-11-16 DIAGNOSIS — R809 Proteinuria, unspecified: Secondary | ICD-10-CM | POA: Diagnosis not present

## 2023-11-16 DIAGNOSIS — C541 Malignant neoplasm of endometrium: Secondary | ICD-10-CM | POA: Diagnosis not present

## 2023-11-16 DIAGNOSIS — I10 Essential (primary) hypertension: Secondary | ICD-10-CM | POA: Diagnosis not present

## 2023-11-16 DIAGNOSIS — D6869 Other thrombophilia: Secondary | ICD-10-CM | POA: Diagnosis not present

## 2023-11-16 DIAGNOSIS — D472 Monoclonal gammopathy: Secondary | ICD-10-CM | POA: Diagnosis not present

## 2023-11-16 DIAGNOSIS — D692 Other nonthrombocytopenic purpura: Secondary | ICD-10-CM | POA: Diagnosis not present

## 2023-11-16 DIAGNOSIS — E785 Hyperlipidemia, unspecified: Secondary | ICD-10-CM | POA: Diagnosis not present

## 2023-11-16 DIAGNOSIS — Z1339 Encounter for screening examination for other mental health and behavioral disorders: Secondary | ICD-10-CM | POA: Diagnosis not present

## 2023-11-16 DIAGNOSIS — Z1331 Encounter for screening for depression: Secondary | ICD-10-CM | POA: Diagnosis not present

## 2023-11-16 DIAGNOSIS — I480 Paroxysmal atrial fibrillation: Secondary | ICD-10-CM | POA: Diagnosis not present

## 2023-11-18 ENCOUNTER — Other Ambulatory Visit: Payer: Self-pay

## 2023-11-18 ENCOUNTER — Encounter: Payer: Self-pay | Admitting: Rehabilitative and Restorative Service Providers"

## 2023-11-18 ENCOUNTER — Ambulatory Visit: Payer: Medicare Other | Attending: Student | Admitting: Rehabilitative and Restorative Service Providers"

## 2023-11-18 DIAGNOSIS — M6281 Muscle weakness (generalized): Secondary | ICD-10-CM | POA: Diagnosis not present

## 2023-11-18 DIAGNOSIS — S32010D Wedge compression fracture of first lumbar vertebra, subsequent encounter for fracture with routine healing: Secondary | ICD-10-CM | POA: Insufficient documentation

## 2023-11-18 DIAGNOSIS — X58XXXD Exposure to other specified factors, subsequent encounter: Secondary | ICD-10-CM | POA: Diagnosis not present

## 2023-11-18 DIAGNOSIS — R252 Cramp and spasm: Secondary | ICD-10-CM

## 2023-11-18 DIAGNOSIS — M5459 Other low back pain: Secondary | ICD-10-CM

## 2023-11-18 NOTE — Patient Instructions (Signed)
 Trigger Point Dry Needling  What is Trigger Point Dry Needling (DN)? DN is a physical therapy technique used to treat muscle pain and dysfunction. Specifically, DN helps deactivate muscle trigger points (muscle knots).  A thin filiform needle is used to penetrate the skin and stimulate the underlying trigger point. The goal is for a local twitch response (LTR) to occur and for the trigger point to relax. No medication of any kind is injected during the procedure.   What Does Trigger Point Dry Needling Feel Like?  The procedure feels different for each individual patient. Some patients report that they do not actually feel the needle enter the skin and overall the process is not painful. Very mild bleeding may occur. However, many patients feel a deep cramping in the muscle in which the needle was inserted. This is the local twitch response.   How Will I feel after the treatment? Soreness is normal, and the onset of soreness may not occur for a few hours. Typically this soreness does not last longer than two days.  Bruising is uncommon, however; ice can be used to decrease any possible bruising.  In rare cases feeling tired or nauseous after the treatment is normal. In addition, your symptoms may get worse before they get better, this period will typically not last longer than 24 hours.   What Can I do After My Treatment? Increase your hydration by drinking more water for the next 24 hours.  You may place ice or heat on the areas treated that have become sore, however, do not use heat on inflamed or bruised areas. Heat often brings more relief post needling. You can continue your regular activities, but vigorous activity is not recommended initially after the treatment for 24 hours. DN is best combined with other physical therapy such as strengthening, stretching, and other therapies.   What are the complications? While your therapist has had extensive training in minimizing the risks of trigger  point dry needling, it is important to understand the risks of any procedure.  Risks include bleeding, pain, fatigue, hematoma, infection, vertigo, nausea or nerve involvement. Monitor for any changes to your skin or sensation. Contact your therapist or MD with concerns.  A rare but serious complication is a pneumothorax over or near your middle and upper chest and back If you have dry needling in this area, monitor for the following symptoms: Shortness of breath on exertion and/or Difficulty taking a deep breath and/or Chest Pain and/or A dry cough If any of the above symptoms develop, please go to the nearest emergency room or call 911. Tell them you had dry needling over your thorax and report any symptoms you are having. Please follow-up with your treating therapist after you complete the medical evaluation.   [facility]  [address] [place] [address].  [phone]

## 2023-11-18 NOTE — Therapy (Signed)
OUTPATIENT PHYSICAL THERAPY THORACOLUMBAR EVALUATION   Patient Name: Mary Potter MRN: [id number] DOB:1946-05-25, 78 y.o., female Today's Date: 11/18/2023  END OF SESSION:  PT End of Session - 11/18/23 1108     Visit Number 1    Date for PT Re-Evaluation 01/13/24    Authorization Type Medicare    PT Start Time 1100    PT Stop Time 1140    PT Time Calculation (min) 40 min    Activity Tolerance Patient tolerated treatment well    Behavior During Therapy [facility] for tasks assessed/performed             Past Medical History:  Diagnosis Date   Atrial fibrillation (HCC)    Dysrhythmia    A-fib   Endometrial hyperplasia 01/03/2015   GERD (gastroesophageal reflux disease)    History of kidney stones    Hyperlipidemia    Hypertension    Pneumonia    PONV (postoperative nausea and vomiting)    slow to wake up   Thickened endometrium 01/03/2015   Vaginal delivery 1976, 1985   Past Surgical History:  Procedure Laterality Date   ANKLE FRACTURE SURGERY     ATRIAL FIBRILLATION ABLATION N/A 02/08/2022   Procedure: ATRIAL FIBRILLATION ABLATION;  Surgeon: Lanier Prude, MD;  Location: [facility];  Service: Cardiovascular;  Laterality: N/A;   CARDIOVERSION N/A 02/17/2023   Procedure: CARDIOVERSION;  Surgeon: Thomasene Ripple, DO;  Location: [facility];  Service: Cardiovascular;  Laterality: N/A;   CATARACT EXTRACTION Right 2020   DILATION AND CURETTAGE OF UTERUS  2012   ablation    EYE SURGERY     Corneal growth removal on right eye   HYSTEROSCOPY WITH D & C N/A 01/03/2015   Procedure: DILATATION AND CURETTAGE /HYSTEROSCOPY;  Surgeon: Shea Evans, MD;  Location: [facility];  Service: Gynecology;  Laterality: N/A;   LAPAROSCOPY     for endometriosis   ROBOTIC ASSISTED TOTAL HYSTERECTOMY WITH BILATERAL SALPINGO OOPHERECTOMY N/A 03/17/2021   Procedure: XI ROBOTIC ASSISTED TOTAL HYSTERECTOMY WITH BILATERAL SALPINGO OOPHORECTOMY;  Surgeon: Carver Fila, MD;  Location:  [facility];  Service: Gynecology;  Laterality: N/A;   SENTINEL NODE BIOPSY N/A 03/17/2021   Procedure: SENTINEL NODE BIOPSY;  Surgeon: Carver Fila, MD;  Location: [facility];  Service: Gynecology;  Laterality: N/A;   TONSILLECTOMY AND ADENOIDECTOMY     Patient Active Problem List   Diagnosis Date Noted   Coronary artery disease involving native coronary artery of native heart without angina pectoris 03/28/2023   Hypercoagulable state due to paroxysmal atrial fibrillation (HCC) 03/02/2023   Persistent atrial fibrillation (HCC) 03/02/2023   Endometrial adenocarcinoma (HCC) 03/20/2021   Complex endometrial hyperplasia with atypia 02/18/2021   PAF (paroxysmal atrial fibrillation) (HCC) 10/15/2020   Elevated coronary artery calcium score 09/14/2020   Essential hypertension 09/14/2020   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 06/29/2018   Greater trochanteric bursitis of left hip 04/18/2018   Acute left lumbar radiculopathy 03/21/2018   Iliotibial band tendinitis of right side 09/30/2016   Trigger finger, right middle finger 09/30/2016   Thickened endometrium 01/03/2015   Endometrial hyperplasia 01/03/2015   Tibialis posterior tendonitis 05/09/2014   H/O ankle fusion 05/09/2014   Left leg pain 04/17/2014   Lipid disorder 11/14/2012   GERD 09/11/2010   CONSTIPATION 09/11/2010   DIARRHEA 09/11/2010    PCP: Alysia Penna, MD  REFERRING PROVIDER: Floreen Comber, NP  REFERRING DIAG: S32.010A (ICD-10-CM) - Wedge compression fracture of first  lumbar vertebra [facility])  Rationale for Evaluation and Treatment: Rehabilitation  THERAPY DIAG:  Other low back pain - Plan: PT plan of care cert/re-cert  Cramp and spasm - Plan: PT plan of care cert/re-cert  Muscle weakness (generalized) - Plan: PT plan of care cert/re-cert  ONSET DATE: s/p kyphoplasty on 09/05/2023  SUBJECTIVE:                                                                                                                                                                                            SUBJECTIVE STATEMENT: Patient reports that she had the compression fractures that were not healing, so she underwent kyphoplasty on 09/05/2023.  Pt states that she was supposed to start PT earlier, but there was a mix-up and she did not get in as soon as she was hoping.  PERTINENT HISTORY:  Right Ankle Fusion, s/p kyphoplasty on 09/05/2023, osteopenia Pt states that she is being worked up at the [facility] to rule out Multiple Myeloma  PAIN:  Are you having pain? Yes: NPRS scale: 1-2/10 Pain location: mid to low back Pain description: soreness Aggravating factors: walking, reaching Relieving factors: sitting  PRECAUTIONS: None  RED FLAGS: Compression fracture: Yes: s/p kyphoplasty    WEIGHT BEARING RESTRICTIONS: No  FALLS:  Has patient fallen in last 6 months? No  LIVING ENVIRONMENT: Lives with: lives alone Lives in: House/apartment Stairs:  one home Has following equipment at home: Grab bars  OCCUPATION: Retired  PLOF: Independent and Leisure: gardening, reading, walking her dog, socializing with friends  PATIENT GOALS: To be stronger to be able to return to gardening and other desired tasks.  NEXT MD VISIT: As Needed  OBJECTIVE:  Note: Objective measures were completed at Evaluation unless otherwise noted.  DIAGNOSTIC FINDINGS:  Lumbar CT on 08/19/2023: IMPRESSION: 1. Redemonstrated subacute appearing compression fractures of the L1 and L2 superior endplates with slight interval progression of height loss at L1, now moderate. Unchanged moderate height loss at L2. 2. Mild spinal canal stenosis at T12-L1, L1-L2, and L3-L4. 3. Moderate spinal canal stenosis at L4-L5. 4. Cholelithiasis.  PATIENT SURVEYS:  Eval:  FOTO 51 (projected 61 by visit 12)  COGNITION: Overall cognitive status: Within functional limits for tasks assessed     SENSATION: Patient denies and numbness or  tingling  MUSCLE LENGTH: Hamstrings: Tightness bilaterally  POSTURE: rounded shoulders, forward head, and flexed trunk    LUMBAR ROM:   Eval:  Patient with painful lumbar ROM, limited at least 50%  LOWER EXTREMITY ROM:     WFL  LOWER EXTREMITY MMT:    Eval:   Right LE strength is grossly  WFL Left hip strength is 4/5  LUMBAR SPECIAL TESTS:  Slump test: Negative  FUNCTIONAL TESTS:  Eval: 5 times sit to stand: 15.07 sec 3 minute walk test: 560 ft with slight increase in pain in right flank area  GAIT: Distance walked: >500 ft Assistive device utilized: None Level of assistance: Complete Independence Comments: Patient with antalgic gait pattern with decreased step length and Trendelenburg gait  TODAY'S TREATMENT DATE: 11/18/2023 Seated hamstring stretch x20 sec bilat Seated piriformis stretch x20 sec bilat Seated transversus abdominus contraction x10 Standing L stretch x20 sec   PATIENT EDUCATION:  Education details: Issued HEP and provided handout and education on dry needling Person educated: Patient Education method: Programmer, multimedia, Facilities manager, and Handouts Education comprehension: verbalized understanding and returned demonstration  HOME EXERCISE PROGRAM: Access Code: [email or web address] URL: [email or web address] Date: 11/18/2023 Prepared by: Clydie Braun Tanish Sinkler  Exercises - Seated Hamstring Stretch  - 1 x daily - 7 x weekly - 2 reps - 20 sec hold - Seated Piriformis Stretch with Trunk Bend  - 1 x daily - 7 x weekly - 2 reps - 20 sec hold - Seated Transversus Abdominis Bracing  - 1 x daily - 7 x weekly - 2 sets - 10 reps - Standing 'L' Stretch at Counter  - 1 x daily - 7 x weekly - 2 reps - 20 sec  hold  ASSESSMENT:  CLINICAL IMPRESSION: Patient is a 78 y.o. female who was seen today for physical therapy evaluation and treatment for Wedge Compression Fracture of L1 s/p kyphoplasty on 09/05/2023. Patient's PLOF is able to garden and walk her dog twice daily  without increased pain.  Patient did not have a fall or other incident that led up to her lumbar compression fracture, so [facility] to be sure that she does not have Multiple Myeloma.  Patient presents with muscle weakness, decreased flexibility, muscle spasms, difficulty walking, and increased pain.  Patient would benefit from skilled PT to address her functional impairments to allow her to return to her active lifestyle without increased pain.  OBJECTIVE IMPAIRMENTS: decreased balance, difficulty walking, decreased strength, increased muscle spasms, impaired flexibility, postural dysfunction, and pain.   ACTIVITY LIMITATIONS: carrying, lifting, bending, standing, and squatting  PARTICIPATION LIMITATIONS: cleaning, shopping, community activity, and yard work  PERSONAL FACTORS: Past/current experiences and 3+ comorbidities: s/p lumbar kyphoplasty, right ankle fusion, osteopenia  are also affecting patient's functional outcome.   REHAB POTENTIAL: Good  CLINICAL DECISION MAKING: Stable/uncomplicated  EVALUATION COMPLEXITY: Low   GOALS: Goals reviewed with patient? Yes  SHORT TERM GOALS: Target date: 12/09/2023  Patient will be independent with initial HEP. Baseline: Goal status: INITIAL  2.  Patient will report at least a 25% improvement in symptoms since starting PT. Baseline:  Goal status: INITIAL   LONG TERM GOALS: Target date: 01/13/2024  Patient will be independent with advanced HEP to allow for self progression after discharge. Baseline:  Goal status: INITIAL  2.  Patient will increase LE and trunk functional strength to [facility] to allow her to lift buckets of dirt for gardening in the Spring. Baseline:  Goal status: INITIAL  3.  Patient will report ability to return to walking her dog daily on previous route without increased pain. Baseline:  Goal status: INITIAL  4.  Patient will increase FOTO to at least 61 to  demonstrate improved functional mobility. Baseline: 51 Goal status: INITIAL  5.  Patient to report ability to return to going out to dinner  or other outings with her friends without increased pain or muscle spasms.  Baseline:   Goal status:  INITIAL    PLAN:  PT FREQUENCY: 2x/week  PT DURATION: 8 weeks  PLANNED INTERVENTIONS: [id number]- PT Re-evaluation, 97110-Therapeutic exercises, 97530- Therapeutic activity, 97112- Neuromuscular re-education, 97535- Self Care, 56213- Manual therapy, [phone]- Gait training, [phone]- Canalith repositioning, [id number]- Aquatic Therapy, 97014- Electrical stimulation (unattended), [id number]- Electrical stimulation (manual), [id number]- Ultrasound, Patient/Family education, Balance training, Stair training, Taping, Dry Needling, Joint mobilization, Joint manipulation, Spinal manipulation, Spinal mobilization, Scar mobilization, Vestibular training, Cryotherapy, and Moist heat.  PLAN FOR NEXT SESSION: Assess and progress HEP as indicated, strengthening, flexibility, manual/dry needling as indicated    Reather Laurence, PT, DPT 11/18/23, 12:03 PM  [facility] [address], [address] [place], [place] [address] Phone # [phone] Fax [phone]

## 2023-11-21 ENCOUNTER — Ambulatory Visit: Payer: Medicare Other | Admitting: Rehabilitative and Restorative Service Providers"

## 2023-11-21 ENCOUNTER — Other Ambulatory Visit: Payer: Self-pay

## 2023-11-21 ENCOUNTER — Encounter: Payer: Self-pay | Admitting: Rehabilitative and Restorative Service Providers"

## 2023-11-21 DIAGNOSIS — M5459 Other low back pain: Secondary | ICD-10-CM

## 2023-11-21 DIAGNOSIS — D472 Monoclonal gammopathy: Secondary | ICD-10-CM

## 2023-11-21 DIAGNOSIS — R252 Cramp and spasm: Secondary | ICD-10-CM

## 2023-11-21 DIAGNOSIS — M6281 Muscle weakness (generalized): Secondary | ICD-10-CM

## 2023-11-21 NOTE — Therapy (Signed)
OUTPATIENT PHYSICAL THERAPY THORACOLUMBAR EVALUATION   Patient Name: Mary Potter MRN: [id number] DOB:1946-02-08, 78 y.o., female Today's Date: 11/21/2023  END OF SESSION:  PT End of Session - 11/21/23 1233     Visit Number 2    Date for PT Re-Evaluation 01/13/24    Authorization Type Medicare    PT Start Time 1230    PT Stop Time 1310    PT Time Calculation (min) 40 min    Activity Tolerance Patient tolerated treatment well    Behavior During Therapy [facility] for tasks assessed/performed             Past Medical History:  Diagnosis Date   Atrial fibrillation (HCC)    Dysrhythmia    A-fib   Endometrial hyperplasia 01/03/2015   GERD (gastroesophageal reflux disease)    History of kidney stones    Hyperlipidemia    Hypertension    Pneumonia    PONV (postoperative nausea and vomiting)    slow to wake up   Thickened endometrium 01/03/2015   Vaginal delivery 1976, 1985   Past Surgical History:  Procedure Laterality Date   ANKLE FRACTURE SURGERY     ATRIAL FIBRILLATION ABLATION N/A 02/08/2022   Procedure: ATRIAL FIBRILLATION ABLATION;  Surgeon: Lanier Prude, MD;  [facility];  Service: Cardiovascular;  Laterality: N/A;   CARDIOVERSION N/A 02/17/2023   Procedure: CARDIOVERSION;  Surgeon: Thomasene Ripple, DO;  Location: [facility];  Service: Cardiovascular;  Laterality: N/A;   CATARACT EXTRACTION Right 2020   DILATION AND CURETTAGE OF UTERUS  2012   ablation    EYE SURGERY     Corneal growth removal on right eye   HYSTEROSCOPY WITH D & C N/A 01/03/2015   Procedure: DILATATION AND CURETTAGE /HYSTEROSCOPY;  Surgeon: Shea Evans, MD;  Location: [facility];  Service: Gynecology;  Laterality: N/A;   LAPAROSCOPY     for endometriosis   ROBOTIC ASSISTED TOTAL HYSTERECTOMY WITH BILATERAL SALPINGO OOPHERECTOMY N/A 03/17/2021   Procedure: XI ROBOTIC ASSISTED TOTAL HYSTERECTOMY WITH BILATERAL SALPINGO OOPHORECTOMY;  Surgeon: Carver Fila, MD;  Location:  [facility];  Service: Gynecology;  Laterality: N/A;   SENTINEL NODE BIOPSY N/A 03/17/2021   Procedure: SENTINEL NODE BIOPSY;  Surgeon: Carver Fila, MD;  Location: [facility];  Service: Gynecology;  Laterality: N/A;   TONSILLECTOMY AND ADENOIDECTOMY     Patient Active Problem List   Diagnosis Date Noted   Coronary artery disease involving native coronary artery of native heart without angina pectoris 03/28/2023   Hypercoagulable state due to paroxysmal atrial fibrillation (HCC) 03/02/2023   Persistent atrial fibrillation (HCC) 03/02/2023   Endometrial adenocarcinoma (HCC) 03/20/2021   Complex endometrial hyperplasia with atypia 02/18/2021   PAF (paroxysmal atrial fibrillation) (HCC) 10/15/2020   Elevated coronary artery calcium score 09/14/2020   Essential hypertension 09/14/2020   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 06/29/2018   Greater trochanteric bursitis of left hip 04/18/2018   Acute left lumbar radiculopathy 03/21/2018   Iliotibial band tendinitis of right side 09/30/2016   Trigger finger, right middle finger 09/30/2016   Thickened endometrium 01/03/2015   Endometrial hyperplasia 01/03/2015   Tibialis posterior tendonitis 05/09/2014   H/O ankle fusion 05/09/2014   Left leg pain 04/17/2014   Lipid disorder 11/14/2012   GERD 09/11/2010   CONSTIPATION 09/11/2010   DIARRHEA 09/11/2010    PCP: Alysia Penna, MD  REFERRING PROVIDER: Floreen Comber, NP  REFERRING DIAG: S32.010A (ICD-10-CM) - Wedge compression fracture of first  lumbar vertebra (HCC)  Rationale for Evaluation and Treatment: Rehabilitation  THERAPY DIAG:  Other low back pain  Cramp and spasm  Muscle weakness (generalized)  ONSET DATE: s/p kyphoplasty on 09/05/2023  SUBJECTIVE:                                                                                                                                                                                           SUBJECTIVE  STATEMENT: Patient reports that her exercises are going well.  States that she has a floor bike  at home that she was wondering if she could resume using.  PERTINENT HISTORY:  Right Ankle Fusion, s/p kyphoplasty on 09/05/2023, osteopenia Pt states that she is being worked up at the [facility] to rule out Multiple Myeloma  PAIN:  Are you having pain? Yes: NPRS scale: 1-2/10 Pain location: right flank area of low back Pain description: soreness Aggravating factors: walking, reaching Relieving factors: sitting  PRECAUTIONS: None  RED FLAGS: Compression fracture: Yes: s/p kyphoplasty    WEIGHT BEARING RESTRICTIONS: No  FALLS:  Has patient fallen in last 6 months? No  LIVING ENVIRONMENT: Lives with: lives alone Lives in: House/apartment Stairs:  one home Has following equipment at home: Grab bars  OCCUPATION: Retired  PLOF: Independent and Leisure: gardening, reading, walking her dog, socializing with friends  PATIENT GOALS: To be stronger to be able to return to gardening and other desired tasks.  NEXT MD VISIT: As Needed  OBJECTIVE:  Note: Objective measures were completed at Evaluation unless otherwise noted.  DIAGNOSTIC FINDINGS:  Lumbar CT on 08/19/2023: IMPRESSION: 1. Redemonstrated subacute appearing compression fractures of the L1 and L2 superior endplates with slight interval progression of height loss at L1, now moderate. Unchanged moderate height loss at L2. 2. Mild spinal canal stenosis at T12-L1, L1-L2, and L3-L4. 3. Moderate spinal canal stenosis at L4-L5. 4. Cholelithiasis.  PATIENT SURVEYS:  Eval:  FOTO 51 (projected 68 by visit 12)  COGNITION: Overall cognitive status: Within functional limits for tasks assessed     SENSATION: Patient denies and numbness or tingling  MUSCLE LENGTH: Hamstrings: Tightness bilaterally  POSTURE: rounded shoulders, forward head, and flexed trunk    LUMBAR ROM:   Eval:  Patient with painful lumbar ROM,  limited at least 50%  LOWER EXTREMITY ROM:     WFL  LOWER EXTREMITY MMT:    Eval:   Right LE strength is grossly WFL Left hip strength is 4/5  LUMBAR SPECIAL TESTS:  Slump test: Negative  FUNCTIONAL TESTS:  Eval: 5 times sit to stand: 15.07 sec 3 minute walk test: 560 ft with slight increase in pain  in right flank area  GAIT: Distance walked: >500 ft Assistive device utilized: None Level of assistance: Complete Independence Comments: Patient with antalgic gait pattern with decreased step length and Trendelenburg gait  TODAY'S TREATMENT  DATE: 11/21/2023 Nustep level 4 x6 min with PT present to discuss status Seated hip adduction ball squeeze 2x10 Seated transversus abdominus with hands pressing ball into thighs 2x10 Seated with 2# ankle weights:  LAQ, marching, hip ER.  2x10 each bilat Seated piriformis stretch 2x20 sec bilat Sit to/from stand on foam pad 2x5 Seated green pball rollout 5x5 sec hold Supine straight leg raise with 2# ankle weights.  2x10 bilat Supine hamstring, hip adductor, and IT band stretch with strap x20 sec each bilat Supine lower trunk rotation x10 Supine transversus abdominus contraction with marching 2x10 Supine isometric dead bug 5x10 sec hold   DATE: 11/18/2023 Seated hamstring stretch x20 sec bilat Seated piriformis stretch x20 sec bilat Seated transversus abdominus contraction x10 Standing L stretch x20 sec   PATIENT EDUCATION:  Education details: Issued HEP and provided handout and education on dry needling Person educated: Patient Education method: Programmer, multimedia, Facilities manager, and Handouts Education comprehension: verbalized understanding and returned demonstration  HOME EXERCISE PROGRAM: Access Code: [email or web address] URL: [email or web address] Date: 11/21/2023 Prepared by: Clydie Braun Arnold Depinto  Exercises - Seated Hamstring Stretch  - 1 x daily - 7 x weekly - 2 reps - 20 sec hold - Seated Piriformis Stretch with Trunk Bend  - 1  x daily - 7 x weekly - 2 reps - 20 sec hold - Seated Transversus Abdominis Bracing  - 1 x daily - 7 x weekly - 2 sets - 10 reps - Standing 'L' Stretch at Counter  - 1 x daily - 7 x weekly - 2 reps - 20 sec  hold - Seated Long Arc Quad  - 1 x daily - 7 x weekly - 2 sets - 10 reps - Sit to Stand Without Arm Support  - 1 x daily - 7 x weekly - 5 sets - 5-10 reps - Supine Lower Trunk Rotation  - 1 x daily - 7 x weekly - 10 reps - Supine March with Posterior Pelvic Tilt  - 1 x daily - 7 x weekly - 2 sets - 10 reps - Isometric Dead Bug  - 1 x daily - 7 x weekly - 5 reps - 10 sec hold  ASSESSMENT:  CLINICAL IMPRESSION: Ms Baus presents to skilled PT reporting some flank/low back pain.  Patient reports that she has been compliant with HEP.  Patient able to progress with core stability exercises during session with minimal cuing throughout for improved technique.  Patient with great participation throughout.  Patient provided with updated HEP handout for new exercises performed in clinic today.  Patient continues to require skilled PT to progress towards goal related activities.  OBJECTIVE IMPAIRMENTS: decreased balance, difficulty walking, decreased strength, increased muscle spasms, impaired flexibility, postural dysfunction, and pain.   ACTIVITY LIMITATIONS: carrying, lifting, bending, standing, and squatting  PARTICIPATION LIMITATIONS: cleaning, shopping, community activity, and yard work  PERSONAL FACTORS: Past/current experiences and 3+ comorbidities: s/p lumbar kyphoplasty, right ankle fusion, osteopenia  are also affecting patient's functional outcome.   REHAB POTENTIAL: Good  CLINICAL DECISION MAKING: Stable/uncomplicated  EVALUATION COMPLEXITY: Low   GOALS: Goals reviewed with patient? Yes  SHORT TERM GOALS: Target date: 12/09/2023  Patient will be independent with initial HEP. Baseline: Goal status: Ongoing  2.  Patient will report at least a 25% improvement in symptoms  since  starting PT. Baseline:  Goal status: INITIAL   LONG TERM GOALS: Target date: 01/13/2024  Patient will be independent with advanced HEP to allow for self progression after discharge. Baseline:  Goal status: INITIAL  2.  Patient will increase LE and trunk functional strength to [facility] to allow her to lift buckets of dirt for gardening in the Spring. Baseline:  Goal status: INITIAL  3.  Patient will report ability to return to walking her dog daily on previous route without increased pain. Baseline:  Goal status: INITIAL  4.  Patient will increase FOTO to at least 61 to demonstrate improved functional mobility. Baseline: 51 Goal status: INITIAL  5.  Patient to report ability to return to going out to dinner or other outings with her friends without increased pain or muscle spasms.  Baseline:   Goal status:  INITIAL    PLAN:  PT FREQUENCY: 2x/week  PT DURATION: 8 weeks  PLANNED INTERVENTIONS: 97164- PT Re-evaluation, 97110-Therapeutic exercises, 97530- Therapeutic activity, 97112- Neuromuscular re-education, 97535- Self Care, 40981- Manual therapy, [phone]- Gait training, [phone]- Canalith repositioning, [id number]- Aquatic Therapy, 97014- Electrical stimulation (unattended), [id number]- Electrical stimulation (manual), [id number]- Ultrasound, Patient/Family education, Balance training, Stair training, Taping, Dry Needling, Joint mobilization, Joint manipulation, Spinal manipulation, Spinal mobilization, Scar mobilization, Vestibular training, Cryotherapy, and Moist heat.  PLAN FOR NEXT SESSION: Assess and progress HEP as indicated, strengthening, flexibility, manual/dry needling as indicated    Reather Laurence, PT, DPT 11/21/23, 1:27 PM  [facility] [address] [place], [place] [address] Phone # [phone] Fax [phone]

## 2023-11-22 ENCOUNTER — Inpatient Hospital Stay: Payer: Medicare Other | Attending: Nurse Practitioner

## 2023-11-22 ENCOUNTER — Encounter: Payer: Self-pay | Admitting: Cardiology

## 2023-11-22 ENCOUNTER — Other Ambulatory Visit: Payer: Self-pay

## 2023-11-22 DIAGNOSIS — D472 Monoclonal gammopathy: Secondary | ICD-10-CM

## 2023-11-22 LAB — CMP (CANCER CENTER ONLY)
ALT: 10 U/L (ref 0–44)
AST: 15 U/L (ref 15–41)
Albumin: 4.2 g/dL (ref 3.5–5.0)
Alkaline Phosphatase: 41 U/L (ref 38–126)
Anion gap: 6 (ref 5–15)
BUN: 19 mg/dL (ref 8–23)
CO2: 29 mmol/L (ref 22–32)
Calcium: 10 mg/dL (ref 8.9–10.3)
Chloride: 104 mmol/L (ref 98–111)
Creatinine: 0.81 mg/dL (ref 0.44–1.00)
GFR, Estimated: >60 mL/min (ref >60)
Glucose, Bld: 108 mg/dL — ABNORMAL HIGH (ref 70–99)
Potassium: 4 mmol/L (ref 3.5–5.1)
Sodium: 139 mmol/L (ref 135–145)
Total Bilirubin: 0.4 mg/dL (ref 0.0–1.2)
Total Protein: 7.8 g/dL (ref 6.5–8.1)

## 2023-11-22 LAB — CBC WITH DIFFERENTIAL (CANCER CENTER ONLY)
Abs Immature Granulocytes: 0.02 10*3/uL (ref 0.00–0.07)
Basophils Absolute: 0 10*3/uL (ref 0.0–0.1)
Basophils Relative: 0 %
Eosinophils Absolute: 0.4 10*3/uL (ref 0.0–0.5)
Eosinophils Relative: 7 %
HCT: 35.3 % — ABNORMAL LOW (ref 36.0–46.0)
Hemoglobin: 11.7 g/dL — ABNORMAL LOW (ref 12.0–15.0)
Immature Granulocytes: 0 %
Lymphocytes Relative: 28 %
Lymphs Abs: 1.6 10*3/uL (ref 0.7–4.0)
MCH: 30.2 pg (ref 26.0–34.0)
MCHC: 33.1 g/dL (ref 30.0–36.0)
MCV: 91 fL (ref 80.0–100.0)
Monocytes Absolute: 0.3 10*3/uL (ref 0.1–1.0)
Monocytes Relative: 6 %
Neutro Abs: 3.2 10*3/uL (ref 1.7–7.7)
Neutrophils Relative %: 59 %
Platelet Count: 246 10*3/uL (ref 150–400)
RBC: 3.88 MIL/uL (ref 3.87–5.11)
RDW: 14.1 % (ref 11.5–15.5)
WBC Count: 5.5 10*3/uL (ref 4.0–10.5)
nRBC: 0 % (ref 0.0–0.2)

## 2023-11-23 ENCOUNTER — Ambulatory Visit: Payer: Medicare Other | Admitting: Rehabilitative and Restorative Service Providers"

## 2023-11-23 ENCOUNTER — Encounter: Payer: Self-pay | Admitting: Rehabilitative and Restorative Service Providers"

## 2023-11-23 DIAGNOSIS — S32010D Wedge compression fracture of first lumbar vertebra, subsequent encounter for fracture with routine healing: Secondary | ICD-10-CM | POA: Diagnosis not present

## 2023-11-23 DIAGNOSIS — M6281 Muscle weakness (generalized): Secondary | ICD-10-CM

## 2023-11-23 DIAGNOSIS — R252 Cramp and spasm: Secondary | ICD-10-CM

## 2023-11-23 DIAGNOSIS — M5459 Other low back pain: Secondary | ICD-10-CM

## 2023-11-23 LAB — KAPPA/LAMBDA LIGHT CHAINS
Kappa free light chain: 234.2 mg/L — ABNORMAL HIGH (ref 3.3–19.4)
Kappa, lambda light chain ratio: 73.19 — ABNORMAL HIGH (ref 0.26–1.65)
Lambda free light chains: 3.2 mg/L — ABNORMAL LOW (ref 5.7–26.3)

## 2023-11-23 NOTE — Therapy (Signed)
OUTPATIENT PHYSICAL THERAPY TREATMENT NOTE   Patient Name: Mary Potter MRN: [id number] DOB:1946-09-12, 78 y.o., female Today's Date: 11/23/2023  END OF SESSION:  PT End of Session - 11/23/23 1233     Visit Number 3    Date for PT Re-Evaluation 01/13/24    Authorization Type Medicare    Progress Note Due on Visit 10    PT Start Time 1230    PT Stop Time 1310    PT Time Calculation (min) 40 min    Activity Tolerance Patient tolerated treatment well    Behavior During Therapy [facility] for tasks assessed/performed             Past Medical History:  Diagnosis Date   Atrial fibrillation (HCC)    Dysrhythmia    A-fib   Endometrial hyperplasia 01/03/2015   GERD (gastroesophageal reflux disease)    History of kidney stones    Hyperlipidemia    Hypertension    Pneumonia    PONV (postoperative nausea and vomiting)    slow to wake up   Thickened endometrium 01/03/2015   Vaginal delivery 1976, 1985   Past Surgical History:  Procedure Laterality Date   ANKLE FRACTURE SURGERY     ATRIAL FIBRILLATION ABLATION N/A 02/08/2022   Procedure: ATRIAL FIBRILLATION ABLATION;  Surgeon: Lanier Prude, MD;  [facility];  Service: Cardiovascular;  Laterality: N/A;   CARDIOVERSION N/A 02/17/2023   Procedure: CARDIOVERSION;  Surgeon: Thomasene Ripple, DO;  [facility]: [facility];  Service: Cardiovascular;  Laterality: N/A;   CATARACT EXTRACTION Right 2020   DILATION AND CURETTAGE OF UTERUS  2012   ablation    EYE SURGERY     Corneal growth removal on right eye   HYSTEROSCOPY WITH D & C N/A 01/03/2015   Procedure: DILATATION AND CURETTAGE /HYSTEROSCOPY;  Surgeon: Shea Evans, MD;  Location: [facility];  Service: Gynecology;  Laterality: N/A;   LAPAROSCOPY     for endometriosis   ROBOTIC ASSISTED TOTAL HYSTERECTOMY WITH BILATERAL SALPINGO OOPHERECTOMY N/A 03/17/2021   Procedure: XI ROBOTIC ASSISTED TOTAL HYSTERECTOMY WITH BILATERAL SALPINGO OOPHORECTOMY;  Surgeon: Carver Fila, MD;  Location: [facility];  Service: Gynecology;  Laterality: N/A;   SENTINEL NODE BIOPSY N/A 03/17/2021   Procedure: SENTINEL NODE BIOPSY;  Surgeon: Carver Fila, MD;  Location: [facility];  Service: Gynecology;  Laterality: N/A;   TONSILLECTOMY AND ADENOIDECTOMY     Patient Active Problem List   Diagnosis Date Noted   Coronary artery disease involving native coronary artery of native heart without angina pectoris 03/28/2023   Hypercoagulable state due to paroxysmal atrial fibrillation (HCC) 03/02/2023   Persistent atrial fibrillation (HCC) 03/02/2023   Endometrial adenocarcinoma (HCC) 03/20/2021   Complex endometrial hyperplasia with atypia 02/18/2021   PAF (paroxysmal atrial fibrillation) (HCC) 10/15/2020   Elevated coronary artery calcium score 09/14/2020   Essential hypertension 09/14/2020   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 06/29/2018   Greater trochanteric bursitis of left hip 04/18/2018   Acute left lumbar radiculopathy 03/21/2018   Iliotibial band tendinitis of right side 09/30/2016   Trigger finger, right middle finger 09/30/2016   Thickened endometrium 01/03/2015   Endometrial hyperplasia 01/03/2015   Tibialis posterior tendonitis 05/09/2014   H/O ankle fusion 05/09/2014   Left leg pain 04/17/2014   Lipid disorder 11/14/2012   GERD 09/11/2010   CONSTIPATION 09/11/2010   DIARRHEA 09/11/2010    PCP: Alysia Penna, MD  REFERRING PROVIDER: Floreen Comber, NP  REFERRING  DIAG: S32.010A (ICD-10-CM) - Wedge compression fracture of first lumbar vertebra (HCC)  Rationale for Evaluation and Treatment: Rehabilitation  THERAPY DIAG:  Other low back pain  Cramp and spasm  Muscle weakness (generalized)  ONSET DATE: s/p kyphoplasty on 09/05/2023  SUBJECTIVE:                                                                                                                                                                                            SUBJECTIVE STATEMENT: Patient reports that she started using her floor bike some.  Reports some soreness after PT, but attributes to recent inactivity due to procedure.  PERTINENT HISTORY:  Right Ankle Fusion, s/p kyphoplasty on 09/05/2023, osteopenia Pt states that she is being worked up at the [facility] [employer] [facility] to rule out Multiple Myeloma  PAIN:  Are you having pain? Yes: NPRS scale: 1-2/10 Pain location: right flank area of low back Pain description: soreness Aggravating factors: walking, reaching Relieving factors: sitting  PRECAUTIONS: None  RED FLAGS: Compression fracture: Yes: s/p kyphoplasty    WEIGHT BEARING RESTRICTIONS: No  FALLS:  Has patient fallen in last 6 months? No  LIVING ENVIRONMENT: Lives with: lives alone Lives in: House/apartment Stairs:  one home Has following equipment at home: Grab bars  OCCUPATION: Retired  PLOF: Independent and Leisure: gardening, reading, walking her dog, socializing with friends  PATIENT GOALS: To be stronger to be able to return to gardening and other desired tasks.  NEXT MD VISIT: As Needed  OBJECTIVE:  Note: Objective measures were completed at Evaluation unless otherwise noted.  DIAGNOSTIC FINDINGS:  Lumbar CT on 08/19/2023: IMPRESSION: 1. Redemonstrated subacute appearing compression fractures of the L1 and L2 superior endplates with slight interval progression of height loss at L1, now moderate. Unchanged moderate height loss at L2. 2. Mild spinal canal stenosis at T12-L1, L1-L2, and L3-L4. 3. Moderate spinal canal stenosis at L4-L5. 4. Cholelithiasis.  PATIENT SURVEYS:  Eval:  FOTO 51 (projected 76 by visit 12)  COGNITION: Overall cognitive status: Within functional limits for tasks assessed     SENSATION: Patient denies and numbness or tingling  MUSCLE LENGTH: Hamstrings: Tightness bilaterally  POSTURE: rounded shoulders, forward head, and flexed trunk    LUMBAR ROM:   Eval:  Patient with painful  lumbar ROM, limited at least 50%  LOWER EXTREMITY ROM:     WFL  LOWER EXTREMITY MMT:    Eval:   Right LE strength is grossly WFL Left hip strength is 4/5  LUMBAR SPECIAL TESTS:  Slump test: Negative  FUNCTIONAL TESTS:  Eval: 5 times sit to stand: 15.07 sec 3 minute walk test: 560 ft  with slight increase in pain in right flank area  GAIT: Distance walked: >500 ft Assistive device utilized: None Level of assistance: Complete Independence Comments: Patient with antalgic gait pattern with decreased step length and Trendelenburg gait  TODAY'S TREATMENT  DATE: 11/23/2023 Nustep level 4 x6 min with PT present to discuss status Seated transversus abdominus with hands pressing ball into thighs 2x10 Seated hip adduction ball squeeze 2x10 Seated with 2# ankle weights:  LAQ, marching, hip ER.  2x10 each bilat Sit to/from stand on foam pad 2x5 Standing at barre with 2# ankle weights:  marching, hip abduction, hip extension.  2x10 each bilat Seated green pball rollout 10x5 sec hold Standing balance on rocker board (with one foot on each side) x2 min   DATE: 11/21/2023 Nustep level 4 x6 min with PT present to discuss status Seated hip adduction ball squeeze 2x10 Seated transversus abdominus with hands pressing ball into thighs 2x10 Seated with 2# ankle weights:  LAQ, marching, hip ER.  2x10 each bilat Seated piriformis stretch 2x20 sec bilat Sit to/from stand on foam pad 2x5 Seated green pball rollout 5x5 sec hold Supine straight leg raise with 2# ankle weights.  2x10 bilat Supine hamstring, hip adductor, and IT band stretch with strap x20 sec each bilat Supine lower trunk rotation x10 Supine transversus abdominus contraction with marching 2x10 Supine isometric dead bug 5x10 sec hold   DATE: 11/18/2023 Seated hamstring stretch x20 sec bilat Seated piriformis stretch x20 sec bilat Seated transversus abdominus contraction x10 Standing L stretch x20 sec   PATIENT EDUCATION:   Education details: Issued HEP and provided handout and education on dry needling Person educated: Patient Education method: Programmer, multimedia, Facilities manager, and Handouts Education comprehension: verbalized understanding and returned demonstration  HOME EXERCISE PROGRAM: Access Code: [email or web address] URL: [email or web address] Date: 11/21/2023 Prepared by: Clydie Braun Gen Clagg  Exercises - Seated Hamstring Stretch  - 1 x daily - 7 x weekly - 2 reps - 20 sec hold - Seated Piriformis Stretch with Trunk Bend  - 1 x daily - 7 x weekly - 2 reps - 20 sec hold - Seated Transversus Abdominis Bracing  - 1 x daily - 7 x weekly - 2 sets - 10 reps - Standing 'L' Stretch at Counter  - 1 x daily - 7 x weekly - 2 reps - 20 sec  hold - Seated Long Arc Quad  - 1 x daily - 7 x weekly - 2 sets - 10 reps - Sit to Stand Without Arm Support  - 1 x daily - 7 x weekly - 5 sets - 5-10 reps - Supine Lower Trunk Rotation  - 1 x daily - 7 x weekly - 10 reps - Supine March with Posterior Pelvic Tilt  - 1 x daily - 7 x weekly - 2 sets - 10 reps - Isometric Dead Bug  - 1 x daily - 7 x weekly - 5 reps - 10 sec hold  ASSESSMENT:  CLINICAL IMPRESSION: Ms Vendetti presents to skilled PT reporting some soreness following last session.  States that she has been doing her new HEP.  Patient able to progress with LE strengthening exercises and standing balance during today's session. Required use of UE support of barre during standing exercises for improved stability.  Patient with some difficulty with standing hip extension secondary to tightness and weakness.  Patient with good participation throughout session and able to make corrections to form when cued by PT.  Patient continues to require skilled PT to progress towards goal related  activities.  OBJECTIVE IMPAIRMENTS: decreased balance, difficulty walking, decreased strength, increased muscle spasms, impaired flexibility, postural dysfunction, and pain.   ACTIVITY LIMITATIONS:  carrying, lifting, bending, standing, and squatting  PARTICIPATION LIMITATIONS: cleaning, shopping, community activity, and yard work  PERSONAL FACTORS: Past/current experiences and 3+ comorbidities: s/p lumbar kyphoplasty, right ankle fusion, osteopenia  are also affecting patient's functional outcome.   REHAB POTENTIAL: Good  CLINICAL DECISION MAKING: Stable/uncomplicated  EVALUATION COMPLEXITY: Low   GOALS: Goals reviewed with patient? Yes  SHORT TERM GOALS: Target date: 12/09/2023  Patient will be independent with initial HEP. Baseline: Goal status: Met on 11/23/23  2.  Patient will report at least a 25% improvement in symptoms since starting PT. Baseline:  Goal status: INITIAL   LONG TERM GOALS: Target date: 01/13/2024  Patient will be independent with advanced HEP to allow for self progression after discharge. Baseline:  Goal status: INITIAL  2.  Patient will increase LE and trunk functional strength to [facility] to allow her to lift buckets of dirt for gardening in the Spring. Baseline:  Goal status: INITIAL  3.  Patient will report ability to return to walking her dog daily on previous route without increased pain. Baseline:  Goal status: INITIAL  4.  Patient will increase FOTO to at least 61 to demonstrate improved functional mobility. Baseline: 51 Goal status: INITIAL  5.  Patient to report ability to return to going out to dinner or other outings with her friends without increased pain or muscle spasms.  Baseline:   Goal status:  INITIAL    PLAN:  PT FREQUENCY: 2x/week  PT DURATION: 8 weeks  PLANNED INTERVENTIONS: 97164- PT Re-evaluation, 97110-Therapeutic exercises, 97530- Therapeutic activity, 97112- Neuromuscular re-education, 97535- Self Care, 45409- Manual therapy, [phone]- Gait training, [phone]- Canalith repositioning, [id number]- Aquatic Therapy, 97014- Electrical stimulation (unattended), [id number]- Electrical stimulation (manual), [id number]- Ultrasound,  Patient/Family education, Balance training, Stair training, Taping, Dry Needling, Joint mobilization, Joint manipulation, Spinal manipulation, Spinal mobilization, Scar mobilization, Vestibular training, Cryotherapy, and Moist heat.  PLAN FOR NEXT SESSION: Assess and progress HEP as indicated, strengthening, core stability, flexibility, manual/dry needling as indicated    Reather Laurence, PT, DPT 11/23/23, 1:17 PM  [facility] [address] [place], [place] [address] Phone # [phone] Fax [phone]

## 2023-11-24 ENCOUNTER — Telehealth: Payer: Self-pay | Admitting: *Deleted

## 2023-11-24 NOTE — Telephone Encounter (Signed)
Tried calling patient to schedule telephone visit no answer left a vm to call back

## 2023-11-24 NOTE — Telephone Encounter (Signed)
Pharmacy please advise on holding Eliquis prior to bilateral upper blepharoplasty and ptosis repair scheduled for 2//25. Thank you.

## 2023-11-24 NOTE — Telephone Encounter (Signed)
Patient with diagnosis of afib on Eliquis for anticoagulation.    Procedure:  Bilateral Upper Lid Blepharoplasty  and Ptosis Repair  Date of procedure: 12/06/23   CHA2DS2-VASc Score = 5   This indicates a 7.2% annual risk of stroke. The patient's score is based upon: CHF History: 0 HTN History: 1 Diabetes History: 0 Stroke History: 0 Vascular Disease History: 1 Age Score: 2 Gender Score: 1      CrCl 66 ml/min Platelet count 246  Per office protocol, patient can hold Eliquis for 2 days prior to procedure.    **This guidance is not considered finalized until pre-operative APP has relayed final recommendations.**

## 2023-11-24 NOTE — Telephone Encounter (Signed)
   Name: Mary Potter  DOB: [birth year]  MRN: [id number]  Primary Cardiologist: Rollene Rotunda, MD   Preoperative team, please contact this patient and set up a phone call appointment for further preoperative risk assessment. Please obtain consent and complete medication review. Thank you for your help.  I confirm that guidance regarding antiplatelet and oral anticoagulation therapy has been completed and, if necessary, noted below.  Per office protocol, patient can hold Eliquis for 2 days prior to procedure.    I also confirmed the patient resides in the state of [place]. As per [facility] Medical Board telemedicine laws, the patient must reside in the state in which the provider is licensed.   Napoleon Form, Leodis Rains, NP 11/24/2023, 12:54 PM [facility]

## 2023-11-24 NOTE — Telephone Encounter (Signed)
   Pre-operative Risk Assessment    Patient Name: Mary Potter  DOB: 1946/05/19 MRN: [id number]   Date of last office visit: 09/22/2023 Date of next office visit: None   Request for Surgical Clearance    Procedure:   Bilateral Upper Lid Blepharoplasty  and Ptosis Repair  Date of Surgery:  Clearance 12/06/23                                 Surgeon:  Not Indicated Surgeon's Group or Practice Name:  [facility]  Phone number:  [phone] Fax number:  [phone]   Type of Clearance Requested:   - Medical  - Pharmacy:  Hold Apixaban (Eliquis) Not Indicated.    Type of Anesthesia:  Not Indicated   Additional requests/questions:    Signed, Emmit Pomfret   11/24/2023, 12:03 PM

## 2023-11-25 ENCOUNTER — Telehealth: Payer: Self-pay

## 2023-11-25 ENCOUNTER — Telehealth: Payer: Self-pay | Admitting: Cardiology

## 2023-11-25 NOTE — Telephone Encounter (Signed)
Patient has been scheduled for pre op clearance.

## 2023-11-25 NOTE — Telephone Encounter (Signed)
Patient was returning phone call

## 2023-11-25 NOTE — Telephone Encounter (Signed)
Called patient back and scheduled patient for preop clearance

## 2023-11-25 NOTE — Telephone Encounter (Signed)
Patient has been scheduled for preop clearance med rec and consent done     Patient Consent for Virtual Visit         Mary Potter has provided verbal consent on 11/25/2023 for a virtual visit (video or telephone).   CONSENT FOR VIRTUAL VISIT FOR:  Mary Potter  By participating in this virtual visit I agree to the following:  I hereby voluntarily request, consent and authorize [facility] and its employed or contracted physicians, physician assistants, nurse practitioners or other licensed health care professionals (the Practitioner), to provide me with telemedicine health care services (the "Services") as deemed necessary by the treating Practitioner. I acknowledge and consent to receive the Services by the Practitioner via telemedicine. I understand that the telemedicine visit will involve communicating with the Practitioner through live audiovisual communication technology and the disclosure of certain medical information by electronic transmission. I acknowledge that I have been given the opportunity to request an in-person assessment or other available alternative prior to the telemedicine visit and am voluntarily participating in the telemedicine visit.  I understand that I have the right to withhold or withdraw my consent to the use of telemedicine in the course of my care at any time, without affecting my right to future care or treatment, and that the Practitioner or I may terminate the telemedicine visit at any time. I understand that I have the right to inspect all information obtained and/or recorded in the course of the telemedicine visit and may receive copies of available information for a reasonable fee.  I understand that some of the potential risks of receiving the Services via telemedicine include:  Delay or interruption in medical evaluation due to technological equipment failure or disruption; Information transmitted may not be sufficient (e.g. poor  resolution of images) to allow for appropriate medical decision making by the Practitioner; and/or  In rare instances, security protocols could fail, causing a breach of personal health information.  Furthermore, I acknowledge that it is my responsibility to provide information about my medical history, conditions and care that is complete and accurate to the best of my ability. I acknowledge that Practitioner's advice, recommendations, and/or decision may be based on factors not within their control, such as incomplete or inaccurate data provided by me or distortions of diagnostic images or specimens that may result from electronic transmissions. I understand that the practice of medicine is not an exact science and that Practitioner makes no warranties or guarantees regarding treatment outcomes. I acknowledge that a copy of this consent can be made available to me via my patient portal [facility] MyChart), or I can request a printed copy by calling the office of [facility].    I understand that my insurance will be billed for this visit.   I have read or had this consent read to me. I understand the contents of this consent, which adequately explains the benefits and risks of the Services being provided via telemedicine.  I have been provided ample opportunity to ask questions regarding this consent and the Services and have had my questions answered to my satisfaction. I give my informed consent for the services to be provided through the use of telemedicine in my medical care

## 2023-11-27 LAB — MULTIPLE MYELOMA PANEL, SERUM
Albumin SerPl Elph-Mcnc: 3.8 g/dL (ref 2.9–4.4)
Albumin/Glob SerPl: 1.1 (ref 0.7–1.7)
Alpha 1: 0.2 g/dL (ref 0.0–0.4)
Alpha2 Glob SerPl Elph-Mcnc: 0.8 g/dL (ref 0.4–1.0)
B-Globulin SerPl Elph-Mcnc: 1.2 g/dL (ref 0.7–1.3)
Gamma Glob SerPl Elph-Mcnc: 1.5 g/dL (ref 0.4–1.8)
Globulin, Total: 3.7 g/dL (ref 2.2–3.9)
IgA: 1259 mg/dL — ABNORMAL HIGH (ref 64–422)
IgG (Immunoglobin G), Serum: 568 mg/dL — ABNORMAL LOW (ref 586–1602)
IgM (Immunoglobulin M), Srm: 22 mg/dL — ABNORMAL LOW (ref 26–217)
M Protein SerPl Elph-Mcnc: 0.8 g/dL — ABNORMAL HIGH
Total Protein ELP: 7.5 g/dL (ref 6.0–8.5)

## 2023-11-27 NOTE — Therapy (Signed)
OUTPATIENT PHYSICAL THERAPY TREATMENT NOTE   Patient Name: Mary Potter MRN: [id number] DOB:Mar 08, 1946, 78 y.o., female Today's Date: 11/28/2023  END OF SESSION:  PT End of Session - 11/28/23 1400     Visit Number 4    Date for PT Re-Evaluation 01/13/24    Authorization Type Medicare    Progress Note Due on Visit 10    PT Start Time 1400    PT Stop Time 1445    PT Time Calculation (min) 45 min              Past Medical History:  Diagnosis Date   Atrial fibrillation (HCC)    Dysrhythmia    A-fib   Endometrial hyperplasia 01/03/2015   GERD (gastroesophageal reflux disease)    History of kidney stones    Hyperlipidemia    Hypertension    Pneumonia    PONV (postoperative nausea and vomiting)    slow to wake up   Thickened endometrium 01/03/2015   Vaginal delivery 1976, 1985   Past Surgical History:  Procedure Laterality Date   ANKLE FRACTURE SURGERY     ATRIAL FIBRILLATION ABLATION N/A 02/08/2022   Procedure: ATRIAL FIBRILLATION ABLATION;  Surgeon: Lanier Prude, MD;  Location: [facility];  Service: Cardiovascular;  Laterality: N/A;   CARDIOVERSION N/A 02/17/2023   Procedure: CARDIOVERSION;  Surgeon: Thomasene Ripple, DO;  Location: [facility];  Service: Cardiovascular;  Laterality: N/A;   CATARACT EXTRACTION Right 2020   DILATION AND CURETTAGE OF UTERUS  2012   ablation    EYE SURGERY     Corneal growth removal on right eye   HYSTEROSCOPY WITH D & C N/A 01/03/2015   Procedure: DILATATION AND CURETTAGE /HYSTEROSCOPY;  Surgeon: Shea Evans, MD;  Location: [facility];  Service: Gynecology;  Laterality: N/A;   LAPAROSCOPY     for endometriosis   ROBOTIC ASSISTED TOTAL HYSTERECTOMY WITH BILATERAL SALPINGO OOPHERECTOMY N/A 03/17/2021   Procedure: XI ROBOTIC ASSISTED TOTAL HYSTERECTOMY WITH BILATERAL SALPINGO OOPHORECTOMY;  Surgeon: Carver Fila, MD;  Location: [facility];  Service: Gynecology;  Laterality: N/A;   SENTINEL NODE BIOPSY N/A 03/17/2021    Procedure: SENTINEL NODE BIOPSY;  Surgeon: Carver Fila, MD;  Location: [facility];  Service: Gynecology;  Laterality: N/A;   TONSILLECTOMY AND ADENOIDECTOMY     Patient Active Problem List   Diagnosis Date Noted   Coronary artery disease involving native coronary artery of native heart without angina pectoris 03/28/2023   Hypercoagulable state due to paroxysmal atrial fibrillation (HCC) 03/02/2023   Persistent atrial fibrillation (HCC) 03/02/2023   Endometrial adenocarcinoma (HCC) 03/20/2021   Complex endometrial hyperplasia with atypia 02/18/2021   PAF (paroxysmal atrial fibrillation) (HCC) 10/15/2020   Elevated coronary artery calcium score 09/14/2020   Essential hypertension 09/14/2020   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 06/29/2018   Greater trochanteric bursitis of left hip 04/18/2018   Acute left lumbar radiculopathy 03/21/2018   Iliotibial band tendinitis of right side 09/30/2016   Trigger finger, right middle finger 09/30/2016   Thickened endometrium 01/03/2015   Endometrial hyperplasia 01/03/2015   Tibialis posterior tendonitis 05/09/2014   H/O ankle fusion 05/09/2014   Left leg pain 04/17/2014   Lipid disorder 11/14/2012   GERD 09/11/2010   CONSTIPATION 09/11/2010   DIARRHEA 09/11/2010    PCP: Alysia Penna, MD  REFERRING PROVIDER: Floreen Comber, NP  REFERRING DIAG: S32.010A (ICD-10-CM) - Wedge compression fracture of first lumbar vertebra (HCC)  Rationale for Evaluation and Treatment:  Rehabilitation  THERAPY DIAG:  Other low back pain  Cramp and spasm  Muscle weakness (generalized)  ONSET DATE: s/p kyphoplasty on 09/05/2023  SUBJECTIVE:                                                                                                                                                                                           SUBJECTIVE STATEMENT: Patient reporting ongoing R lumbar pain. She says it is less side pain and now more  toward the back.  PERTINENT HISTORY:  Right Ankle Fusion, s/p kyphoplasty on 09/05/2023, osteopenia Pt states that she is being worked up at the [facility] to rule out Multiple Myeloma  PAIN:  Are you having pain? Yes: NPRS scale: 3/10 Pain location: right flank area of low back Pain description: soreness Aggravating factors: walking, reaching Relieving factors: sitting  PRECAUTIONS: None  RED FLAGS: Compression fracture: Yes: s/p kyphoplasty    WEIGHT BEARING RESTRICTIONS: No  FALLS:  Has patient fallen in last 6 months? No  LIVING ENVIRONMENT: Lives with: lives alone Lives in: House/apartment Stairs:  one home Has following equipment at home: Grab bars  OCCUPATION: Retired  PLOF: Independent and Leisure: gardening, reading, walking her dog, socializing with friends  PATIENT GOALS: To be stronger to be able to return to gardening and other desired tasks.  NEXT MD VISIT: As Needed  OBJECTIVE:  Note: Objective measures were completed at Evaluation unless otherwise noted.  DIAGNOSTIC FINDINGS:  Lumbar CT on 08/19/2023: IMPRESSION: 1. Redemonstrated subacute appearing compression fractures of the L1 and L2 superior endplates with slight interval progression of height loss at L1, now moderate. Unchanged moderate height loss at L2. 2. Mild spinal canal stenosis at T12-L1, L1-L2, and L3-L4. 3. Moderate spinal canal stenosis at L4-L5. 4. Cholelithiasis.  PATIENT SURVEYS:  Eval:  FOTO 51 (projected 47 by visit 12)  COGNITION: Overall cognitive status: Within functional limits for tasks assessed     SENSATION: Patient denies and numbness or tingling  MUSCLE LENGTH: Hamstrings: Tightness bilaterally  POSTURE: rounded shoulders, forward head, and flexed trunk    LUMBAR ROM:   Eval:  Patient with painful lumbar ROM, limited at least 50%  LOWER EXTREMITY ROM:     WFL  LOWER EXTREMITY MMT:    Eval:   Right LE strength is grossly WFL Left hip strength is  4/5  LUMBAR SPECIAL TESTS:  Slump test: Negative  FUNCTIONAL TESTS:  Eval: 5 times sit to stand: 15.07 sec 3 minute walk test: 560 ft with slight increase in pain in right flank area  GAIT: Distance walked: >500 ft Assistive device utilized: None Level of assistance: Complete  Independence Comments: Patient with antalgic gait pattern with decreased step length and Trendelenburg gait  TODAY'S TREATMENT  DATE: 11/28/2023 Nustep level 4 x6 min with PT present to discuss status Seated transversus abdominus with hands pressing ball into thighs 2x10 Seated hip adduction ball squeeze 2x10 Seated hip IR B with ball between knees x 20 Seated with 2# ankle weights:  LAQ, marching.  2x10 each bilat STS x 10 Standing at barre with 2# ankle weights:  marching, hip abduction, hip extension.  2x10 each bilat Seated green pball rollout 10x5 sec hold Standing balance on rocker board (with one foot on each side) x2 min Self MFR with ball to R QL and gluteals.  Trigger Point Dry Needling  Initial Treatment: Pt instructed on Dry Needling rational, procedures, and possible side effects. Pt instructed to expect mild to moderate muscle soreness later in the day and/or into the next day.  Pt instructed in methods to reduce muscle soreness. Pt instructed to continue prescribed HEP. Because Dry Needling was performed over or adjacent to a lung field, pt was educated on S/S of pneumothorax and to seek immediate medical attention should they occur.  Patient was educated on signs and symptoms of infection and other risk factors and advised to seek medical attention should they occur.  Patient verbalized understanding of these instructions and education.   Patient Verbal Consent Given: Yes Education Handout Provided: Previously Provided Muscles Treated: R QL Electrical Stimulation Performed: No Treatment Response/Outcome: Utilized skilled palpation to identify bony landmarks and trigger points.  Able to  illicit twitch response and muscle elongation.  Soft tissue mobilization to R QL  following to further promote tissue elongation and decreased pain.       DATE: 11/23/2023 Nustep level 4 x6 min with PT present to discuss status Seated transversus abdominus with hands pressing ball into thighs 2x10 Seated hip adduction ball squeeze 2x10 Seated with 2# ankle weights:  LAQ, marching, hip ER.  2x10 each bilat Sit to/from stand on foam pad 2x5 Standing at barre with 2# ankle weights:  marching, hip abduction, hip extension.  2x10 each bilat Seated green pball rollout 10x5 sec hold Standing balance on rocker board (with one foot on each side) x2 min   DATE: 11/21/2023 Nustep level 4 x6 min with PT present to discuss status Seated hip adduction ball squeeze 2x10 Seated transversus abdominus with hands pressing ball into thighs 2x10 Seated with 2# ankle weights:  LAQ, marching, hip ER.  2x10 each bilat Seated piriformis stretch 2x20 sec bilat Sit to/from stand on foam pad 2x5 Seated green pball rollout 5x5 sec hold Supine straight leg raise with 2# ankle weights.  2x10 bilat Supine hamstring, hip adductor, and IT band stretch with strap x20 sec each bilat Supine lower trunk rotation x10 Supine transversus abdominus contraction with marching 2x10 Supine isometric dead bug 5x10 sec hold   DATE: 11/18/2023 Seated hamstring stretch x20 sec bilat Seated piriformis stretch x20 sec bilat Seated transversus abdominus contraction x10 Standing L stretch x20 sec   PATIENT EDUCATION:  Education details: Issued HEP and provided handout and education on dry needling Person educated: Patient Education method: Programmer, multimedia, Facilities manager, and Handouts Education comprehension: verbalized understanding and returned demonstration  HOME EXERCISE PROGRAM: Access Code: [email or web address] URL: [email or web address] Date: 11/21/2023 Prepared by: Clydie Braun Menke  Exercises - Seated Hamstring Stretch   - 1 x daily - 7 x weekly - 2 reps - 20 sec hold - Seated Piriformis Stretch with Trunk Bend  - 1 x daily -  7 x weekly - 2 reps - 20 sec hold - Seated Transversus Abdominis Bracing  - 1 x daily - 7 x weekly - 2 sets - 10 reps - Standing 'L' Stretch at Counter  - 1 x daily - 7 x weekly - 2 reps - 20 sec  hold - Seated Long Arc Quad  - 1 x daily - 7 x weekly - 2 sets - 10 reps - Sit to Stand Without Arm Support  - 1 x daily - 7 x weekly - 5 sets - 5-10 reps - Supine Lower Trunk Rotation  - 1 x daily - 7 x weekly - 10 reps - Supine March with Posterior Pelvic Tilt  - 1 x daily - 7 x weekly - 2 sets - 10 reps - Isometric Dead Bug  - 1 x daily - 7 x weekly - 5 reps - 10 sec hold  ASSESSMENT:  CLINICAL IMPRESSION: Lorrane presents with ongoing R sided lumbar pain. Pt educated on DN and after consent the R QL was treated with excellent localized twitch response. She reported decreased tension upon standing as well. Able to tolerate all TE without complaint. She did report some pain after standing hip ext in the low back.   OBJECTIVE IMPAIRMENTS: decreased balance, difficulty walking, decreased strength, increased muscle spasms, impaired flexibility, postural dysfunction, and pain.   ACTIVITY LIMITATIONS: carrying, lifting, bending, standing, and squatting  PARTICIPATION LIMITATIONS: cleaning, shopping, community activity, and yard work  PERSONAL FACTORS: Past/current experiences and 3+ comorbidities: s/p lumbar kyphoplasty, right ankle fusion, osteopenia  are also affecting patient's functional outcome.   REHAB POTENTIAL: Good  CLINICAL DECISION MAKING: Stable/uncomplicated  EVALUATION COMPLEXITY: Low   GOALS: Goals reviewed with patient? Yes  SHORT TERM GOALS: Target date: 12/09/2023  Patient will be independent with initial HEP. Baseline: Goal status: Met on 11/23/23  2.  Patient will report at least a 25% improvement in symptoms since starting PT. Baseline:  Goal status:  INITIAL   LONG TERM GOALS: Target date: 01/13/2024  Patient will be independent with advanced HEP to allow for self progression after discharge. Baseline:  Goal status: INITIAL  2.  Patient will increase LE and trunk functional strength to [facility] to allow her to lift buckets of dirt for gardening in the Spring. Baseline:  Goal status: INITIAL  3.  Patient will report ability to return to walking her dog daily on previous route without increased pain. Baseline:  Goal status: INITIAL  4.  Patient will increase FOTO to at least 61 to demonstrate improved functional mobility. Baseline: 51 Goal status: INITIAL  5.  Patient to report ability to return to going out to dinner or other outings with her friends without increased pain or muscle spasms.  Baseline:   Goal status:  INITIAL    PLAN:  PT FREQUENCY: 2x/week  PT DURATION: 8 weeks  PLANNED INTERVENTIONS: 97164- PT Re-evaluation, 97110-Therapeutic exercises, 97530- Therapeutic activity, 97112- Neuromuscular re-education, 97535- Self Care, 78295- Manual therapy, [phone]- Gait training, [phone]- Canalith repositioning, [id number]- Aquatic Therapy, 97014- Electrical stimulation (unattended), [id number]- Electrical stimulation (manual), [id number]- Ultrasound, Patient/Family education, Balance training, Stair training, Taping, Dry Needling, Joint mobilization, Joint manipulation, Spinal manipulation, Spinal mobilization, Scar mobilization, Vestibular training, Cryotherapy, and Moist heat.  PLAN FOR NEXT SESSION: Assess response to DN, progress HEP as indicated, strengthening, core stability, flexibility, manual/dry needling as indicated   Solon Palm, PT  11/28/23, 2:47 PM  [facility] [address] [place], [place] [address] Phone # [phone]  Fax [phone]

## 2023-11-28 ENCOUNTER — Encounter: Payer: Self-pay | Admitting: Physical Therapy

## 2023-11-28 ENCOUNTER — Ambulatory Visit: Payer: Medicare Other | Admitting: Physical Therapy

## 2023-11-28 DIAGNOSIS — M5459 Other low back pain: Secondary | ICD-10-CM

## 2023-11-28 DIAGNOSIS — S32010D Wedge compression fracture of first lumbar vertebra, subsequent encounter for fracture with routine healing: Secondary | ICD-10-CM | POA: Diagnosis not present

## 2023-11-28 DIAGNOSIS — M6281 Muscle weakness (generalized): Secondary | ICD-10-CM

## 2023-11-28 DIAGNOSIS — R252 Cramp and spasm: Secondary | ICD-10-CM

## 2023-11-29 ENCOUNTER — Inpatient Hospital Stay (HOSPITAL_BASED_OUTPATIENT_CLINIC_OR_DEPARTMENT_OTHER): Payer: Medicare Other | Admitting: Hematology

## 2023-11-29 DIAGNOSIS — D472 Monoclonal gammopathy: Secondary | ICD-10-CM

## 2023-11-29 NOTE — Progress Notes (Signed)
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  Today, she is accompanied by her daughter and son-in-law. Patient has had two vertebral compression fractures in her L-spine. She reports that her initial back fracture was from pulling too hard while adjusting her shoes, and the second occurred after pushing on a trash can lid.   Her back pain continues to be bothersome, though it has improved. She did previously have mild pain raidating to her left lower extremity, which has resolved. Back brace does improve back pain.  The first time she had a compression fracture, she was told that she was developing the beginnings of osteopenia. Patient reports that she did not tolerate Fosamax and she stopped taking it. She has not had a bone density study recently. Her last bone density study from 2018 showed osteopenia. Patient has never been on prolia previously, though there are considerations to start it soon.   She reports having a muscle spasm on her left side, which is settling with muscle relaxants. She denies any leg swelling.   Patient reports a history of kidney stones, during which she had protein in her urine.  Patient has regularly been on vitamin D. She has been off of calcium for several years ago because of her fhx of  heart disease.   INTERVAL HISTORY: Mary Potter is a wonderful 78 y.o. 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   .I connected with Mary Potter on 08/29/2023 at  8:40 AM EST by telephone visit and verified that I am speaking with the correct person using two identifiers.   I last connected with the patient on 08/25/2023 and she was doing well overall.   Patient notes she has been doing well overall without any new or severe medical concerns since our last visit. She denies any new infection issues, fever, chills, night sweats, unexpected weight loss, abdominal pain, chest pain, or leg swelling.   She notes that her back has significantly improved with physical therapy.   She recently had bone density study done which showed osteoporosis. She has started taking her calcium and vitamin-D supplements. Patient has also started taking Prolia.   Patient notes she does not want to get an PET scan or Bone marrow biopsy right now.   Discussed lab results from 11/22/2023 in detail with the patient.   I discussed the limitations, risks, security and privacy concerns of performing an evaluation and management service by telemedicine and the availability of in-person appointments. I also discussed with the patient that there may be a patient responsible charge related to this service. The patient expressed understanding and agreed to proceed.   Other persons participating in the visit and their role in the encounter: Husband   Patient's location: Home  Provider's location: [facility]   Chief Complaint:  biclonal gammopathy.     MEDICAL HISTORY:  Past Medical History:  Diagnosis Date  Atrial fibrillation (HCC)    Dysrhythmia    A-fib   Endometrial hyperplasia 01/03/2015   GERD (gastroesophageal reflux disease)    History of kidney stones    Hyperlipidemia    Hypertension    Pneumonia    PONV (postoperative nausea and vomiting)    slow to  wake up   Thickened endometrium 01/03/2015   Vaginal delivery 1976, 1985    SURGICAL HISTORY: Past Surgical History:  Procedure Laterality Date   ANKLE FRACTURE SURGERY     ATRIAL FIBRILLATION ABLATION N/A 02/08/2022   Procedure: ATRIAL FIBRILLATION ABLATION;  Surgeon: Mary Prude, MD;  Location: [facility];  Service: Cardiovascular;  Laterality: N/A;   CARDIOVERSION N/A 02/17/2023   Procedure: CARDIOVERSION;  Surgeon: Mary Ripple, DO;  Location: [facility];  Service: Cardiovascular;  Laterality: N/A;   CATARACT EXTRACTION Right 2020   DILATION AND CURETTAGE OF UTERUS  2012   ablation    EYE SURGERY     Corneal growth removal on right eye   HYSTEROSCOPY WITH D & C N/A 01/03/2015   Procedure: DILATATION AND CURETTAGE /HYSTEROSCOPY;  Surgeon: Mary Evans, MD;  Location: [facility];  Service: Gynecology;  Laterality: N/A;   LAPAROSCOPY     for endometriosis   ROBOTIC ASSISTED TOTAL HYSTERECTOMY WITH BILATERAL SALPINGO OOPHERECTOMY N/A 03/17/2021   Procedure: XI ROBOTIC ASSISTED TOTAL HYSTERECTOMY WITH BILATERAL SALPINGO OOPHORECTOMY;  Surgeon: Mary Fila, MD;  Location: [facility];  Service: Gynecology;  Laterality: N/A;   SENTINEL NODE BIOPSY N/A 03/17/2021   Procedure: SENTINEL NODE BIOPSY;  Surgeon: Mary Fila, MD;  Location: [facility];  Service: Gynecology;  Laterality: N/A;   TONSILLECTOMY AND ADENOIDECTOMY      SOCIAL HISTORY: Social History   Socioeconomic History   Marital status: Widowed    Spouse name: Mary Potter   Number of children: 2   Years of education: 14   Highest education level: Not on file  Occupational History   Occupation: retired    Comment: [employer]  Tobacco Use   Smoking status: Never   Smokeless tobacco: Never   Tobacco comments:    Never smoke 03/02/23  Vaping Use   Vaping status: Never Used  Substance and Sexual Activity   Alcohol use: No    Alcohol/week: 0.0 standard drinks of alcohol   Drug use: No   Sexual activity: Yes     Birth control/protection: Post-menopausal  Other Topics Concern   Not on file  Social History Narrative   Lives with her younger daughter, [name]. Her older daughter lives in [place] with her family.   Social Drivers of Corporate investment banker Strain: Not on file  Food Insecurity: Not on file  Transportation Needs: Not on file  Physical Activity: Not on file  Stress: Not on file  Social Connections: Not on file  Intimate Partner Violence: Not on file    FAMILY HISTORY: Family History  Problem Relation Age of Onset   Heart disease Mother 69       CABG   Stroke Father    Prostate cancer Father    Heart disease Brother 66       Transplant, died 10 years   Heart disease Maternal Grandmother    Heart disease Paternal Grandmother    Cancer Paternal Grandfather    Heart disease Brother 15       RF   Leukemia Brother    Leukemia Brother    Hypertension Sister    Cervical  cancer Sister    Colon cancer Neg Hx    Breast cancer Neg Hx    Ovarian cancer Neg Hx    Endometrial cancer Neg Hx    Pancreatic cancer Neg Hx     ALLERGIES:  is allergic to talwin [pentazocine], hydrocodone, and prednisone.  MEDICATIONS:  Current Outpatient Medications  Medication Sig Dispense Refill   acetaminophen (TYLENOL) 500 MG tablet Take 1,000 mg by mouth every 6 (six) hours as needed (Arthritis Pain).     ALPRAZolam (XANAX) 0.5 MG tablet Take 0.25 mg by mouth daily as needed (anxiousness).     apixaban (ELIQUIS) 5 MG TABS tablet Take 1 tablet (5 mg total) by mouth 2 (two) times daily. 180 tablet 3   Cholecalciferol (VITAMIN D) 50 MCG (2000 UT) CAPS Take 2,000 Units by mouth at bedtime.     cyclobenzaprine (FLEXERIL) 10 MG tablet Take 1 tablet (10 mg total) by mouth 2 (two) times daily as needed for muscle spasms. (Patient not taking: Reported on 09/20/2023) 20 tablet 0   estradiol (VIVELLE-DOT) 0.025 MG/24HR PLACE 1 PATCH ONTO THE SKIN 2 (TWO) TIMES A WEEK. SUNDAYS & WEDNESDAYS 8  patch 0   lidocaine (HM LIDOCAINE PATCH) 4 % Place 1 patch onto the skin daily. (Patient not taking: Reported on 09/20/2023) 10 patch 0   methocarbamol (ROBAXIN) 500 MG tablet Take 500 mg by mouth every 8 (eight) hours as needed. (Patient not taking: Reported on 09/20/2023)     metoprolol tartrate (LOPRESSOR) 50 MG tablet TAKE ONE TABLET BY MOUTH TWICE A DAY 180 tablet 3   olmesartan (BENICAR) 40 MG tablet Take 40 mg by mouth daily.     oxyCODONE (OXY IR/ROXICODONE) 5 MG immediate release tablet Take 5 mg by mouth every 8 (eight) hours as needed for moderate pain. (Patient not taking: Reported on 09/20/2023)     Polyethyl Glycol-Propyl Glycol (SYSTANE OP) Place 1 drop into both eyes daily.     rosuvastatin (CRESTOR) 20 MG tablet Take 1 tablet (20 mg total) by mouth every evening. 90 tablet 3   No current facility-administered medications for this visit.    REVIEW OF SYSTEMS:    10 Point review of Systems was done is negative except as noted above.  PHYSICAL EXAMINATION: TELE-MED VISIT  LABORATORY DATA:  I have reviewed the data as listed  .    Latest Ref Rng & Units 11/22/2023    1:29 PM 08/19/2023    9:23 AM 08/04/2023   12:40 PM  CBC  WBC 4.0 - 10.5 K/uL 5.5  8.5  8.6   Hemoglobin 12.0 - 15.0 g/dL 30.8  65.7  84.6   Hematocrit 36.0 - 46.0 % 35.3  36.6  38.9   Platelets 150 - 400 K/uL 246  262  338     .    Latest Ref Rng & Units 11/22/2023    1:29 PM 08/19/2023    9:23 AM 08/04/2023   12:40 PM  CMP  Glucose 70 - 99 mg/dL 962  952  841   BUN 8 - 23 mg/dL 19  10  14    Creatinine 0.44 - 1.00 mg/dL 3.24  4.01  0.27   Sodium 135 - 145 mmol/L 139  137  137   Potassium 3.5 - 5.1 mmol/L 4.0  3.8  4.1   Chloride 98 - 111 mmol/L 104  104  103   CO2 22 - 32 mmol/L 29  21  27    Calcium 8.9 - 10.3 mg/dL 10.0  8.4  10.2   Total Protein 6.5 - 8.1 g/dL 7.8   8.0   Total Bilirubin 0.0 - 1.2 mg/dL 0.4   0.4   Alkaline Phos 38 - 126 U/L 41   73   AST 15 - 41 U/L 15   16   ALT 0 - 44  U/L 10   14    IFE/PE, serum 07/21/2023:   RADIOGRAPHIC STUDIES: I have personally reviewed the radiological images as listed and agreed with the findings in the report. No results found.  ASSESSMENT & PLAN:   78 y.o. female with:  IgA kappa biclonal gammopathy  PLAN: -Discussed lab results from 11/22/2023 in detail with the patient. CBC shows patient is mildly anemic with hemoglobin of 11.7 g/dL with hematocrit of 16.1%. Stable CMP.  -Discussed multiple myeloma panel results from 11/22/2023 in detail with the patient. Showed M-protein of 0.8, same as previous panel result.  -Discussed Kappa/lambda light chain ratio results from 11/22/2023. Showed elevated but improved kappa/lambda light chain ratio of 73.19%,about the same as last time.  -Patient wants to hold on to PET scan and Bone marrow biopsy.  -Bone density showed osteoporosis.  -Continue calcium and vitamin-d supplements.  -Continue Prolia.  -Continue to follow-up with other physicians.  -No sign of progression.  -Answered all of patient's questions.  Labs in 16 weeks RTC with Dr Candise Che in 18 weeks  FOLLOW-UP: Labs in 16 weeks RTC with Dr Candise Che in 18 weeks  The total time spent in the appointment was 20 minutes* .  All of the patient's questions were answered with apparent satisfaction. The patient knows to call the clinic with any problems, questions or concerns.   Wyvonnia Lora MD MS AAHIVMS [facility] Hematology/Oncology Physician [facility]  .*Total Encounter Time as defined by the Centers for Medicare and Medicaid Services includes, in addition to the face-to-face time of a patient visit (documented in the note above) non-face-to-face time: obtaining and reviewing outside history, ordering and reviewing medications, tests or procedures, care coordination (communications with other health care professionals or caregivers) and documentation in the medical record.   I,Param Shah,acting as a Neurosurgeon for  Wyvonnia Lora, MD.,have documented all relevant documentation on the behalf of Wyvonnia Lora, MD,as directed by  Wyvonnia Lora, MD while in the presence of Wyvonnia Lora, MD.   .I have reviewed the above documentation for accuracy and completeness, and I agree with the above. Johney Maine MD

## 2023-11-30 ENCOUNTER — Ambulatory Visit: Payer: Medicare Other | Attending: Physician Assistant | Admitting: Physician Assistant

## 2023-11-30 DIAGNOSIS — Z01810 Encounter for preprocedural cardiovascular examination: Secondary | ICD-10-CM | POA: Insufficient documentation

## 2023-11-30 NOTE — Progress Notes (Signed)
   Virtual Visit via Telephone Note   Because of Mary Potter's co-morbid illnesses, she is at least at moderate risk for complications without adequate follow up.  This format is felt to be most appropriate for this patient at this time.  The patient did not have access to video technology/had technical difficulties with video requiring transitioning to audio format only (telephone).  All issues noted in this document were discussed and addressed.  No physical exam could be performed with this format.  Please refer to the patient's chart for her consent to telehealth for [facility].  Evaluation Performed:  Preoperative cardiovascular risk assessment _____________   Date:  11/30/2023   Patient ID:  Mary, Potter 1946-03-11, MRN [id number] Patient Location:  Home- [place] Provider location:   Office  Primary Care Provider:  Alysia Penna, MD Primary Cardiologist:  Rollene Rotunda, MD    Chief Complaint / Patient Profile   78 y.o. y/o female with a h/o   Coronary artery calcification Pre-PVI ablation CT: CAC score 118 (63rd percentile) Paroxysmal atrial fibrillation S/p PVI ablation Persistent atrial flutter TTE 04/27/2018: EF 60-65, mild MR Hypertension Abnormal EKG  She is pending bilateral upper lid blepharoplasty and ptosis repair on 12/06/2023 at [facility] (type of anesthesia unknown) and presents today for telephonic preoperative cardiovascular risk assessment.  Her history was reviewed by our Pharm.D. to help make recommendations regarding perioperative anticoagulation management. Per office protocol, patient can hold Eliquis for 2 days prior to procedure.   History of Present Illness    Mary Potter is a 78 y.o. female who presents via audio/video conferencing for a telehealth visit today.  Pt was last seen in cardiology clinic on 09/20/2023 by Dr. Antoine Poche.  At that time SUNDY HOUCHINS was doing well.  The  patient is now pending procedure as outlined above. Since her last visit, she has done well without chest pain, shortness of breath or syncope    Physical Exam    Vital Signs:  Mary Potter does not have vital signs available for review today.  Given telephonic nature of communication, physical exam is limited. AAOx3. NAD. Normal affect.  Speech and respirations are unlabored.  Accessory Clinical Findings    None    Assessment & Plan    Assessment & Plan Preoperative cardiovascular examination Ms. Kolbe's perioperative risk of a major cardiac event is 0.4% according to the Revised Cardiac Risk Index (RCRI).  Therefore, she is at low risk for perioperative complications.   Her functional capacity is good at 4.31 METs according to the Duke Activity Status Index (DASI). Recommendations: According to ACC/AHA guidelines, no further cardiovascular testing needed.  The patient may proceed to surgery at acceptable risk.   Antiplatelet and/or Anticoagulation Recommendations: Eliquis (Apixaban) can be held for 2 days prior to surgery.  Please resume post op when felt to be safe.     A copy of this note will be routed to requesting surgeon.  Time:   Today, I have spent 3.5 minutes with the patient with telehealth technology discussing medical history, symptoms, and management plan.     Tereso Newcomer, PA-C 11/30/2023, 3:18 PM

## 2023-11-30 NOTE — Telephone Encounter (Signed)
Notes faxed to surgeon. This phone note will be removed from the preop pool. Tereso Newcomer, PA-C  11/30/2023 3:18 PM

## 2023-12-01 ENCOUNTER — Ambulatory Visit: Payer: Medicare Other | Admitting: Rehabilitative and Restorative Service Providers"

## 2023-12-01 ENCOUNTER — Telehealth: Payer: Self-pay | Admitting: Hematology

## 2023-12-01 ENCOUNTER — Encounter: Payer: Self-pay | Admitting: Rehabilitative and Restorative Service Providers"

## 2023-12-01 DIAGNOSIS — M6281 Muscle weakness (generalized): Secondary | ICD-10-CM | POA: Diagnosis not present

## 2023-12-01 DIAGNOSIS — S32010D Wedge compression fracture of first lumbar vertebra, subsequent encounter for fracture with routine healing: Secondary | ICD-10-CM | POA: Diagnosis not present

## 2023-12-01 DIAGNOSIS — R252 Cramp and spasm: Secondary | ICD-10-CM

## 2023-12-01 DIAGNOSIS — M5459 Other low back pain: Secondary | ICD-10-CM

## 2023-12-01 NOTE — Telephone Encounter (Signed)
Spoke with patient confirming upcoming appointments

## 2023-12-01 NOTE — Therapy (Signed)
OUTPATIENT PHYSICAL THERAPY TREATMENT NOTE   Patient Name: Mary Potter MRN: [id number] DOB:08-May-1946, 78 y.o., female Today's Date: 12/01/2023  END OF SESSION:  PT End of Session - 12/01/23 1019     Visit Number 5    Date for PT Re-Evaluation 01/13/24    Authorization Type Medicare    Progress Note Due on Visit 10    PT Start Time 1015    PT Stop Time 1055    PT Time Calculation (min) 40 min    Activity Tolerance Patient tolerated treatment well    Behavior During Therapy [facility] for tasks assessed/performed              Past Medical History:  Diagnosis Date   Atrial fibrillation (HCC)    Dysrhythmia    A-fib   Endometrial hyperplasia 01/03/2015   GERD (gastroesophageal reflux disease)    History of kidney stones    Hyperlipidemia    Hypertension    Pneumonia    PONV (postoperative nausea and vomiting)    slow to wake up   Thickened endometrium 01/03/2015   Vaginal delivery 1976, 1985   Past Surgical History:  Procedure Laterality Date   ANKLE FRACTURE SURGERY     ATRIAL FIBRILLATION ABLATION N/A 02/08/2022   Procedure: ATRIAL FIBRILLATION ABLATION;  Surgeon: Lanier Prude, MD;  Location: [facility];  Service: Cardiovascular;  Laterality: N/A;   CARDIOVERSION N/A 02/17/2023   Procedure: CARDIOVERSION;  Surgeon: Thomasene Ripple, DO;  Location: [facility];  Service: Cardiovascular;  Laterality: N/A;   CATARACT EXTRACTION Right 2020   DILATION AND CURETTAGE OF UTERUS  2012   ablation    EYE SURGERY     Corneal growth removal on right eye   HYSTEROSCOPY WITH D & C N/A 01/03/2015   Procedure: DILATATION AND CURETTAGE /HYSTEROSCOPY;  Surgeon: Shea Evans, MD;  Location: [facility];  Service: Gynecology;  Laterality: N/A;   LAPAROSCOPY     for endometriosis   ROBOTIC ASSISTED TOTAL HYSTERECTOMY WITH BILATERAL SALPINGO OOPHERECTOMY N/A 03/17/2021   Procedure: XI ROBOTIC ASSISTED TOTAL HYSTERECTOMY WITH BILATERAL SALPINGO OOPHORECTOMY;  Surgeon: Carver Fila, MD;  Location: [facility];  Service: Gynecology;  Laterality: N/A;   SENTINEL NODE BIOPSY N/A 03/17/2021   Procedure: SENTINEL NODE BIOPSY;  Surgeon: Carver Fila, MD;  Location: [facility];  Service: Gynecology;  Laterality: N/A;   TONSILLECTOMY AND ADENOIDECTOMY     Patient Active Problem List   Diagnosis Date Noted   Coronary artery disease involving native coronary artery of native heart without angina pectoris 03/28/2023   Hypercoagulable state due to paroxysmal atrial fibrillation (HCC) 03/02/2023   Persistent atrial fibrillation (HCC) 03/02/2023   Endometrial adenocarcinoma (HCC) 03/20/2021   Complex endometrial hyperplasia with atypia 02/18/2021   PAF (paroxysmal atrial fibrillation) (HCC) 10/15/2020   Elevated coronary artery calcium score 09/14/2020   Essential hypertension 09/14/2020   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 06/29/2018   Greater trochanteric bursitis of left hip 04/18/2018   Acute left lumbar radiculopathy 03/21/2018   Iliotibial band tendinitis of right side 09/30/2016   Trigger finger, right middle finger 09/30/2016   Thickened endometrium 01/03/2015   Endometrial hyperplasia 01/03/2015   Tibialis posterior tendonitis 05/09/2014   H/O ankle fusion 05/09/2014   Left leg pain 04/17/2014   Lipid disorder 11/14/2012   GERD 09/11/2010   CONSTIPATION 09/11/2010   DIARRHEA 09/11/2010    PCP: Alysia Penna, MD  REFERRING PROVIDER: Floreen Comber, NP  REFERRING DIAG: S32.010A (ICD-10-CM) - Wedge compression fracture of first lumbar vertebra (HCC)  Rationale for Evaluation and Treatment: Rehabilitation  THERAPY DIAG:  Other low back pain  Cramp and spasm  Muscle weakness (generalized)  ONSET DATE: s/p kyphoplasty on 09/05/2023  SUBJECTIVE:                                                                                                                                                                                            SUBJECTIVE STATEMENT: Patient states that she was a little sore after dry needling, but uncertain how much it helped, as she has been walking a lot at the hospital.  States that her daughter ended up being hospitalized secondary to rhabdomyolysis.  PERTINENT HISTORY:  Right Ankle Fusion, s/p kyphoplasty on 09/05/2023, osteopenia Pt states that she is being worked up at the [facility] to rule out Multiple Myeloma  PAIN:  Are you having pain? Yes: NPRS scale: 5-6/10 Pain location: right flank area of low back Pain description: soreness Aggravating factors: walking, reaching Relieving factors: sitting  PRECAUTIONS: None  RED FLAGS: Compression fracture: Yes: s/p kyphoplasty    WEIGHT BEARING RESTRICTIONS: No  FALLS:  Has patient fallen in last 6 months? No  LIVING ENVIRONMENT: Lives with: lives alone Lives in: House/apartment Stairs:  one home Has following equipment at home: Grab bars  OCCUPATION: Retired  PLOF: Independent and Leisure: gardening, reading, walking her dog, socializing with friends  PATIENT GOALS: To be stronger to be able to return to gardening and other desired tasks.  NEXT MD VISIT: As Needed  OBJECTIVE:  Note: Objective measures were completed at Evaluation unless otherwise noted.  DIAGNOSTIC FINDINGS:  Lumbar CT on 08/19/2023: IMPRESSION: 1. Redemonstrated subacute appearing compression fractures of the L1 and L2 superior endplates with slight interval progression of height loss at L1, now moderate. Unchanged moderate height loss at L2. 2. Mild spinal canal stenosis at T12-L1, L1-L2, and L3-L4. 3. Moderate spinal canal stenosis at L4-L5. 4. Cholelithiasis.  PATIENT SURVEYS:  Eval:  FOTO 51 (projected 71 by visit 12)  COGNITION: Overall cognitive status: Within functional limits for tasks assessed     SENSATION: Patient denies and numbness or tingling  MUSCLE LENGTH: Hamstrings: Tightness bilaterally  POSTURE: rounded shoulders,  forward head, and flexed trunk    LUMBAR ROM:   Eval:  Patient with painful lumbar ROM, limited at least 50%  LOWER EXTREMITY ROM:     WFL  LOWER EXTREMITY MMT:    Eval:   Right LE strength is grossly WFL Left hip strength is 4/5  LUMBAR SPECIAL TESTS:  Slump test: Negative  FUNCTIONAL  TESTS:  Eval: 5 times sit to stand: 15.07 sec 3 minute walk test: 560 ft with slight increase in pain in right flank area  GAIT: Distance walked: >500 ft Assistive device utilized: None Level of assistance: Complete Independence Comments: Patient with antalgic gait pattern with decreased step length and Trendelenburg gait  TODAY'S TREATMENT  DATE: 12/01/2023 Nustep level 4 x6 min with PT present to discuss status Seated green pball rollout 10x5 sec hold Seated transversus abdominus with hands pressing ball into thighs 2x10 Seated hip adduction ball squeeze 2x10 Seated with 2# ankle weights:  LAQ, marching, hip ER.  2x10 each bilat Sit to/from stand on foam pad 2x10 Standing at barre with 2# ankle weights:  marching, hip abduction, hip extension.  2x10 each bilat Seated hamstring stretch 2x20 sec bilat   DATE: 11/28/2023 Nustep level 4 x6 min with PT present to discuss status Seated transversus abdominus with hands pressing ball into thighs 2x10 Seated hip adduction ball squeeze 2x10 Seated hip IR B with ball between knees x 20 Seated with 2# ankle weights:  LAQ, marching.  2x10 each bilat STS x 10 Standing at barre with 2# ankle weights:  marching, hip abduction, hip extension.  2x10 each bilat Seated green pball rollout 10x5 sec hold Standing balance on rocker board (with one foot on each side) x2 min Self MFR with ball to R QL and gluteals.  Trigger Point Dry Needling  Initial Treatment: Pt instructed on Dry Needling rational, procedures, and possible side effects. Pt instructed to expect mild to moderate muscle soreness later in the day and/or into the next day.  Pt  instructed in methods to reduce muscle soreness. Pt instructed to continue prescribed HEP. Because Dry Needling was performed over or adjacent to a lung field, pt was educated on S/S of pneumothorax and to seek immediate medical attention should they occur.  Patient was educated on signs and symptoms of infection and other risk factors and advised to seek medical attention should they occur.  Patient verbalized understanding of these instructions and education.   Patient Verbal Consent Given: Yes Education Handout Provided: Previously Provided Muscles Treated: R QL Electrical Stimulation Performed: No Treatment Response/Outcome: Utilized skilled palpation to identify bony landmarks and trigger points.  Able to illicit twitch response and muscle elongation.  Soft tissue mobilization to R QL  following to further promote tissue elongation and decreased pain.       DATE: 11/23/2023 Nustep level 4 x6 min with PT present to discuss status Seated transversus abdominus with hands pressing ball into thighs 2x10 Seated hip adduction ball squeeze 2x10 Seated with 2# ankle weights:  LAQ, marching, hip ER.  2x10 each bilat Sit to/from stand on foam pad 2x5 Standing at barre with 2# ankle weights:  marching, hip abduction, hip extension.  2x10 each bilat Seated green pball rollout 10x5 sec hold Standing balance on rocker board (with one foot on each side) x2 min     PATIENT EDUCATION:  Education details: Issued HEP and provided handout and education on dry needling Person educated: Patient Education method: Explanation, Demonstration, and Handouts Education comprehension: verbalized understanding and returned demonstration  HOME EXERCISE PROGRAM: Access Code: [email or web address] URL: [email or web address] Date: 11/21/2023 Prepared by: Clydie Braun Daine Croker  Exercises - Seated Hamstring Stretch  - 1 x daily - 7 x weekly - 2 reps - 20 sec hold - Seated Piriformis Stretch with Trunk Bend  - 1 x  daily - 7 x weekly - 2 reps - 20 sec hold -  Seated Transversus Abdominis Bracing  - 1 x daily - 7 x weekly - 2 sets - 10 reps - Standing 'L' Stretch at Counter  - 1 x daily - 7 x weekly - 2 reps - 20 sec  hold - Seated Long Arc Quad  - 1 x daily - 7 x weekly - 2 sets - 10 reps - Sit to Stand Without Arm Support  - 1 x daily - 7 x weekly - 5 sets - 5-10 reps - Supine Lower Trunk Rotation  - 1 x daily - 7 x weekly - 10 reps - Supine March with Posterior Pelvic Tilt  - 1 x daily - 7 x weekly - 2 sets - 10 reps - Isometric Dead Bug  - 1 x daily - 7 x weekly - 5 reps - 10 sec hold  ASSESSMENT:  CLINICAL IMPRESSION: Ms Warwick presents to skilled PT reporting some soreness secondary to walking around the hospital a lot to check on her daughter.  Patient with good participation throughout session.  Did not increase weights today secondary to soreness from increased ambulation.  Patient only require min cuing throughout for improved technique.  Patient continues to require skilled PT to progress towards goal related activities.  OBJECTIVE IMPAIRMENTS: decreased balance, difficulty walking, decreased strength, increased muscle spasms, impaired flexibility, postural dysfunction, and pain.   ACTIVITY LIMITATIONS: carrying, lifting, bending, standing, and squatting  PARTICIPATION LIMITATIONS: cleaning, shopping, community activity, and yard work  PERSONAL FACTORS: Past/current experiences and 3+ comorbidities: s/p lumbar kyphoplasty, right ankle fusion, osteopenia  are also affecting patient's functional outcome.   REHAB POTENTIAL: Good  CLINICAL DECISION MAKING: Stable/uncomplicated  EVALUATION COMPLEXITY: Low   GOALS: Goals reviewed with patient? Yes  SHORT TERM GOALS: Target date: 12/09/2023  Patient will be independent with initial HEP. Baseline: Goal status: Met on 11/23/23  2.  Patient will report at least a 25% improvement in symptoms since starting PT. Baseline:  Goal status:  Ongoing   LONG TERM GOALS: Target date: 01/13/2024  Patient will be independent with advanced HEP to allow for self progression after discharge. Baseline:  Goal status: Ongoing  2.  Patient will increase LE and trunk functional strength to [facility] to allow her to lift buckets of dirt for gardening in the Spring. Baseline:  Goal status: INITIAL  3.  Patient will report ability to return to walking her dog daily on previous route without increased pain. Baseline:  Goal status: INITIAL  4.  Patient will increase FOTO to at least 61 to demonstrate improved functional mobility. Baseline: 51 Goal status: INITIAL  5.  Patient to report ability to return to going out to dinner or other outings with her friends without increased pain or muscle spasms.  Baseline:   Goal status:  INITIAL    PLAN:  PT FREQUENCY: 2x/week  PT DURATION: 8 weeks  PLANNED INTERVENTIONS: 97164- PT Re-evaluation, 97110-Therapeutic exercises, 97530- Therapeutic activity, 97112- Neuromuscular re-education, 97535- Self Care, 16109- Manual therapy, [phone]- Gait training, [phone]- Canalith repositioning, [id number]- Aquatic Therapy, 97014- Electrical stimulation (unattended), [id number]- Electrical stimulation (manual), [id number]- Ultrasound, Patient/Family education, Balance training, Stair training, Taping, Dry Needling, Joint mobilization, Joint manipulation, Spinal manipulation, Spinal mobilization, Scar mobilization, Vestibular training, Cryotherapy, and Moist heat.  PLAN FOR NEXT SESSION: Assess response to DN, progress HEP as indicated, strengthening, core stability, flexibility, manual/dry needling as indicated   Reather Laurence, PT, DPT 12/01/23, 11:22 AM  [facility] [address] [place], [place] [address] Phone # [phone] Fax  [phone]  

## 2023-12-04 NOTE — Therapy (Signed)
OUTPATIENT PHYSICAL THERAPY TREATMENT NOTE   Patient Name: Mary Potter MRN: [id number] DOB:June 19, 1946, 78 y.o., female Today's Date: 12/05/2023  END OF SESSION:  PT End of Session - 12/05/23 1320     Visit Number 6    Date for PT Re-Evaluation 01/13/24    Authorization Type Medicare    Progress Note Due on Visit 10    PT Start Time 1234    PT Stop Time 1316    PT Time Calculation (min) 42 min    Activity Tolerance Patient tolerated treatment well    Behavior During Therapy [facility] for tasks assessed/performed               Past Medical History:  Diagnosis Date   Atrial fibrillation (HCC)    Dysrhythmia    A-fib   Endometrial hyperplasia 01/03/2015   GERD (gastroesophageal reflux disease)    History of kidney stones    Hyperlipidemia    Hypertension    Pneumonia    PONV (postoperative nausea and vomiting)    slow to wake up   Thickened endometrium 01/03/2015   Vaginal delivery 1976, 1985   Past Surgical History:  Procedure Laterality Date   ANKLE FRACTURE SURGERY     ATRIAL FIBRILLATION ABLATION N/A 02/08/2022   Procedure: ATRIAL FIBRILLATION ABLATION;  Surgeon: Lanier Prude, MD;  Location: [facility];  Service: Cardiovascular;  Laterality: N/A;   CARDIOVERSION N/A 02/17/2023   Procedure: CARDIOVERSION;  Surgeon: Thomasene Ripple, DO;  Location: [facility];  Service: Cardiovascular;  Laterality: N/A;   CATARACT EXTRACTION Right 2020   DILATION AND CURETTAGE OF UTERUS  2012   ablation    EYE SURGERY     Corneal growth removal on right eye   HYSTEROSCOPY WITH D & C N/A 01/03/2015   Procedure: DILATATION AND CURETTAGE /HYSTEROSCOPY;  Surgeon: Shea Evans, MD;  Location: [facility];  Service: Gynecology;  Laterality: N/A;   LAPAROSCOPY     for endometriosis   ROBOTIC ASSISTED TOTAL HYSTERECTOMY WITH BILATERAL SALPINGO OOPHERECTOMY N/A 03/17/2021   Procedure: XI ROBOTIC ASSISTED TOTAL HYSTERECTOMY WITH BILATERAL SALPINGO OOPHORECTOMY;  Surgeon: Carver Fila, MD;  Location: [facility];  Service: Gynecology;  Laterality: N/A;   SENTINEL NODE BIOPSY N/A 03/17/2021   Procedure: SENTINEL NODE BIOPSY;  Surgeon: Carver Fila, MD;  Location: [facility];  Service: Gynecology;  Laterality: N/A;   TONSILLECTOMY AND ADENOIDECTOMY     Patient Active Problem List   Diagnosis Date Noted   Coronary artery disease involving native coronary artery of native heart without angina pectoris 03/28/2023   Hypercoagulable state due to paroxysmal atrial fibrillation (HCC) 03/02/2023   Persistent atrial fibrillation (HCC) 03/02/2023   Endometrial adenocarcinoma (HCC) 03/20/2021   Complex endometrial hyperplasia with atypia 02/18/2021   PAF (paroxysmal atrial fibrillation) (HCC) 10/15/2020   Elevated coronary artery calcium score 09/14/2020   Essential hypertension 09/14/2020   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 06/29/2018   Greater trochanteric bursitis of left hip 04/18/2018   Acute left lumbar radiculopathy 03/21/2018   Iliotibial band tendinitis of right side 09/30/2016   Trigger finger, right middle finger 09/30/2016   Thickened endometrium 01/03/2015   Endometrial hyperplasia 01/03/2015   Tibialis posterior tendonitis 05/09/2014   H/O ankle fusion 05/09/2014   Left leg pain 04/17/2014   Lipid disorder 11/14/2012   GERD 09/11/2010   CONSTIPATION 09/11/2010   DIARRHEA 09/11/2010    PCP: Alysia Penna, MD  REFERRING PROVIDER: Floreen Comber, NP  REFERRING DIAG: S32.010A (ICD-10-CM) - Wedge compression fracture of first lumbar vertebra (HCC)  Rationale for Evaluation and Treatment: Rehabilitation  THERAPY DIAG:  Other low back pain  Cramp and spasm  Muscle weakness (generalized)  ONSET DATE: s/p kyphoplasty on 09/05/2023  SUBJECTIVE:                                                                                                                                                                                            SUBJECTIVE STATEMENT: Just sore the past few days. Have been taking 1/2 a muscle relaxer in the morning and evening and using heat which has helped.  PERTINENT HISTORY:  Right Ankle Fusion, s/p kyphoplasty on 09/05/2023, osteopenia Pt states that she is being worked up at the [facility] to rule out Multiple Myeloma  PAIN:  Are you having pain? Yes: NPRS scale: 5-6/10 Pain location: right flank area of low back Pain description: soreness Aggravating factors: walking, reaching Relieving factors: sitting  PRECAUTIONS: None  RED FLAGS: Compression fracture: Yes: s/p kyphoplasty    WEIGHT BEARING RESTRICTIONS: No  FALLS:  Has patient fallen in last 6 months? No  LIVING ENVIRONMENT: Lives with: lives alone Lives in: House/apartment Stairs:  one home Has following equipment at home: Grab bars  OCCUPATION: Retired  PLOF: Independent and Leisure: gardening, reading, walking her dog, socializing with friends  PATIENT GOALS: To be stronger to be able to return to gardening and other desired tasks.  NEXT MD VISIT: As Needed  OBJECTIVE:  Note: Objective measures were completed at Evaluation unless otherwise noted.  DIAGNOSTIC FINDINGS:  Lumbar CT on 08/19/2023: IMPRESSION: 1. Redemonstrated subacute appearing compression fractures of the L1 and L2 superior endplates with slight interval progression of height loss at L1, now moderate. Unchanged moderate height loss at L2. 2. Mild spinal canal stenosis at T12-L1, L1-L2, and L3-L4. 3. Moderate spinal canal stenosis at L4-L5. 4. Cholelithiasis.  PATIENT SURVEYS:  Eval:  FOTO 51 (projected 98 by visit 12)  COGNITION: Overall cognitive status: Within functional limits for tasks assessed     SENSATION: Patient denies and numbness or tingling  MUSCLE LENGTH: Hamstrings: Tightness bilaterally  POSTURE: rounded shoulders, forward head, and flexed trunk    LUMBAR ROM:   Eval:  Patient with painful lumbar ROM, limited  at least 50%  LOWER EXTREMITY ROM:     WFL  LOWER EXTREMITY MMT:    Eval:   Right LE strength is grossly WFL Left hip strength is 4/5  LUMBAR SPECIAL TESTS:  Slump test: Negative  FUNCTIONAL TESTS:  Eval: 5 times sit to stand: 15.07 sec 3 minute walk test: 560  ft with slight increase in pain in right flank area  GAIT: Distance walked: >500 ft Assistive device utilized: None Level of assistance: Complete Independence Comments: Patient with antalgic gait pattern with decreased step length and Trendelenburg gait  TODAY'S TREATMENT  DATE: 12/05/2023 Seated with 3# ankle weights:  LAQ, marching, hip ER.  2x10 each bilat Standing at barre with 3# ankle weights:  marching (no wt on R due to form and pain in back), hip abduction, hip extension.  2x10 each bilat Sit to/from stand  x 10, then with 5# held at chest x 10 Shoulder ext red from top of stairs with TA contraction  2x10 monitor for pain Pallof press red x 10 B PPT x 3, TA contraction with single leg march - some pulling in back Diaphragmatic and box breathing to assist with relaxation for manual therapy TPR to B hip flexors with active hip flexion x 10 B    DATE: 12/01/2023 Nustep level 4 x6 min with PT present to discuss status Seated green pball rollout 10x5 sec hold Seated transversus abdominus with hands pressing ball into thighs 2x10 Seated hip adduction ball squeeze 2x10 Seated with 2# ankle weights:  LAQ, marching, hip ER.  2x10 each bilat Sit to/from stand on foam pad 2x10 Standing at barre with 2# ankle weights:  marching, hip abduction, hip extension.  2x10 each bilat Seated hamstring stretch 2x20 sec bilat   DATE: 11/28/2023 Nustep level 4 x6 min with PT present to discuss status Seated transversus abdominus with hands pressing ball into thighs 2x10 Seated hip adduction ball squeeze 2x10 Seated hip IR B with ball between knees x 20 Seated with 2# ankle weights:  LAQ, marching.  2x10 each bilat STS x  10 Standing at barre with 2# ankle weights:  marching, hip abduction, hip extension.  2x10 each bilat Seated green pball rollout 10x5 sec hold Standing balance on rocker board (with one foot on each side) x2 min Self MFR with ball to R QL and gluteals.  Trigger Point Dry Needling  Initial Treatment: Pt instructed on Dry Needling rational, procedures, and possible side effects. Pt instructed to expect mild to moderate muscle soreness later in the day and/or into the next day.  Pt instructed in methods to reduce muscle soreness. Pt instructed to continue prescribed HEP. Because Dry Needling was performed over or adjacent to a lung field, pt was educated on S/S of pneumothorax and to seek immediate medical attention should they occur.  Patient was educated on signs and symptoms of infection and other risk factors and advised to seek medical attention should they occur.  Patient verbalized understanding of these instructions and education.   Patient Verbal Consent Given: Yes Education Handout Provided: Previously Provided Muscles Treated: R QL Electrical Stimulation Performed: No Treatment Response/Outcome: Utilized skilled palpation to identify bony landmarks and trigger points.  Able to illicit twitch response and muscle elongation.  Soft tissue mobilization to R QL  following to further promote tissue elongation and decreased pain.       DATE: 11/23/2023 Nustep level 4 x6 min with PT present to discuss status Seated transversus abdominus with hands pressing ball into thighs 2x10 Seated hip adduction ball squeeze 2x10 Seated with 2# ankle weights:  LAQ, marching, hip ER.  2x10 each bilat Sit to/from stand on foam pad 2x5 Standing at barre with 2# ankle weights:  marching, hip abduction, hip extension.  2x10 each bilat Seated green pball rollout 10x5 sec hold Standing balance on rocker board (with  one foot on each side) x2 min     PATIENT EDUCATION:  Education details: Issued HEP  and provided handout and education on dry needling Person educated: Patient Education method: Explanation, Demonstration, and Handouts Education comprehension: verbalized understanding and returned demonstration  HOME EXERCISE PROGRAM: Access Code: [email or web address] URL: [email or web address] Date: 11/21/2023 Prepared by: Clydie Braun Menke  Exercises - Seated Hamstring Stretch  - 1 x daily - 7 x weekly - 2 reps - 20 sec hold - Seated Piriformis Stretch with Trunk Bend  - 1 x daily - 7 x weekly - 2 reps - 20 sec hold - Seated Transversus Abdominis Bracing  - 1 x daily - 7 x weekly - 2 sets - 10 reps - Standing 'L' Stretch at Counter  - 1 x daily - 7 x weekly - 2 reps - 20 sec  hold - Seated Long Arc Quad  - 1 x daily - 7 x weekly - 2 sets - 10 reps - Sit to Stand Without Arm Support  - 1 x daily - 7 x weekly - 5 sets - 5-10 reps - Supine Lower Trunk Rotation  - 1 x daily - 7 x weekly - 10 reps - Supine March with Posterior Pelvic Tilt  - 1 x daily - 7 x weekly - 2 sets - 10 reps - Isometric Dead Bug  - 1 x daily - 7 x weekly - 5 reps - 10 sec hold  ASSESSMENT:  CLINICAL IMPRESSION: Mary Potter reports ongoing stiffness in low back and had some pain this weekend which was addressed with meds and heat. She tolerated new standing core exercises without pain. She still has difficulty stabilizing her core with weighted hip ABD, ext and marching and needed tactile cues to avoid forward bending with ext. She does have tight and tender hip flexors R>L. Good response to TPR. Breathing techniques utilized to help with relaxing her muscles were difficult for patient and she would benefit from ongoing education.  OBJECTIVE IMPAIRMENTS: decreased balance, difficulty walking, decreased strength, increased muscle spasms, impaired flexibility, postural dysfunction, and pain.   ACTIVITY LIMITATIONS: carrying, lifting, bending, standing, and squatting  PARTICIPATION LIMITATIONS: cleaning, shopping, community  activity, and yard work  PERSONAL FACTORS: Past/current experiences and 3+ comorbidities: s/p lumbar kyphoplasty, right ankle fusion, osteopenia  are also affecting patient's functional outcome.   REHAB POTENTIAL: Good  CLINICAL DECISION MAKING: Stable/uncomplicated  EVALUATION COMPLEXITY: Low   GOALS: Goals reviewed with patient? Yes  SHORT TERM GOALS: Target date: 12/09/2023  Patient will be independent with initial HEP. Baseline: Goal status: Met on 11/23/23  2.  Patient will report at least a 25% improvement in symptoms since starting PT. Baseline:  Goal status: Ongoing   LONG TERM GOALS: Target date: 01/13/2024  Patient will be independent with advanced HEP to allow for self progression after discharge. Baseline:  Goal status: Ongoing  2.  Patient will increase LE and trunk functional strength to [facility] to allow her to lift buckets of dirt for gardening in the Spring. Baseline:  Goal status: INITIAL  3.  Patient will report ability to return to walking her dog daily on previous route without increased pain. Baseline:  Goal status: INITIAL  4.  Patient will increase FOTO to at least 61 to demonstrate improved functional mobility. Baseline: 51 Goal status: INITIAL  5.  Patient to report ability to return to going out to dinner or other outings with her friends without increased pain or muscle spasms.  Baseline:   Goal status:  INITIAL    PLAN:  PT FREQUENCY: 2x/week  PT DURATION: 8 weeks  PLANNED INTERVENTIONS: 97164- PT Re-evaluation, 97110-Therapeutic exercises, 97530- Therapeutic activity, 97112- Neuromuscular re-education, 97535- Self Care, 54098- Manual therapy, [phone]- Gait training, [phone]- Canalith repositioning, [id number]- Aquatic Therapy, 97014- Electrical stimulation (unattended), [id number]- Electrical stimulation (manual), [id number]- Ultrasound, Patient/Family education, Balance training, Stair training, Taping, Dry Needling, Joint mobilization, Joint manipulation,  Spinal manipulation, Spinal mobilization, Scar mobilization, Vestibular training, Cryotherapy, and Moist heat.  PLAN FOR NEXT SESSION: Assess response to DN, progress HEP as indicated, strengthening, core stability, flexibility, manual/dry needling as indicated   Solon Palm, PT 12/05/23, 1:41 PM  [facility] [address], [address] [place], [place] [address] Phone # [phone] Fax [phone]

## 2023-12-05 ENCOUNTER — Ambulatory Visit: Payer: Medicare Other | Attending: Student | Admitting: Physical Therapy

## 2023-12-05 ENCOUNTER — Encounter: Payer: Self-pay | Admitting: Physical Therapy

## 2023-12-05 DIAGNOSIS — M6281 Muscle weakness (generalized): Secondary | ICD-10-CM | POA: Insufficient documentation

## 2023-12-05 DIAGNOSIS — M5459 Other low back pain: Secondary | ICD-10-CM | POA: Insufficient documentation

## 2023-12-05 DIAGNOSIS — R252 Cramp and spasm: Secondary | ICD-10-CM | POA: Insufficient documentation

## 2023-12-07 ENCOUNTER — Encounter: Payer: Self-pay | Admitting: Rehabilitative and Restorative Service Providers"

## 2023-12-07 ENCOUNTER — Ambulatory Visit: Payer: Self-pay | Admitting: Rehabilitative and Restorative Service Providers"

## 2023-12-07 DIAGNOSIS — M6281 Muscle weakness (generalized): Secondary | ICD-10-CM

## 2023-12-07 DIAGNOSIS — R252 Cramp and spasm: Secondary | ICD-10-CM | POA: Diagnosis not present

## 2023-12-07 DIAGNOSIS — M5459 Other low back pain: Secondary | ICD-10-CM | POA: Diagnosis not present

## 2023-12-07 NOTE — Therapy (Signed)
 OUTPATIENT PHYSICAL THERAPY TREATMENT NOTE   Patient Name: Mary Potter MRN: [id number] DOB:14-Jun-1946, 78 y.o., female Today's Date: 12/07/2023  END OF SESSION:  PT End of Session - 12/07/23 1019     Visit Number 7    Date for PT Re-Evaluation 01/13/24    Authorization Type Medicare    Progress Note Due on Visit 10    PT Start Time 1015    PT Stop Time 1055    PT Time Calculation (min) 40 min    Activity Tolerance Patient tolerated treatment well    Behavior During Therapy [facility] for tasks assessed/performed               Past Medical History:  Diagnosis Date   Atrial fibrillation (HCC)    Dysrhythmia    A-fib   Endometrial hyperplasia 01/03/2015   GERD (gastroesophageal reflux disease)    History of kidney stones    Hyperlipidemia    Hypertension    Pneumonia    PONV (postoperative nausea and vomiting)    slow to wake up   Thickened endometrium 01/03/2015   Vaginal delivery 1976, 1985   Past Surgical History:  Procedure Laterality Date   ANKLE FRACTURE SURGERY     ATRIAL FIBRILLATION ABLATION N/A 02/08/2022   Procedure: ATRIAL FIBRILLATION ABLATION;  Surgeon: Cindie Ole DASEN, MD;  Location: [facility];  Service: Cardiovascular;  Laterality: N/A;   CARDIOVERSION N/A 02/17/2023   Procedure: CARDIOVERSION;  Surgeon: Sheena Pugh, DO;  Location: [facility];  Service: Cardiovascular;  Laterality: N/A;   CATARACT EXTRACTION Right 2020   DILATION AND CURETTAGE OF UTERUS  2012   ablation    EYE SURGERY     Corneal growth removal on right eye   HYSTEROSCOPY WITH D & C N/A 01/03/2015   Procedure: DILATATION AND CURETTAGE /HYSTEROSCOPY;  Surgeon: Robbi Render, MD;  Location: [facility];  Service: Gynecology;  Laterality: N/A;   LAPAROSCOPY     for endometriosis   ROBOTIC ASSISTED TOTAL HYSTERECTOMY WITH BILATERAL SALPINGO OOPHERECTOMY N/A 03/17/2021   Procedure: XI ROBOTIC ASSISTED TOTAL HYSTERECTOMY WITH BILATERAL SALPINGO OOPHORECTOMY;  Surgeon: Viktoria Comer SAUNDERS, MD;  Location: [facility];  Service: Gynecology;  Laterality: N/A;   SENTINEL NODE BIOPSY N/A 03/17/2021   Procedure: SENTINEL NODE BIOPSY;  Surgeon: Viktoria Comer SAUNDERS, MD;  Location: [facility];  Service: Gynecology;  Laterality: N/A;   TONSILLECTOMY AND ADENOIDECTOMY     Patient Active Problem List   Diagnosis Date Noted   Coronary artery disease involving native coronary artery of native heart without angina pectoris 03/28/2023   Hypercoagulable state due to paroxysmal atrial fibrillation (HCC) 03/02/2023   Persistent atrial fibrillation (HCC) 03/02/2023   Endometrial adenocarcinoma (HCC) 03/20/2021   Complex endometrial hyperplasia with atypia 02/18/2021   PAF (paroxysmal atrial fibrillation) (HCC) 10/15/2020   Elevated coronary artery calcium  score 09/14/2020   Essential hypertension 09/14/2020   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 06/29/2018   Greater trochanteric bursitis of left hip 04/18/2018   Acute left lumbar radiculopathy 03/21/2018   Iliotibial band tendinitis of right side 09/30/2016   Trigger finger, right middle finger 09/30/2016   Thickened endometrium 01/03/2015   Endometrial hyperplasia 01/03/2015   Tibialis posterior tendonitis 05/09/2014   H/O ankle fusion 05/09/2014   Left leg pain 04/17/2014   Lipid disorder 11/14/2012   GERD 09/11/2010   CONSTIPATION 09/11/2010   DIARRHEA 09/11/2010    PCP: Larnell Hamilton, MD  REFERRING PROVIDER: Jennetta Gerard BIRCH, NP  REFERRING DIAG: S32.010A (ICD-10-CM) - Wedge compression fracture of first lumbar vertebra (HCC)  Rationale for Evaluation and Treatment: Rehabilitation  THERAPY DIAG:  Other low back pain  Cramp and spasm  Muscle weakness (generalized)  ONSET DATE: s/p kyphoplasty on 09/05/2023  SUBJECTIVE:                                                                                                                                                                                            SUBJECTIVE STATEMENT: Patient states that she has been feeling better since taking only a 1/2 a muscle relaxant twice daily  PERTINENT HISTORY:  Right Ankle Fusion, s/p kyphoplasty on 09/05/2023, osteopenia Pt states that she is being worked up at the [facility] to rule out Multiple Myeloma  PAIN:  Are you having pain? Yes: NPRS scale: 1-2/10 Pain location: right flank area of low back Pain description: soreness Aggravating factors: walking, reaching Relieving factors: sitting  PRECAUTIONS: None  RED FLAGS: Compression fracture: Yes: s/p kyphoplasty    WEIGHT BEARING RESTRICTIONS: No  FALLS:  Has patient fallen in last 6 months? No  LIVING ENVIRONMENT: Lives with: lives alone Lives in: House/apartment Stairs:  one home Has following equipment at home: Grab bars  OCCUPATION: Retired  PLOF: Independent and Leisure: gardening, reading, walking her dog, socializing with friends  PATIENT GOALS: To be stronger to be able to return to gardening and other desired tasks.  NEXT MD VISIT: As Needed  OBJECTIVE:  Note: Objective measures were completed at Evaluation unless otherwise noted.  DIAGNOSTIC FINDINGS:  Lumbar CT on 08/19/2023: IMPRESSION: 1. Redemonstrated subacute appearing compression fractures of the L1 and L2 superior endplates with slight interval progression of height loss at L1, now moderate. Unchanged moderate height loss at L2. 2. Mild spinal canal stenosis at T12-L1, L1-L2, and L3-L4. 3. Moderate spinal canal stenosis at L4-L5. 4. Cholelithiasis.  PATIENT SURVEYS:  Eval:  FOTO 51 (projected 66 by visit 12)  COGNITION: Overall cognitive status: Within functional limits for tasks assessed     SENSATION: Patient denies and numbness or tingling  MUSCLE LENGTH: Hamstrings: Tightness bilaterally  POSTURE: rounded shoulders, forward head, and flexed trunk    LUMBAR ROM:   Eval:  Patient with painful lumbar ROM, limited at least 50%  LOWER  EXTREMITY ROM:     WFL  LOWER EXTREMITY MMT:    Eval:   Right LE strength is grossly WFL Left hip strength is 4/5  LUMBAR SPECIAL TESTS:  Slump test: Negative  FUNCTIONAL TESTS:  Eval: 5 times sit to stand: 15.07 sec 3 minute walk test: 560 ft with slight increase in pain  in right flank area  GAIT: Distance walked: >500 ft Assistive device utilized: None Level of assistance: Complete Independence Comments: Patient with antalgic gait pattern with decreased step length and Trendelenburg gait  TODAY'S TREATMENT  DATE: 12/07/2023 Nustep level 5 x6 min with PT present to discuss status Seated with 3# ankle weights:  LAQ, marching, hip ER.  2x10 each bilat Sit to/from stand holding 5# kettlebell:  x10 with chest press, x10 with overhead press Standing at barre with 3# ankle weights:  hip abduction, hip extension.  2x10 each bilat Farmers carry with 5# kettlebell with marching x10 with weight in each hand Standing shoulder extension with red tband 2x10 with cuing for core engagement Reviewed education for box breathing and provided cuing and demonstration Standing pallof press with red tband x10 bilat Standing L counter stretch x20 sec with education for proper form Standing balance on rocker board x1 minute with board in both positions Standing hamstring stretch at stairs x20 sec bilat   DATE: 12/05/2023 Seated with 3# ankle weights:  LAQ, marching, hip ER.  2x10 each bilat Standing at barre with 3# ankle weights:  marching (no wt on R due to form and pain in back), hip abduction, hip extension.  2x10 each bilat Sit to/from stand  x 10, then with 5# held at chest x 10 Shoulder ext red from top of stairs with TA contraction  2x10 monitor for pain Pallof press red x 10 B PPT x 3, TA contraction with single leg march - some pulling in back Diaphragmatic and box breathing to assist with relaxation for manual therapy TPR to B hip flexors with active hip flexion x 10 B    DATE:  12/01/2023 Nustep level 4 x6 min with PT present to discuss status Seated green pball rollout 10x5 sec hold Seated transversus abdominus with hands pressing ball into thighs 2x10 Seated hip adduction ball squeeze 2x10 Seated with 2# ankle weights:  LAQ, marching, hip ER.  2x10 each bilat Sit to/from stand on foam pad 2x10 Standing at barre with 2# ankle weights:  marching, hip abduction, hip extension.  2x10 each bilat Seated hamstring stretch 2x20 sec bilat        PATIENT EDUCATION:  Education details: Issued HEP and provided handout and education on dry needling Person educated: Patient Education method: Explanation, Demonstration, and Handouts Education comprehension: verbalized understanding and returned demonstration  HOME EXERCISE PROGRAM: Access Code: [email or web address] URL: [email or web address] Date: 11/21/2023 Prepared by: Jarrell Carylon Tamburro  Exercises - Seated Hamstring Stretch  - 1 x daily - 7 x weekly - 2 reps - 20 sec hold - Seated Piriformis Stretch with Trunk Bend  - 1 x daily - 7 x weekly - 2 reps - 20 sec hold - Seated Transversus Abdominis Bracing  - 1 x daily - 7 x weekly - 2 sets - 10 reps - Standing 'L' Stretch at Counter  - 1 x daily - 7 x weekly - 2 reps - 20 sec  hold - Seated Long Arc Quad  - 1 x daily - 7 x weekly - 2 sets - 10 reps - Sit to Stand Without Arm Support  - 1 x daily - 7 x weekly - 5 sets - 5-10 reps - Supine Lower Trunk Rotation  - 1 x daily - 7 x weekly - 10 reps - Supine March with Posterior Pelvic Tilt  - 1 x daily - 7 x weekly - 2 sets - 10 reps - Isometric Dead Bug  - 1 x  daily - 7 x weekly - 5 reps - 10 sec hold  ASSESSMENT:  CLINICAL IMPRESSION: Ms Madry presents to skilled PT reporting that she is feeling better with more regular use of muscle relaxant.  Patient with good participation throughout and able to be more mindful of core engagement during session.  Patient continues with some difficulty with paloff press, but able  to complete.  Provided cuing throughout for improved form to decrease strain on her back.  Education provided on recovery following back surgery and various expectations of healing.  Patient continues to require skilled PT to progress towards goal related activities.  OBJECTIVE IMPAIRMENTS: decreased balance, difficulty walking, decreased strength, increased muscle spasms, impaired flexibility, postural dysfunction, and pain.   ACTIVITY LIMITATIONS: carrying, lifting, bending, standing, and squatting  PARTICIPATION LIMITATIONS: cleaning, shopping, community activity, and yard work  PERSONAL FACTORS: Past/current experiences and 3+ comorbidities: s/p lumbar kyphoplasty, right ankle fusion, osteopenia  are also affecting patient's functional outcome.   REHAB POTENTIAL: Good  CLINICAL DECISION MAKING: Stable/uncomplicated  EVALUATION COMPLEXITY: Low   GOALS: Goals reviewed with patient? Yes  SHORT TERM GOALS: Target date: 12/09/2023  Patient will be independent with initial HEP. Baseline: Goal status: Met on 11/23/23  2.  Patient will report at least a 25% improvement in symptoms since starting PT. Baseline:  Goal status: Met on 12/07/2023   LONG TERM GOALS: Target date: 01/13/2024  Patient will be independent with advanced HEP to allow for self progression after discharge. Baseline:  Goal status: Ongoing  2.  Patient will increase LE and trunk functional strength to [facility] to allow her to lift buckets of dirt for gardening in the Spring. Baseline:  Goal status: INITIAL  3.  Patient will report ability to return to walking her dog daily on previous route without increased pain. Baseline:  Goal status: INITIAL  4.  Patient will increase FOTO to at least 61 to demonstrate improved functional mobility. Baseline: 51 Goal status: INITIAL  5.  Patient to report ability to return to going out to dinner or other outings with her friends without increased pain or muscle  spasms.  Baseline:   Goal status:  INITIAL    PLAN:  PT FREQUENCY: 2x/week  PT DURATION: 8 weeks  PLANNED INTERVENTIONS: 97164- PT Re-evaluation, 97110-Therapeutic exercises, 97530- Therapeutic activity, 97112- Neuromuscular re-education, 97535- Self Care, [id number]- Manual therapy, [phone]- Gait training, [phone]- Canalith repositioning, [id number]- Aquatic Therapy, [id number]- Electrical stimulation (unattended), [id number]- Electrical stimulation (manual), [id number]- Ultrasound, Patient/Family education, Balance training, Stair training, Taping, Dry Needling, Joint mobilization, Joint manipulation, Spinal manipulation, Spinal mobilization, Scar mobilization, Vestibular training, Cryotherapy, and Moist heat.  PLAN FOR NEXT SESSION: Assess and progress HEP as indicated, strengthening, core stability, flexibility, manual/dry needling as indicated   Jarrell Laming, PT, DPT 12/07/23, 12:05 PM  [facility] [address] [place], [place] [address] Phone # [phone] Fax [phone]

## 2023-12-08 DIAGNOSIS — H02834 Dermatochalasis of left upper eyelid: Secondary | ICD-10-CM | POA: Diagnosis not present

## 2023-12-08 DIAGNOSIS — H02831 Dermatochalasis of right upper eyelid: Secondary | ICD-10-CM | POA: Diagnosis not present

## 2023-12-08 DIAGNOSIS — H02423 Myogenic ptosis of bilateral eyelids: Secondary | ICD-10-CM | POA: Diagnosis not present

## 2023-12-09 ENCOUNTER — Encounter: Payer: Self-pay | Admitting: Cardiology

## 2023-12-26 ENCOUNTER — Ambulatory Visit: Payer: Medicare Other | Admitting: Rehabilitative and Restorative Service Providers"

## 2023-12-26 ENCOUNTER — Encounter: Payer: Self-pay | Admitting: Rehabilitative and Restorative Service Providers"

## 2023-12-26 DIAGNOSIS — M6281 Muscle weakness (generalized): Secondary | ICD-10-CM | POA: Diagnosis not present

## 2023-12-26 DIAGNOSIS — M5459 Other low back pain: Secondary | ICD-10-CM | POA: Diagnosis not present

## 2023-12-26 DIAGNOSIS — R252 Cramp and spasm: Secondary | ICD-10-CM | POA: Diagnosis not present

## 2023-12-26 NOTE — Therapy (Signed)
 OUTPATIENT PHYSICAL THERAPY TREATMENT NOTE   Patient Name: Mary Potter MRN: [id number] DOB:[birth year], 78 y.o., female Today's Date: 12/26/2023  END OF SESSION:  PT End of Session - 12/26/23 1102     Visit Number 8    Date for PT Re-Evaluation 01/13/24    Authorization Type Medicare    Progress Note Due on Visit 10    PT Start Time 1100    PT Stop Time 1140    PT Time Calculation (min) 40 min    Activity Tolerance Patient tolerated treatment well    Behavior During Therapy [facility] for tasks assessed/performed               Past Medical History:  Diagnosis Date   Atrial fibrillation (HCC)    Dysrhythmia    A-fib   Endometrial hyperplasia 01/03/2015   GERD (gastroesophageal reflux disease)    History of kidney stones    Hyperlipidemia    Hypertension    Pneumonia    PONV (postoperative nausea and vomiting)    slow to wake up   Thickened endometrium 01/03/2015   Vaginal delivery 1976, 1985   Past Surgical History:  Procedure Laterality Date   ANKLE FRACTURE SURGERY     ATRIAL FIBRILLATION ABLATION N/A 02/08/2022   Procedure: ATRIAL FIBRILLATION ABLATION;  Surgeon: Lanier Prude, MD;  Location: [facility];  Service: Cardiovascular;  Laterality: N/A;   CARDIOVERSION N/A 02/17/2023   Procedure: CARDIOVERSION;  Surgeon: Thomasene Ripple, DO;  Location: [facility];  Service: Cardiovascular;  Laterality: N/A;   CATARACT EXTRACTION Right 2020   DILATION AND CURETTAGE OF UTERUS  2012   ablation    EYE SURGERY     Corneal growth removal on right eye   HYSTEROSCOPY WITH D & C N/A 01/03/2015   Procedure: DILATATION AND CURETTAGE /HYSTEROSCOPY;  Surgeon: Shea Evans, MD;  Location: [facility];  Service: Gynecology;  Laterality: N/A;   LAPAROSCOPY     for endometriosis   ROBOTIC ASSISTED TOTAL HYSTERECTOMY WITH BILATERAL SALPINGO OOPHERECTOMY N/A 03/17/2021   Procedure: XI ROBOTIC ASSISTED TOTAL HYSTERECTOMY WITH BILATERAL SALPINGO OOPHORECTOMY;  Surgeon: Carver Fila, MD;  Location: [facility];  Service: Gynecology;  Laterality: N/A;   SENTINEL NODE BIOPSY N/A 03/17/2021   Procedure: SENTINEL NODE BIOPSY;  Surgeon: Carver Fila, MD;  Location: [facility];  Service: Gynecology;  Laterality: N/A;   TONSILLECTOMY AND ADENOIDECTOMY     Patient Active Problem List   Diagnosis Date Noted   Coronary artery disease involving native coronary artery of native heart without angina pectoris 03/28/2023   Hypercoagulable state due to paroxysmal atrial fibrillation (HCC) 03/02/2023   Persistent atrial fibrillation (HCC) 03/02/2023   Endometrial adenocarcinoma (HCC) 03/20/2021   Complex endometrial hyperplasia with atypia 02/18/2021   PAF (paroxysmal atrial fibrillation) (HCC) 10/15/2020   Elevated coronary artery calcium score 09/14/2020   Essential hypertension 09/14/2020   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 06/29/2018   Greater trochanteric bursitis of left hip 04/18/2018   Acute left lumbar radiculopathy 03/21/2018   Iliotibial band tendinitis of right side 09/30/2016   Trigger finger, right middle finger 09/30/2016   Thickened endometrium 01/03/2015   Endometrial hyperplasia 01/03/2015   Tibialis posterior tendonitis 05/09/2014   H/O ankle fusion 05/09/2014   Left leg pain 04/17/2014   Lipid disorder 11/14/2012   GERD 09/11/2010   CONSTIPATION 09/11/2010   DIARRHEA 09/11/2010    PCP: Alysia Penna, MD  REFERRING PROVIDER: Floreen Comber, NP  REFERRING DIAG: S32.010A (ICD-10-CM) - Wedge compression fracture of first lumbar vertebra (HCC)  Rationale for Evaluation and Treatment: Rehabilitation  THERAPY DIAG:  Other low back pain  Cramp and spasm  Muscle weakness (generalized)  ONSET DATE: s/p kyphoplasty on 09/05/2023  SUBJECTIVE:                                                                                                                                                                                            SUBJECTIVE STATEMENT: Patient states that she missed a couple of weeks due to having eye surgery.  Patient states that she is doing more, but is having increased pain with more activity.  PERTINENT HISTORY:  Right Ankle Fusion, s/p kyphoplasty on 09/05/2023, osteopenia Pt states that she is being worked up at the [facility] to rule out Multiple Myeloma  PAIN:  Are you having pain? Yes: NPRS scale: 1-4/10 Pain location: right flank area of low back Pain description: soreness Aggravating factors: walking, reaching Relieving factors: sitting  PRECAUTIONS: None  RED FLAGS: Compression fracture: Yes: s/p kyphoplasty    WEIGHT BEARING RESTRICTIONS: No  FALLS:  Has patient fallen in last 6 months? No  LIVING ENVIRONMENT: Lives with: lives alone Lives in: House/apartment Stairs:  one home Has following equipment at home: Grab bars  OCCUPATION: Retired  PLOF: Independent and Leisure: gardening, reading, walking her dog, socializing with friends  PATIENT GOALS: To be stronger to be able to return to gardening and other desired tasks.  NEXT MD VISIT: As Needed  OBJECTIVE:  Note: Objective measures were completed at Evaluation unless otherwise noted.  DIAGNOSTIC FINDINGS:  Lumbar CT on 08/19/2023: IMPRESSION: 1. Redemonstrated subacute appearing compression fractures of the L1 and L2 superior endplates with slight interval progression of height loss at L1, now moderate. Unchanged moderate height loss at L2. 2. Mild spinal canal stenosis at T12-L1, L1-L2, and L3-L4. 3. Moderate spinal canal stenosis at L4-L5. 4. Cholelithiasis.  PATIENT SURVEYS:  Eval:  FOTO 51 (projected 60 by visit 12)  COGNITION: Overall cognitive status: Within functional limits for tasks assessed     SENSATION: Patient denies and numbness or tingling  MUSCLE LENGTH: Hamstrings: Tightness bilaterally  POSTURE: rounded shoulders, forward head, and flexed trunk    LUMBAR ROM:   Eval:   Patient with painful lumbar ROM, limited at least 50%  LOWER EXTREMITY ROM:     WFL  LOWER EXTREMITY MMT:    Eval:   Right LE strength is grossly WFL Left hip strength is 4/5  LUMBAR SPECIAL TESTS:  Slump test: Negative  FUNCTIONAL TESTS:  Eval: 5 times sit to stand: 15.07  sec 3 minute walk test: 560 ft with slight increase in pain in right flank area  12/26/2023: 3 minute walk test: 591 ft  GAIT: Distance walked: >500 ft Assistive device utilized: None Level of assistance: Complete Independence Comments: Patient with antalgic gait pattern with decreased step length and Trendelenburg gait  TODAY'S TREATMENT  DATE: 12/26/2023 Nustep level 5 x6 min with PT present to discuss status Seated with 3# ankle weights:  LAQ, marching, hip ER.  2x10 each bilat 3 minute walk test: 591 ft Sit to/from stand holding 5# kettlebell:  x10 with chest press, x10 with overhead press Farmers carry with 5# kettlebell with marching x10 with weight in each hand Standing pallof press with red tband x10 bilat Seated row 10# 2x10 Leg Press (seat at 7) 50# 2x5 Standing L counter stretch 2x20 sec   DATE: 12/07/2023 Nustep level 5 x6 min with PT present to discuss status Seated with 3# ankle weights:  LAQ, marching, hip ER.  2x10 each bilat Sit to/from stand holding 5# kettlebell:  x10 with chest press, x10 with overhead press Standing at barre with 3# ankle weights:  hip abduction, hip extension.  2x10 each bilat Farmers carry with 5# kettlebell with marching x10 with weight in each hand Standing shoulder extension with red tband 2x10 with cuing for core engagement Reviewed education for box breathing and provided cuing and demonstration Standing pallof press with red tband x10 bilat Standing L counter stretch x20 sec with education for proper form Standing balance on rocker board x1 minute with board in both positions Standing hamstring stretch at stairs x20 sec bilat   DATE: 12/05/2023 Seated  with 3# ankle weights:  LAQ, marching, hip ER.  2x10 each bilat Standing at barre with 3# ankle weights:  marching (no wt on R due to form and pain in back), hip abduction, hip extension.  2x10 each bilat Sit to/from stand  x 10, then with 5# held at chest x 10 Shoulder ext red from top of stairs with TA contraction  2x10 monitor for pain Pallof press red x 10 B PPT x 3, TA contraction with single leg march - some pulling in back Diaphragmatic and box breathing to assist with relaxation for manual therapy TPR to B hip flexors with active hip flexion x 10 B     PATIENT EDUCATION:  Education details: Issued HEP and provided handout and education on dry needling Person educated: Patient Education method: Explanation, Demonstration, and Handouts Education comprehension: verbalized understanding and returned demonstration  HOME EXERCISE PROGRAM: Access Code: [email or web address] URL: [email or web address] Date: 11/21/2023 Prepared by: Clydie Braun Nazareth Kirk  Exercises - Seated Hamstring Stretch  - 1 x daily - 7 x weekly - 2 reps - 20 sec hold - Seated Piriformis Stretch with Trunk Bend  - 1 x daily - 7 x weekly - 2 reps - 20 sec hold - Seated Transversus Abdominis Bracing  - 1 x daily - 7 x weekly - 2 sets - 10 reps - Standing 'L' Stretch at Counter  - 1 x daily - 7 x weekly - 2 reps - 20 sec  hold - Seated Long Arc Quad  - 1 x daily - 7 x weekly - 2 sets - 10 reps - Sit to Stand Without Arm Support  - 1 x daily - 7 x weekly - 5 sets - 5-10 reps - Supine Lower Trunk Rotation  - 1 x daily - 7 x weekly - 10 reps - Supine March with Posterior Pelvic Tilt  -  1 x daily - 7 x weekly - 2 sets - 10 reps - Isometric Dead Bug  - 1 x daily - 7 x weekly - 5 reps - 10 sec hold  ASSESSMENT:  CLINICAL IMPRESSION: Ms Laidlaw presents to skilled PT reporting that she was advised to hold on PT after her eye surgery, but she is cleared to resume activity in PT now.  Patient able to initiate weight machines  today at a low weight to progress with strengthening.  Patient states that her second set on the leg press felt better than the first set.  Patient with improved distance on 3 minute walk today compared to evaluation.  Patient continues to progress towards goal related activities.  OBJECTIVE IMPAIRMENTS: decreased balance, difficulty walking, decreased strength, increased muscle spasms, impaired flexibility, postural dysfunction, and pain.   ACTIVITY LIMITATIONS: carrying, lifting, bending, standing, and squatting  PARTICIPATION LIMITATIONS: cleaning, shopping, community activity, and yard work  PERSONAL FACTORS: Past/current experiences and 3+ comorbidities: s/p lumbar kyphoplasty, right ankle fusion, osteopenia  are also affecting patient's functional outcome.   REHAB POTENTIAL: Good  CLINICAL DECISION MAKING: Stable/uncomplicated  EVALUATION COMPLEXITY: Low   GOALS: Goals reviewed with patient? Yes  SHORT TERM GOALS: Target date: 12/09/2023  Patient will be independent with initial HEP. Baseline: Goal status: Met on 11/23/23  2.  Patient will report at least a 25% improvement in symptoms since starting PT. Baseline:  Goal status: Met on 12/07/2023   LONG TERM GOALS: Target date: 01/13/2024  Patient will be independent with advanced HEP to allow for self progression after discharge. Baseline:  Goal status: Ongoing  2.  Patient will increase LE and trunk functional strength to [facility] to allow her to lift buckets of dirt for gardening in the Spring. Baseline:  Goal status: INITIAL  3.  Patient will report ability to return to walking her dog daily on previous route without increased pain. Baseline:  Goal status: INITIAL  4.  Patient will increase FOTO to at least 61 to demonstrate improved functional mobility. Baseline: 51 Goal status: INITIAL  5.  Patient to report ability to return to going out to dinner or other outings with her friends without increased pain or muscle  spasms.  Baseline:   Goal status:  INITIAL    PLAN:  PT FREQUENCY: 2x/week  PT DURATION: 8 weeks  PLANNED INTERVENTIONS: 97164- PT Re-evaluation, 97110-Therapeutic exercises, 97530- Therapeutic activity, 97112- Neuromuscular re-education, 97535- Self Care, 09811- Manual therapy, [phone]- Gait training, [phone]- Canalith repositioning, [id number]- Aquatic Therapy, 97014- Electrical stimulation (unattended), [id number]- Electrical stimulation (manual), [id number]- Ultrasound, Patient/Family education, Balance training, Stair training, Taping, Dry Needling, Joint mobilization, Joint manipulation, Spinal manipulation, Spinal mobilization, Scar mobilization, Vestibular training, Cryotherapy, and Moist heat.  PLAN FOR NEXT SESSION: Assess and progress HEP as indicated, strengthening, core stability, flexibility, manual/dry needling as indicated   Reather Laurence, PT, DPT 12/26/23, 11:45 AM  [facility] [address] [place], [place] [address] Phone # [phone] Fax [phone]

## 2023-12-28 ENCOUNTER — Encounter: Payer: Self-pay | Admitting: Rehabilitative and Restorative Service Providers"

## 2023-12-28 ENCOUNTER — Ambulatory Visit: Payer: Medicare Other | Admitting: Rehabilitative and Restorative Service Providers"

## 2023-12-28 DIAGNOSIS — R252 Cramp and spasm: Secondary | ICD-10-CM | POA: Diagnosis not present

## 2023-12-28 DIAGNOSIS — M5459 Other low back pain: Secondary | ICD-10-CM

## 2023-12-28 DIAGNOSIS — M6281 Muscle weakness (generalized): Secondary | ICD-10-CM | POA: Diagnosis not present

## 2023-12-28 NOTE — Therapy (Signed)
 OUTPATIENT PHYSICAL THERAPY TREATMENT NOTE   Patient Name: Mary Potter MRN: [id number] DOB:28-Jan-1946, 78 y.o., female Today's Date: 12/28/2023  END OF SESSION:  PT End of Session - 12/28/23 1148     Visit Number 9    Date for PT Re-Evaluation 01/13/24    Authorization Type Medicare    Progress Note Due on Visit 10    PT Start Time 1144    PT Stop Time 1225    PT Time Calculation (min) 41 min    Activity Tolerance Patient tolerated treatment well    Behavior During Therapy [facility] for tasks assessed/performed               Past Medical History:  Diagnosis Date   Atrial fibrillation (HCC)    Dysrhythmia    A-fib   Endometrial hyperplasia 01/03/2015   GERD (gastroesophageal reflux disease)    History of kidney stones    Hyperlipidemia    Hypertension    Pneumonia    PONV (postoperative nausea and vomiting)    slow to wake up   Thickened endometrium 01/03/2015   Vaginal delivery 1976, 1985   Past Surgical History:  Procedure Laterality Date   ANKLE FRACTURE SURGERY     ATRIAL FIBRILLATION ABLATION N/A 02/08/2022   Procedure: ATRIAL FIBRILLATION ABLATION;  Surgeon: Lanier Prude, MD;  Location: [facility];  Service: Cardiovascular;  Laterality: N/A;   CARDIOVERSION N/A 02/17/2023   Procedure: CARDIOVERSION;  Surgeon: Thomasene Ripple, DO;  Location: [facility];  Service: Cardiovascular;  Laterality: N/A;   CATARACT EXTRACTION Right 2020   DILATION AND CURETTAGE OF UTERUS  2012   ablation    EYE SURGERY     Corneal growth removal on right eye   HYSTEROSCOPY WITH D & C N/A 01/03/2015   Procedure: DILATATION AND CURETTAGE /HYSTEROSCOPY;  Surgeon: Shea Evans, MD;  Location: [facility];  Service: Gynecology;  Laterality: N/A;   LAPAROSCOPY     for endometriosis   ROBOTIC ASSISTED TOTAL HYSTERECTOMY WITH BILATERAL SALPINGO OOPHERECTOMY N/A 03/17/2021   Procedure: XI ROBOTIC ASSISTED TOTAL HYSTERECTOMY WITH BILATERAL SALPINGO OOPHORECTOMY;  Surgeon: Carver Fila, MD;  Location: [facility];  Service: Gynecology;  Laterality: N/A;   SENTINEL NODE BIOPSY N/A 03/17/2021   Procedure: SENTINEL NODE BIOPSY;  Surgeon: Carver Fila, MD;  Location: [facility];  Service: Gynecology;  Laterality: N/A;   TONSILLECTOMY AND ADENOIDECTOMY     Patient Active Problem List   Diagnosis Date Noted   Coronary artery disease involving native coronary artery of native heart without angina pectoris 03/28/2023   Hypercoagulable state due to paroxysmal atrial fibrillation (HCC) 03/02/2023   Persistent atrial fibrillation (HCC) 03/02/2023   Endometrial adenocarcinoma (HCC) 03/20/2021   Complex endometrial hyperplasia with atypia 02/18/2021   PAF (paroxysmal atrial fibrillation) (HCC) 10/15/2020   Elevated coronary artery calcium score 09/14/2020   Essential hypertension 09/14/2020   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 06/29/2018   Greater trochanteric bursitis of left hip 04/18/2018   Acute left lumbar radiculopathy 03/21/2018   Iliotibial band tendinitis of right side 09/30/2016   Trigger finger, right middle finger 09/30/2016   Thickened endometrium 01/03/2015   Endometrial hyperplasia 01/03/2015   Tibialis posterior tendonitis 05/09/2014   H/O ankle fusion 05/09/2014   Left leg pain 04/17/2014   Lipid disorder 11/14/2012   GERD 09/11/2010   CONSTIPATION 09/11/2010   DIARRHEA 09/11/2010    PCP: Alysia Penna, MD  REFERRING PROVIDER: Floreen Comber, NP  REFERRING DIAG: S32.010A (ICD-10-CM) - Wedge compression fracture of first lumbar vertebra (HCC)  Rationale for Evaluation and Treatment: Rehabilitation  THERAPY DIAG:  Other low back pain  Cramp and spasm  Muscle weakness (generalized)  ONSET DATE: s/p kyphoplasty on 09/05/2023  SUBJECTIVE:                                                                                                                                                                                            SUBJECTIVE STATEMENT: Patient reports that she is overall feeling better.  PERTINENT HISTORY:  Right Ankle Fusion, s/p kyphoplasty on 09/05/2023, osteopenia Pt states that she is being worked up at the [facility] to rule out Multiple Myeloma  PAIN:  Are you having pain? Yes: NPRS scale: 1/10 Pain location: right flank area of low back Pain description: soreness Aggravating factors: walking, reaching Relieving factors: sitting  PRECAUTIONS: None  RED FLAGS: Compression fracture: Yes: s/p kyphoplasty    WEIGHT BEARING RESTRICTIONS: No  FALLS:  Has patient fallen in last 6 months? No  LIVING ENVIRONMENT: Lives with: lives alone Lives in: House/apartment Stairs:  one home Has following equipment at home: Grab bars  OCCUPATION: Retired  PLOF: Independent and Leisure: gardening, reading, walking her dog, socializing with friends  PATIENT GOALS: To be stronger to be able to return to gardening and other desired tasks.  NEXT MD VISIT: As Needed  OBJECTIVE:  Note: Objective measures were completed at Evaluation unless otherwise noted.  DIAGNOSTIC FINDINGS:  Lumbar CT on 08/19/2023: IMPRESSION: 1. Redemonstrated subacute appearing compression fractures of the L1 and L2 superior endplates with slight interval progression of height loss at L1, now moderate. Unchanged moderate height loss at L2. 2. Mild spinal canal stenosis at T12-L1, L1-L2, and L3-L4. 3. Moderate spinal canal stenosis at L4-L5. 4. Cholelithiasis.  PATIENT SURVEYS:  Eval:  FOTO 51 (projected 2 by visit 12) 12/28/2023:  FOTO 61  COGNITION: Overall cognitive status: Within functional limits for tasks assessed     SENSATION: Patient denies and numbness or tingling  MUSCLE LENGTH: Hamstrings: Tightness bilaterally  POSTURE: rounded shoulders, forward head, and flexed trunk    LUMBAR ROM:   Eval:  Patient with painful lumbar ROM, limited at least 50%  LOWER EXTREMITY ROM:     WFL  LOWER  EXTREMITY MMT:    Eval:   Right LE strength is grossly WFL Left hip strength is 4/5  LUMBAR SPECIAL TESTS:  Slump test: Negative  FUNCTIONAL TESTS:  Eval: 5 times sit to stand: 15.07 sec 3 minute walk test: 560 ft with slight increase in pain in right flank area  12/26/2023:  3 minute walk test: 591 ft  GAIT: Distance walked: >500 ft Assistive device utilized: None Level of assistance: Complete Independence Comments: Patient with antalgic gait pattern with decreased step length and Trendelenburg gait  TODAY'S TREATMENT  DATE: 12/28/2023 Nustep level 5 x6 min with PT present to discuss status Seated with 4# ankle weights:  LAQ, marching, hip ER.  2x10 each bilat Sit to/from stand holding 5# kettlebell:  x10 with chest press, x10 with overhead press Seated modified dead lift with 5# kettle bell 2x10 Farmers carry with 5# kettlebell with marching x10 with weight in each hand Leg Press (seat at 7) 50# 3x5 Seated row 10# 2x10 Retrogait with 10# cable pulley x10 Standing balance on rocker board x1 min   DATE: 12/26/2023 Nustep level 5 x6 min with PT present to discuss status Seated with 3# ankle weights:  LAQ, marching, hip ER.  2x10 each bilat 3 minute walk test: 591 ft Sit to/from stand holding 5# kettlebell:  x10 with chest press, x10 with overhead press Farmers carry with 5# kettlebell with marching x10 with weight in each hand Standing pallof press with red tband x10 bilat Seated row 10# 2x10 Leg Press (seat at 7) 50# 2x5 Standing L counter stretch 2x20 sec   DATE: 12/07/2023 Nustep level 5 x6 min with PT present to discuss status Seated with 3# ankle weights:  LAQ, marching, hip ER.  2x10 each bilat Sit to/from stand holding 5# kettlebell:  x10 with chest press, x10 with overhead press Standing at barre with 3# ankle weights:  hip abduction, hip extension.  2x10 each bilat Farmers carry with 5# kettlebell with marching x10 with weight in each hand Standing shoulder  extension with red tband 2x10 with cuing for core engagement Reviewed education for box breathing and provided cuing and demonstration Standing pallof press with red tband x10 bilat Standing L counter stretch x20 sec with education for proper form Standing balance on rocker board x1 minute with board in both positions Standing hamstring stretch at stairs x20 sec bilat     PATIENT EDUCATION:  Education details: Issued HEP and provided handout and education on dry needling Person educated: Patient Education method: Programmer, multimedia, Demonstration, and Handouts Education comprehension: verbalized understanding and returned demonstration  HOME EXERCISE PROGRAM: Access Code: [email or web address] URL: [email or web address] Date: 11/21/2023 Prepared by: Clydie Braun Merle Whitehorn  Exercises - Seated Hamstring Stretch  - 1 x daily - 7 x weekly - 2 reps - 20 sec hold - Seated Piriformis Stretch with Trunk Bend  - 1 x daily - 7 x weekly - 2 reps - 20 sec hold - Seated Transversus Abdominis Bracing  - 1 x daily - 7 x weekly - 2 sets - 10 reps - Standing 'L' Stretch at Counter  - 1 x daily - 7 x weekly - 2 reps - 20 sec  hold - Seated Long Arc Quad  - 1 x daily - 7 x weekly - 2 sets - 10 reps - Sit to Stand Without Arm Support  - 1 x daily - 7 x weekly - 5 sets - 5-10 reps - Supine Lower Trunk Rotation  - 1 x daily - 7 x weekly - 10 reps - Supine March with Posterior Pelvic Tilt  - 1 x daily - 7 x weekly - 2 sets - 10 reps - Isometric Dead Bug  - 1 x daily - 7 x weekly - 5 reps - 10 sec hold  ASSESSMENT:  CLINICAL IMPRESSION: Ms Teston presents to skilled PT reporting  less pain than last session.  Patient has improved her FOTO score and has met that goal.  Patient able to progress with improved strengthening during session.  Patient continues with difficulty with balance and has difficulty with Farmer's Carry with high marching.  Patient continues to require skilled PT to progress towards goal related  activities.  OBJECTIVE IMPAIRMENTS: decreased balance, difficulty walking, decreased strength, increased muscle spasms, impaired flexibility, postural dysfunction, and pain.   ACTIVITY LIMITATIONS: carrying, lifting, bending, standing, and squatting  PARTICIPATION LIMITATIONS: cleaning, shopping, community activity, and yard work  PERSONAL FACTORS: Past/current experiences and 3+ comorbidities: s/p lumbar kyphoplasty, right ankle fusion, osteopenia  are also affecting patient's functional outcome.   REHAB POTENTIAL: Good  CLINICAL DECISION MAKING: Stable/uncomplicated  EVALUATION COMPLEXITY: Low   GOALS: Goals reviewed with patient? Yes  SHORT TERM GOALS: Target date: 12/09/2023  Patient will be independent with initial HEP. Baseline: Goal status: Met on 11/23/23  2.  Patient will report at least a 25% improvement in symptoms since starting PT. Baseline:  Goal status: Met on 12/07/2023   LONG TERM GOALS: Target date: 01/13/2024  Patient will be independent with advanced HEP to allow for self progression after discharge. Baseline:  Goal status: Ongoing  2.  Patient will increase LE and trunk functional strength to [facility] to allow her to lift buckets of dirt for gardening in the Spring. Baseline:  Goal status: Ongoing  3.  Patient will report ability to return to walking her dog daily on previous route without increased pain. Baseline:  Goal status: Ongoing  4.  Patient will increase FOTO to at least 61 to demonstrate improved functional mobility. Baseline: 51 Goal status: Met on 12/28/23  5.  Patient to report ability to return to going out to dinner or other outings with her friends without increased pain or muscle spasms.  Baseline:   Goal status:  Ongoing    PLAN:  PT FREQUENCY: 2x/week  PT DURATION: 8 weeks  PLANNED INTERVENTIONS: 97164- PT Re-evaluation, 97110-Therapeutic exercises, 97530- Therapeutic activity, 97112- Neuromuscular re-education, 97535- Self  Care, 16109- Manual therapy, [phone]- Gait training, [phone]- Canalith repositioning, [id number]- Aquatic Therapy, 97014- Electrical stimulation (unattended), [id number]- Electrical stimulation (manual), [id number]- Ultrasound, Patient/Family education, Balance training, Stair training, Taping, Dry Needling, Joint mobilization, Joint manipulation, Spinal manipulation, Spinal mobilization, Scar mobilization, Vestibular training, Cryotherapy, and Moist heat.  PLAN FOR NEXT SESSION: Assess and progress HEP as indicated, strengthening, core stability, flexibility, manual/dry needling as indicated   Reather Laurence, PT, DPT 12/28/23, 12:31 PM  [facility] [address], [address] [place], [place] [address] Phone # [phone] Fax [phone]

## 2024-01-02 ENCOUNTER — Ambulatory Visit: Payer: Medicare Other | Attending: Student | Admitting: Rehabilitative and Restorative Service Providers"

## 2024-01-02 ENCOUNTER — Encounter: Payer: Self-pay | Admitting: Rehabilitative and Restorative Service Providers"

## 2024-01-02 DIAGNOSIS — M6281 Muscle weakness (generalized): Secondary | ICD-10-CM | POA: Diagnosis not present

## 2024-01-02 DIAGNOSIS — R252 Cramp and spasm: Secondary | ICD-10-CM | POA: Diagnosis not present

## 2024-01-02 DIAGNOSIS — M5459 Other low back pain: Secondary | ICD-10-CM | POA: Diagnosis not present

## 2024-01-02 NOTE — Therapy (Signed)
 OUTPATIENT PHYSICAL THERAPY TREATMENT NOTE   Patient Name: Mary Potter MRN: [id number] DOB:16-Oct-1946, 78 y.o., female Today's Date: 01/02/2024  Progress Note Reporting Period 11/18/2023 to 01/02/2024  See note below for Objective Data and Assessment of Progress/Goals.     END OF SESSION:  PT End of Session - 01/02/24 1231     Visit Number 10    Date for PT Re-Evaluation 01/13/24    Authorization Type Medicare    Progress Note Due on Visit 20    PT Start Time 1229    PT Stop Time 1310    PT Time Calculation (min) 41 min    Activity Tolerance Patient tolerated treatment well    Behavior During Therapy [facility] for tasks assessed/performed               Past Medical History:  Diagnosis Date   Atrial fibrillation (HCC)    Dysrhythmia    A-fib   Endometrial hyperplasia 01/03/2015   GERD (gastroesophageal reflux disease)    History of kidney stones    Hyperlipidemia    Hypertension    Pneumonia    PONV (postoperative nausea and vomiting)    slow to wake up   Thickened endometrium 01/03/2015   Vaginal delivery 1976, 1985   Past Surgical History:  Procedure Laterality Date   ANKLE FRACTURE SURGERY     ATRIAL FIBRILLATION ABLATION N/A 02/08/2022   Procedure: ATRIAL FIBRILLATION ABLATION;  Surgeon: Lanier Prude, MD;  Location: [facility];  Service: Cardiovascular;  Laterality: N/A;   CARDIOVERSION N/A 02/17/2023   Procedure: CARDIOVERSION;  Surgeon: Thomasene Ripple, DO;  Location: [facility];  Service: Cardiovascular;  Laterality: N/A;   CATARACT EXTRACTION Right 2020   DILATION AND CURETTAGE OF UTERUS  2012   ablation    EYE SURGERY     Corneal growth removal on right eye   HYSTEROSCOPY WITH D & C N/A 01/03/2015   Procedure: DILATATION AND CURETTAGE /HYSTEROSCOPY;  Surgeon: Shea Evans, MD;  Location: [facility];  Service: Gynecology;  Laterality: N/A;   LAPAROSCOPY     for endometriosis   ROBOTIC ASSISTED TOTAL HYSTERECTOMY WITH BILATERAL SALPINGO  OOPHERECTOMY N/A 03/17/2021   Procedure: XI ROBOTIC ASSISTED TOTAL HYSTERECTOMY WITH BILATERAL SALPINGO OOPHORECTOMY;  Surgeon: Carver Fila, MD;  Location: [facility];  Service: Gynecology;  Laterality: N/A;   SENTINEL NODE BIOPSY N/A 03/17/2021   Procedure: SENTINEL NODE BIOPSY;  Surgeon: Carver Fila, MD;  Location: [facility];  Service: Gynecology;  Laterality: N/A;   TONSILLECTOMY AND ADENOIDECTOMY     Patient Active Problem List   Diagnosis Date Noted   Coronary artery disease involving native coronary artery of native heart without angina pectoris 03/28/2023   Hypercoagulable state due to paroxysmal atrial fibrillation (HCC) 03/02/2023   Persistent atrial fibrillation (HCC) 03/02/2023   Endometrial adenocarcinoma (HCC) 03/20/2021   Complex endometrial hyperplasia with atypia 02/18/2021   PAF (paroxysmal atrial fibrillation) (HCC) 10/15/2020   Elevated coronary artery calcium score 09/14/2020   Essential hypertension 09/14/2020   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 06/29/2018   Greater trochanteric bursitis of left hip 04/18/2018   Acute left lumbar radiculopathy 03/21/2018   Iliotibial band tendinitis of right side 09/30/2016   Trigger finger, right middle finger 09/30/2016   Thickened endometrium 01/03/2015   Endometrial hyperplasia 01/03/2015   Tibialis posterior tendonitis 05/09/2014   H/O ankle fusion 05/09/2014   Left leg pain 04/17/2014   Lipid disorder 11/14/2012   GERD 09/11/2010  CONSTIPATION 09/11/2010   DIARRHEA 09/11/2010    PCP: Alysia Penna, MD  REFERRING PROVIDER: Floreen Comber, NP  REFERRING DIAG: S32.010A (ICD-10-CM) - Wedge compression fracture of first lumbar vertebra (HCC)  Rationale for Evaluation and Treatment: Rehabilitation  THERAPY DIAG:  Other low back pain  Cramp and spasm  Muscle weakness (generalized)  ONSET DATE: s/p kyphoplasty on 09/05/2023  SUBJECTIVE:                                                                                                                                                                                            SUBJECTIVE STATEMENT: Patient states that she had some soreness after last visit, but reports that she also tried walking her dog for the first time as well and he pulled on the leash some.  PERTINENT HISTORY:  Right Ankle Fusion, s/p kyphoplasty on 09/05/2023, osteopenia Pt states that she is being worked up at the [facility] to rule out Multiple Myeloma  PAIN:  Are you having pain? Yes: NPRS scale: 4/10 Pain location: right flank area of low back Pain description: soreness Aggravating factors: walking, reaching Relieving factors: sitting  PRECAUTIONS: None  RED FLAGS: Compression fracture: Yes: s/p kyphoplasty    WEIGHT BEARING RESTRICTIONS: No  FALLS:  Has patient fallen in last 6 months? No  LIVING ENVIRONMENT: Lives with: lives alone Lives in: House/apartment Stairs:  one home Has following equipment at home: Grab bars  OCCUPATION: Retired  PLOF: Independent and Leisure: gardening, reading, walking her dog, socializing with friends  PATIENT GOALS: To be stronger to be able to return to gardening and other desired tasks.  NEXT MD VISIT: As Needed  OBJECTIVE:  Note: Objective measures were completed at Evaluation unless otherwise noted.  DIAGNOSTIC FINDINGS:  Lumbar CT on 08/19/2023: IMPRESSION: 1. Redemonstrated subacute appearing compression fractures of the L1 and L2 superior endplates with slight interval progression of height loss at L1, now moderate. Unchanged moderate height loss at L2. 2. Mild spinal canal stenosis at T12-L1, L1-L2, and L3-L4. 3. Moderate spinal canal stenosis at L4-L5. 4. Cholelithiasis.  PATIENT SURVEYS:  Eval:  FOTO 51 (projected 59 by visit 12) 12/28/2023:  FOTO 61  COGNITION: Overall cognitive status: Within functional limits for tasks assessed     SENSATION: Patient denies and numbness  or tingling  MUSCLE LENGTH: Hamstrings: Tightness bilaterally  POSTURE: rounded shoulders, forward head, and flexed trunk    LUMBAR ROM:   Eval:  Patient with painful lumbar ROM, limited at least 50%  LOWER EXTREMITY ROM:     WFL  LOWER EXTREMITY MMT:    Eval:   Right LE strength  is grossly WFL Left hip strength is 4/5  LUMBAR SPECIAL TESTS:  Slump test: Negative  FUNCTIONAL TESTS:  Eval: 5 times sit to stand: 15.07 sec 3 minute walk test: 560 ft with slight increase in pain in right flank area  12/26/2023: 3 minute walk test: 591 ft  01/02/2024: 5 times sit to stand:  10.16 sec 3 minute walk test:  694 ft  GAIT: Distance walked: >500 ft Assistive device utilized: None Level of assistance: Complete Independence Comments: Patient with antalgic gait pattern with decreased step length and Trendelenburg gait  TODAY'S TREATMENT  DATE: 01/02/2024 Nustep level 5 x5 min with PT present to discuss status 3 minute walk test:  694 ft Seated hamstring stretch 2x20 sec bilat Seated piriformis stretch x20 sec bilat Seated green pball rollout 8x5 sec hold Seated modified dead lift with 5# kettle bell 2x10 Farmers carry with 5# kettlebell with marching x10 with weight in each hand Leg Press (seat at 7) 50# 2x10 Standing row with green tband 2x10 Standing shoulder extension with green tband 2x10 Standing pallof press with green tband x10 bilat Retrogait with 10# cable pulley x10 Standing balance on rocker board x1 min   DATE: 12/28/2023 Nustep level 5 x6 min with PT present to discuss status Seated with 4# ankle weights:  LAQ, marching, hip ER.  2x10 each bilat Sit to/from stand holding 5# kettlebell:  x10 with chest press, x10 with overhead press Seated modified dead lift with 5# kettle bell 2x10 Farmers carry with 5# kettlebell with marching x10 with weight in each hand Leg Press (seat at 7) 50# 3x5 Seated row 10# 2x10 Retrogait with 10# cable pulley x10 Standing  balance on rocker board x1 min   DATE: 12/26/2023 Nustep level 5 x6 min with PT present to discuss status Seated with 3# ankle weights:  LAQ, marching, hip ER.  2x10 each bilat 3 minute walk test: 591 ft Sit to/from stand holding 5# kettlebell:  x10 with chest press, x10 with overhead press Farmers carry with 5# kettlebell with marching x10 with weight in each hand Standing pallof press with red tband x10 bilat Seated row 10# 2x10 Leg Press (seat at 7) 50# 2x5 Standing L counter stretch 2x20 sec    PATIENT EDUCATION:  Education details: Issued HEP and provided handout and education on dry needling Person educated: Patient Education method: Programmer, multimedia, Demonstration, and Handouts Education comprehension: verbalized understanding and returned demonstration  HOME EXERCISE PROGRAM: Access Code: [email or web address] URL: [email or web address] Date: 11/21/2023 Prepared by: Clydie Braun Tianne Plott  Exercises - Seated Hamstring Stretch  - 1 x daily - 7 x weekly - 2 reps - 20 sec hold - Seated Piriformis Stretch with Trunk Bend  - 1 x daily - 7 x weekly - 2 reps - 20 sec hold - Seated Transversus Abdominis Bracing  - 1 x daily - 7 x weekly - 2 sets - 10 reps - Standing 'L' Stretch at Counter  - 1 x daily - 7 x weekly - 2 reps - 20 sec  hold - Seated Long Arc Quad  - 1 x daily - 7 x weekly - 2 sets - 10 reps - Sit to Stand Without Arm Support  - 1 x daily - 7 x weekly - 5 sets - 5-10 reps - Supine Lower Trunk Rotation  - 1 x daily - 7 x weekly - 10 reps - Supine March with Posterior Pelvic Tilt  - 1 x daily - 7 x weekly - 2 sets - 10 reps -  Isometric Dead Bug  - 1 x daily - 7 x weekly - 5 reps - 10 sec hold  ASSESSMENT:  CLINICAL IMPRESSION: Ms Grandpre presents to skilled PT reporting some increased pain after last session, but also reports that she had walked her dog for the first time and he was pulling on the leash some.  Patient session was titered appropriately given increased pain and  provided cuing throughout to assess for increased pain and ensure proper technique.  Patient has progressed with 3 minute walk test and decreased time on 5 times sit to stand.  Patient continues to require skilled PT to progress towards goal related activities.  OBJECTIVE IMPAIRMENTS: decreased balance, difficulty walking, decreased strength, increased muscle spasms, impaired flexibility, postural dysfunction, and pain.   ACTIVITY LIMITATIONS: carrying, lifting, bending, standing, and squatting  PARTICIPATION LIMITATIONS: cleaning, shopping, community activity, and yard work  PERSONAL FACTORS: Past/current experiences and 3+ comorbidities: s/p lumbar kyphoplasty, right ankle fusion, osteopenia  are also affecting patient's functional outcome.   REHAB POTENTIAL: Good  CLINICAL DECISION MAKING: Stable/uncomplicated  EVALUATION COMPLEXITY: Low   GOALS: Goals reviewed with patient? Yes  SHORT TERM GOALS: Target date: 12/09/2023  Patient will be independent with initial HEP. Baseline: Goal status: Met on 11/23/23  2.  Patient will report at least a 25% improvement in symptoms since starting PT. Baseline:  Goal status: Met on 12/07/2023   LONG TERM GOALS: Target date: 01/13/2024  Patient will be independent with advanced HEP to allow for self progression after discharge. Baseline:  Goal status: Ongoing  2.  Patient will increase LE and trunk functional strength to [facility] to allow her to lift buckets of dirt for gardening in the Spring. Baseline:  Goal status: Ongoing  3.  Patient will report ability to return to walking her dog daily on previous route without increased pain. Baseline:  Goal status: Ongoing  4.  Patient will increase FOTO to at least 61 to demonstrate improved functional mobility. Baseline: 51 Goal status: Met on 12/28/23  5.  Patient to report ability to return to going out to dinner or other outings with her friends without increased pain or muscle  spasms.  Baseline:   Goal status:  Ongoing    PLAN:  PT FREQUENCY: 2x/week  PT DURATION: 8 weeks  PLANNED INTERVENTIONS: 97164- PT Re-evaluation, 97110-Therapeutic exercises, 97530- Therapeutic activity, 97112- Neuromuscular re-education, 97535- Self Care, 16109- Manual therapy, [phone]- Gait training, [phone]- Canalith repositioning, [id number]- Aquatic Therapy, 97014- Electrical stimulation (unattended), [id number]- Electrical stimulation (manual), [id number]- Ultrasound, Patient/Family education, Balance training, Stair training, Taping, Dry Needling, Joint mobilization, Joint manipulation, Spinal manipulation, Spinal mobilization, Scar mobilization, Vestibular training, Cryotherapy, and Moist heat.  PLAN FOR NEXT SESSION: Assess and progress HEP as indicated, strengthening, core stability, flexibility, manual/dry needling as indicated   Reather Laurence, PT, DPT 01/02/24, 1:20 PM  [facility] [address] [place], [place] [address] Phone # [phone] Fax [phone]

## 2024-01-04 ENCOUNTER — Encounter: Payer: Self-pay | Admitting: Rehabilitative and Restorative Service Providers"

## 2024-01-04 ENCOUNTER — Ambulatory Visit: Payer: Medicare Other | Admitting: Rehabilitative and Restorative Service Providers"

## 2024-01-04 DIAGNOSIS — M5459 Other low back pain: Secondary | ICD-10-CM | POA: Diagnosis not present

## 2024-01-04 DIAGNOSIS — R252 Cramp and spasm: Secondary | ICD-10-CM | POA: Diagnosis not present

## 2024-01-04 DIAGNOSIS — M6281 Muscle weakness (generalized): Secondary | ICD-10-CM | POA: Diagnosis not present

## 2024-01-04 NOTE — Therapy (Signed)
 OUTPATIENT PHYSICAL THERAPY TREATMENT NOTE   Patient Name: Mary Potter MRN: [id number] DOB:10-22-1946, 78 y.o., female Today's Date: 01/04/2024   END OF SESSION:  PT End of Session - 01/04/24 1232     Visit Number 11    Date for PT Re-Evaluation 01/13/24    Authorization Type Medicare    Progress Note Due on Visit 20    PT Start Time 1230    PT Stop Time 1310    PT Time Calculation (min) 40 min    Activity Tolerance Patient tolerated treatment well    Behavior During Therapy [facility] for tasks assessed/performed               Past Medical History:  Diagnosis Date   Atrial fibrillation (HCC)    Dysrhythmia    A-fib   Endometrial hyperplasia 01/03/2015   GERD (gastroesophageal reflux disease)    History of kidney stones    Hyperlipidemia    Hypertension    Pneumonia    PONV (postoperative nausea and vomiting)    slow to wake up   Thickened endometrium 01/03/2015   Vaginal delivery 1976, 1985   Past Surgical History:  Procedure Laterality Date   ANKLE FRACTURE SURGERY     ATRIAL FIBRILLATION ABLATION N/A 02/08/2022   Procedure: ATRIAL FIBRILLATION ABLATION;  Surgeon: Lanier Prude, MD;  Location: [facility];  Service: Cardiovascular;  Laterality: N/A;   CARDIOVERSION N/A 02/17/2023   Procedure: CARDIOVERSION;  Surgeon: Thomasene Ripple, DO;  Location: [facility];  Service: Cardiovascular;  Laterality: N/A;   CATARACT EXTRACTION Right 2020   DILATION AND CURETTAGE OF UTERUS  2012   ablation    EYE SURGERY     Corneal growth removal on right eye   HYSTEROSCOPY WITH D & C N/A 01/03/2015   Procedure: DILATATION AND CURETTAGE /HYSTEROSCOPY;  Surgeon: Shea Evans, MD;  Location: [facility];  Service: Gynecology;  Laterality: N/A;   LAPAROSCOPY     for endometriosis   ROBOTIC ASSISTED TOTAL HYSTERECTOMY WITH BILATERAL SALPINGO OOPHERECTOMY N/A 03/17/2021   Procedure: XI ROBOTIC ASSISTED TOTAL HYSTERECTOMY WITH BILATERAL SALPINGO OOPHORECTOMY;  Surgeon:  Carver Fila, MD;  Location: [facility];  Service: Gynecology;  Laterality: N/A;   SENTINEL NODE BIOPSY N/A 03/17/2021   Procedure: SENTINEL NODE BIOPSY;  Surgeon: Carver Fila, MD;  Location: [facility];  Service: Gynecology;  Laterality: N/A;   TONSILLECTOMY AND ADENOIDECTOMY     Patient Active Problem List   Diagnosis Date Noted   Coronary artery disease involving native coronary artery of native heart without angina pectoris 03/28/2023   Hypercoagulable state due to paroxysmal atrial fibrillation (HCC) 03/02/2023   Persistent atrial fibrillation (HCC) 03/02/2023   Endometrial adenocarcinoma (HCC) 03/20/2021   Complex endometrial hyperplasia with atypia 02/18/2021   PAF (paroxysmal atrial fibrillation) (HCC) 10/15/2020   Elevated coronary artery calcium score 09/14/2020   Essential hypertension 09/14/2020   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 06/29/2018   Greater trochanteric bursitis of left hip 04/18/2018   Acute left lumbar radiculopathy 03/21/2018   Iliotibial band tendinitis of right side 09/30/2016   Trigger finger, right middle finger 09/30/2016   Thickened endometrium 01/03/2015   Endometrial hyperplasia 01/03/2015   Tibialis posterior tendonitis 05/09/2014   H/O ankle fusion 05/09/2014   Left leg pain 04/17/2014   Lipid disorder 11/14/2012   GERD 09/11/2010   CONSTIPATION 09/11/2010   DIARRHEA 09/11/2010    PCP: Alysia Penna, MD  REFERRING PROVIDER: Floreen Comber,  NP  REFERRING DIAG: S32.010A (ICD-10-CM) - Wedge compression fracture of first lumbar vertebra (HCC)  Rationale for Evaluation and Treatment: Rehabilitation  THERAPY DIAG:  Other low back pain  Cramp and spasm  Muscle weakness (generalized)  ONSET DATE: s/p kyphoplasty on 09/05/2023  SUBJECTIVE:                                                                                                                                                                                            SUBJECTIVE STATEMENT: Patient states that she is feeling better than last session.  States that she was able to walk her dog and he did not pull as much this time.  PERTINENT HISTORY:  Right Ankle Fusion, s/p kyphoplasty on 09/05/2023, osteopenia Pt states that she is being worked up at the [facility] to rule out Multiple Myeloma  PAIN:  Are you having pain? Yes: NPRS scale: 1/10 Pain location: right flank area of low back Pain description: soreness Aggravating factors: walking, reaching Relieving factors: sitting  PRECAUTIONS: None  RED FLAGS: Compression fracture: Yes: s/p kyphoplasty    WEIGHT BEARING RESTRICTIONS: No  FALLS:  Has patient fallen in last 6 months? No  LIVING ENVIRONMENT: Lives with: lives alone Lives in: House/apartment Stairs:  one home Has following equipment at home: Grab bars  OCCUPATION: Retired  PLOF: Independent and Leisure: gardening, reading, walking her dog, socializing with friends  PATIENT GOALS: To be stronger to be able to return to gardening and other desired tasks.  NEXT MD VISIT: As Needed  OBJECTIVE:  Note: Objective measures were completed at Evaluation unless otherwise noted.  DIAGNOSTIC FINDINGS:  Lumbar CT on 08/19/2023: IMPRESSION: 1. Redemonstrated subacute appearing compression fractures of the L1 and L2 superior endplates with slight interval progression of height loss at L1, now moderate. Unchanged moderate height loss at L2. 2. Mild spinal canal stenosis at T12-L1, L1-L2, and L3-L4. 3. Moderate spinal canal stenosis at L4-L5. 4. Cholelithiasis.  PATIENT SURVEYS:  Eval:  FOTO 51 (projected 40 by visit 12) 12/28/2023:  FOTO 61  COGNITION: Overall cognitive status: Within functional limits for tasks assessed     SENSATION: Patient denies and numbness or tingling  MUSCLE LENGTH: Hamstrings: Tightness bilaterally  POSTURE: rounded shoulders, forward head, and flexed trunk    LUMBAR ROM:   Eval:   Patient with painful lumbar ROM, limited at least 50%  LOWER EXTREMITY ROM:     WFL  LOWER EXTREMITY MMT:    Eval:   Right LE strength is grossly WFL Left hip strength is 4/5  LUMBAR SPECIAL TESTS:  Slump test: Negative  FUNCTIONAL TESTS:  Eval: 5  times sit to stand: 15.07 sec 3 minute walk test: 560 ft with slight increase in pain in right flank area  12/26/2023: 3 minute walk test: 591 ft  01/02/2024: 5 times sit to stand:  10.16 sec 3 minute walk test:  694 ft  GAIT: Distance walked: >500 ft Assistive device utilized: None Level of assistance: Complete Independence Comments: Patient with antalgic gait pattern with decreased step length and Trendelenburg gait  TODAY'S TREATMENT  DATE: 01/04/2024 Nustep level 5 x6 min with PT present to discuss status Seated hamstring stretch 2x20 sec bilat Seated piriformis stretch 2x20 sec bilat Seated modified dead lift with 5# kettle bell 2x10 Farmers carry with 5# kettlebell with marching x10 with weight in each hand Seated transversus abdominus contraction with pressing hands into ball on knees 2x10 Leg Press (seat at 7) 50# 2x10 Standing row with green tband 2x10 Standing shoulder extension with green tband 2x10 Standing pallof press with green tband x10 bilat Retrogait with 10# cable pulley x10 Farmer's carry down hallway to PT gym and back with 8# dumbbell x2 laps each hand Standing balance on rocker board x1 min   DATE: 01/02/2024 Nustep level 5 x5 min with PT present to discuss status 3 minute walk test:  694 ft Seated hamstring stretch 2x20 sec bilat Seated piriformis stretch x20 sec bilat Seated green pball rollout 8x5 sec hold Seated modified dead lift with 5# kettle bell 2x10 Farmers carry with 5# kettlebell with marching x10 with weight in each hand Leg Press (seat at 7) 50# 2x10 Standing row with green tband 2x10 Standing shoulder extension with green tband 2x10 Standing pallof press with green tband x10  bilat Retrogait with 10# cable pulley x10 Standing balance on rocker board x1 min   DATE: 12/28/2023 Nustep level 5 x6 min with PT present to discuss status Seated with 4# ankle weights:  LAQ, marching, hip ER.  2x10 each bilat Sit to/from stand holding 5# kettlebell:  x10 with chest press, x10 with overhead press Seated modified dead lift with 5# kettle bell 2x10 Farmers carry with 5# kettlebell with marching x10 with weight in each hand Leg Press (seat at 7) 50# 3x5 Seated row 10# 2x10 Retrogait with 10# cable pulley x10 Standing balance on rocker board x1 min    PATIENT EDUCATION:  Education details: Issued HEP and provided handout and education on dry needling Person educated: Patient Education method: Programmer, multimedia, Demonstration, and Handouts Education comprehension: verbalized understanding and returned demonstration  HOME EXERCISE PROGRAM: Access Code: [email or web address] URL: [email or web address] Date: 11/21/2023 Prepared by: Clydie Braun Adriena Manfre  Exercises - Seated Hamstring Stretch  - 1 x daily - 7 x weekly - 2 reps - 20 sec hold - Seated Piriformis Stretch with Trunk Bend  - 1 x daily - 7 x weekly - 2 reps - 20 sec hold - Seated Transversus Abdominis Bracing  - 1 x daily - 7 x weekly - 2 sets - 10 reps - Standing 'L' Stretch at Counter  - 1 x daily - 7 x weekly - 2 reps - 20 sec  hold - Seated Long Arc Quad  - 1 x daily - 7 x weekly - 2 sets - 10 reps - Sit to Stand Without Arm Support  - 1 x daily - 7 x weekly - 5 sets - 5-10 reps - Supine Lower Trunk Rotation  - 1 x daily - 7 x weekly - 10 reps - Supine March with Posterior Pelvic Tilt  - 1 x daily - 7 x  weekly - 2 sets - 10 reps - Isometric Dead Bug  - 1 x daily - 7 x weekly - 5 reps - 10 sec hold  ASSESSMENT:  CLINICAL IMPRESSION: Ms Holts presents to skilled PT with reports of less pain than last session.  Patient continues to progress with strengthening and core stability during functional tasks.  Patient  with some reports of dyspnea and A-Fib after Farmer's Carry with 8# dumbbell and required a seated recovery period following to monitor progress prior to last balance exercise.  Patient continues to require skilled PT to progress towards goal related activities.  OBJECTIVE IMPAIRMENTS: decreased balance, difficulty walking, decreased strength, increased muscle spasms, impaired flexibility, postural dysfunction, and pain.   ACTIVITY LIMITATIONS: carrying, lifting, bending, standing, and squatting  PARTICIPATION LIMITATIONS: cleaning, shopping, community activity, and yard work  PERSONAL FACTORS: Past/current experiences and 3+ comorbidities: s/p lumbar kyphoplasty, right ankle fusion, osteopenia  are also affecting patient's functional outcome.   REHAB POTENTIAL: Good  CLINICAL DECISION MAKING: Stable/uncomplicated  EVALUATION COMPLEXITY: Low   GOALS: Goals reviewed with patient? Yes  SHORT TERM GOALS: Target date: 12/09/2023  Patient will be independent with initial HEP. Baseline: Goal status: Met on 11/23/23  2.  Patient will report at least a 25% improvement in symptoms since starting PT. Baseline:  Goal status: Met on 12/07/2023   LONG TERM GOALS: Target date: 01/13/2024  Patient will be independent with advanced HEP to allow for self progression after discharge. Baseline:  Goal status: Ongoing  2.  Patient will increase LE and trunk functional strength to [facility] to allow her to lift buckets of dirt for gardening in the Spring. Baseline:  Goal status: Ongoing  3.  Patient will report ability to return to walking her dog daily on previous route without increased pain. Baseline:  Goal status: Ongoing  4.  Patient will increase FOTO to at least 61 to demonstrate improved functional mobility. Baseline: 51 Goal status: Met on 12/28/23  5.  Patient to report ability to return to going out to dinner or other outings with her friends without increased pain or muscle  spasms.  Baseline:   Goal status:  Ongoing    PLAN:  PT FREQUENCY: 2x/week  PT DURATION: 8 weeks  PLANNED INTERVENTIONS: 97164- PT Re-evaluation, 97110-Therapeutic exercises, 97530- Therapeutic activity, 97112- Neuromuscular re-education, 97535- Self Care, 16109- Manual therapy, [phone]- Gait training, [phone]- Canalith repositioning, [id number]- Aquatic Therapy, 97014- Electrical stimulation (unattended), [id number]- Electrical stimulation (manual), [id number]- Ultrasound, Patient/Family education, Balance training, Stair training, Taping, Dry Needling, Joint mobilization, Joint manipulation, Spinal manipulation, Spinal mobilization, Scar mobilization, Vestibular training, Cryotherapy, and Moist heat.  PLAN FOR NEXT SESSION: Assess and progress HEP as indicated, strengthening, core stability, flexibility, manual/dry needling as indicated   Reather Laurence, PT, DPT 01/04/24, 1:20 PM  [facility] [address], [address] [place], [place] [address] Phone # [phone] Fax [phone]

## 2024-01-09 ENCOUNTER — Ambulatory Visit: Payer: Medicare Other | Admitting: Rehabilitative and Restorative Service Providers"

## 2024-01-09 ENCOUNTER — Encounter: Payer: Self-pay | Admitting: Rehabilitative and Restorative Service Providers"

## 2024-01-09 DIAGNOSIS — R252 Cramp and spasm: Secondary | ICD-10-CM | POA: Diagnosis not present

## 2024-01-09 DIAGNOSIS — M5459 Other low back pain: Secondary | ICD-10-CM | POA: Diagnosis not present

## 2024-01-09 DIAGNOSIS — M6281 Muscle weakness (generalized): Secondary | ICD-10-CM

## 2024-01-09 NOTE — Therapy (Signed)
 OUTPATIENT PHYSICAL THERAPY TREATMENT NOTE   Patient Name: Mary Potter MRN: [id number] DOB:07-17-1946, 78 y.o., female Today's Date: 01/09/2024   END OF SESSION:  PT End of Session - 01/09/24 1233     Visit Number 12    Date for PT Re-Evaluation 01/13/24    Authorization Type Medicare    Progress Note Due on Visit 20    PT Start Time 1230    PT Stop Time 1310    PT Time Calculation (min) 40 min    Activity Tolerance Patient tolerated treatment well    Behavior During Therapy [facility] for tasks assessed/performed               Past Medical History:  Diagnosis Date   Atrial fibrillation (HCC)    Dysrhythmia    A-fib   Endometrial hyperplasia 01/03/2015   GERD (gastroesophageal reflux disease)    History of kidney stones    Hyperlipidemia    Hypertension    Pneumonia    PONV (postoperative nausea and vomiting)    slow to wake up   Thickened endometrium 01/03/2015   Vaginal delivery 1976, 1985   Past Surgical History:  Procedure Laterality Date   ANKLE FRACTURE SURGERY     ATRIAL FIBRILLATION ABLATION N/A 02/08/2022   Procedure: ATRIAL FIBRILLATION ABLATION;  Surgeon: Lanier Prude, MD;  Location: [facility];  Service: Cardiovascular;  Laterality: N/A;   CARDIOVERSION N/A 02/17/2023   Procedure: CARDIOVERSION;  Surgeon: Thomasene Ripple, DO;  Location: [facility];  Service: Cardiovascular;  Laterality: N/A;   CATARACT EXTRACTION Right 2020   DILATION AND CURETTAGE OF UTERUS  2012   ablation    EYE SURGERY     Corneal growth removal on right eye   HYSTEROSCOPY WITH D & C N/A 01/03/2015   Procedure: DILATATION AND CURETTAGE /HYSTEROSCOPY;  Surgeon: Shea Evans, MD;  Location: [facility];  Service: Gynecology;  Laterality: N/A;   LAPAROSCOPY     for endometriosis   ROBOTIC ASSISTED TOTAL HYSTERECTOMY WITH BILATERAL SALPINGO OOPHERECTOMY N/A 03/17/2021   Procedure: XI ROBOTIC ASSISTED TOTAL HYSTERECTOMY WITH BILATERAL SALPINGO OOPHORECTOMY;  Surgeon:  Carver Fila, MD;  Location: [facility];  Service: Gynecology;  Laterality: N/A;   SENTINEL NODE BIOPSY N/A 03/17/2021   Procedure: SENTINEL NODE BIOPSY;  Surgeon: Carver Fila, MD;  Location: [facility];  Service: Gynecology;  Laterality: N/A;   TONSILLECTOMY AND ADENOIDECTOMY     Patient Active Problem List   Diagnosis Date Noted   Coronary artery disease involving native coronary artery of native heart without angina pectoris 03/28/2023   Hypercoagulable state due to paroxysmal atrial fibrillation (HCC) 03/02/2023   Persistent atrial fibrillation (HCC) 03/02/2023   Endometrial adenocarcinoma (HCC) 03/20/2021   Complex endometrial hyperplasia with atypia 02/18/2021   PAF (paroxysmal atrial fibrillation) (HCC) 10/15/2020   Elevated coronary artery calcium score 09/14/2020   Essential hypertension 09/14/2020   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 06/29/2018   Greater trochanteric bursitis of left hip 04/18/2018   Acute left lumbar radiculopathy 03/21/2018   Iliotibial band tendinitis of right side 09/30/2016   Trigger finger, right middle finger 09/30/2016   Thickened endometrium 01/03/2015   Endometrial hyperplasia 01/03/2015   Tibialis posterior tendonitis 05/09/2014   H/O ankle fusion 05/09/2014   Left leg pain 04/17/2014   Lipid disorder 11/14/2012   GERD 09/11/2010   CONSTIPATION 09/11/2010   DIARRHEA 09/11/2010    PCP: Alysia Penna, MD  REFERRING PROVIDER: Floreen Comber,  NP  REFERRING DIAG: S32.010A (ICD-10-CM) - Wedge compression fracture of first lumbar vertebra (HCC)  Rationale for Evaluation and Treatment: Rehabilitation  THERAPY DIAG:  Other low back pain  Cramp and spasm  Muscle weakness (generalized)  ONSET DATE: s/p kyphoplasty on 09/05/2023  SUBJECTIVE:                                                                                                                                                                                            SUBJECTIVE STATEMENT: Patient states that she was able to walk around [facility] for 45 min to an hour to walk around and shop.  PERTINENT HISTORY:  Right Ankle Fusion, s/p kyphoplasty on 09/05/2023, osteopenia Pt states that she is being worked up at the [facility] [employer] [facility] to rule out Multiple Myeloma  PAIN:  Are you having pain? Yes: NPRS scale: 2/10 Pain location: right flank area of low back Pain description: soreness Aggravating factors: walking, reaching Relieving factors: sitting  PRECAUTIONS: None  RED FLAGS: Compression fracture: Yes: s/p kyphoplasty    WEIGHT BEARING RESTRICTIONS: No  FALLS:  Has patient fallen in last 6 months? No  LIVING ENVIRONMENT: Lives with: lives alone Lives in: House/apartment Stairs:  one home Has following equipment at home: Grab bars  OCCUPATION: Retired  PLOF: Independent and Leisure: gardening, reading, walking her dog, socializing with friends  PATIENT GOALS: To be stronger to be able to return to gardening and other desired tasks.  NEXT MD VISIT: As Needed  OBJECTIVE:  Note: Objective measures were completed at Evaluation unless otherwise noted.  DIAGNOSTIC FINDINGS:  Lumbar CT on 08/19/2023: IMPRESSION: 1. Redemonstrated subacute appearing compression fractures of the L1 and L2 superior endplates with slight interval progression of height loss at L1, now moderate. Unchanged moderate height loss at L2. 2. Mild spinal canal stenosis at T12-L1, L1-L2, and L3-L4. 3. Moderate spinal canal stenosis at L4-L5. 4. Cholelithiasis.  PATIENT SURVEYS:  Eval:  FOTO 51 (projected 22 by visit 12) 12/28/2023:  FOTO 61  COGNITION: Overall cognitive status: Within functional limits for tasks assessed     SENSATION: Patient denies and numbness or tingling  MUSCLE LENGTH: Hamstrings: Tightness bilaterally  POSTURE: rounded shoulders, forward head, and flexed trunk    LUMBAR ROM:   Eval:  Patient with painful  lumbar ROM, limited at least 50%  LOWER EXTREMITY ROM:     WFL  LOWER EXTREMITY MMT:    Eval:   Right LE strength is grossly WFL Left hip strength is 4/5  LUMBAR SPECIAL TESTS:  Slump test: Negative  FUNCTIONAL TESTS:  Eval: 5 times sit to stand: 15.07 sec  3 minute walk test: 560 ft with slight increase in pain in right flank area  12/26/2023: 3 minute walk test: 591 ft  01/02/2024: 5 times sit to stand:  10.16 sec 3 minute walk test:  694 ft  GAIT: Distance walked: >500 ft Assistive device utilized: None Level of assistance: Complete Independence Comments: Patient with antalgic gait pattern with decreased step length and Trendelenburg gait  TODAY'S TREATMENT  DATE: 01/09/2024 Nustep level 5 x6 min with PT present to discuss status Seated hamstring stretch 2x20 sec bilat Seated piriformis stretch 2x20 sec bilat Seated modified dead lift with 5# kettle bell 2x10 Farmers carry with 5# kettlebell with marching x10 with weight in each hand Hip Matrix 25# for hip abduction and hip extension x7 each bilat Farmer's carry down hallway to PT gym and back with 8# dumbbell x2 laps each hand Standing row with green tband 2x10 Standing shoulder extension with green tband 2x10   DATE: 01/04/2024 Nustep level 5 x6 min with PT present to discuss status Seated hamstring stretch 2x20 sec bilat Seated piriformis stretch 2x20 sec bilat Seated modified dead lift with 5# kettle bell 2x10 Farmers carry with 5# kettlebell with marching x10 with weight in each hand Seated transversus abdominus contraction with pressing hands into ball on knees 2x10 Leg Press (seat at 7) 50# 2x10 Standing row with green tband 2x10 Standing shoulder extension with green tband 2x10 Standing pallof press with green tband x10 bilat Retrogait with 10# cable pulley x10 Farmer's carry down hallway to PT gym and back with 8# dumbbell x2 laps each hand Standing balance on rocker board x1 min   DATE:  01/02/2024 Nustep level 5 x5 min with PT present to discuss status 3 minute walk test:  694 ft Seated hamstring stretch 2x20 sec bilat Seated piriformis stretch x20 sec bilat Seated green pball rollout 8x5 sec hold Seated modified dead lift with 5# kettle bell 2x10 Farmers carry with 5# kettlebell with marching x10 with weight in each hand Leg Press (seat at 7) 50# 2x10 Standing row with green tband 2x10 Standing shoulder extension with green tband 2x10 Standing pallof press with green tband x10 bilat Retrogait with 10# cable pulley x10 Standing balance on rocker board x1 min    PATIENT EDUCATION:  Education details: Issued HEP and provided handout and education on dry needling Person educated: Patient Education method: Programmer, multimedia, Demonstration, and Handouts Education comprehension: verbalized understanding and returned demonstration  HOME EXERCISE PROGRAM: Access Code: [email or web address] URL: [email or web address] Date: 11/21/2023 Prepared by: Clydie Braun Garry Bochicchio  Exercises - Seated Hamstring Stretch  - 1 x daily - 7 x weekly - 2 reps - 20 sec hold - Seated Piriformis Stretch with Trunk Bend  - 1 x daily - 7 x weekly - 2 reps - 20 sec hold - Seated Transversus Abdominis Bracing  - 1 x daily - 7 x weekly - 2 sets - 10 reps - Standing 'L' Stretch at Counter  - 1 x daily - 7 x weekly - 2 reps - 20 sec  hold - Seated Long Arc Quad  - 1 x daily - 7 x weekly - 2 sets - 10 reps - Sit to Stand Without Arm Support  - 1 x daily - 7 x weekly - 5 sets - 5-10 reps - Supine Lower Trunk Rotation  - 1 x daily - 7 x weekly - 10 reps - Supine March with Posterior Pelvic Tilt  - 1 x daily - 7 x weekly - 2 sets - 10  reps - Isometric Dead Bug  - 1 x daily - 7 x weekly - 5 reps - 10 sec hold  ASSESSMENT:  CLINICAL IMPRESSION: Ms Galvao presents to skilled PT with reporting that she was able to go shopping at [facility] this weekend, but did have some fatigue after about 45 min to an  hour.  Patient able to progress to using Hip Matrix weight machine.  Patient is progressing with improved core stability.  Patient continues to progress towards goal related activities.  OBJECTIVE IMPAIRMENTS: decreased balance, difficulty walking, decreased strength, increased muscle spasms, impaired flexibility, postural dysfunction, and pain.   ACTIVITY LIMITATIONS: carrying, lifting, bending, standing, and squatting  PARTICIPATION LIMITATIONS: cleaning, shopping, community activity, and yard work  PERSONAL FACTORS: Past/current experiences and 3+ comorbidities: s/p lumbar kyphoplasty, right ankle fusion, osteopenia  are also affecting patient's functional outcome.   REHAB POTENTIAL: Good  CLINICAL DECISION MAKING: Stable/uncomplicated  EVALUATION COMPLEXITY: Low   GOALS: Goals reviewed with patient? Yes  SHORT TERM GOALS: Target date: 12/09/2023  Patient will be independent with initial HEP. Baseline: Goal status: Met on 11/23/23  2.  Patient will report at least a 25% improvement in symptoms since starting PT. Baseline:  Goal status: Met on 12/07/2023   LONG TERM GOALS: Target date: 01/13/2024  Patient will be independent with advanced HEP to allow for self progression after discharge. Baseline:  Goal status: Ongoing  2.  Patient will increase LE and trunk functional strength to [facility] to allow her to lift buckets of dirt for gardening in the Spring. Baseline:  Goal status: Ongoing  3.  Patient will report ability to return to walking her dog daily on previous route without increased pain. Baseline:  Goal status: Ongoing  4.  Patient will increase FOTO to at least 61 to demonstrate improved functional mobility. Baseline: 51 Goal status: Met on 12/28/23  5.  Patient to report ability to return to going out to dinner or other outings with her friends without increased pain or muscle spasms.  Baseline:   Goal status:  Ongoing    PLAN:  PT FREQUENCY: 2x/week  PT  DURATION: 8 weeks  PLANNED INTERVENTIONS: 97164- PT Re-evaluation, 97110-Therapeutic exercises, 97530- Therapeutic activity, 97112- Neuromuscular re-education, 97535- Self Care, [id number]- Manual therapy, [phone]- Gait training, [phone]- Canalith repositioning, [id number]- Aquatic Therapy, [id number]- Electrical stimulation (unattended), [id number]- Electrical stimulation (manual), [id number]- Ultrasound, Patient/Family education, Balance training, Stair training, Taping, Dry Needling, Joint mobilization, Joint manipulation, Spinal manipulation, Spinal mobilization, Scar mobilization, Vestibular training, Cryotherapy, and Moist heat.  PLAN FOR NEXT SESSION: Assess and progress HEP as indicated, strengthening, core stability, flexibility, manual/dry needling as indicated   Reather Laurence, PT, DPT 01/09/24, 1:22 PM  [facility] [address] [place], [place] [address] Phone # [phone] Fax [phone]

## 2024-01-11 DIAGNOSIS — Z01419 Encounter for gynecological examination (general) (routine) without abnormal findings: Secondary | ICD-10-CM | POA: Diagnosis not present

## 2024-01-11 DIAGNOSIS — Z124 Encounter for screening for malignant neoplasm of cervix: Secondary | ICD-10-CM | POA: Diagnosis not present

## 2024-01-11 DIAGNOSIS — Z01411 Encounter for gynecological examination (general) (routine) with abnormal findings: Secondary | ICD-10-CM | POA: Diagnosis not present

## 2024-01-11 DIAGNOSIS — Z79890 Hormone replacement therapy (postmenopausal): Secondary | ICD-10-CM | POA: Diagnosis not present

## 2024-01-11 DIAGNOSIS — N950 Postmenopausal bleeding: Secondary | ICD-10-CM | POA: Diagnosis not present

## 2024-01-11 DIAGNOSIS — Z1331 Encounter for screening for depression: Secondary | ICD-10-CM | POA: Diagnosis not present

## 2024-01-12 ENCOUNTER — Encounter: Payer: Self-pay | Admitting: Rehabilitative and Restorative Service Providers"

## 2024-01-12 ENCOUNTER — Ambulatory Visit: Payer: Medicare Other | Admitting: Rehabilitative and Restorative Service Providers"

## 2024-01-12 DIAGNOSIS — R252 Cramp and spasm: Secondary | ICD-10-CM

## 2024-01-12 DIAGNOSIS — M5459 Other low back pain: Secondary | ICD-10-CM | POA: Diagnosis not present

## 2024-01-12 DIAGNOSIS — M6281 Muscle weakness (generalized): Secondary | ICD-10-CM

## 2024-01-12 NOTE — Therapy (Signed)
 OUTPATIENT PHYSICAL THERAPY TREATMENT NOTE AND REASSESSMENT NOTE   Patient Name: GWYNNE KEMNITZ MRN: [id number] DOB:06-05-1946, 78 y.o., female Today's Date: 01/12/2024   Progress Note Reporting Period 11/18/2023 to 01/12/2024  See note below for Objective Data and Assessment of Progress/Goals.     END OF SESSION:  PT End of Session - 01/12/24 1234     Visit Number 13    Date for PT Re-Evaluation 03/09/24    Authorization Type Medicare    Progress Note Due on Visit 23    PT Start Time 1233    PT Stop Time 1312    PT Time Calculation (min) 39 min    Activity Tolerance Patient tolerated treatment well    Behavior During Therapy WFL for tasks assessed/performed               Past Medical History:  Diagnosis Date   Atrial fibrillation (HCC)    Dysrhythmia    A-fib   Endometrial hyperplasia 01/03/2015   GERD (gastroesophageal reflux disease)    History of kidney stones    Hyperlipidemia    Hypertension    Pneumonia    PONV (postoperative nausea and vomiting)    slow to wake up   Thickened endometrium 01/03/2015   Vaginal delivery 1976, 1985   Past Surgical History:  Procedure Laterality Date   ANKLE FRACTURE SURGERY     ATRIAL FIBRILLATION ABLATION N/A 02/08/2022   Procedure: ATRIAL FIBRILLATION ABLATION;  Surgeon: Lanier Prude, MD;  Location: [facility];  Service: Cardiovascular;  Laterality: N/A;   CARDIOVERSION N/A 02/17/2023   Procedure: CARDIOVERSION;  Surgeon: Thomasene Ripple, DO;  Location: [facility];  Service: Cardiovascular;  Laterality: N/A;   CATARACT EXTRACTION Right 2020   DILATION AND CURETTAGE OF UTERUS  2012   ablation    EYE SURGERY     Corneal growth removal on right eye   HYSTEROSCOPY WITH D & C N/A 01/03/2015   Procedure: DILATATION AND CURETTAGE /HYSTEROSCOPY;  Surgeon: Shea Evans, MD;  Location: [facility];  Service: Gynecology;  Laterality: N/A;   LAPAROSCOPY     for endometriosis   ROBOTIC ASSISTED TOTAL HYSTERECTOMY  WITH BILATERAL SALPINGO OOPHERECTOMY N/A 03/17/2021   Procedure: XI ROBOTIC ASSISTED TOTAL HYSTERECTOMY WITH BILATERAL SALPINGO OOPHORECTOMY;  Surgeon: Carver Fila, MD;  Location: [facility];  Service: Gynecology;  Laterality: N/A;   SENTINEL NODE BIOPSY N/A 03/17/2021   Procedure: SENTINEL NODE BIOPSY;  Surgeon: Carver Fila, MD;  Location: [facility];  Service: Gynecology;  Laterality: N/A;   TONSILLECTOMY AND ADENOIDECTOMY     Patient Active Problem List   Diagnosis Date Noted   Coronary artery disease involving native coronary artery of native heart without angina pectoris 03/28/2023   Hypercoagulable state due to paroxysmal atrial fibrillation (HCC) 03/02/2023   Persistent atrial fibrillation (HCC) 03/02/2023   Endometrial adenocarcinoma (HCC) 03/20/2021   Complex endometrial hyperplasia with atypia 02/18/2021   PAF (paroxysmal atrial fibrillation) (HCC) 10/15/2020   Elevated coronary artery calcium score 09/14/2020   Essential hypertension 09/14/2020   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 06/29/2018   Greater trochanteric bursitis of left hip 04/18/2018   Acute left lumbar radiculopathy 03/21/2018   Iliotibial band tendinitis of right side 09/30/2016   Trigger finger, right middle finger 09/30/2016   Thickened endometrium 01/03/2015   Endometrial hyperplasia 01/03/2015   Tibialis posterior tendonitis 05/09/2014   H/O ankle fusion 05/09/2014   Left leg pain 04/17/2014   Lipid disorder 11/14/2012  GERD 09/11/2010   CONSTIPATION 09/11/2010   DIARRHEA 09/11/2010    PCP: Alysia Penna, MD  REFERRING PROVIDER: Floreen Comber, NP  REFERRING DIAG: S32.010A (ICD-10-CM) - Wedge compression fracture of first lumbar vertebra [facility])  Rationale for Evaluation and Treatment: Rehabilitation  THERAPY DIAG:  Other low back pain - Plan: PT plan of care cert/re-cert  Cramp and spasm - Plan: PT plan of care cert/re-cert  Muscle weakness (generalized) - Plan:  PT plan of care cert/re-cert  ONSET DATE: s/p kyphoplasty on 09/05/2023  SUBJECTIVE:                                                                                                                                                                                           SUBJECTIVE STATEMENT: Patient denies pain.  However, she states that she is not ready to discharge from PT yet, as she feels that she just started feeling better and does not want to impede her progress.  PERTINENT HISTORY:  Right Ankle Fusion, s/p kyphoplasty on 09/05/2023, osteopenia   PAIN:  Are you having pain? Yes: NPRS scale: 0/10 Pain location: right flank area of low back Pain description: soreness Aggravating factors: walking, reaching Relieving factors: sitting  PRECAUTIONS: None  RED FLAGS: Compression fracture: Yes: s/p kyphoplasty    WEIGHT BEARING RESTRICTIONS: No  FALLS:  Has patient fallen in last 6 months? No  LIVING ENVIRONMENT: Lives with: lives alone Lives in: House/apartment Stairs:  one home Has following equipment at home: Grab bars  OCCUPATION: Retired  PLOF: Independent and Leisure: gardening, reading, walking her dog, socializing with friends  PATIENT GOALS: To be stronger to be able to return to gardening and other desired tasks.  NEXT MD VISIT: As Needed  OBJECTIVE:  Note: Objective measures were completed at Evaluation unless otherwise noted.  DIAGNOSTIC FINDINGS:  Lumbar CT on 08/19/2023: IMPRESSION: 1. Redemonstrated subacute appearing compression fractures of the L1 and L2 superior endplates with slight interval progression of height loss at L1, now moderate. Unchanged moderate height loss at L2. 2. Mild spinal canal stenosis at T12-L1, L1-L2, and L3-L4. 3. Moderate spinal canal stenosis at L4-L5. 4. Cholelithiasis.  PATIENT SURVEYS:  Eval:  FOTO 51 (projected 61 by visit 12) 12/28/2023:  FOTO 61 (goal met)  COGNITION: Overall cognitive status: Within functional  limits for tasks assessed     SENSATION: Patient denies and numbness or tingling  MUSCLE LENGTH: Hamstrings: Tightness bilaterally  POSTURE: rounded shoulders, forward head, and flexed trunk    LUMBAR ROM:   Eval:  Patient with painful lumbar ROM, limited at least 50%  LOWER EXTREMITY ROM:     [facility]  LOWER EXTREMITY MMT:    Eval:   Right LE strength is grossly WFL Left hip strength is 4/5  LUMBAR SPECIAL TESTS:  Slump test: Negative  FUNCTIONAL TESTS:  Eval: 5 times sit to stand: 15.07 sec 3 minute walk test: 560 ft with slight increase in pain in right flank area  12/26/2023: 3 minute walk test: 591 ft  01/02/2024: 5 times sit to stand:  10.16 sec 3 minute walk test:  694 ft  01/12/2024: 6 minute walk test:  1226 ft with RPE of 2009-02-26 (age related norms are 1,304 ft)  GAIT: Distance walked: >500 ft Assistive device utilized: None Level of assistance: Complete Independence Comments: Patient with antalgic gait pattern with decreased step length and Trendelenburg gait  TODAY'S TREATMENT  DATE: 01/12/2024 Nustep level 5 x4 min with PT present to discuss status Seated hamstring stretch 2x20 sec bilat Seated piriformis stretch 2x20 sec bilat 6 minute walk test:  1226 ft with RPE of Feb 26, 2009 Seated modified dead lift with 5# kettle bell 2x10 Farmers carry with 5# kettlebell with marching x10 with weight in each hand Standing row with green tband 2x10 Standing shoulder extension with green tband 2x10 Standing paloff press with green tband x10 bilat Seated blue pball rollout x10 Tandem stance x15 sec bilat   DATE: 01/09/2024 Nustep level 5 x6 min with PT present to discuss status Seated hamstring stretch 2x20 sec bilat Seated piriformis stretch 2x20 sec bilat Seated modified dead lift with 5# kettle bell 2x10 Farmers carry with 5# kettlebell with marching x10 with weight in each hand Hip Matrix 25# for hip abduction and hip extension x7 each bilat Farmer's carry  down hallway to PT gym and back with 8# dumbbell x2 laps each hand Standing row with green tband 2x10 Standing shoulder extension with green tband 2x10   DATE: 01/04/2024 Nustep level 5 x6 min with PT present to discuss status Seated hamstring stretch 2x20 sec bilat Seated piriformis stretch 2x20 sec bilat Seated modified dead lift with 5# kettle bell 2x10 Farmers carry with 5# kettlebell with marching x10 with weight in each hand Seated transversus abdominus contraction with pressing hands into ball on knees 2x10 Leg Press (seat at 7) 50# 2x10 Standing row with green tband 2x10 Standing shoulder extension with green tband 2x10 Standing pallof press with green tband x10 bilat Retrogait with 10# cable pulley x10 Farmer's carry down hallway to PT gym and back with 8# dumbbell x2 laps each hand Standing balance on rocker board x1 min    PATIENT EDUCATION:  Education details: Issued HEP and provided handout and education on dry needling Person educated: Patient Education method: Explanation, Demonstration, and Handouts Education comprehension: verbalized understanding and returned demonstration  HOME EXERCISE PROGRAM: Access Code: [email or web address] URL: [email or web address] Date: 11/21/2023 Prepared by: Clydie Braun Carollynn Pennywell  Exercises - Seated Hamstring Stretch  - 1 x daily - 7 x weekly - 2 reps - 20 sec hold - Seated Piriformis Stretch with Trunk Bend  - 1 x daily - 7 x weekly - 2 reps - 20 sec hold - Seated Transversus Abdominis Bracing  - 1 x daily - 7 x weekly - 2 sets - 10 reps - Standing 'L' Stretch at Counter  - 1 x daily - 7 x weekly - 2 reps - 20 sec  hold - Seated Long Arc Quad  - 1 x daily - 7 x weekly - 2 sets - 10 reps - Sit to Stand Without Arm Support  - 1 x daily - 7  x weekly - 5 sets - 5-10 reps - Supine Lower Trunk Rotation  - 1 x daily - 7 x weekly - 10 reps - Supine March with Posterior Pelvic Tilt  - 1 x daily - 7 x weekly - 2 sets - 10 reps - Isometric Dead  Bug  - 1 x daily - 7 x weekly - 5 reps - 10 sec hold  ASSESSMENT:  CLINICAL IMPRESSION: Ms Benedict presents to skilled PT with reports of no pain today.  Patient reports that she still has some difficulty walking her small dog secondary to him being an active dog.  Patient also continues to report that she fatigues more quickly now than she previously did.  Patient able to complete a 6 minute walk test today with elevated RPE, but she was ambulating less distance than her age related norms.  Patient has not yet met all goals and would benefit from continued PT.  Recommend patient continue PT 1x/week for 8 additional weeks to further progress towards goal related activities.  OBJECTIVE IMPAIRMENTS: decreased balance, difficulty walking, decreased strength, increased muscle spasms, impaired flexibility, postural dysfunction, and pain.   ACTIVITY LIMITATIONS: carrying, lifting, bending, standing, and squatting  PARTICIPATION LIMITATIONS: cleaning, shopping, community activity, and yard work  PERSONAL FACTORS: Past/current experiences and 3+ comorbidities: s/p lumbar kyphoplasty, right ankle fusion, osteopenia  are also affecting patient's functional outcome.   REHAB POTENTIAL: Good  CLINICAL DECISION MAKING: Stable/uncomplicated  EVALUATION COMPLEXITY: Low   GOALS: Goals reviewed with patient? Yes  SHORT TERM GOALS: Target date: 12/09/2023  Patient will be independent with initial HEP. Baseline: Goal status: Met on 11/23/23  2.  Patient will report at least a 25% improvement in symptoms since starting PT. Baseline:  Goal status: Met on 12/07/2023   LONG TERM GOALS: Target date: 03/09/2024  Patient will be independent with advanced HEP to allow for self progression after discharge. Baseline:  Goal status: Ongoing  2.  Patient will increase LE and trunk functional strength to [facility] to allow her to lift buckets of dirt for gardening in the Spring. Baseline:  Goal status: Ongoing  3.   Patient will report ability to return to walking her dog daily on previous route without increased pain. Baseline:  Goal status: Ongoing  4.  Patient will increase FOTO to at least 61 to demonstrate improved functional mobility. Baseline: 51 Goal status: Met on 12/28/23  5.  Patient to report ability to return to going out to dinner or other outings with her friends without increased pain or muscle spasms.  Baseline:   Goal status:  Ongoing    PLAN:  PT FREQUENCY: 1x/week  PT DURATION: 8 weeks  PLANNED INTERVENTIONS: 97164- PT Re-evaluation, 97110-Therapeutic exercises, 97530- Therapeutic activity, [id number]- Neuromuscular re-education, 97535- Self Care, 16109- Manual therapy, [id number]- Gait training, [phone]- Canalith repositioning, [id number]- Aquatic Therapy, 97014- Electrical stimulation (unattended), [id number]- Electrical stimulation (manual), [id number]- Ultrasound, Patient/Family education, Balance training, Stair training, Taping, Dry Needling, Joint mobilization, Joint manipulation, Spinal manipulation, Spinal mobilization, Scar mobilization, Vestibular training, Cryotherapy, and Moist heat.  PLAN FOR NEXT SESSION: Assess and progress HEP as indicated, strengthening, core stability, flexibility, manual/dry needling as indicated   Reather Laurence, PT, DPT 01/12/24, 1:24 PM  [facility] [address], [address] [place], [place] [address] Phone # [phone] Fax [phone]

## 2024-01-18 ENCOUNTER — Encounter: Payer: Self-pay | Admitting: Rehabilitative and Restorative Service Providers"

## 2024-01-18 ENCOUNTER — Ambulatory Visit: Admitting: Rehabilitative and Restorative Service Providers"

## 2024-01-18 DIAGNOSIS — R252 Cramp and spasm: Secondary | ICD-10-CM

## 2024-01-18 DIAGNOSIS — M6281 Muscle weakness (generalized): Secondary | ICD-10-CM | POA: Diagnosis not present

## 2024-01-18 DIAGNOSIS — M5459 Other low back pain: Secondary | ICD-10-CM

## 2024-01-18 NOTE — Therapy (Signed)
 OUTPATIENT PHYSICAL THERAPY TREATMENT NOTE   Patient Name: Mary Potter MRN: [id number] DOB:1946-01-16, 78 y.o., female Today's Date: 01/18/2024    END OF SESSION:  PT End of Session - 01/18/24 1233     Visit Number 14    Date for PT Re-Evaluation 03/09/24    Authorization Type Medicare    Progress Note Due on Visit 23    PT Start Time 1230    PT Stop Time 1310    PT Time Calculation (min) 40 min    Activity Tolerance Patient tolerated treatment well    Behavior During Therapy [facility] for tasks assessed/performed               Past Medical History:  Diagnosis Date   Atrial fibrillation (HCC)    Dysrhythmia    A-fib   Endometrial hyperplasia 01/03/2015   GERD (gastroesophageal reflux disease)    History of kidney stones    Hyperlipidemia    Hypertension    Pneumonia    PONV (postoperative nausea and vomiting)    slow to wake up   Thickened endometrium 01/03/2015   Vaginal delivery 1976, 1985   Past Surgical History:  Procedure Laterality Date   ANKLE FRACTURE SURGERY     ATRIAL FIBRILLATION ABLATION N/A 02/08/2022   Procedure: ATRIAL FIBRILLATION ABLATION;  Surgeon: Lanier Prude, MD;  Location: [facility];  Service: Cardiovascular;  Laterality: N/A;   CARDIOVERSION N/A 02/17/2023   Procedure: CARDIOVERSION;  Surgeon: Thomasene Ripple, DO;  Location: [facility];  Service: Cardiovascular;  Laterality: N/A;   CATARACT EXTRACTION Right 2020   DILATION AND CURETTAGE OF UTERUS  2012   ablation    EYE SURGERY     Corneal growth removal on right eye   HYSTEROSCOPY WITH D & C N/A 01/03/2015   Procedure: DILATATION AND CURETTAGE /HYSTEROSCOPY;  Surgeon: Shea Evans, MD;  Location: [facility];  Service: Gynecology;  Laterality: N/A;   LAPAROSCOPY     for endometriosis   ROBOTIC ASSISTED TOTAL HYSTERECTOMY WITH BILATERAL SALPINGO OOPHERECTOMY N/A 03/17/2021   Procedure: XI ROBOTIC ASSISTED TOTAL HYSTERECTOMY WITH BILATERAL SALPINGO OOPHORECTOMY;  Surgeon:  Carver Fila, MD;  Location: [facility];  Service: Gynecology;  Laterality: N/A;   SENTINEL NODE BIOPSY N/A 03/17/2021   Procedure: SENTINEL NODE BIOPSY;  Surgeon: Carver Fila, MD;  Location: [facility];  Service: Gynecology;  Laterality: N/A;   TONSILLECTOMY AND ADENOIDECTOMY     Patient Active Problem List   Diagnosis Date Noted   Coronary artery disease involving native coronary artery of native heart without angina pectoris 03/28/2023   Hypercoagulable state due to paroxysmal atrial fibrillation (HCC) 03/02/2023   Persistent atrial fibrillation (HCC) 03/02/2023   Endometrial adenocarcinoma (HCC) 03/20/2021   Complex endometrial hyperplasia with atypia 02/18/2021   PAF (paroxysmal atrial fibrillation) (HCC) 10/15/2020   Elevated coronary artery calcium score 09/14/2020   Essential hypertension 09/14/2020   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 06/29/2018   Greater trochanteric bursitis of left hip 04/18/2018   Acute left lumbar radiculopathy 03/21/2018   Iliotibial band tendinitis of right side 09/30/2016   Trigger finger, right middle finger 09/30/2016   Thickened endometrium 01/03/2015   Endometrial hyperplasia 01/03/2015   Tibialis posterior tendonitis 05/09/2014   H/O ankle fusion 05/09/2014   Left leg pain 04/17/2014   Lipid disorder 11/14/2012   GERD 09/11/2010   CONSTIPATION 09/11/2010   DIARRHEA 09/11/2010    PCP: Alysia Penna, MD  REFERRING PROVIDER: Val Eagle  D, NP  REFERRING DIAG: S32.010A (ICD-10-CM) - Wedge compression fracture of first lumbar vertebra (HCC)  Rationale for Evaluation and Treatment: Rehabilitation  THERAPY DIAG:  Other low back pain  Cramp and spasm  Muscle weakness (generalized)  ONSET DATE: s/p kyphoplasty on 09/05/2023  SUBJECTIVE:                                                                                                                                                                                            SUBJECTIVE STATEMENT: Patient denies pain.  However, she states that she is not ready to discharge from PT yet, as she feels that she just started feeling better and does not want to impede her progress.  PERTINENT HISTORY:  Right Ankle Fusion, s/p kyphoplasty on 09/05/2023, osteopenia   PAIN:  Are you having pain? Yes: NPRS scale: 0-1/10 Pain location: right flank area of low back Pain description: soreness Aggravating factors: walking, reaching Relieving factors: sitting  PRECAUTIONS: None  RED FLAGS: Compression fracture: Yes: s/p kyphoplasty    WEIGHT BEARING RESTRICTIONS: No  FALLS:  Has patient fallen in last 6 months? No  LIVING ENVIRONMENT: Lives with: lives alone Lives in: House/apartment Stairs:  one home Has following equipment at home: Grab bars  OCCUPATION: Retired  PLOF: Independent and Leisure: gardening, reading, walking her dog, socializing with friends  PATIENT GOALS: To be stronger to be able to return to gardening and other desired tasks.  NEXT MD VISIT: As Needed  OBJECTIVE:  Note: Objective measures were completed at Evaluation unless otherwise noted.  DIAGNOSTIC FINDINGS:  Lumbar CT on 08/19/2023: IMPRESSION: 1. Redemonstrated subacute appearing compression fractures of the L1 and L2 superior endplates with slight interval progression of height loss at L1, now moderate. Unchanged moderate height loss at L2. 2. Mild spinal canal stenosis at T12-L1, L1-L2, and L3-L4. 3. Moderate spinal canal stenosis at L4-L5. 4. Cholelithiasis.  PATIENT SURVEYS:  Eval:  FOTO 51 (projected 80 by visit 12) 12/28/2023:  FOTO 61 (goal met)  COGNITION: Overall cognitive status: Within functional limits for tasks assessed     SENSATION: Patient denies and numbness or tingling  MUSCLE LENGTH: Hamstrings: Tightness bilaterally  POSTURE: rounded shoulders, forward head, and flexed trunk    LUMBAR ROM:   Eval:  Patient with painful lumbar ROM,  limited at least 50%  LOWER EXTREMITY ROM:     WFL  LOWER EXTREMITY MMT:    Eval:   Right LE strength is grossly WFL Left hip strength is 4/5  LUMBAR SPECIAL TESTS:  Slump test: Negative  FUNCTIONAL TESTS:  Eval: 5 times sit to stand: 15.07 sec 3 minute  walk test: 560 ft with slight increase in pain in right flank area  12/26/2023: 3 minute walk test: 591 ft  01/02/2024: 5 times sit to stand:  10.16 sec 3 minute walk test:  694 ft  2024/01/18: 6 minute walk test:  1226 ft with RPE of 2009-02-09 (age related norms are 1,304 ft)  GAIT: Distance walked: >500 ft Assistive device utilized: None Level of assistance: Complete Independence Comments: Patient with antalgic gait pattern with decreased step length and Trendelenburg gait  TODAY'S TREATMENT  DATE: 01/18/2024 Nustep level 5 x4 min with PT present to discuss status Seated hamstring stretch 2x20 sec bilat Seated piriformis stretch 2x20 sec bilat Seated modified dead lift with 5# kettle bell 2x10 Farmers carry with 5# kettlebell with marching x10 with weight in each hand Tandem walking on AirEx beam in parallel bars down and back x3 laps Side stepping on AirEx beam in parallel bars down and back x3 laps High marching on trampoline x1 min Standing row with green tband 2x10 Standing shoulder extension with green tband 2x10 Standing paloff press with green tband x10 bilat Supine 90/90 heel taps 2x10 Supine isometric dead bug x20 sec   DATE: 01/18/2024 Nustep level 5 x4 min with PT present to discuss status Seated hamstring stretch 2x20 sec bilat Seated piriformis stretch 2x20 sec bilat 6 minute walk test:  1226 ft with RPE of 02/09/2009 Seated modified dead lift with 5# kettle bell 2x10 Farmers carry with 5# kettlebell with marching x10 with weight in each hand Standing row with green tband 2x10 Standing shoulder extension with green tband 2x10 Standing paloff press with green tband x10 bilat Seated blue pball rollout  x10 Tandem stance x15 sec bilat   DATE: 01/09/2024 Nustep level 5 x6 min with PT present to discuss status Seated hamstring stretch 2x20 sec bilat Seated piriformis stretch 2x20 sec bilat Seated modified dead lift with 5# kettle bell 2x10 Farmers carry with 5# kettlebell with marching x10 with weight in each hand Hip Matrix 25# for hip abduction and hip extension x7 each bilat Farmer's carry down hallway to PT gym and back with 8# dumbbell x2 laps each hand Standing row with green tband 2x10 Standing shoulder extension with green tband 2x10     PATIENT EDUCATION:  Education details: Issued HEP and provided handout and education on dry needling Person educated: Patient Education method: Explanation, Demonstration, and Handouts Education comprehension: verbalized understanding and returned demonstration  HOME EXERCISE PROGRAM: Access Code: [email or web address] URL: [email or web address] Date: 11/21/2023 Prepared by: Clydie Braun Efrat Zuidema  Exercises - Seated Hamstring Stretch  - 1 x daily - 7 x weekly - 2 reps - 20 sec hold - Seated Piriformis Stretch with Trunk Bend  - 1 x daily - 7 x weekly - 2 reps - 20 sec hold - Seated Transversus Abdominis Bracing  - 1 x daily - 7 x weekly - 2 sets - 10 reps - Standing 'L' Stretch at Counter  - 1 x daily - 7 x weekly - 2 reps - 20 sec  hold - Seated Long Arc Quad  - 1 x daily - 7 x weekly - 2 sets - 10 reps - Sit to Stand Without Arm Support  - 1 x daily - 7 x weekly - 5 sets - 5-10 reps - Supine Lower Trunk Rotation  - 1 x daily - 7 x weekly - 10 reps - Supine March with Posterior Pelvic Tilt  - 1 x daily - 7 x weekly - 2 sets - 10  reps - Isometric Dead Bug  - 1 x daily - 7 x weekly - 5 reps - 10 sec hold  ASSESSMENT:  CLINICAL IMPRESSION: Ms Brandstetter presents to skilled PT with reports of only occasional minimal pain, but she feels that she continues to be making progress.  Patient able to progress with balance exercises today with upper  extremity support, as needed.  Patient without complaints during session, but did report some fatigue, requiring brief seated recovery periods.  Patient able to progress supine transversus abdominus contractions.  Patient continues to require skilled PT towards goal related activities.  OBJECTIVE IMPAIRMENTS: decreased balance, difficulty walking, decreased strength, increased muscle spasms, impaired flexibility, postural dysfunction, and pain.   ACTIVITY LIMITATIONS: carrying, lifting, bending, standing, and squatting  PARTICIPATION LIMITATIONS: cleaning, shopping, community activity, and yard work  PERSONAL FACTORS: Past/current experiences and 3+ comorbidities: s/p lumbar kyphoplasty, right ankle fusion, osteopenia  are also affecting patient's functional outcome.   REHAB POTENTIAL: Good  CLINICAL DECISION MAKING: Stable/uncomplicated  EVALUATION COMPLEXITY: Low   GOALS: Goals reviewed with patient? Yes  SHORT TERM GOALS: Target date: 12/09/2023  Patient will be independent with initial HEP. Baseline: Goal status: Met on 11/23/23  2.  Patient will report at least a 25% improvement in symptoms since starting PT. Baseline:  Goal status: Met on 12/07/2023   LONG TERM GOALS: Target date: 03/09/2024  Patient will be independent with advanced HEP to allow for self progression after discharge. Baseline:  Goal status: Ongoing  2.  Patient will increase LE and trunk functional strength to [facility] to allow her to lift buckets of dirt for gardening in the Spring. Baseline:  Goal status: Ongoing  3.  Patient will report ability to return to walking her dog daily on previous route without increased pain. Baseline:  Goal status: Ongoing  4.  Patient will increase FOTO to at least 61 to demonstrate improved functional mobility. Baseline: 51 Goal status: Met on 12/28/23  5.  Patient to report ability to return to going out to dinner or other outings with her friends without increased pain or  muscle spasms.  Baseline:   Goal status:  Ongoing    PLAN:  PT FREQUENCY: 1x/week  PT DURATION: 8 weeks  PLANNED INTERVENTIONS: 97164- PT Re-evaluation, 97110-Therapeutic exercises, 97530- Therapeutic activity, [id number]- Neuromuscular re-education, 97535- Self Care, [id number]- Manual therapy, [phone]- Gait training, [phone]- Canalith repositioning, [id number]- Aquatic Therapy, 97014- Electrical stimulation (unattended), [id number]- Electrical stimulation (manual), [id number]- Ultrasound, Patient/Family education, Balance training, Stair training, Taping, Dry Needling, Joint mobilization, Joint manipulation, Spinal manipulation, Spinal mobilization, Scar mobilization, Vestibular training, Cryotherapy, and Moist heat.  PLAN FOR NEXT SESSION: Assess and progress HEP as indicated, strengthening, core stability, flexibility, manual/dry needling as indicated   Reather Laurence, PT, DPT 01/18/24, 1:30 PM  [facility] [address], [address] [place], [place] [address] Phone # [phone] Fax [phone]

## 2024-01-25 ENCOUNTER — Ambulatory Visit: Admitting: Rehabilitative and Restorative Service Providers"

## 2024-01-25 ENCOUNTER — Encounter: Payer: Self-pay | Admitting: Rehabilitative and Restorative Service Providers"

## 2024-01-25 DIAGNOSIS — R252 Cramp and spasm: Secondary | ICD-10-CM | POA: Diagnosis not present

## 2024-01-25 DIAGNOSIS — M6281 Muscle weakness (generalized): Secondary | ICD-10-CM

## 2024-01-25 DIAGNOSIS — M5459 Other low back pain: Secondary | ICD-10-CM | POA: Diagnosis not present

## 2024-01-25 NOTE — Therapy (Signed)
 OUTPATIENT PHYSICAL THERAPY TREATMENT NOTE   Patient Name: Mary Potter MRN: [id number] DOB:20-Mar-1946, 78 y.o., female Today's Date: 01/25/2024    END OF SESSION:  PT End of Session - 01/25/24 1231     Visit Number 15    Date for PT Re-Evaluation 03/09/24    Authorization Type Medicare    Progress Note Due on Visit 23    PT Start Time 1228    PT Stop Time 1310    PT Time Calculation (min) 42 min    Activity Tolerance Patient tolerated treatment well    Behavior During Therapy [facility] for tasks assessed/performed               Past Medical History:  Diagnosis Date   Atrial fibrillation (HCC)    Dysrhythmia    A-fib   Endometrial hyperplasia 01/03/2015   GERD (gastroesophageal reflux disease)    History of kidney stones    Hyperlipidemia    Hypertension    Pneumonia    PONV (postoperative nausea and vomiting)    slow to wake up   Thickened endometrium 01/03/2015   Vaginal delivery 1976, 1985   Past Surgical History:  Procedure Laterality Date   ANKLE FRACTURE SURGERY     ATRIAL FIBRILLATION ABLATION N/A 02/08/2022   Procedure: ATRIAL FIBRILLATION ABLATION;  Surgeon: Lanier Prude, MD;  Location: [facility];  Service: Cardiovascular;  Laterality: N/A;   CARDIOVERSION N/A 02/17/2023   Procedure: CARDIOVERSION;  Surgeon: Thomasene Ripple, DO;  Location: [facility];  Service: Cardiovascular;  Laterality: N/A;   CATARACT EXTRACTION Right 2020   DILATION AND CURETTAGE OF UTERUS  2012   ablation    EYE SURGERY     Corneal growth removal on right eye   HYSTEROSCOPY WITH D & C N/A 01/03/2015   Procedure: DILATATION AND CURETTAGE /HYSTEROSCOPY;  Surgeon: Shea Evans, MD;  Location: [facility];  Service: Gynecology;  Laterality: N/A;   LAPAROSCOPY     for endometriosis   ROBOTIC ASSISTED TOTAL HYSTERECTOMY WITH BILATERAL SALPINGO OOPHERECTOMY N/A 03/17/2021   Procedure: XI ROBOTIC ASSISTED TOTAL HYSTERECTOMY WITH BILATERAL SALPINGO OOPHORECTOMY;  Surgeon:  Carver Fila, MD;  Location: [facility];  Service: Gynecology;  Laterality: N/A;   SENTINEL NODE BIOPSY N/A 03/17/2021   Procedure: SENTINEL NODE BIOPSY;  Surgeon: Carver Fila, MD;  Location: [facility];  Service: Gynecology;  Laterality: N/A;   TONSILLECTOMY AND ADENOIDECTOMY     Patient Active Problem List   Diagnosis Date Noted   Coronary artery disease involving native coronary artery of native heart without angina pectoris 03/28/2023   Hypercoagulable state due to paroxysmal atrial fibrillation (HCC) 03/02/2023   Persistent atrial fibrillation (HCC) 03/02/2023   Endometrial adenocarcinoma (HCC) 03/20/2021   Complex endometrial hyperplasia with atypia 02/18/2021   PAF (paroxysmal atrial fibrillation) (HCC) 10/15/2020   Elevated coronary artery calcium score 09/14/2020   Essential hypertension 09/14/2020   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 06/29/2018   Greater trochanteric bursitis of left hip 04/18/2018   Acute left lumbar radiculopathy 03/21/2018   Iliotibial band tendinitis of right side 09/30/2016   Trigger finger, right middle finger 09/30/2016   Thickened endometrium 01/03/2015   Endometrial hyperplasia 01/03/2015   Tibialis posterior tendonitis 05/09/2014   H/O ankle fusion 05/09/2014   Left leg pain 04/17/2014   Lipid disorder 11/14/2012   GERD 09/11/2010   CONSTIPATION 09/11/2010   DIARRHEA 09/11/2010    PCP: Alysia Penna, MD  REFERRING PROVIDER: Val Eagle  D, NP  REFERRING DIAG: S32.010A (ICD-10-CM) - Wedge compression fracture of first lumbar vertebra (HCC)  Rationale for Evaluation and Treatment: Rehabilitation  THERAPY DIAG:  Other low back pain  Cramp and spasm  Muscle weakness (generalized)  ONSET DATE: s/p kyphoplasty on 09/05/2023  SUBJECTIVE:                                                                                                                                                                                            SUBJECTIVE STATEMENT: Patient states that she is a little sore after walking her dog for a longer walk yesterday, but she is doing well.  PERTINENT HISTORY:  Right Ankle Fusion, s/p kyphoplasty on 09/05/2023, osteopenia   PAIN:  Are you having pain? Yes: NPRS scale: 0-1/10 Pain location: right flank area of low back Pain description: soreness Aggravating factors: walking, reaching Relieving factors: sitting  PRECAUTIONS: None  RED FLAGS: Compression fracture: Yes: s/p kyphoplasty    WEIGHT BEARING RESTRICTIONS: No  FALLS:  Has patient fallen in last 6 months? No  LIVING ENVIRONMENT: Lives with: lives alone Lives in: House/apartment Stairs:  one home Has following equipment at home: Grab bars  OCCUPATION: Retired  PLOF: Independent and Leisure: gardening, reading, walking her dog, socializing with friends  PATIENT GOALS: To be stronger to be able to return to gardening and other desired tasks.  NEXT MD VISIT: As Needed  OBJECTIVE:  Note: Objective measures were completed at Evaluation unless otherwise noted.  DIAGNOSTIC FINDINGS:  Lumbar CT on 08/19/2023: IMPRESSION: 1. Redemonstrated subacute appearing compression fractures of the L1 and L2 superior endplates with slight interval progression of height loss at L1, now moderate. Unchanged moderate height loss at L2. 2. Mild spinal canal stenosis at T12-L1, L1-L2, and L3-L4. 3. Moderate spinal canal stenosis at L4-L5. 4. Cholelithiasis.  PATIENT SURVEYS:  Eval:  FOTO 51 (projected 28 by visit 12) 12/28/2023:  FOTO 61 (goal met)  COGNITION: Overall cognitive status: Within functional limits for tasks assessed     SENSATION: Patient denies and numbness or tingling  MUSCLE LENGTH: Hamstrings: Tightness bilaterally  POSTURE: rounded shoulders, forward head, and flexed trunk    LUMBAR ROM:   Eval:  Patient with painful lumbar ROM, limited at least 50%  LOWER EXTREMITY ROM:     WFL  LOWER  EXTREMITY MMT:    Eval:   Right LE strength is grossly WFL Left hip strength is 4/5  LUMBAR SPECIAL TESTS:  Slump test: Negative  FUNCTIONAL TESTS:  Eval: 5 times sit to stand: 15.07 sec 3 minute walk test: 560 ft with slight increase in pain in right flank  area  12/26/2023: 3 minute walk test: 591 ft  01/02/2024: 5 times sit to stand:  10.16 sec 3 minute walk test:  694 ft  January 31, 2024: 6 minute walk test:  1226 ft with RPE of 2009/02/22 (age related norms are 1,304 ft)  GAIT: Distance walked: >500 ft Assistive device utilized: None Level of assistance: Complete Independence Comments: Patient with antalgic gait pattern with decreased step length and Trendelenburg gait  TODAY'S TREATMENT  DATE: 01/25/2024 Nustep level 6 x6 min with PT present to discuss status Seated hamstring stretch 2x20 sec bilat Seated piriformis stretch 2x20 sec bilat Hip Matrix 25# for hip abduction and hip extension x7 each bilat Tandem walking on AirEx beam in parallel bars down and back x3 laps Side stepping on AirEx beam in parallel bars down and back x3 laps High marching on trampoline x2 min Standing row with green tband 2x10 Standing shoulder extension with green tband 2x10 Standing paloff press with green tband x10 bilat Supine isometric dead bug 2x20 sec   DATE: 02-06-24 Nustep level 5 x4 min with PT present to discuss status Seated hamstring stretch 2x20 sec bilat Seated piriformis stretch 2x20 sec bilat Seated modified dead lift with 5# kettle bell 2x10 Farmers carry with 5# kettlebell with marching x10 with weight in each hand Tandem walking on AirEx beam in parallel bars down and back x3 laps Side stepping on AirEx beam in parallel bars down and back x3 laps High marching on trampoline x1 min Standing row with green tband 2x10 Standing shoulder extension with green tband 2x10 Standing paloff press with green tband x10 bilat Supine 90/90 heel taps 2x10 Supine isometric dead bug x20  sec   DATE: 2024-01-31 Nustep level 5 x4 min with PT present to discuss status Seated hamstring stretch 2x20 sec bilat Seated piriformis stretch 2x20 sec bilat 6 minute walk test:  1226 ft with RPE of 02/22/09 Seated modified dead lift with 5# kettle bell 2x10 Farmers carry with 5# kettlebell with marching x10 with weight in each hand Standing row with green tband 2x10 Standing shoulder extension with green tband 2x10 Standing paloff press with green tband x10 bilat Seated blue pball rollout x10 Tandem stance x15 sec bilat     PATIENT EDUCATION:  Education details: Issued HEP and provided handout and education on dry needling Person educated: Patient Education method: Programmer, multimedia, Facilities manager, and Handouts Education comprehension: verbalized understanding and returned demonstration  HOME EXERCISE PROGRAM: Access Code: [email or web address] URL: [email or web address] Date: 11/21/2023 Prepared by: Clydie Braun Alonzo Owczarzak  Exercises - Seated Hamstring Stretch  - 1 x daily - 7 x weekly - 2 reps - 20 sec hold - Seated Piriformis Stretch with Trunk Bend  - 1 x daily - 7 x weekly - 2 reps - 20 sec hold - Seated Transversus Abdominis Bracing  - 1 x daily - 7 x weekly - 2 sets - 10 reps - Standing 'L' Stretch at Counter  - 1 x daily - 7 x weekly - 2 reps - 20 sec  hold - Seated Long Arc Quad  - 1 x daily - 7 x weekly - 2 sets - 10 reps - Sit to Stand Without Arm Support  - 1 x daily - 7 x weekly - 5 sets - 5-10 reps - Supine Lower Trunk Rotation  - 1 x daily - 7 x weekly - 10 reps - Supine March with Posterior Pelvic Tilt  - 1 x daily - 7 x weekly - 2 sets - 10 reps - Isometric Dead  Bug  - 1 x daily - 7 x weekly - 5 reps - 10 sec hold  ASSESSMENT:  CLINICAL IMPRESSION: Ms Equihua presents to skilled PT stating that she is trying to walk her dog longer routes and noted some increased pain.  Patient able to tolerate strengthening on hip matrix machine.  Patient continues with core  strengthening exercises with isometric dead bug and paloff press.  Patient encouraged to continue to increase her functional tasks at home, as able/tolerated.  OBJECTIVE IMPAIRMENTS: decreased balance, difficulty walking, decreased strength, increased muscle spasms, impaired flexibility, postural dysfunction, and pain.   ACTIVITY LIMITATIONS: carrying, lifting, bending, standing, and squatting  PARTICIPATION LIMITATIONS: cleaning, shopping, community activity, and yard work  PERSONAL FACTORS: Past/current experiences and 3+ comorbidities: s/p lumbar kyphoplasty, right ankle fusion, osteopenia  are also affecting patient's functional outcome.   REHAB POTENTIAL: Good  CLINICAL DECISION MAKING: Stable/uncomplicated  EVALUATION COMPLEXITY: Low   GOALS: Goals reviewed with patient? Yes  SHORT TERM GOALS: Target date: 12/09/2023  Patient will be independent with initial HEP. Baseline: Goal status: Met on 11/23/23  2.  Patient will report at least a 25% improvement in symptoms since starting PT. Baseline:  Goal status: Met on 12/07/2023   LONG TERM GOALS: Target date: 03/09/2024  Patient will be independent with advanced HEP to allow for self progression after discharge. Baseline:  Goal status: Ongoing  2.  Patient will increase LE and trunk functional strength to [facility] to allow her to lift buckets of dirt for gardening in the Spring. Baseline:  Goal status: Ongoing  3.  Patient will report ability to return to walking her dog daily on previous route without increased pain. Baseline:  Goal status: Ongoing  4.  Patient will increase FOTO to at least 61 to demonstrate improved functional mobility. Baseline: 51 Goal status: Met on 12/28/23  5.  Patient to report ability to return to going out to dinner or other outings with her friends without increased pain or muscle spasms.  Baseline:   Goal status:  Ongoing    PLAN:  PT FREQUENCY: 1x/week  PT DURATION: 8 weeks  PLANNED  INTERVENTIONS: 97164- PT Re-evaluation, 97110-Therapeutic exercises, 97530- Therapeutic activity, [id number]- Neuromuscular re-education, 97535- Self Care, [id number]- Manual therapy, [phone]- Gait training, [phone]- Canalith repositioning, [id number]- Aquatic Therapy, 97014- Electrical stimulation (unattended), [id number]- Electrical stimulation (manual), [id number]- Ultrasound, Patient/Family education, Balance training, Stair training, Taping, Dry Needling, Joint mobilization, Joint manipulation, Spinal manipulation, Spinal mobilization, Scar mobilization, Vestibular training, Cryotherapy, and Moist heat.  PLAN FOR NEXT SESSION: Assess and progress HEP as indicated, strengthening, core stability, flexibility, manual/dry needling as indicated   Reather Laurence, PT, DPT 01/25/24, 1:27 PM  [facility] [address] [place], [place] [address] Phone # [phone] Fax [phone]

## 2024-01-31 NOTE — Therapy (Signed)
 OUTPATIENT PHYSICAL THERAPY TREATMENT NOTE   Patient Name: Mary Potter MRN: [id number] DOB:August 27, 1946, 78 y.o., female Today's Date: 02/01/2024    END OF SESSION:  PT End of Session - 02/01/24 1235     Visit Number 16    Date for PT Re-Evaluation 03/09/24    Authorization Type Medicare    Progress Note Due on Visit 23    PT Start Time 1233    PT Stop Time 1317    PT Time Calculation (min) 44 min    Activity Tolerance Patient tolerated treatment well    Behavior During Therapy [facility] for tasks assessed/performed                Past Medical History:  Diagnosis Date   Atrial fibrillation (HCC)    Dysrhythmia    A-fib   Endometrial hyperplasia 01/03/2015   GERD (gastroesophageal reflux disease)    History of kidney stones    Hyperlipidemia    Hypertension    Pneumonia    PONV (postoperative nausea and vomiting)    slow to wake up   Thickened endometrium 01/03/2015   Vaginal delivery 1976, 1985   Past Surgical History:  Procedure Laterality Date   ANKLE FRACTURE SURGERY     ATRIAL FIBRILLATION ABLATION N/A 02/08/2022   Procedure: ATRIAL FIBRILLATION ABLATION;  Surgeon: Lanier Prude, MD;  Location: [facility];  Service: Cardiovascular;  Laterality: N/A;   CARDIOVERSION N/A 02/17/2023   Procedure: CARDIOVERSION;  Surgeon: Thomasene Ripple, DO;  Location: [facility];  Service: Cardiovascular;  Laterality: N/A;   CATARACT EXTRACTION Right 2020   DILATION AND CURETTAGE OF UTERUS  2012   ablation    EYE SURGERY     Corneal growth removal on right eye   HYSTEROSCOPY WITH D & C N/A 01/03/2015   Procedure: DILATATION AND CURETTAGE /HYSTEROSCOPY;  Surgeon: Shea Evans, MD;  Location: [facility];  Service: Gynecology;  Laterality: N/A;   LAPAROSCOPY     for endometriosis   ROBOTIC ASSISTED TOTAL HYSTERECTOMY WITH BILATERAL SALPINGO OOPHERECTOMY N/A 03/17/2021   Procedure: XI ROBOTIC ASSISTED TOTAL HYSTERECTOMY WITH BILATERAL SALPINGO OOPHORECTOMY;  Surgeon:  Carver Fila, MD;  Location: [facility];  Service: Gynecology;  Laterality: N/A;   SENTINEL NODE BIOPSY N/A 03/17/2021   Procedure: SENTINEL NODE BIOPSY;  Surgeon: Carver Fila, MD;  Location: [facility];  Service: Gynecology;  Laterality: N/A;   TONSILLECTOMY AND ADENOIDECTOMY     Patient Active Problem List   Diagnosis Date Noted   Coronary artery disease involving native coronary artery of native heart without angina pectoris 03/28/2023   Hypercoagulable state due to paroxysmal atrial fibrillation (HCC) 03/02/2023   Persistent atrial fibrillation (HCC) 03/02/2023   Endometrial adenocarcinoma (HCC) 03/20/2021   Complex endometrial hyperplasia with atypia 02/18/2021   PAF (paroxysmal atrial fibrillation) (HCC) 10/15/2020   Elevated coronary artery calcium score 09/14/2020   Essential hypertension 09/14/2020   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 06/29/2018   Greater trochanteric bursitis of left hip 04/18/2018   Acute left lumbar radiculopathy 03/21/2018   Iliotibial band tendinitis of right side 09/30/2016   Trigger finger, right middle finger 09/30/2016   Thickened endometrium 01/03/2015   Endometrial hyperplasia 01/03/2015   Tibialis posterior tendonitis 05/09/2014   H/O ankle fusion 05/09/2014   Left leg pain 04/17/2014   Lipid disorder 11/14/2012   GERD 09/11/2010   CONSTIPATION 09/11/2010   DIARRHEA 09/11/2010    PCP: Alysia Penna, MD  REFERRING PROVIDER: Doran Durand,  Meghan D, NP  REFERRING DIAG: S32.010A (ICD-10-CM) - Wedge compression fracture of first lumbar vertebra (HCC)  Rationale for Evaluation and Treatment: Rehabilitation  THERAPY DIAG:  Other low back pain  Cramp and spasm  Muscle weakness (generalized)  ONSET DATE: s/p kyphoplasty on 09/05/2023  SUBJECTIVE:                                                                                                                                                                                            SUBJECTIVE STATEMENT: Walked dog about 20 minutes yesterday. No pain except when he pulls. Trimmed hedges this weekend and some yesterday. Sore but not in pain.  PERTINENT HISTORY:  Right Ankle Fusion, s/p kyphoplasty on 09/05/2023, osteopenia   PAIN:  Are you having pain? Yes: NPRS scale: 0-1/10 Pain location: right flank area of low back Pain description: soreness Aggravating factors: walking, reaching Relieving factors: sitting  PRECAUTIONS: None  RED FLAGS: Compression fracture: Yes: s/p kyphoplasty    WEIGHT BEARING RESTRICTIONS: No  FALLS:  Has patient fallen in last 6 months? No  LIVING ENVIRONMENT: Lives with: lives alone Lives in: House/apartment Stairs:  one home Has following equipment at home: Grab bars  OCCUPATION: Retired  PLOF: Independent and Leisure: gardening, reading, walking her dog, socializing with friends  PATIENT GOALS: To be stronger to be able to return to gardening and other desired tasks.  NEXT MD VISIT: As Needed  OBJECTIVE:  Note: Objective measures were completed at Evaluation unless otherwise noted.  DIAGNOSTIC FINDINGS:  Lumbar CT on 08/19/2023: IMPRESSION: 1. Redemonstrated subacute appearing compression fractures of the L1 and L2 superior endplates with slight interval progression of height loss at L1, now moderate. Unchanged moderate height loss at L2. 2. Mild spinal canal stenosis at T12-L1, L1-L2, and L3-L4. 3. Moderate spinal canal stenosis at L4-L5. 4. Cholelithiasis.  PATIENT SURVEYS:  Eval:  FOTO 51 (projected 89 by visit 12) 12/28/2023:  FOTO 61 (goal met)  COGNITION: Overall cognitive status: Within functional limits for tasks assessed     SENSATION: Patient denies and numbness or tingling  MUSCLE LENGTH: Hamstrings: Tightness bilaterally  POSTURE: rounded shoulders, forward head, and flexed trunk    LUMBAR ROM:   Eval:  Patient with painful lumbar ROM, limited at least 50%  LOWER EXTREMITY  ROM:     WFL  LOWER EXTREMITY MMT:    Eval:   Right LE strength is grossly WFL Left hip strength is 4/5  LUMBAR SPECIAL TESTS:  Slump test: Negative  FUNCTIONAL TESTS:  Eval: 5 times sit to stand: 15.07 sec 3 minute walk test: 560 ft with slight increase in pain  in right flank area  12/26/2023: 3 minute walk test: 591 ft  01/02/2024: 5 times sit to stand:  10.16 sec 3 minute walk test:  694 ft  01/12/2024: 6 minute walk test:  1226 ft with RPE of 4-5/10 (age related norms are 1,304 ft)  GAIT: Distance walked: >500 ft Assistive device utilized: None Level of assistance: Complete Independence Comments: Patient with antalgic gait pattern with decreased step length and Trendelenburg gait  TODAY'S TREATMENT  DATE: 02/01/2024 Nustep level 6 x6 min with PT present to discuss status Hip Matrix 25# for hip abduction, hip extension, and hip flex (march)  1 x 10 each bilat Standing row with green tband tandem stance 2x12 cues for chest out to avoid thoracic kyphosis Standing shoulder extension with SLS with toe touch green tband 2x12 Standing paloff press with green tband x10 bilat Supine isometric dead bug 2x20 sec Supine 90/90 heel taps 2x10 Tandem walking on AirEx beam in parallel bars down and back 2x3 laps Side stepping on AirEx beam in parallel bars down and back 2x3 laps  Manual Therapy: TPR to R QL in Left S/L Soft tissue mobilization to R QL/lumbar following DN to further promote tissue elongation and decreased pain.  Trigger Point Dry Needling  Subsequent Treatment: Instructions provided previously at initial dry needling treatment.   Patient Verbal Consent Given: Yes Education Handout Provided: Previously Provided Muscles Treated: R QL and lumbar paraspinals Electrical Stimulation Performed: No Treatment Response/Outcome: Utilized skilled palpation to identify bony landmarks and trigger points.  Able to illicit twitch response and muscle elongation.          DATE: 01/25/2024 Nustep level 6 x6 min with PT present to discuss status Seated hamstring stretch 2x20 sec bilat Seated piriformis stretch 2x20 sec bilat Hip Matrix 25# for hip abduction and hip extension x7 each bilat Tandem walking on AirEx beam in parallel bars down and back x3 laps Side stepping on AirEx beam in parallel bars down and back x3 laps High marching on trampoline x2 min Standing row with green tband 2x10 Standing shoulder extension with green tband 2x10 Standing paloff press with green tband x10 bilat Supine isometric dead bug 2x20 sec   DATE: 2024/02/04 Nustep level 5 x4 min with PT present to discuss status Seated hamstring stretch 2x20 sec bilat Seated piriformis stretch 2x20 sec bilat Seated modified dead lift with 5# kettle bell 2x10 Farmers carry with 5# kettlebell with marching x10 with weight in each hand Tandem walking on AirEx beam in parallel bars down and back x3 laps Side stepping on AirEx beam in parallel bars down and back x3 laps High marching on trampoline x1 min Standing row with green tband 2x10 Standing shoulder extension with green tband 2x10 Standing paloff press with green tband x10 bilat Supine 90/90 heel taps 2x10 Supine isometric dead bug x20 sec   PATIENT EDUCATION:  Education details: Issued HEP and provided handout and education on dry needling Person educated: Patient Education method: Explanation, Demonstration, and Handouts Education comprehension: verbalized understanding and returned demonstration  HOME EXERCISE PROGRAM: Access Code: [email or web address] URL: [email or web address] Date: 11/21/2023 Prepared by: Clydie Braun Menke  Exercises - Seated Hamstring Stretch  - 1 x daily - 7 x weekly - 2 reps - 20 sec hold - Seated Piriformis Stretch with Trunk Bend  - 1 x daily - 7 x weekly - 2 reps - 20 sec hold - Seated Transversus Abdominis Bracing  - 1 x daily - 7 x weekly - 2 sets - 10 reps -  Standing 'L' Stretch at  Counter  - 1 x daily - 7 x weekly - 2 reps - 20 sec  hold - Seated Long Arc Quad  - 1 x daily - 7 x weekly - 2 sets - 10 reps - Sit to Stand Without Arm Support  - 1 x daily - 7 x weekly - 5 sets - 5-10 reps - Supine Lower Trunk Rotation  - 1 x daily - 7 x weekly - 10 reps - Supine March with Posterior Pelvic Tilt  - 1 x daily - 7 x weekly - 2 sets - 10 reps - Isometric Dead Bug  - 1 x daily - 7 x weekly - 5 reps - 10 sec hold  ASSESSMENT:  CLINICAL IMPRESSION: Patient able to progress exercises with balance components today. She was able to walk on the balance beam today without UE assist. Tandem walking required intermittent support. She had a spasm in the R QL which released with MT and DN. She would benefit from prone lumbar stab exercises and possible QL stretch next visit.   OBJECTIVE IMPAIRMENTS: decreased balance, difficulty walking, decreased strength, increased muscle spasms, impaired flexibility, postural dysfunction, and pain.   ACTIVITY LIMITATIONS: carrying, lifting, bending, standing, and squatting  PARTICIPATION LIMITATIONS: cleaning, shopping, community activity, and yard work  PERSONAL FACTORS: Past/current experiences and 3+ comorbidities: s/p lumbar kyphoplasty, right ankle fusion, osteopenia  are also affecting patient's functional outcome.   REHAB POTENTIAL: Good  CLINICAL DECISION MAKING: Stable/uncomplicated  EVALUATION COMPLEXITY: Low   GOALS: Goals reviewed with patient? Yes  SHORT TERM GOALS: Target date: 12/09/2023  Patient will be independent with initial HEP. Baseline: Goal status: Met on 11/23/23  2.  Patient will report at least a 25% improvement in symptoms since starting PT. Baseline:  Goal status: Met on 12/07/2023   LONG TERM GOALS: Target date: 03/09/2024  Patient will be independent with advanced HEP to allow for self progression after discharge. Baseline:  Goal status: Ongoing  2.  Patient will increase LE and trunk functional strength to  [facility] to allow her to lift buckets of dirt for gardening in the Spring. Baseline:  Goal status: Ongoing  3.  Patient will report ability to return to walking her dog daily on previous route without increased pain. Baseline:  Goal status: Ongoing  4.  Patient will increase FOTO to at least 61 to demonstrate improved functional mobility. Baseline: 51 Goal status: Met on 12/28/23  5.  Patient to report ability to return to going out to dinner or other outings with her friends without increased pain or muscle spasms.  Baseline:   Goal status:  Ongoing    PLAN:  PT FREQUENCY: 1x/week  PT DURATION: 8 weeks  PLANNED INTERVENTIONS: 97164- PT Re-evaluation, 97110-Therapeutic exercises, 97530- Therapeutic activity, [id number]- Neuromuscular re-education, 97535- Self Care, [id number]- Manual therapy, [phone]- Gait training, [phone]- Canalith repositioning, [id number]- Aquatic Therapy, 97014- Electrical stimulation (unattended), [id number]- Electrical stimulation (manual), [id number]- Ultrasound, Patient/Family education, Balance training, Stair training, Taping, Dry Needling, Joint mobilization, Joint manipulation, Spinal manipulation, Spinal mobilization, Scar mobilization, Vestibular training, Cryotherapy, and Moist heat.  PLAN FOR NEXT SESSION: Assess response to DN/MT. Try prone lumbar stab, add QL stretch. Progress HEP as indicated, strengthening, core stability, flexibility, manual/dry needling as indicated   Solon Palm, PT  02/01/24, 1:26 PM  [facility] [address] [place], [place] [address] Phone # [phone] Fax [phone]

## 2024-02-01 ENCOUNTER — Encounter: Payer: Self-pay | Admitting: Physical Therapy

## 2024-02-01 ENCOUNTER — Ambulatory Visit: Attending: Student | Admitting: Physical Therapy

## 2024-02-01 DIAGNOSIS — M5459 Other low back pain: Secondary | ICD-10-CM | POA: Insufficient documentation

## 2024-02-01 DIAGNOSIS — M6281 Muscle weakness (generalized): Secondary | ICD-10-CM | POA: Insufficient documentation

## 2024-02-01 DIAGNOSIS — R252 Cramp and spasm: Secondary | ICD-10-CM | POA: Diagnosis not present

## 2024-02-07 NOTE — Therapy (Signed)
 OUTPATIENT PHYSICAL THERAPY TREATMENT NOTE   Patient Name: Mary Potter MRN: [id number] DOB:22-Aug-1946, 78 y.o., female Today's Date: 02/08/2024    END OF SESSION:  PT End of Session - 02/08/24 1240     Visit Number 17    Date for PT Re-Evaluation 03/09/24    Authorization Type Medicare    Progress Note Due on Visit 23    PT Start Time 1235    PT Stop Time 1318    PT Time Calculation (min) 43 min    Activity Tolerance Patient tolerated treatment well    Behavior During Therapy [facility] for tasks assessed/performed                 Past Medical History:  Diagnosis Date   Atrial fibrillation (HCC)    Dysrhythmia    A-fib   Endometrial hyperplasia 01/03/2015   GERD (gastroesophageal reflux disease)    History of kidney stones    Hyperlipidemia    Hypertension    Pneumonia    PONV (postoperative nausea and vomiting)    slow to wake up   Thickened endometrium 01/03/2015   Vaginal delivery 1976, 1985   Past Surgical History:  Procedure Laterality Date   ANKLE FRACTURE SURGERY     ATRIAL FIBRILLATION ABLATION N/A 02/08/2022   Procedure: ATRIAL FIBRILLATION ABLATION;  Surgeon: Lanier Prude, MD;  Location: [facility];  Service: Cardiovascular;  Laterality: N/A;   CARDIOVERSION N/A 02/17/2023   Procedure: CARDIOVERSION;  Surgeon: Thomasene Ripple, DO;  Location: [facility];  Service: Cardiovascular;  Laterality: N/A;   CATARACT EXTRACTION Right 2020   DILATION AND CURETTAGE OF UTERUS  2012   ablation    EYE SURGERY     Corneal growth removal on right eye   HYSTEROSCOPY WITH D & C N/A 01/03/2015   Procedure: DILATATION AND CURETTAGE /HYSTEROSCOPY;  Surgeon: Shea Evans, MD;  Location: [facility];  Service: Gynecology;  Laterality: N/A;   LAPAROSCOPY     for endometriosis   ROBOTIC ASSISTED TOTAL HYSTERECTOMY WITH BILATERAL SALPINGO OOPHERECTOMY N/A 03/17/2021   Procedure: XI ROBOTIC ASSISTED TOTAL HYSTERECTOMY WITH BILATERAL SALPINGO OOPHORECTOMY;  Surgeon:  Carver Fila, MD;  Location: [facility];  Service: Gynecology;  Laterality: N/A;   SENTINEL NODE BIOPSY N/A 03/17/2021   Procedure: SENTINEL NODE BIOPSY;  Surgeon: Carver Fila, MD;  Location: [facility];  Service: Gynecology;  Laterality: N/A;   TONSILLECTOMY AND ADENOIDECTOMY     Patient Active Problem List   Diagnosis Date Noted   Coronary artery disease involving native coronary artery of native heart without angina pectoris 03/28/2023   Hypercoagulable state due to paroxysmal atrial fibrillation (HCC) 03/02/2023   Persistent atrial fibrillation (HCC) 03/02/2023   Endometrial adenocarcinoma (HCC) 03/20/2021   Complex endometrial hyperplasia with atypia 02/18/2021   PAF (paroxysmal atrial fibrillation) (HCC) 10/15/2020   Elevated coronary artery calcium score 09/14/2020   Essential hypertension 09/14/2020   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 06/29/2018   Greater trochanteric bursitis of left hip 04/18/2018   Acute left lumbar radiculopathy 03/21/2018   Iliotibial band tendinitis of right side 09/30/2016   Trigger finger, right middle finger 09/30/2016   Thickened endometrium 01/03/2015   Endometrial hyperplasia 01/03/2015   Tibialis posterior tendonitis 05/09/2014   H/O ankle fusion 05/09/2014   Left leg pain 04/17/2014   Lipid disorder 11/14/2012   GERD 09/11/2010   CONSTIPATION 09/11/2010   DIARRHEA 09/11/2010    PCP: Alysia Penna, MD  REFERRING PROVIDER:  Val Eagle D, NP  REFERRING DIAG: S32.010A (ICD-10-CM) - Wedge compression fracture of first lumbar vertebra [facility])  Rationale for Evaluation and Treatment: Rehabilitation  THERAPY DIAG:  Other low back pain  Muscle weakness (generalized)  Cramp and spasm  ONSET DATE: s/p kyphoplasty on 09/05/2023  SUBJECTIVE:                                                                                                                                                                                            SUBJECTIVE STATEMENT: My side has been the best it has been so far.   PERTINENT HISTORY:  Right Ankle Fusion, s/p kyphoplasty on 09/05/2023, osteopenia   PAIN:  Are you having pain? Yes: NPRS scale: 0/10 Pain location: right flank area of low back Pain description: soreness Aggravating factors: walking, reaching Relieving factors: sitting  PRECAUTIONS: None  RED FLAGS: Compression fracture: Yes: s/p kyphoplasty    WEIGHT BEARING RESTRICTIONS: No  FALLS:  Has patient fallen in last 6 months? No  LIVING ENVIRONMENT: Lives with: lives alone Lives in: House/apartment Stairs:  one home Has following equipment at home: Grab bars  OCCUPATION: Retired  PLOF: Independent and Leisure: gardening, reading, walking her dog, socializing with friends  PATIENT GOALS: To be stronger to be able to return to gardening and other desired tasks.  NEXT MD VISIT: As Needed  OBJECTIVE:  Note: Objective measures were completed at Evaluation unless otherwise noted.  DIAGNOSTIC FINDINGS:  Lumbar CT on 08/19/2023: IMPRESSION: 1. Redemonstrated subacute appearing compression fractures of the L1 and L2 superior endplates with slight interval progression of height loss at L1, now moderate. Unchanged moderate height loss at L2. 2. Mild spinal canal stenosis at T12-L1, L1-L2, and L3-L4. 3. Moderate spinal canal stenosis at L4-L5. 4. Cholelithiasis.  PATIENT SURVEYS:  Eval:  FOTO 51 (projected 89 by visit 12) 12/28/2023:  FOTO 61 (goal met)  COGNITION: Overall cognitive status: Within functional limits for tasks assessed     SENSATION: Patient denies and numbness or tingling  MUSCLE LENGTH: Hamstrings: Tightness bilaterally  POSTURE: rounded shoulders, forward head, and flexed trunk    LUMBAR ROM:   Eval:  Patient with painful lumbar ROM, limited at least 50%  LOWER EXTREMITY ROM:     WFL  LOWER EXTREMITY MMT:    Eval:   Right LE strength is grossly WFL Left hip  strength is 4/5  LUMBAR SPECIAL TESTS:  Slump test: Negative  FUNCTIONAL TESTS:  Eval: 5 times sit to stand: 15.07 sec 3 minute walk test: 560 ft with slight increase in pain in right flank area  12/26/2023: 3 minute walk test: 591  ft  01/02/2024: 5 times sit to stand:  10.16 sec 3 minute walk test:  694 ft  01/12/2024: 6 minute walk test:  1226 ft with RPE of 4-5/10 (age related norms are 1,304 ft)  GAIT: Distance walked: >500 ft Assistive device utilized: None Level of assistance: Complete Independence Comments: Patient with antalgic gait pattern with decreased step length and Trendelenburg gait  TODAY'S TREATMENT  DATE: 02/08/2024 Nustep level 5 x6 min with PT present to discuss status QL stretch in doorway x 30 sec - also tried others but this one is best prone pelvic press 5 sec hold x 5 Prone hip ext with press 2 x 10 B - use pillow under waist next time Quadruped too difficult Pelvic rock in hooklying x 10 LTR with wide feet x 5 B 10 sec hold Hip Matrix 25# for hip abduction, hip extension, and hip flex (march)  1 x 10 each bilat Standing row with green tband tandem stance 2x12 cues for chest out to avoid thoracic kyphosis Standing shoulder extension with SLS with toe touch green tband 2x12 Supine isometric dead bug 2x20 sec Supine 90/90 heel taps 1x10 with short rests when she fatigues   DATE: 02/01/2024 Nustep level 6 x6 min with PT present to discuss status Hip Matrix 25# for hip abduction, hip extension, and hip flex (march)  1 x 10 each bilat Standing row with green tband tandem stance 2x12 cues for chest out to avoid thoracic kyphosis Standing shoulder extension with SLS with toe touch green tband 2x12 Standing paloff press with green tband x10 bilat Supine isometric dead bug 2x20 sec Supine 90/90 heel taps 2x10 Tandem walking on AirEx beam in parallel bars down and back 2x3 laps Side stepping on AirEx beam in parallel bars down and back 2x3 laps  Manual  Therapy: TPR to R QL in Left S/L Soft tissue mobilization to R QL/lumbar following DN to further promote tissue elongation and decreased pain.  Trigger Point Dry Needling  Subsequent Treatment: Instructions provided previously at initial dry needling treatment.   Patient Verbal Consent Given: Yes Education Handout Provided: Previously Provided Muscles Treated: R QL and lumbar paraspinals Electrical Stimulation Performed: No Treatment Response/Outcome: Utilized skilled palpation to identify bony landmarks and trigger points.  Able to illicit twitch response and muscle elongation.      DATE: 01/25/2024 Nustep level 6 x6 min with PT present to discuss status Seated hamstring stretch 2x20 sec bilat Seated piriformis stretch 2x20 sec bilat Hip Matrix 25# for hip abduction and hip extension x7 each bilat Tandem walking on AirEx beam in parallel bars down and back x3 laps Side stepping on AirEx beam in parallel bars down and back x3 laps High marching on trampoline x2 min Standing row with green tband 2x10 Standing shoulder extension with green tband 2x10 Standing paloff press with green tband x10 bilat Supine isometric dead bug 2x20 sec   PATIENT EDUCATION:  Education details: Issued HEP and provided handout and education on dry needling Person educated: Patient Education method: Explanation, Demonstration, and Handouts Education comprehension: verbalized understanding and returned demonstration  HOME EXERCISE PROGRAM: Access Code: [email or web address] URL: [email or web address] Date: 02/08/2024 Prepared by: Raynelle Fanning  Exercises - Seated Hamstring Stretch  - 1 x daily - 7 x weekly - 2 reps - 20 sec hold - Seated Piriformis Stretch with Trunk Bend  - 1 x daily - 7 x weekly - 2 reps - 20 sec hold - Seated Transversus Abdominis Bracing  - 1 x daily - 7  x weekly - 2 sets - 10 reps - Standing 'L' Stretch at Counter  - 1 x daily - 7 x weekly - 2 reps - 20 sec  hold - Seated Long Arc Quad   - 1 x daily - 7 x weekly - 2 sets - 10 reps - Sit to Stand Without Arm Support  - 1 x daily - 7 x weekly - 5 sets - 5-10 reps - Supine Lower Trunk Rotation  - 1 x daily - 7 x weekly - 10 reps - Supine March with Posterior Pelvic Tilt  - 1 x daily - 7 x weekly - 2 sets - 10 reps - Isometric Dead Bug  - 1 x daily - 7 x weekly - 5 reps - 10 sec hold - Standing Quadratus Lumborum Stretch with Doorway (Mirrored)  - 2 x daily - 7 x weekly - 1 sets - 2 reps - 60 sec hold - Prone Hip Extension with Pillow Under Abdomen  - 1 x daily - 3 x weekly - 2 sets - 10 reps  ASSESSMENT:  CLINICAL IMPRESSION:  Patient reports no pain on arrival today and has had relief since last visit. She still has some tightness in the R QL so stretch issued for HEP. Initiated prone hip extension which resulted in some stiffness in low back afterwards. Advised pt to use pillow under abdomen at home. Heel taps were more challenging to pt today when asked to slow them down and ensure hip was extending (not just knee flexing).   OBJECTIVE IMPAIRMENTS: decreased balance, difficulty walking, decreased strength, increased muscle spasms, impaired flexibility, postural dysfunction, and pain.   ACTIVITY LIMITATIONS: carrying, lifting, bending, standing, and squatting  PARTICIPATION LIMITATIONS: cleaning, shopping, community activity, and yard work  PERSONAL FACTORS: Past/current experiences and 3+ comorbidities: s/p lumbar kyphoplasty, right ankle fusion, osteopenia  are also affecting patient's functional outcome.   REHAB POTENTIAL: Good  CLINICAL DECISION MAKING: Stable/uncomplicated  EVALUATION COMPLEXITY: Low   GOALS: Goals reviewed with patient? Yes  SHORT TERM GOALS: Target date: 12/09/2023  Patient will be independent with initial HEP. Baseline: Goal status: Met on 11/23/23  2.  Patient will report at least a 25% improvement in symptoms since starting PT. Baseline:  Goal status: Met on 12/07/2023   LONG TERM  GOALS: Target date: 03/09/2024  Patient will be independent with advanced HEP to allow for self progression after discharge. Baseline:  Goal status: Ongoing  2.  Patient will increase LE and trunk functional strength to [facility] to allow her to lift buckets of dirt for gardening in the Spring. Baseline:  Goal status: Ongoing  3.  Patient will report ability to return to walking her dog daily on previous route without increased pain. Baseline:  Goal status: Ongoing  4.  Patient will increase FOTO to at least 61 to demonstrate improved functional mobility. Baseline: 51 Goal status: Met on 12/28/23  5.  Patient to report ability to return to going out to dinner or other outings with her friends without increased pain or muscle spasms.  Baseline:   Goal status:  Ongoing    PLAN:  PT FREQUENCY: 1x/week  PT DURATION: 8 weeks  PLANNED INTERVENTIONS: 97164- PT Re-evaluation, 97110-Therapeutic exercises, 97530- Therapeutic activity, [id number]- Neuromuscular re-education, 97535- Self Care, [id number]- Manual therapy, [phone]- Gait training, [phone]- Canalith repositioning, [id number]- Aquatic Therapy, 97014- Electrical stimulation (unattended), [id number]- Electrical stimulation (manual), [id number]- Ultrasound, Patient/Family education, Balance training, Stair training, Taping, Dry Needling, Joint mobilization, Joint manipulation, Spinal manipulation, Spinal mobilization,  Scar mobilization, Vestibular training, Cryotherapy, and Moist heat.  PLAN FOR NEXT SESSION: Assess response to DN/MT. Try prone lumbar stab, add QL stretch. Progress HEP as indicated, strengthening, core stability, flexibility, manual/dry needling as indicated   Solon Palm, PT  02/08/24, 1:20 PM  [facility] [address] [place], [place] [address] Phone # [phone] Fax [phone]

## 2024-02-08 ENCOUNTER — Ambulatory Visit: Admitting: Physical Therapy

## 2024-02-08 ENCOUNTER — Encounter: Payer: Self-pay | Admitting: Physical Therapy

## 2024-02-08 DIAGNOSIS — M5459 Other low back pain: Secondary | ICD-10-CM | POA: Diagnosis not present

## 2024-02-08 DIAGNOSIS — M6281 Muscle weakness (generalized): Secondary | ICD-10-CM

## 2024-02-08 DIAGNOSIS — R252 Cramp and spasm: Secondary | ICD-10-CM

## 2024-02-14 ENCOUNTER — Other Ambulatory Visit (HOSPITAL_COMMUNITY): Payer: Self-pay | Admitting: *Deleted

## 2024-02-15 ENCOUNTER — Ambulatory Visit (HOSPITAL_COMMUNITY)
Admission: RE | Admit: 2024-02-15 | Discharge: 2024-02-15 | Disposition: A | Discharge status: Home / Self Care | Source: Ambulatory Visit | Attending: Internal Medicine | Admitting: Internal Medicine

## 2024-02-15 ENCOUNTER — Ambulatory Visit: Admitting: Rehabilitative and Restorative Service Providers"

## 2024-02-15 DIAGNOSIS — M810 Age-related osteoporosis without current pathological fracture: Secondary | ICD-10-CM | POA: Insufficient documentation

## 2024-02-15 MED ORDER — DENOSUMAB 60 MG/ML ~~LOC~~ SOSY
60.0000 mg | PREFILLED_SYRINGE | Freq: Once | SUBCUTANEOUS | Status: AC
  Administered 2024-02-15: 60 mg via SUBCUTANEOUS

## 2024-02-15 MED ORDER — DENOSUMAB 60 MG/ML ~~LOC~~ SOSY
PREFILLED_SYRINGE | SUBCUTANEOUS | Status: AC
  Filled 2024-02-15: qty 1

## 2024-02-16 ENCOUNTER — Ambulatory Visit: Admitting: Physical Therapy

## 2024-02-16 ENCOUNTER — Encounter: Payer: Self-pay | Admitting: Physical Therapy

## 2024-02-16 DIAGNOSIS — M6281 Muscle weakness (generalized): Secondary | ICD-10-CM

## 2024-02-16 DIAGNOSIS — M5459 Other low back pain: Secondary | ICD-10-CM | POA: Diagnosis not present

## 2024-02-16 DIAGNOSIS — R252 Cramp and spasm: Secondary | ICD-10-CM | POA: Diagnosis not present

## 2024-02-16 NOTE — Therapy (Signed)
 OUTPATIENT PHYSICAL THERAPY TREATMENT NOTE   Patient Name: Mary Potter MRN: [id number] DOB:03/27/1946, 78 y.o., female Today's Date: 02/16/2024    END OF SESSION:  PT End of Session - 02/16/24 1015     Visit Number 18    Date for PT Re-Evaluation 03/09/24    Authorization Type Medicare    Progress Note Due on Visit 23    PT Start Time 1015    PT Stop Time 1058    PT Time Calculation (min) 43 min    Activity Tolerance Patient tolerated treatment well    Behavior During Therapy [facility] for tasks assessed/performed                  Past Medical History:  Diagnosis Date   Atrial fibrillation (HCC)    Dysrhythmia    A-fib   Endometrial hyperplasia 01/03/2015   GERD (gastroesophageal reflux disease)    History of kidney stones    Hyperlipidemia    Hypertension    Pneumonia    PONV (postoperative nausea and vomiting)    slow to wake up   Thickened endometrium 01/03/2015   Vaginal delivery 1976, 1985   Past Surgical History:  Procedure Laterality Date   ANKLE FRACTURE SURGERY     ATRIAL FIBRILLATION ABLATION N/A 02/08/2022   Procedure: ATRIAL FIBRILLATION ABLATION;  Surgeon: Boyce Byes, MD;  Location: [facility];  Service: Cardiovascular;  Laterality: N/A;   CARDIOVERSION N/A 02/17/2023   Procedure: CARDIOVERSION;  Surgeon: Jerryl Morin, DO;  Location: [facility];  Service: Cardiovascular;  Laterality: N/A;   CATARACT EXTRACTION Right 2020   DILATION AND CURETTAGE OF UTERUS  2012   ablation    EYE SURGERY     Corneal growth removal on right eye   HYSTEROSCOPY WITH D & C N/A 01/03/2015   Procedure: DILATATION AND CURETTAGE /HYSTEROSCOPY;  Surgeon: Terri Fester, MD;  Location: [facility];  Service: Gynecology;  Laterality: N/A;   LAPAROSCOPY     for endometriosis   ROBOTIC ASSISTED TOTAL HYSTERECTOMY WITH BILATERAL SALPINGO OOPHERECTOMY N/A 03/17/2021   Procedure: XI ROBOTIC ASSISTED TOTAL HYSTERECTOMY WITH BILATERAL SALPINGO OOPHORECTOMY;   Surgeon: Suzi Essex, MD;  Location: [facility];  Service: Gynecology;  Laterality: N/A;   SENTINEL NODE BIOPSY N/A 03/17/2021   Procedure: SENTINEL NODE BIOPSY;  Surgeon: Suzi Essex, MD;  Location: [facility];  Service: Gynecology;  Laterality: N/A;   TONSILLECTOMY AND ADENOIDECTOMY     Patient Active Problem List   Diagnosis Date Noted   Coronary artery disease involving native coronary artery of native heart without angina pectoris 03/28/2023   Hypercoagulable state due to paroxysmal atrial fibrillation (HCC) 03/02/2023   Persistent atrial fibrillation (HCC) 03/02/2023   Endometrial adenocarcinoma (HCC) 03/20/2021   Complex endometrial hyperplasia with atypia 02/18/2021   PAF (paroxysmal atrial fibrillation) (HCC) 10/15/2020   Elevated coronary artery calcium score 09/14/2020   Essential hypertension 09/14/2020   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 06/29/2018   Greater trochanteric bursitis of left hip 04/18/2018   Acute left lumbar radiculopathy 03/21/2018   Iliotibial band tendinitis of right side 09/30/2016   Trigger finger, right middle finger 09/30/2016   Thickened endometrium 01/03/2015   Endometrial hyperplasia 01/03/2015   Tibialis posterior tendonitis 05/09/2014   H/O ankle fusion 05/09/2014   Left leg pain 04/17/2014   Lipid disorder 11/14/2012   GERD 09/11/2010   CONSTIPATION 09/11/2010   DIARRHEA 09/11/2010    PCP: Barnetta Liberty, MD  REFERRING  PROVIDER: Johnita Nails, NP  REFERRING DIAG: S32.010A (ICD-10-CM) - Wedge compression fracture of first lumbar vertebra [facility])  Rationale for Evaluation and Treatment: Rehabilitation  THERAPY DIAG:  Other low back pain  Muscle weakness (generalized)  ONSET DATE: s/p kyphoplasty on 09/05/2023  SUBJECTIVE:                                                                                                                                                                                            SUBJECTIVE STATEMENT: I was a little sore the next day, but then it was better.    PERTINENT HISTORY:  Right Ankle Fusion, s/p kyphoplasty on 09/05/2023, osteopenia   PAIN:  Are you having pain? Yes: NPRS scale: 0/10 Pain location: right flank area of low back Pain description: soreness Aggravating factors: walking, reaching Relieving factors: sitting  PRECAUTIONS: None  RED FLAGS: Compression fracture: Yes: s/p kyphoplasty    WEIGHT BEARING RESTRICTIONS: No  FALLS:  Has patient fallen in last 6 months? No  LIVING ENVIRONMENT: Lives with: lives alone Lives in: House/apartment Stairs:  one home Has following equipment at home: Grab bars  OCCUPATION: Retired  PLOF: Independent and Leisure: gardening, reading, walking her dog, socializing with friends  PATIENT GOALS: To be stronger to be able to return to gardening and other desired tasks.  NEXT MD VISIT: As Needed  OBJECTIVE:  Note: Objective measures were completed at Evaluation unless otherwise noted.  DIAGNOSTIC FINDINGS:  Lumbar CT on 08/19/2023: IMPRESSION: 1. Redemonstrated subacute appearing compression fractures of the L1 and L2 superior endplates with slight interval progression of height loss at L1, now moderate. Unchanged moderate height loss at L2. 2. Mild spinal canal stenosis at T12-L1, L1-L2, and L3-L4. 3. Moderate spinal canal stenosis at L4-L5. 4. Cholelithiasis.  PATIENT SURVEYS:  Eval:  FOTO 51 (projected 45 by visit 12) 12/28/2023:  FOTO 61 (goal met)  COGNITION: Overall cognitive status: Within functional limits for tasks assessed     SENSATION: Patient denies and numbness or tingling  MUSCLE LENGTH: Hamstrings: Tightness bilaterally  POSTURE: rounded shoulders, forward head, and flexed trunk    LUMBAR ROM:   Eval:  Patient with painful lumbar ROM, limited at least 50%  LOWER EXTREMITY ROM:     WFL  LOWER EXTREMITY MMT:    Eval:   Right LE strength is grossly WFL Left  hip strength is 4/5  LUMBAR SPECIAL TESTS:  Slump test: Negative  FUNCTIONAL TESTS:  Eval: 5 times sit to stand: 15.07 sec 3 minute walk test: 560 ft with slight increase in pain in right flank area  12/26/2023: 3 minute walk test: 591  ft  01/02/2024: 5 times sit to stand:  10.16 sec 3 minute walk test:  694 ft  01/12/2024: 6 minute walk test:  1226 ft with RPE of 4-5/10 (age related norms are 1,304 ft)  GAIT: Distance walked: >500 ft Assistive device utilized: None Level of assistance: Complete Independence Comments: Patient with antalgic gait pattern with decreased step length and Trendelenburg gait  TODAY'S TREATMENT  DATE: 02/16/2024 Nustep level 5 x6 min with PT present to discuss status QL stretch in doorway x 30 sec  QL stretch bending fwd at sink and reaching Left x 20 sec - better Plank position at counter hip ext 1 x 10 B  time (prone hip ext painful at home so removed from HEP Hip Matrix 25# for hip abduction 2x10, hip extension 1x10, and hip flex (march)  1 x 10 each bilat Standing row with green tband on foam 2x15 cues for chest out to avoid thoracic kyphosis Lat pull 25# x  Standing shoulder extension with SLS with toe touch green tband 2x15 LTR with wide feet x 5 B 10 sec hold Supine isometric dead bug 2x20 sec Supine 90/90 heel taps 1x13 with short rests when she fatigues Hooklying alt leg press with TA contraction about 45 deg x 10 B   DATE: 02/08/2024 Nustep level 5 x6 min with PT present to discuss status QL stretch in doorway x 30 sec - also tried others but this one is best prone pelvic press 5 sec hold x 5 Prone hip ext with press 2 x 10 B - use pillow under waist next time Quadruped too difficult Pelvic rock in hooklying x 10 LTR with wide feet x 5 B 10 sec hold Hip Matrix 25# for hip abduction, hip extension, and hip flex (march)  1 x 10 each bilat Standing row with green tband tandem stance 2x12 cues for chest out to avoid thoracic  kyphosis Standing shoulder extension with SLS with toe touch green tband 2x12 Supine isometric dead bug 2x20 sec Supine 90/90 heel taps 1x10 with short rests when she fatigues   DATE: 02/01/2024 Nustep level 6 x6 min with PT present to discuss status Hip Matrix 25# for hip abduction, hip extension, and hip flex (march)  1 x 10 each bilat Standing row with green tband tandem stance 2x12 cues for chest out to avoid thoracic kyphosis Standing shoulder extension with SLS with toe touch green tband 2x12 Standing paloff press with green tband x10 bilat Supine isometric dead bug 2x20 sec Supine 90/90 heel taps 2x10 Tandem walking on AirEx beam in parallel bars down and back 2x3 laps Side stepping on AirEx beam in parallel bars down and back 2x3 laps  Manual Therapy: TPR to R QL in Left S/L Soft tissue mobilization to R QL/lumbar following DN to further promote tissue elongation and decreased pain.  Trigger Point Dry Needling  Subsequent Treatment: Instructions provided previously at initial dry needling treatment.   Patient Verbal Consent Given: Yes Education Handout Provided: Previously Provided Muscles Treated: R QL and lumbar paraspinals Electrical Stimulation Performed: No Treatment Response/Outcome: Utilized skilled palpation to identify bony landmarks and trigger points.  Able to illicit twitch response and muscle elongation.      DATE: 01/25/2024 Nustep level 6 x6 min with PT present to discuss status Seated hamstring stretch 2x20 sec bilat Seated piriformis stretch 2x20 sec bilat Hip Matrix 25# for hip abduction and hip extension x7 each bilat Tandem walking on AirEx beam in parallel bars down and back x3  laps Side stepping on AirEx beam in parallel bars down and back x3 laps High marching on trampoline x2 min Standing row with green tband 2x10 Standing shoulder extension with green tband 2x10 Standing paloff press with green tband x10 bilat Supine isometric dead bug 2x20  sec   PATIENT EDUCATION:  Education details: Issued HEP and provided handout and education on dry needling Person educated: Patient Education method: Explanation, Demonstration, and Handouts Education comprehension: verbalized understanding and returned demonstration  HOME EXERCISE PROGRAM: Access Code: [email or web address] URL: [email or web address] Date: 02/08/2024 Prepared by: Raynelle Fanning  Exercises - Seated Hamstring Stretch  - 1 x daily - 7 x weekly - 2 reps - 20 sec hold - Seated Piriformis Stretch with Trunk Bend  - 1 x daily - 7 x weekly - 2 reps - 20 sec hold - Seated Transversus Abdominis Bracing  - 1 x daily - 7 x weekly - 2 sets - 10 reps - Standing 'L' Stretch at Counter  - 1 x daily - 7 x weekly - 2 reps - 20 sec  hold - Seated Long Arc Quad  - 1 x daily - 7 x weekly - 2 sets - 10 reps - Sit to Stand Without Arm Support  - 1 x daily - 7 x weekly - 5 sets - 5-10 reps - Supine Lower Trunk Rotation  - 1 x daily - 7 x weekly - 10 reps - Supine March with Posterior Pelvic Tilt  - 1 x daily - 7 x weekly - 2 sets - 10 reps - Isometric Dead Bug  - 1 x daily - 7 x weekly - 5 reps - 10 sec hold - Standing Quadratus Lumborum Stretch with Doorway (Mirrored)  - 2 x daily - 7 x weekly - 1 sets - 2 reps - 60 sec hold   ASSESSMENT:  CLINICAL IMPRESSION:  Sharona is progressing with pain control. She is able to walk more with her dog although not yet to previous distance. She is having no more spasms meeting LTG #5. Prone hip extension was painful in her back, so this was removed from her HEP.  OBJECTIVE IMPAIRMENTS: decreased balance, difficulty walking, decreased strength, increased muscle spasms, impaired flexibility, postural dysfunction, and pain.   ACTIVITY LIMITATIONS: carrying, lifting, bending, standing, and squatting  PARTICIPATION LIMITATIONS: cleaning, shopping, community activity, and yard work  PERSONAL FACTORS: Past/current experiences and 3+ comorbidities: s/p lumbar  kyphoplasty, right ankle fusion, osteopenia  are also affecting patient's functional outcome.   REHAB POTENTIAL: Good  CLINICAL DECISION MAKING: Stable/uncomplicated  EVALUATION COMPLEXITY: Low   GOALS: Goals reviewed with patient? Yes  SHORT TERM GOALS: Target date: 12/09/2023  Patient will be independent with initial HEP. Baseline: Goal status: Met on 11/23/23  2.  Patient will report at least a 25% improvement in symptoms since starting PT. Baseline:  Goal status: Met on 12/07/2023   LONG TERM GOALS: Target date: 03/09/2024  Patient will be independent with advanced HEP to allow for self progression after discharge. Baseline:  Goal status: Ongoing  2.  Patient will increase LE and trunk functional strength to [facility] to allow her to lift buckets of dirt for gardening in the Spring. Baseline:  Goal status: Ongoing  3.  Patient will report ability to return to walking her dog daily on previous route without increased pain. Baseline:  Goal status: IN  PROGRESS 4/17  better, but isn't going as far as previously  4.  Patient will increase FOTO to at least 61 to demonstrate improved  functional mobility. Baseline: 51 Goal status: MET on 12/28/23  5.  Patient to report ability to return to going out to dinner or other outings with her friends without increased pain or muscle spasms.  Baseline:   Goal status:  MET 02/16/24    PLAN:  PT FREQUENCY: 1x/week  PT DURATION: 8 weeks  PLANNED INTERVENTIONS: 97164- PT Re-evaluation, 97110-Therapeutic exercises, 97530- Therapeutic activity, 97112- Neuromuscular re-education, 97535- Self Care, 16109- Manual therapy, [phone]- Gait training, [phone]- Canalith repositioning, [id number]- Aquatic Therapy, 97014- Electrical stimulation (unattended), [id number]- Electrical stimulation (manual), [id number]- Ultrasound, Patient/Family education, Balance training, Stair training, Taping, Dry Needling, Joint mobilization, Joint manipulation, Spinal manipulation, Spinal  mobilization, Scar mobilization, Vestibular training, Cryotherapy, and Moist heat.  PLAN FOR NEXT SESSION: Assess response to DN/MT. Try prone lumbar stab, add QL stretch. Progress HEP as indicated, strengthening, core stability, flexibility, manual/dry needling as indicated   Jinx Mourning, PT  02/16/24, 10:59 AM  [facility] [address] [place], [place] [address] Phone # [phone] Fax [phone]

## 2024-02-22 ENCOUNTER — Encounter: Payer: Self-pay | Admitting: Physical Therapy

## 2024-02-22 ENCOUNTER — Ambulatory Visit: Admitting: Physical Therapy

## 2024-02-22 DIAGNOSIS — M6281 Muscle weakness (generalized): Secondary | ICD-10-CM | POA: Diagnosis not present

## 2024-02-22 DIAGNOSIS — M5459 Other low back pain: Secondary | ICD-10-CM

## 2024-02-22 DIAGNOSIS — R252 Cramp and spasm: Secondary | ICD-10-CM | POA: Diagnosis not present

## 2024-02-22 NOTE — Therapy (Signed)
 OUTPATIENT PHYSICAL THERAPY TREATMENT NOTE   Patient Name: Mary Potter MRN: [id number] DOB:[birth year], 78 y.o., female Today's Date: 02/22/2024    END OF SESSION:  PT End of Session - 02/22/24 1235     Visit Number 19    Date for PT Re-Evaluation 03/09/24    Authorization Type Medicare    Progress Note Due on Visit 23    PT Start Time 1230    PT Stop Time 1314    PT Time Calculation (min) 44 min    Activity Tolerance Patient tolerated treatment well    Behavior During Therapy [facility] for tasks assessed/performed                   Past Medical History:  Diagnosis Date   Atrial fibrillation (HCC)    Dysrhythmia    A-fib   Endometrial hyperplasia 01/03/2015   GERD (gastroesophageal reflux disease)    History of kidney stones    Hyperlipidemia    Hypertension    Pneumonia    PONV (postoperative nausea and vomiting)    slow to wake up   Thickened endometrium 01/03/2015   Vaginal delivery 1976, 1985   Past Surgical History:  Procedure Laterality Date   ANKLE FRACTURE SURGERY     ATRIAL FIBRILLATION ABLATION N/A 02/08/2022   Procedure: ATRIAL FIBRILLATION ABLATION;  Surgeon: Boyce Byes, MD;  Location: [facility];  Service: Cardiovascular;  Laterality: N/A;   CARDIOVERSION N/A 02/17/2023   Procedure: CARDIOVERSION;  Surgeon: Jerryl Morin, DO;  Location: [facility];  Service: Cardiovascular;  Laterality: N/A;   CATARACT EXTRACTION Right 2020   DILATION AND CURETTAGE OF UTERUS  2012   ablation    EYE SURGERY     Corneal growth removal on right eye   HYSTEROSCOPY WITH D & C N/A 01/03/2015   Procedure: DILATATION AND CURETTAGE /HYSTEROSCOPY;  Surgeon: Terri Fester, MD;  Location: [facility];  Service: Gynecology;  Laterality: N/A;   LAPAROSCOPY     for endometriosis   ROBOTIC ASSISTED TOTAL HYSTERECTOMY WITH BILATERAL SALPINGO OOPHERECTOMY N/A 03/17/2021   Procedure: XI ROBOTIC ASSISTED TOTAL HYSTERECTOMY WITH BILATERAL SALPINGO OOPHORECTOMY;   Surgeon: Suzi Essex, MD;  Location: [facility];  Service: Gynecology;  Laterality: N/A;   SENTINEL NODE BIOPSY N/A 03/17/2021   Procedure: SENTINEL NODE BIOPSY;  Surgeon: Suzi Essex, MD;  Location: [facility];  Service: Gynecology;  Laterality: N/A;   TONSILLECTOMY AND ADENOIDECTOMY     Patient Active Problem List   Diagnosis Date Noted   Coronary artery disease involving native coronary artery of native heart without angina pectoris 03/28/2023   Hypercoagulable state due to paroxysmal atrial fibrillation (HCC) 03/02/2023   Persistent atrial fibrillation (HCC) 03/02/2023   Endometrial adenocarcinoma (HCC) 03/20/2021   Complex endometrial hyperplasia with atypia 02/18/2021   PAF (paroxysmal atrial fibrillation) (HCC) 10/15/2020   Elevated coronary artery calcium  score 09/14/2020   Essential hypertension 09/14/2020   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 06/29/2018   Greater trochanteric bursitis of left hip 04/18/2018   Acute left lumbar radiculopathy 03/21/2018   Iliotibial band tendinitis of right side 09/30/2016   Trigger finger, right middle finger 09/30/2016   Thickened endometrium 01/03/2015   Endometrial hyperplasia 01/03/2015   Tibialis posterior tendonitis 05/09/2014   H/O ankle fusion 05/09/2014   Left leg pain 04/17/2014   Lipid disorder 11/14/2012   GERD 09/11/2010   CONSTIPATION 09/11/2010   DIARRHEA 09/11/2010    PCP: Barnetta Liberty, MD  REFERRING PROVIDER: Johnita Nails, NP  REFERRING DIAG: S32.010A (ICD-10-CM) - Wedge compression fracture of first lumbar vertebra [facility])  Rationale for Evaluation and Treatment: Rehabilitation  THERAPY DIAG:  Other low back pain  Muscle weakness (generalized)  Cramp and spasm  ONSET DATE: s/p kyphoplasty on 09/05/2023  SUBJECTIVE:                                                                                                                                                                                            SUBJECTIVE STATEMENT: I was feeling that spot in my back a little. I've been stretching.   PERTINENT HISTORY:  Right Ankle Fusion, s/p kyphoplasty on 09/05/2023, osteopenia   PAIN:  Are you having pain? Yes: NPRS scale: 0/10 Pain location: right flank area of low back Pain description: soreness Aggravating factors: walking, reaching Relieving factors: sitting  PRECAUTIONS: None  RED FLAGS: Compression fracture: Yes: s/p kyphoplasty    WEIGHT BEARING RESTRICTIONS: No  FALLS:  Has patient fallen in last 6 months? No  LIVING ENVIRONMENT: Lives with: lives alone Lives in: House/apartment Stairs:  one home Has following equipment at home: Grab bars  OCCUPATION: Retired  PLOF: Independent and Leisure: gardening, reading, walking her dog, socializing with friends  PATIENT GOALS: To be stronger to be able to return to gardening and other desired tasks.  NEXT MD VISIT: As Needed  OBJECTIVE:  Note: Objective measures were completed at Evaluation unless otherwise noted.  DIAGNOSTIC FINDINGS:  Lumbar CT on 08/19/2023: IMPRESSION: 1. Redemonstrated subacute appearing compression fractures of the L1 and L2 superior endplates with slight interval progression of height loss at L1, now moderate. Unchanged moderate height loss at L2. 2. Mild spinal canal stenosis at T12-L1, L1-L2, and L3-L4. 3. Moderate spinal canal stenosis at L4-L5. 4. Cholelithiasis.  PATIENT SURVEYS:  Eval:  FOTO 51 (projected 29 by visit 12) 12/28/2023:  FOTO 61 (goal met)  COGNITION: Overall cognitive status: Within functional limits for tasks assessed     SENSATION: Patient denies and numbness or tingling  MUSCLE LENGTH: Hamstrings: Tightness bilaterally  POSTURE: rounded shoulders, forward head, and flexed trunk    LUMBAR ROM:   Eval:  Patient with painful lumbar ROM, limited at least 50%  LOWER EXTREMITY ROM:     WFL  LOWER EXTREMITY MMT:    Eval:   Right LE  strength is grossly WFL Left hip strength is 4/5  LUMBAR SPECIAL TESTS:  Slump test: Negative  FUNCTIONAL TESTS:  Eval: 5 times sit to stand: 15.07 sec 3 minute walk test: 560 ft with slight increase in pain in right flank area  12/26/2023: 3  minute walk test: 591 ft  01/02/2024: 5 times sit to stand:  10.16 sec 3 minute walk test:  694 ft  01/12/2024: 6 minute walk test:  1226 ft with RPE of 4-5/10 (age related norms are 1,304 ft)  GAIT: Distance walked: >500 ft Assistive device utilized: None Level of assistance: Complete Independence Comments: Patient with antalgic gait pattern with decreased step length and Trendelenburg gait  TODAY'S TREATMENT  DATE: 02/22/2024 Nustep level 6 x7 min with PT present to discuss status QL stretch in doorway 2 x 30 sec  Plank position at back of chair hip ext 1 x 10 B  time  Bird dog at back of chair 2x10 B Hip Matrix 25# for hip abduction, extension and flex (march)  2 x 10 each bilat Lat pull 25# 2 x 10  Trigger Point Dry Needling  Subsequent Treatment: Instructions provided previously at initial dry needling treatment.   Patient Verbal Consent Given: Yes Education Handout Provided: Previously Provided Muscles Treated: R QL Electrical Stimulation Performed: No Treatment Response/Outcome: Utilized skilled palpation to identify bony landmarks and trigger points.  Able to illicit twitch response and muscle elongation.  Soft tissue mobilization to R lumbar  following DN to further promote tissue elongation and decreased pain.   Trial of Addaday prior to DN to R lumbar x 2 min.    DATE: 02/16/2024 Nustep level 5 x6 min with PT present to discuss status QL stretch in doorway x 30 sec  QL stretch bending fwd at sink and reaching Left x 20 sec - better Plank position at counter hip ext 1 x 10 B  time (prone hip ext painful at home so removed from HEP Hip Matrix 25# for hip abduction 2x10, hip extension 1x10, and hip flex (march)  1 x 10  each bilat Standing row with green tband on foam 2x15 cues for chest out to avoid thoracic kyphosis Lat pull 25# x 10 Standing shoulder extension with SLS with toe touch green tband 2x15 LTR with wide feet x 5 B 10 sec hold Supine isometric dead bug 2x20 sec Supine 90/90 heel taps 1x13 with short rests when she fatigues Hooklying alt leg press with TA contraction about 45 deg x 10 B   DATE: 02/08/2024 Nustep level 5 x6 min with PT present to discuss status QL stretch in doorway x 30 sec - also tried others but this one is best prone pelvic press 5 sec hold x 5 Prone hip ext with press 2 x 10 B - use pillow under waist next time Quadruped too difficult Pelvic rock in hooklying x 10 LTR with wide feet x 5 B 10 sec hold Hip Matrix 25# for hip abduction, hip extension, and hip flex (march)  1 x 10 each bilat Standing row with green tband tandem stance 2x12 cues for chest out to avoid thoracic kyphosis Standing shoulder extension with SLS with toe touch green tband 2x12 Supine isometric dead bug 2x20 sec Supine 90/90 heel taps 1x10 with short rests when she fatigues   PATIENT EDUCATION:  Education details: Issued HEP and provided handout and education on dry needling Person educated: Patient Education method: Explanation, Demonstration, and Handouts Education comprehension: verbalized understanding and returned demonstration  HOME EXERCISE PROGRAM: Access Code: [email or web address] URL: [email or web address] Date: 02/22/2024 Prepared by: Concha Deed  Exercises - Seated Hamstring Stretch  - 1 x daily - 7 x weekly - 2 reps - 20 sec hold - Seated Piriformis Stretch with Trunk Bend  - 1 x daily -  7 x weekly - 2 reps - 20 sec hold - Seated Transversus Abdominis Bracing  - 1 x daily - 7 x weekly - 2 sets - 10 reps - Standing 'L' Stretch at Counter  - 1 x daily - 7 x weekly - 2 reps - 20 sec  hold - Seated Long Arc Quad  - 1 x daily - 7 x weekly - 2 sets - 10 reps - Sit to Stand Without Arm  Support  - 1 x daily - 7 x weekly - 5 sets - 5-10 reps - Supine Lower Trunk Rotation  - 1 x daily - 7 x weekly - 10 reps - Supine March with Posterior Pelvic Tilt  - 1 x daily - 7 x weekly - 2 sets - 10 reps - Isometric Dead Bug  - 1 x daily - 7 x weekly - 5 reps - 10 sec hold - Standing Quadratus Lumborum Stretch with Doorway (Mirrored)  - 2 x daily - 7 x weekly - 1 sets - 2 reps - 60 sec hold - Bird Dog on Counter  - 1 x daily - 3 x weekly - 2 sets - 10 reps - 5 sec hold   ASSESSMENT:  CLINICAL IMPRESSION:  Deangela reports she started feeling tightness in her R low back again last night and again this morning. She had increased TTP in the R QL and increased tension. Immediate release with DN/MT. Bird dog added in standing today which was challenging. Patient is on track with her POC and should be ready for discharge in 1-2 more visits.  OBJECTIVE IMPAIRMENTS: decreased balance, difficulty walking, decreased strength, increased muscle spasms, impaired flexibility, postural dysfunction, and pain.   ACTIVITY LIMITATIONS: carrying, lifting, bending, standing, and squatting  PARTICIPATION LIMITATIONS: cleaning, shopping, community activity, and yard work  PERSONAL FACTORS: Past/current experiences and 3+ comorbidities: s/p lumbar kyphoplasty, right ankle fusion, osteopenia  are also affecting patient's functional outcome.   REHAB POTENTIAL: Good  CLINICAL DECISION MAKING: Stable/uncomplicated  EVALUATION COMPLEXITY: Low   GOALS: Goals reviewed with patient? Yes  SHORT TERM GOALS: Target date: 12/09/2023  Patient will be independent with initial HEP. Baseline: Goal status: Met on 11/23/23  2.  Patient will report at least a 25% improvement in symptoms since starting PT. Baseline:  Goal status: Met on 12/07/2023   LONG TERM GOALS: Target date: 03/09/2024  Patient will be independent with advanced HEP to allow for self progression after discharge. Baseline:  Goal status: Ongoing  2.   Patient will increase LE and trunk functional strength to [facility] to allow her to lift buckets of dirt for gardening in the Spring. Baseline:  Goal status: Ongoing  3.  Patient will report ability to return to walking her dog daily on previous route without increased pain. Baseline:  Goal status: IN  PROGRESS 4/17  better, but isn't going as far as previously  4.  Patient will increase FOTO to at least 61 to demonstrate improved functional mobility. Baseline: 51 Goal status: MET on 12/28/23  5.  Patient to report ability to return to going out to dinner or other outings with her friends without increased pain or muscle spasms.  Baseline:   Goal status:  MET 02/16/24    PLAN:  PT FREQUENCY: 1x/week  PT DURATION: 8 weeks  PLANNED INTERVENTIONS: 97164- PT Re-evaluation, 97110-Therapeutic exercises, 97530- Therapeutic activity, 97112- Neuromuscular re-education, 97535- Self Care, 44010- Manual therapy, [id number]- Gait training, [phone]- Canalith repositioning, [id number]- Aquatic Therapy, 97014- Electrical stimulation (unattended), [id number]-  Electrical stimulation (manual), 16109- Ultrasound, Patient/Family education, Balance training, Stair training, Taping, Dry Needling, Joint mobilization, Joint manipulation, Spinal manipulation, Spinal mobilization, Scar mobilization, Vestibular training, Cryotherapy, and Moist heat.  PLAN FOR NEXT SESSION: Assess response to DN, continue core stability, flexibility, manual/dry needling as indicated   Jinx Mourning, PT  02/22/24, 1:23 PM  [facility] [address] [place], [place] [address] Phone # [phone] Fax [phone]

## 2024-02-29 ENCOUNTER — Ambulatory Visit: Admitting: Rehabilitative and Restorative Service Providers"

## 2024-02-29 ENCOUNTER — Encounter: Payer: Self-pay | Admitting: Rehabilitative and Restorative Service Providers"

## 2024-02-29 DIAGNOSIS — M6281 Muscle weakness (generalized): Secondary | ICD-10-CM

## 2024-02-29 DIAGNOSIS — R252 Cramp and spasm: Secondary | ICD-10-CM | POA: Diagnosis not present

## 2024-02-29 DIAGNOSIS — M5459 Other low back pain: Secondary | ICD-10-CM

## 2024-02-29 NOTE — Therapy (Signed)
 OUTPATIENT PHYSICAL THERAPY TREATMENT NOTE   Patient Name: Mary Potter MRN: [id number] DOB:[birth year], 78 y.o., female Today's Date: 02/29/2024    END OF SESSION:  PT End of Session - 02/29/24 1232     Visit Number 20    Date for PT Re-Evaluation 03/09/24    Authorization Type Medicare    Progress Note Due on Visit 23    PT Start Time 1230    PT Stop Time 1310    PT Time Calculation (min) 40 min    Activity Tolerance Patient tolerated treatment well    Behavior During Therapy [facility] for tasks assessed/performed                   Past Medical History:  Diagnosis Date   Atrial fibrillation (HCC)    Dysrhythmia    A-fib   Endometrial hyperplasia 01/03/2015   GERD (gastroesophageal reflux disease)    History of kidney stones    Hyperlipidemia    Hypertension    Pneumonia    PONV (postoperative nausea and vomiting)    slow to wake up   Thickened endometrium 01/03/2015   Vaginal delivery 1976, 1985   Past Surgical History:  Procedure Laterality Date   ANKLE FRACTURE SURGERY     ATRIAL FIBRILLATION ABLATION N/A 02/08/2022   Procedure: ATRIAL FIBRILLATION ABLATION;  Surgeon: Boyce Byes, MD;  Location: [facility];  Service: Cardiovascular;  Laterality: N/A;   CARDIOVERSION N/A 02/17/2023   Procedure: CARDIOVERSION;  Surgeon: Jerryl Morin, DO;  Location: [facility];  Service: Cardiovascular;  Laterality: N/A;   CATARACT EXTRACTION Right 2020   DILATION AND CURETTAGE OF UTERUS  2012   ablation    EYE SURGERY     Corneal growth removal on right eye   HYSTEROSCOPY WITH D & C N/A 01/03/2015   Procedure: DILATATION AND CURETTAGE /HYSTEROSCOPY;  Surgeon: Terri Fester, MD;  Location: [facility];  Service: Gynecology;  Laterality: N/A;   LAPAROSCOPY     for endometriosis   ROBOTIC ASSISTED TOTAL HYSTERECTOMY WITH BILATERAL SALPINGO OOPHERECTOMY N/A 03/17/2021   Procedure: XI ROBOTIC ASSISTED TOTAL HYSTERECTOMY WITH BILATERAL SALPINGO OOPHORECTOMY;   Surgeon: Suzi Essex, MD;  Location: [facility];  Service: Gynecology;  Laterality: N/A;   SENTINEL NODE BIOPSY N/A 03/17/2021   Procedure: SENTINEL NODE BIOPSY;  Surgeon: Suzi Essex, MD;  Location: [facility];  Service: Gynecology;  Laterality: N/A;   TONSILLECTOMY AND ADENOIDECTOMY     Patient Active Problem List   Diagnosis Date Noted   Coronary artery disease involving native coronary artery of native heart without angina pectoris 03/28/2023   Hypercoagulable state due to paroxysmal atrial fibrillation (HCC) 03/02/2023   Persistent atrial fibrillation (HCC) 03/02/2023   Endometrial adenocarcinoma (HCC) 03/20/2021   Complex endometrial hyperplasia with atypia 02/18/2021   PAF (paroxysmal atrial fibrillation) (HCC) 10/15/2020   Elevated coronary artery calcium  score 09/14/2020   Essential hypertension 09/14/2020   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 06/29/2018   Greater trochanteric bursitis of left hip 04/18/2018   Acute left lumbar radiculopathy 03/21/2018   Iliotibial band tendinitis of right side 09/30/2016   Trigger finger, right middle finger 09/30/2016   Thickened endometrium 01/03/2015   Endometrial hyperplasia 01/03/2015   Tibialis posterior tendonitis 05/09/2014   H/O ankle fusion 05/09/2014   Left leg pain 04/17/2014   Lipid disorder 11/14/2012   GERD 09/11/2010   CONSTIPATION 09/11/2010   DIARRHEA 09/11/2010    PCP: Barnetta Liberty, MD  REFERRING PROVIDER: Johnita Nails, NP  REFERRING DIAG: S32.010A (ICD-10-CM) - Wedge compression fracture of first lumbar vertebra [facility])  Rationale for Evaluation and Treatment: Rehabilitation  THERAPY DIAG:  Other low back pain  Muscle weakness (generalized)  Cramp and spasm  ONSET DATE: s/p kyphoplasty on 09/05/2023  SUBJECTIVE:                                                                                                                                                                                            SUBJECTIVE STATEMENT: Reports that she was sore the day after dry needling, but then she felt better.  PERTINENT HISTORY:  Right Ankle Fusion, s/p kyphoplasty on 09/05/2023, osteopenia   PAIN:  Are you having pain? Yes: NPRS scale: 0-1/10 Pain location: right flank area of low back Pain description: soreness Aggravating factors: walking, reaching Relieving factors: sitting  PRECAUTIONS: None  RED FLAGS: Compression fracture: Yes: s/p kyphoplasty    WEIGHT BEARING RESTRICTIONS: No  FALLS:  Has patient fallen in last 6 months? No  LIVING ENVIRONMENT: Lives with: lives alone Lives in: House/apartment Stairs:  one home Has following equipment at home: Grab bars  OCCUPATION: Retired  PLOF: Independent and Leisure: gardening, reading, walking her dog, socializing with friends  PATIENT GOALS: To be stronger to be able to return to gardening and other desired tasks.  NEXT MD VISIT: As Needed  OBJECTIVE:  Note: Objective measures were completed at Evaluation unless otherwise noted.  DIAGNOSTIC FINDINGS:  Lumbar CT on 08/19/2023: IMPRESSION: 1. Redemonstrated subacute appearing compression fractures of the L1 and L2 superior endplates with slight interval progression of height loss at L1, now moderate. Unchanged moderate height loss at L2. 2. Mild spinal canal stenosis at T12-L1, L1-L2, and L3-L4. 3. Moderate spinal canal stenosis at L4-L5. 4. Cholelithiasis.  PATIENT SURVEYS:  Eval:  FOTO 51 (projected 60 by visit 12) 12/28/2023:  FOTO 61 (goal met)  COGNITION: Overall cognitive status: Within functional limits for tasks assessed     SENSATION: Patient denies and numbness or tingling  MUSCLE LENGTH: Hamstrings: Tightness bilaterally  POSTURE: rounded shoulders, forward head, and flexed trunk    LUMBAR ROM:   Eval:  Patient with painful lumbar ROM, limited at least 50%  LOWER EXTREMITY ROM:     WFL  LOWER EXTREMITY MMT:    Eval:    Right LE strength is grossly WFL Left hip strength is 4/5  LUMBAR SPECIAL TESTS:  Slump test: Negative  FUNCTIONAL TESTS:  Eval: 5 times sit to stand: 15.07 sec 3 minute walk test: 560 ft with slight increase in pain in right flank area  12/26/2023:  3 minute walk test: 591 ft  01/02/2024: 5 times sit to stand:  10.16 sec 3 minute walk test:  694 ft  01/12/2024: 6 minute walk test:  1226 ft with RPE of 4-5/10 (age related norms are 1,304 ft)  02/29/2024: 6 minute walk test: 1,515 ft outside with RPE of 6/10  GAIT: Distance walked: >500 ft Assistive device utilized: None Level of assistance: Complete Independence Comments: Patient with antalgic gait pattern with decreased step length and Trendelenburg gait  TODAY'S TREATMENT  DATE: 02/29/2024 Nustep level 5 x6 min with PT present to discuss status Hip Matrix 25# for hip abduction, extension and flex (march)  2 x 10 each bilat 6 minute walk test: 1,515 ft outside with RPE of 6/10 Plank position at back of chair hip ext 1 x 10 B  time Bird dog at back of chair 2x10 B Hip flexor stretch at stairs with overhead lateral reach x10 bilat Standing lat pulldown 30# 2x10 Lateral lunges with unilateral foot on slider x10 bilat   DATE: 02/22/2024 Nustep level 6 x7 min with PT present to discuss status QL stretch in doorway 2 x 30 sec  Plank position at back of chair hip ext 1 x 10 B  time  Bird dog at back of chair 2x10 B Hip Matrix 25# for hip abduction, extension and flex (march)  2 x 10 each bilat Lat pull 25# 2 x 10  Trigger Point Dry Needling  Subsequent Treatment: Instructions provided previously at initial dry needling treatment.   Patient Verbal Consent Given: Yes Education Handout Provided: Previously Provided Muscles Treated: R QL Electrical Stimulation Performed: No Treatment Response/Outcome: Utilized skilled palpation to identify bony landmarks and trigger points.  Able to illicit twitch response and muscle  elongation.  Soft tissue mobilization to R lumbar  following DN to further promote tissue elongation and decreased pain.   Trial of [place] prior to DN to R lumbar x 2 min.    DATE: 02/16/2024 Nustep level 5 x6 min with PT present to discuss status QL stretch in doorway x 30 sec  QL stretch bending fwd at sink and reaching Left x 20 sec - better Plank position at counter hip ext 1 x 10 B  time (prone hip ext painful at home so removed from HEP Hip Matrix 25# for hip abduction 2x10, hip extension 1x10, and hip flex (march)  1 x 10 each bilat Standing row with green tband on foam 2x15 cues for chest out to avoid thoracic kyphosis Lat pull 25# x 10 Standing shoulder extension with SLS with toe touch green tband 2x15 LTR with wide feet x 5 B 10 sec hold Supine isometric dead bug 2x20 sec Supine 90/90 heel taps 1x13 with short rests when she fatigues Hooklying alt leg press with TA contraction about 45 deg x 10 B   PATIENT EDUCATION:  Education details: Issued HEP and provided handout and education on dry needling Person educated: Patient Education method: Explanation, Demonstration, and Handouts Education comprehension: verbalized understanding and returned demonstration  HOME EXERCISE PROGRAM: Access Code: [email or web address] URL: [email or web address] Date: 02/22/2024 Prepared by: Concha Deed  Exercises - Seated Hamstring Stretch  - 1 x daily - 7 x weekly - 2 reps - 20 sec hold - Seated Piriformis Stretch with Trunk Bend  - 1 x daily - 7 x weekly - 2 reps - 20 sec hold - Seated Transversus Abdominis Bracing  - 1 x daily - 7 x weekly - 2 sets - 10 reps - Standing 'L'  Stretch at Counter  - 1 x daily - 7 x weekly - 2 reps - 20 sec  hold - Seated Long Arc Quad  - 1 x daily - 7 x weekly - 2 sets - 10 reps - Sit to Stand Without Arm Support  - 1 x daily - 7 x weekly - 5 sets - 5-10 reps - Supine Lower Trunk Rotation  - 1 x daily - 7 x weekly - 10 reps - Supine March with Posterior Pelvic  Tilt  - 1 x daily - 7 x weekly - 2 sets - 10 reps - Isometric Dead Bug  - 1 x daily - 7 x weekly - 5 reps - 10 sec hold - Standing Quadratus Lumborum Stretch with Doorway (Mirrored)  - 2 x daily - 7 x weekly - 1 sets - 2 reps - 60 sec hold - Bird Dog on Counter  - 1 x daily - 3 x weekly - 2 sets - 10 reps - 5 sec hold   ASSESSMENT:  CLINICAL IMPRESSION: Ms Schuman presents to skilled PT reporting that she has been practicing the new exercises and is doing better with them.  She reports that the painful area in her back ultimately was alleviated with the dry needling last session. Patient with improved score noted on 6 minute walk test today.  Patient did have increased dyspnea with 6 minute walk today, however, the walk occurred outside and with warmer temperatures today.  Patient is on track for discharge next visit, as anticipated.  OBJECTIVE IMPAIRMENTS: decreased balance, difficulty walking, decreased strength, increased muscle spasms, impaired flexibility, postural dysfunction, and pain.   ACTIVITY LIMITATIONS: carrying, lifting, bending, standing, and squatting  PARTICIPATION LIMITATIONS: cleaning, shopping, community activity, and yard work  PERSONAL FACTORS: Past/current experiences and 3+ comorbidities: s/p lumbar kyphoplasty, right ankle fusion, osteopenia  are also affecting patient's functional outcome.   REHAB POTENTIAL: Good  CLINICAL DECISION MAKING: Stable/uncomplicated  EVALUATION COMPLEXITY: Low   GOALS: Goals reviewed with patient? Yes  SHORT TERM GOALS: Target date: 12/09/2023  Patient will be independent with initial HEP. Baseline: Goal status: Met on 11/23/23  2.  Patient will report at least a 25% improvement in symptoms since starting PT. Baseline:  Goal status: Met on 12/07/2023   LONG TERM GOALS: Target date: 03/09/2024  Patient will be independent with advanced HEP to allow for self progression after discharge. Baseline:  Goal status: Ongoing  2.   Patient will increase LE and trunk functional strength to [facility] to allow her to lift buckets of dirt for gardening in the Spring. Baseline:  Goal status: Ongoing  3.  Patient will report ability to return to walking her dog daily on previous route without increased pain. Baseline:  Goal status: IN  PROGRESS 4/17  better, but isn't going as far as previously  4.  Patient will increase FOTO to at least 61 to demonstrate improved functional mobility. Baseline: 51 Goal status: MET on 12/28/23  5.  Patient to report ability to return to going out to dinner or other outings with her friends without increased pain or muscle spasms.  Baseline:   Goal status:  MET 02/16/24    PLAN:  PT FREQUENCY: 1x/week  PT DURATION: 8 weeks  PLANNED INTERVENTIONS: 97164- PT Re-evaluation, 97110-Therapeutic exercises, 97530- Therapeutic activity, 97112- Neuromuscular re-education, 97535- Self Care, [address]- Manual therapy, [id number]- Gait training, [phone]- Canalith repositioning, [id number]- Aquatic Therapy, 97014- Electrical stimulation (unattended), [phone]- Electrical stimulation (manual), [id number]- Ultrasound, Patient/Family education, Balance training, Stair  training, Taping, Dry Needling, Joint mobilization, Joint manipulation, Spinal manipulation, Spinal mobilization, Scar mobilization, Vestibular training, Cryotherapy, and Moist heat.  PLAN FOR NEXT SESSION: Discharge visit   Felishia Wartman, [place], DPT 02/29/24, 1:19 PM  [facility] [address] [place], [place] [address] Phone # [phone] Fax [phone]

## 2024-03-07 ENCOUNTER — Encounter: Payer: Self-pay | Admitting: Rehabilitative and Restorative Service Providers"

## 2024-03-07 ENCOUNTER — Ambulatory Visit: Attending: Student | Admitting: Rehabilitative and Restorative Service Providers"

## 2024-03-07 DIAGNOSIS — R252 Cramp and spasm: Secondary | ICD-10-CM | POA: Diagnosis not present

## 2024-03-07 DIAGNOSIS — M6281 Muscle weakness (generalized): Secondary | ICD-10-CM | POA: Insufficient documentation

## 2024-03-07 DIAGNOSIS — M5459 Other low back pain: Secondary | ICD-10-CM | POA: Insufficient documentation

## 2024-03-07 NOTE — Therapy (Signed)
 OUTPATIENT PHYSICAL THERAPY TREATMENT NOTE AND DISCHARGE SUMMARY   Patient Name: Mary Potter MRN: [id number] DOB:Aug 23, 1946, 78 y.o., female Today's Date: 03/07/2024    END OF SESSION:  PT End of Session - 03/07/24 1231     Visit Number 21    Date for PT Re-Evaluation 03/09/24    Authorization Type Medicare    Progress Note Due on Visit 23    PT Start Time 1230    PT Stop Time 1310    PT Time Calculation (min) 40 min    Activity Tolerance Patient tolerated treatment well    Behavior During Therapy [facility] for tasks assessed/performed                   Past Medical History:  Diagnosis Date   Atrial fibrillation (HCC)    Dysrhythmia    A-fib   Endometrial hyperplasia 01/03/2015   GERD (gastroesophageal reflux disease)    History of kidney stones    Hyperlipidemia    Hypertension    Pneumonia    PONV (postoperative nausea and vomiting)    slow to wake up   Thickened endometrium 01/03/2015   Vaginal delivery 1976, 1985   Past Surgical History:  Procedure Laterality Date   ANKLE FRACTURE SURGERY     ATRIAL FIBRILLATION ABLATION N/A 02/08/2022   Procedure: ATRIAL FIBRILLATION ABLATION;  Surgeon: Boyce Byes, MD;  Location: [facility];  Service: Cardiovascular;  Laterality: N/A;   CARDIOVERSION N/A 02/17/2023   Procedure: CARDIOVERSION;  Surgeon: Jerryl Morin, DO;  Location: [facility];  Service: Cardiovascular;  Laterality: N/A;   CATARACT EXTRACTION Right 2020   DILATION AND CURETTAGE OF UTERUS  2012   ablation    EYE SURGERY     Corneal growth removal on right eye   HYSTEROSCOPY WITH D & C N/A 01/03/2015   Procedure: DILATATION AND CURETTAGE /HYSTEROSCOPY;  Surgeon: Terri Fester, MD;  Location: [facility];  Service: Gynecology;  Laterality: N/A;   LAPAROSCOPY     for endometriosis   ROBOTIC ASSISTED TOTAL HYSTERECTOMY WITH BILATERAL SALPINGO OOPHERECTOMY N/A 03/17/2021   Procedure: XI ROBOTIC ASSISTED TOTAL HYSTERECTOMY WITH BILATERAL  SALPINGO OOPHORECTOMY;  Surgeon: Suzi Essex, MD;  Location: [facility];  Service: Gynecology;  Laterality: N/A;   SENTINEL NODE BIOPSY N/A 03/17/2021   Procedure: SENTINEL NODE BIOPSY;  Surgeon: Suzi Essex, MD;  Location: [facility];  Service: Gynecology;  Laterality: N/A;   TONSILLECTOMY AND ADENOIDECTOMY     Patient Active Problem List   Diagnosis Date Noted   Coronary artery disease involving native coronary artery of native heart without angina pectoris 03/28/2023   Hypercoagulable state due to paroxysmal atrial fibrillation (HCC) 03/02/2023   Persistent atrial fibrillation (HCC) 03/02/2023   Endometrial adenocarcinoma (HCC) 03/20/2021   Complex endometrial hyperplasia with atypia 02/18/2021   PAF (paroxysmal atrial fibrillation) (HCC) 10/15/2020   Elevated coronary artery calcium  score 09/14/2020   Essential hypertension 09/14/2020   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 06/29/2018   Greater trochanteric bursitis of left hip 04/18/2018   Acute left lumbar radiculopathy 03/21/2018   Iliotibial band tendinitis of right side 09/30/2016   Trigger finger, right middle finger 09/30/2016   Thickened endometrium 01/03/2015   Endometrial hyperplasia 01/03/2015   Tibialis posterior tendonitis 05/09/2014   H/O ankle fusion 05/09/2014   Left leg pain 04/17/2014   Lipid disorder 11/14/2012   GERD 09/11/2010   CONSTIPATION 09/11/2010   DIARRHEA 09/11/2010    PCP: Jolee Naval,  Geralyn Knee, MD  REFERRING PROVIDER: Johnita Nails, NP  REFERRING DIAG: S32.010A (ICD-10-CM) - Wedge compression fracture of first lumbar vertebra [facility])  Rationale for Evaluation and Treatment: Rehabilitation  THERAPY DIAG:  Other low back pain  Muscle weakness (generalized)  Cramp and spasm  ONSET DATE: s/p kyphoplasty on 09/05/2023  SUBJECTIVE:                                                                                                                                                                                            SUBJECTIVE STATEMENT: Patient states that she has been able to walk her dog like she was previously without difficulty.  States she is ready for discharge.  States pain currently 0.5/10  PERTINENT HISTORY:  Right Ankle Fusion, s/p kyphoplasty on 09/05/2023, osteopenia   PAIN:  Are you having pain? Yes: NPRS scale: 0-1/10 Pain location: right flank area of low back Pain description: soreness Aggravating factors: walking, reaching Relieving factors: sitting  PRECAUTIONS: None  RED FLAGS: Compression fracture: Yes: s/p kyphoplasty    WEIGHT BEARING RESTRICTIONS: No  FALLS:  Has patient fallen in last 6 months? No  LIVING ENVIRONMENT: Lives with: lives alone Lives in: House/apartment Stairs:  one home Has following equipment at home: Grab bars  OCCUPATION: Retired  PLOF: Independent and Leisure: gardening, reading, walking her dog, socializing with friends  PATIENT GOALS: To be stronger to be able to return to gardening and other desired tasks.  NEXT MD VISIT: As Needed  OBJECTIVE:  Note: Objective measures were completed at Evaluation unless otherwise noted.  DIAGNOSTIC FINDINGS:  Lumbar CT on 08/19/2023: IMPRESSION: 1. Redemonstrated subacute appearing compression fractures of the L1 and L2 superior endplates with slight interval progression of height loss at L1, now moderate. Unchanged moderate height loss at L2. 2. Mild spinal canal stenosis at T12-L1, L1-L2, and L3-L4. 3. Moderate spinal canal stenosis at L4-L5. 4. Cholelithiasis.  PATIENT SURVEYS:  Eval:  FOTO 51 (projected 85 by visit 12) 12/28/2023:  FOTO 61 (goal met)  COGNITION: Overall cognitive status: Within functional limits for tasks assessed     SENSATION: Patient denies and numbness or tingling  MUSCLE LENGTH: Hamstrings: Tightness bilaterally  POSTURE: rounded shoulders, forward head, and flexed trunk    LUMBAR ROM:   Eval:  Patient with  painful lumbar ROM, limited at least 50%  LOWER EXTREMITY ROM:     WFL  LOWER EXTREMITY MMT:    Eval:   Right LE strength is grossly WFL Left hip strength is 4/5  LUMBAR SPECIAL TESTS:  Slump test: Negative  FUNCTIONAL TESTS:  Eval: 5 times sit to stand: 15.07 sec  3 minute walk test: 560 ft with slight increase in pain in right flank area  12/26/2023: 3 minute walk test: 591 ft  01/02/2024: 5 times sit to stand:  10.16 sec 3 minute walk test:  694 ft  01/12/2024: 6 minute walk test:  1226 ft with RPE of 4-5/10 (age related norms are 1,304 ft)  02/29/2024: 6 minute walk test: 1,515 ft outside with RPE of 6/10  GAIT: Distance walked: >500 ft Assistive device utilized: None Level of assistance: Complete Independence Comments: Patient with antalgic gait pattern with decreased step length and Trendelenburg gait  TODAY'S TREATMENT  DATE: 03/06/2024 Nustep level 6 x6 min with PT present to discuss status Standing 4 way hip with small pink power cord x10 each bilat Side stepping with red tband around thighs 2x15 ft bilat Step up/through on AirEx foam x10 bilat Standing hamstring stretch on stairs 2x20 sec bilat Side step up 6" step x10 bilat Education about continued HEP after discharge   DATE: 02/29/2024 Nustep level 5 x6 min with PT present to discuss status Hip Matrix 25# for hip abduction, extension and flex (march)  2 x 10 each bilat 6 minute walk test: 1,515 ft outside with RPE of 6/10 Plank position at back of chair hip ext 1 x 10 B  time Bird dog at back of chair 2x10 B Hip flexor stretch at stairs with overhead lateral reach x10 bilat Standing lat pulldown 30# 2x10 Lateral lunges with unilateral foot on slider x10 bilat     PATIENT EDUCATION:  Education details: Issued HEP and provided handout and education on dry needling Person educated: Patient Education method: Explanation, Demonstration, and Handouts Education comprehension: verbalized understanding  and returned demonstration  HOME EXERCISE PROGRAM: Access Code: [email or web address] URL: [email or web address] Date: 02/22/2024 Prepared by: Concha Deed  Exercises - Seated Hamstring Stretch  - 1 x daily - 7 x weekly - 2 reps - 20 sec hold - Seated Piriformis Stretch with Trunk Bend  - 1 x daily - 7 x weekly - 2 reps - 20 sec hold - Seated Transversus Abdominis Bracing  - 1 x daily - 7 x weekly - 2 sets - 10 reps - Standing 'L' Stretch at Counter  - 1 x daily - 7 x weekly - 2 reps - 20 sec  hold - Seated Long Arc Quad  - 1 x daily - 7 x weekly - 2 sets - 10 reps - Sit to Stand Without Arm Support  - 1 x daily - 7 x weekly - 5 sets - 5-10 reps - Supine Lower Trunk Rotation  - 1 x daily - 7 x weekly - 10 reps - Supine March with Posterior Pelvic Tilt  - 1 x daily - 7 x weekly - 2 sets - 10 reps - Isometric Dead Bug  - 1 x daily - 7 x weekly - 5 reps - 10 sec hold - Standing Quadratus Lumborum Stretch with Doorway (Mirrored)  - 2 x daily - 7 x weekly - 1 sets - 2 reps - 60 sec hold - Bird Dog on Counter  - 1 x daily - 3 x weekly - 2 sets - 10 reps - 5 sec hold   ASSESSMENT:  CLINICAL IMPRESSION: Ms Filtz presents to skilled PT reporting that she is doing well and able to walk her dog on normal distance as she did previously.  Patient has met all goals at this time and demonstrates improved form during session.  Patient provided with red theraband for side  stepping at home for HEP.  Patient is ready for discharge from skilled PT to day to continue with HEP and follow up with MD, as needed.  OBJECTIVE IMPAIRMENTS: decreased balance, difficulty walking, decreased strength, increased muscle spasms, impaired flexibility, postural dysfunction, and pain.   ACTIVITY LIMITATIONS: carrying, lifting, bending, standing, and squatting  PARTICIPATION LIMITATIONS: cleaning, shopping, community activity, and yard work  PERSONAL FACTORS: Past/current experiences and 3+ comorbidities: s/p lumbar  kyphoplasty, right ankle fusion, osteopenia  are also affecting patient's functional outcome.   REHAB POTENTIAL: Good  CLINICAL DECISION MAKING: Stable/uncomplicated  EVALUATION COMPLEXITY: Low   GOALS: Goals reviewed with patient? Yes  SHORT TERM GOALS: Target date: 12/09/2023  Patient will be independent with initial HEP. Baseline: Goal status: Met on 11/23/23  2.  Patient will report at least a 25% improvement in symptoms since starting PT. Baseline:  Goal status: Met on 12/07/2023   LONG TERM GOALS: Target date: 03/09/2024  Patient will be independent with advanced HEP to allow for self progression after discharge. Baseline:  Goal status: Met on 03/07/24  2.  Patient will increase LE and trunk functional strength to [facility] to allow her to lift buckets of dirt for gardening in the Spring. Baseline:  Goal status: Met on 03/07/24  3.  Patient will report ability to return to walking her dog daily on previous route without increased pain. Baseline:  Goal status: Met on 03/07/24  4.  Patient will increase FOTO to at least 61 to demonstrate improved functional mobility. Baseline: 51 Goal status: MET on 12/28/23  5.  Patient to report ability to return to going out to dinner or other outings with her friends without increased pain or muscle spasms.  Baseline:   Goal status:  MET 02/16/24    PLAN:  PT FREQUENCY: 1x/week  PT DURATION: 8 weeks  PLANNED INTERVENTIONS: 97164- PT Re-evaluation, 97110-Therapeutic exercises, 97530- Therapeutic activity, 97112- Neuromuscular re-education, 97535- Self Care, 64332- Manual therapy, [phone]- Gait training, [phone]- Canalith repositioning, [id number]- Aquatic Therapy, 97014- Electrical stimulation (unattended), [id number]- Electrical stimulation (manual), [id number]- Ultrasound, Patient/Family education, Balance training, Stair training, Taping, Dry Needling, Joint mobilization, Joint manipulation, Spinal manipulation, Spinal mobilization, Scar mobilization,  Vestibular training, Cryotherapy, and Moist heat.  PHYSICAL THERAPY DISCHARGE SUMMARY  See above for information during episode.  Patient agrees to discharge. Patient goals were met. Patient is being discharged due to meeting the stated rehab goals.    Robyne Christen, PT, DPT 03/07/24, 1:38 PM  [facility] [address] [place], [place] [address] Phone # [phone] Fax [phone]

## 2024-03-21 ENCOUNTER — Inpatient Hospital Stay: Payer: Medicare Other | Attending: Hematology

## 2024-03-21 DIAGNOSIS — D472 Monoclonal gammopathy: Secondary | ICD-10-CM | POA: Diagnosis not present

## 2024-03-21 LAB — CBC WITH DIFFERENTIAL (CANCER CENTER ONLY)
Abs Immature Granulocytes: 0 10*3/uL (ref 0.00–0.07)
Basophils Absolute: 0 10*3/uL (ref 0.0–0.1)
Basophils Relative: 1 %
Eosinophils Absolute: 0.2 10*3/uL (ref 0.0–0.5)
Eosinophils Relative: 5 %
HCT: 30.8 % — ABNORMAL LOW (ref 36.0–46.0)
Hemoglobin: 10.2 g/dL — ABNORMAL LOW (ref 12.0–15.0)
Immature Granulocytes: 0 %
Lymphocytes Relative: 24 %
Lymphs Abs: 1.1 10*3/uL (ref 0.7–4.0)
MCH: 30.8 pg (ref 26.0–34.0)
MCHC: 33.1 g/dL (ref 30.0–36.0)
MCV: 93.1 fL (ref 80.0–100.0)
Monocytes Absolute: 0.3 10*3/uL (ref 0.1–1.0)
Monocytes Relative: 7 %
Neutro Abs: 2.8 10*3/uL (ref 1.7–7.7)
Neutrophils Relative %: 63 %
Platelet Count: 227 10*3/uL (ref 150–400)
RBC: 3.31 MIL/uL — ABNORMAL LOW (ref 3.87–5.11)
RDW: 15.5 % (ref 11.5–15.5)
WBC Count: 4.4 10*3/uL (ref 4.0–10.5)
nRBC: 0 % (ref 0.0–0.2)

## 2024-03-21 LAB — CMP (CANCER CENTER ONLY)
ALT: 16 U/L (ref 0–44)
AST: 18 U/L (ref 15–41)
Albumin: 4.4 g/dL (ref 3.5–5.0)
Alkaline Phosphatase: 41 U/L (ref 38–126)
Anion gap: 4 — ABNORMAL LOW (ref 5–15)
BUN: 17 mg/dL (ref 8–23)
CO2: 30 mmol/L (ref 22–32)
Calcium: 9.6 mg/dL (ref 8.9–10.3)
Chloride: 104 mmol/L (ref 98–111)
Creatinine: 0.71 mg/dL (ref 0.44–1.00)
GFR, Estimated: >60 mL/min (ref >60)
Glucose, Bld: 120 mg/dL — ABNORMAL HIGH (ref 70–99)
Potassium: 4.1 mmol/L (ref 3.5–5.1)
Sodium: 138 mmol/L (ref 135–145)
Total Bilirubin: 0.5 mg/dL (ref 0.0–1.2)
Total Protein: 8 g/dL (ref 6.5–8.1)

## 2024-03-22 LAB — KAPPA/LAMBDA LIGHT CHAINS
Kappa free light chain: 233.6 mg/L — ABNORMAL HIGH (ref 3.3–19.4)
Kappa, lambda light chain ratio: 80.55 — ABNORMAL HIGH (ref 0.26–1.65)
Lambda free light chains: 2.9 mg/L — ABNORMAL LOW (ref 5.7–26.3)

## 2024-03-23 LAB — MULTIPLE MYELOMA PANEL, SERUM
Albumin SerPl Elph-Mcnc: 3.9 g/dL (ref 2.9–4.4)
Albumin/Glob SerPl: 1.2 (ref 0.7–1.7)
Alpha 1: 0.2 g/dL (ref 0.0–0.4)
Alpha2 Glob SerPl Elph-Mcnc: 0.7 g/dL (ref 0.4–1.0)
B-Globulin SerPl Elph-Mcnc: 1 g/dL (ref 0.7–1.3)
Gamma Glob SerPl Elph-Mcnc: 1.7 g/dL (ref 0.4–1.8)
Globulin, Total: 3.5 g/dL (ref 2.2–3.9)
IgA: 1856 mg/dL — ABNORMAL HIGH (ref 64–422)
IgG (Immunoglobin G), Serum: 541 mg/dL — ABNORMAL LOW (ref 586–1602)
IgM (Immunoglobulin M), Srm: 17 mg/dL — ABNORMAL LOW (ref 26–217)
M Protein SerPl Elph-Mcnc: 1 g/dL — ABNORMAL HIGH
Total Protein ELP: 7.4 g/dL (ref 6.0–8.5)

## 2024-04-03 NOTE — Progress Notes (Signed)
 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  Today, she is accompanied by her daughter and son-in-law. Patient has had two vertebral compression fractures in her L-spine. She reports that her initial back fracture was from pulling too hard while adjusting her shoes, and the second occurred after pushing on a trash can lid.   Her back pain continues to be bothersome, though it has improved. She did previously have mild pain raidating to her left lower extremity, which has resolved. Back brace does improve back pain.  The first time she had a compression fracture, she was told that she was developing the beginnings of osteopenia. Patient reports that she did not tolerate Fosamax and she stopped taking it. She has not had a bone density study recently. Her last bone density study from 2018 showed osteopenia. Patient has never been on prolia  previously, though there are considerations to start it soon.   She reports having a muscle spasm on her left side, which is settling with muscle relaxants. She denies any leg swelling.   Patient reports a history of kidney stones, during which she had protein in her urine.  Patient has regularly been on vitamin D . She has been off of calcium  for several years ago because of her fhx of  heart disease.   INTERVAL HISTORY:  Mary Potter is a wonderful 78 y.o. 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   I last connected with the patient on 11/29/2023 via telemedicine visit.  She is accompanied by her husband during today's visit. Patient reports that she is feeling better overall since her last clinical visit.   Patient reports that when she stands too quickly, she endorses lightheadedness. She also reports mild SOB when walking her dog, which she attributes to low hgb. Patient notes that her cardiologist did not feel that her SOB was cardiac-related.   Patient notes that she received her second prolia  shot.   She reports that she was engaging in "intense" physical therapy from January-May, which improved her back pain. Patient has no uncontrolled back pain at this time.   She denies any abdominal pain, new lumps/bumps, or leg swelling.   She reports that she regularly takes Nexium in the mornings and takes Tums as needed.   She reports that she regularly takes a blood thinner.   Patient reports a couple occasions of mild blood when wiping.She has no other reason for blood loss.   She denies any bleeding issues such as nose bleeds, gum bleeds, hematuria, injuries causing bleeding, or other bleeding issues.   She notes that she recently restarted calcium  replacement 600 MG twice a day. Patient also takes vitamin D  supplementation.  Patient reports she she and her family have an "unusually strong immune system". She notes that she has never had chicken pox in the past.   She denies any new bone pain or recent infection  issues.   She reports that she has not had a COVID-19 infection in over a year.   She reports no other changes in the last 4 months.   She notes that her granddaughter will be visiting her June 7-28 and she would like to have her bone marrow exam in early July.   MEDICAL HISTORY:  Past Medical History:   Diagnosis Date   Atrial fibrillation (HCC)    Dysrhythmia    A-fib   Endometrial hyperplasia 01/03/2015   GERD (gastroesophageal reflux disease)    History of kidney stones    Hyperlipidemia    Hypertension    Pneumonia    PONV (postoperative nausea and vomiting)    slow to wake up   Thickened endometrium 01/03/2015   Vaginal delivery 1976, 1985    SURGICAL HISTORY: Past Surgical History:  Procedure Laterality Date   ANKLE FRACTURE SURGERY     ATRIAL FIBRILLATION ABLATION N/A 02/08/2022   Procedure: ATRIAL FIBRILLATION ABLATION;  Surgeon: Boyce Byes, MD;  Location: [facility];  Service: Cardiovascular;  Laterality: N/A;   CARDIOVERSION N/A 02/17/2023   Procedure: CARDIOVERSION;  Surgeon: Jerryl Morin, DO;  Location: [facility];  Service: Cardiovascular;  Laterality: N/A;   CATARACT EXTRACTION Right 2020   DILATION AND CURETTAGE OF UTERUS  2012   ablation    EYE SURGERY     Corneal growth removal on right eye   HYSTEROSCOPY WITH D & C N/A 01/03/2015   Procedure: DILATATION AND CURETTAGE /HYSTEROSCOPY;  Surgeon: Terri Fester, MD;  Location: [facility];  Service: Gynecology;  Laterality: N/A;   LAPAROSCOPY     for endometriosis   ROBOTIC ASSISTED TOTAL HYSTERECTOMY WITH BILATERAL SALPINGO OOPHERECTOMY N/A 03/17/2021   Procedure: XI ROBOTIC ASSISTED TOTAL HYSTERECTOMY WITH BILATERAL SALPINGO OOPHORECTOMY;  Surgeon: Suzi Essex, MD;  Location: [facility];  Service: Gynecology;  Laterality: N/A;   SENTINEL NODE BIOPSY N/A 03/17/2021   Procedure: SENTINEL NODE BIOPSY;  Surgeon: Suzi Essex, MD;  Location: [facility];  Service: Gynecology;  Laterality: N/A;   TONSILLECTOMY AND ADENOIDECTOMY      SOCIAL HISTORY: Social History   Socioeconomic History   Marital status: Widowed    Spouse name: Audrene Blessing   Number of children: 2   Years of education: 14   Highest education level: Not on file  Occupational History   Occupation: retired    Comment: [employer]  Tobacco  Use   Smoking status: Never   Smokeless tobacco: Never   Tobacco comments:    Never smoke 03/02/23  Vaping Use   Vaping status: Never Used  Substance and Sexual Activity   Alcohol  use: No    Alcohol /week: 0.0 standard drinks of alcohol    Drug use: No   Sexual activity: Yes    Birth control/protection: Post-menopausal  Other Topics Concern   Not on file  Social History Narrative   Lives with her younger daughter, [name]. Her older daughter lives in [place] with her family.   Social Drivers of Corporate investment banker Strain: Not on file  Food Insecurity: Not on file  Transportation Needs: Not on file  Physical Activity: Not on file  Stress: Not on file  Social Connections: Not on file  Intimate Partner Violence: Not on file    FAMILY HISTORY: Family History  Problem Relation Age of Onset   Heart disease Mother 90       CABG   Stroke Father    Prostate cancer Father  Heart disease Brother 27       Transplant, died 10 years   Heart disease Maternal Grandmother    Heart disease Paternal Grandmother    Cancer Paternal Grandfather    Heart disease Brother 44       RF   Leukemia Brother    Leukemia Brother    Hypertension Sister    Cervical cancer Sister    Colon cancer Neg Hx    Breast cancer Neg Hx    Ovarian cancer Neg Hx    Endometrial cancer Neg Hx    Pancreatic cancer Neg Hx     ALLERGIES:  is allergic to talwin [pentazocine], hydrocodone, and prednisone .  MEDICATIONS:  Current Outpatient Medications  Medication Sig Dispense Refill   acetaminophen  (TYLENOL ) 500 MG tablet Take 1,000 mg by mouth every 6 (six) hours as needed (Arthritis Pain).     ALPRAZolam (XANAX) 0.5 MG tablet Take 0.25 mg by mouth daily as needed (anxiousness).     apixaban  (ELIQUIS ) 5 MG TABS tablet Take 1 tablet (5 mg total) by mouth 2 (two) times daily. 180 tablet 3   Cholecalciferol (VITAMIN D ) 50 MCG (2000 UT) CAPS Take 2,000 Units by mouth at bedtime.     cyclobenzaprine   (FLEXERIL ) 10 MG tablet Take 1 tablet (10 mg total) by mouth 2 (two) times daily as needed for muscle spasms. (Patient not taking: Reported on 09/20/2023) 20 tablet 0   estradiol  (VIVELLE -DOT) 0.025 MG/24HR PLACE 1 PATCH ONTO THE SKIN 2 (TWO) TIMES A WEEK. SUNDAYS & WEDNESDAYS 8 patch 0   lidocaine  (HM LIDOCAINE  PATCH) 4 % Place 1 patch onto the skin daily. (Patient not taking: Reported on 09/20/2023) 10 patch 0   methocarbamol (ROBAXIN) 500 MG tablet Take 500 mg by mouth every 8 (eight) hours as needed. (Patient not taking: Reported on 09/20/2023)     metoprolol  tartrate (LOPRESSOR ) 50 MG tablet TAKE ONE TABLET BY MOUTH TWICE A DAY 180 tablet 3   olmesartan (BENICAR) 40 MG tablet Take 40 mg by mouth daily.     oxyCODONE  (OXY IR/ROXICODONE ) 5 MG immediate release tablet Take 5 mg by mouth every 8 (eight) hours as needed for moderate pain. (Patient not taking: Reported on 09/20/2023)     Polyethyl Glycol-Propyl Glycol (SYSTANE OP) Place 1 drop into both eyes daily.     rosuvastatin  (CRESTOR ) 20 MG tablet Take 1 tablet (20 mg total) by mouth every evening. 90 tablet 3   No current facility-administered medications for this visit.    REVIEW OF SYSTEMS:    10 Point review of Systems was done is negative except as noted above.  PHYSICAL EXAMINATION:  .BP (!) 150/75   Pulse 86   Temp 98.1 F (36.7 C) (Temporal)   Resp 18   Ht 5\' 3"  (1.6 m)   Wt 162 lb 12.8 oz (73.8 kg)   SpO2 97%   BMI 28.84 kg/m  GENERAL:alert, in no acute distress and comfortable SKIN: no acute rashes, no significant lesions EYES: conjunctiva are pink and non-injected, sclera anicteric OROPHARYNX: MMM, no exudates, no oropharyngeal erythema or ulceration NECK: supple, no JVD LYMPH:  no palpable lymphadenopathy in the cervical, axillary or inguinal regions LUNGS: clear to auscultation b/l with normal respiratory effort HEART: regular rate & rhythm ABDOMEN:  normoactive bowel sounds , non tender, not  distended. Extremity: no pedal edema PSYCH: alert & oriented x 3 with fluent speech NEURO: no focal motor/sensory deficits   LABORATORY DATA:  I have reviewed the data as listed  .  Latest Ref Rng & Units 04/04/2024   11:12 AM 04/04/2024   11:11 AM 03/21/2024    2:02 PM  CBC  WBC 4.0 - 10.5 K/uL  3.6  4.4   Hemoglobin 12.0 - 15.0 g/dL  16.1  09.6   Hematocrit 34.0 - 46.6 % 33.0  32.3  30.8   Platelets 150 - 400 K/uL  240  227     .    Latest Ref Rng & Units 03/21/2024    2:02 PM 11/22/2023    1:29 PM 08/19/2023    9:23 AM  CMP  Glucose 70 - 99 mg/dL 045  409  811   BUN 8 - 23 mg/dL 17  19  10    Creatinine 0.44 - 1.00 mg/dL 9.14  7.82  9.56   Sodium 135 - 145 mmol/L 138  139  137   Potassium 3.5 - 5.1 mmol/L 4.1  4.0  3.8   Chloride 98 - 111 mmol/L 104  104  104   CO2 22 - 32 mmol/L 30  29  21    Calcium  8.9 - 10.3 mg/dL 9.6  21.3  8.4   Total Protein 6.5 - 8.1 g/dL 8.0  7.8    Total Bilirubin 0.0 - 1.2 mg/dL 0.5  0.4    Alkaline Phos 38 - 126 U/L 41  41    AST 15 - 41 U/L 18  15    ALT 0 - 44 U/L 16  10     IFE/PE, serum 07/21/2023:   RADIOGRAPHIC STUDIES: I have personally reviewed the radiological images as listed and agreed with the findings in the report. No results found.  ASSESSMENT & PLAN:   78 y.o. female with:  IgA kappa biclonal gammopathy  PLAN:  -Discussed lab results from 03/21/2024 in detail with patient. CBC showed WBC of 4.4K, hemoglobin of 10.2, and platelets of 227K. -her anemia has worsensed from peak of 12.7 eight months ago to 10.2 currently -discussed that anemia is one of the markers for active plasma cell disorders -WBCs and platelets are stable -CMP is mostly stable. Her kidney function and calcium  is stable -her M protein has increased from 0.8 g/dL to 1 g/dL in about 4 months -her IgA total protein has increased from 1200s mg/dL to nearly 0865H mg/dL which is a sgnificant increase -K/L light chain ratio increased to 80 -discussed  that her IgA type protein has a higher risk of having more abnormal behavior in terms of overgrowth of plasma cell -MCV is normal and MCV is not suggestive of iron deficiency -it is less likely that she has a nutritional issue given her RDW is unchanged -discussed that it is possible for there to be a small spike in M protein from a COVID-19 infection in a reactive fashion, but it would be a very small increase and temporary. Also discussed that this type of increase would affect IgG antibodies and not IgA.  -discussed that some viral infections can cause temporary effects on blood counts -did not feel any enlarged lymph nodes during physical examination -discussed that acid suppressant use can cause vitamin deficiencies -discussed that she can have decent calcium  intake from dairy in the diet -discussed option of PET scan and bone marrow biopsy for further evaluation -discussed details of bone marrow biopsy process -educated patient that the immune system is complex with lots of systems involved and it would be difficult to define a "strong immune system" -will order labs to ensure there are no other process causing  anemia such as other deficiencies -patient is agreeable to receiving a PET scan at this time -We will plan for bone marrow exam in early July per her request -answered all of patient's questions in detail  FOLLOW-UP: Labs today CT bm Bx in 4 weeks Pet/ct in 2-3 weeks RTC with Dr Salomon Cree with labs in 6 weeks  The total time spent in the appointment was 30 minutes* .  All of the patient's questions were answered with apparent satisfaction. The patient knows to call the clinic with any problems, questions or concerns.   Jacquelyn Matt MD MS AAHIVMS [facility] Hematology/Oncology Physician [facility]  .*Total Encounter Time as defined by the Centers for Medicare and Medicaid Services includes, in addition to the face-to-face time of a patient visit (documented in the  note above) non-face-to-face time: obtaining and reviewing outside history, ordering and reviewing medications, tests or procedures, care coordination (communications with other health care professionals or caregivers) and documentation in the medical record.    I,Mitra Faeizi,acting as a Neurosurgeon for Jacquelyn Matt, MD.,have documented all relevant documentation on the behalf of Jacquelyn Matt, MD,as directed by  Jacquelyn Matt, MD while in the presence of Jacquelyn Matt, MD.  .I have reviewed the above documentation for accuracy and completeness, and I agree with the above. .Dhara Schepp Kishore Miroslav Gin MD

## 2024-04-04 ENCOUNTER — Inpatient Hospital Stay: Payer: Medicare Other | Attending: Hematology | Admitting: Hematology

## 2024-04-04 ENCOUNTER — Inpatient Hospital Stay

## 2024-04-04 VITALS — BP 150/75 | HR 86 | Temp 98.1°F | Resp 18 | Ht 63.0 in | Wt 162.8 lb

## 2024-04-04 DIAGNOSIS — Z8542 Personal history of malignant neoplasm of other parts of uterus: Secondary | ICD-10-CM | POA: Insufficient documentation

## 2024-04-04 DIAGNOSIS — Z90722 Acquired absence of ovaries, bilateral: Secondary | ICD-10-CM | POA: Insufficient documentation

## 2024-04-04 DIAGNOSIS — Z90710 Acquired absence of both cervix and uterus: Secondary | ICD-10-CM | POA: Insufficient documentation

## 2024-04-04 DIAGNOSIS — Z9079 Acquired absence of other genital organ(s): Secondary | ICD-10-CM | POA: Insufficient documentation

## 2024-04-04 DIAGNOSIS — D649 Anemia, unspecified: Secondary | ICD-10-CM

## 2024-04-04 DIAGNOSIS — D472 Monoclonal gammopathy: Secondary | ICD-10-CM

## 2024-04-04 LAB — IRON AND IRON BINDING CAPACITY (CC-WL,HP ONLY)
Iron: 74 ug/dL (ref 28–170)
Saturation Ratios: 21 % (ref 10.4–31.8)
TIBC: 350 ug/dL (ref 250–450)
UIBC: 276 ug/dL (ref 148–442)

## 2024-04-04 LAB — RETIC PANEL
Immature Retic Fract: 13.7 % (ref 2.3–15.9)
RBC.: 3.42 MIL/uL — ABNORMAL LOW (ref 3.87–5.11)
Retic Count, Absolute: 57.5 10*3/uL (ref 19.0–186.0)
Retic Ct Pct: 1.7 % (ref 0.4–3.1)
Reticulocyte Hemoglobin: 34.8 pg (ref >27.9)

## 2024-04-04 LAB — CBC WITH DIFFERENTIAL (CANCER CENTER ONLY)
Abs Immature Granulocytes: 0.04 10*3/uL (ref 0.00–0.07)
Basophils Absolute: 0 10*3/uL (ref 0.0–0.1)
Basophils Relative: 0 %
Eosinophils Absolute: 0.1 10*3/uL (ref 0.0–0.5)
Eosinophils Relative: 4 %
HCT: 32.3 % — ABNORMAL LOW (ref 36.0–46.0)
Hemoglobin: 10.4 g/dL — ABNORMAL LOW (ref 12.0–15.0)
Immature Granulocytes: 1 %
Lymphocytes Relative: 25 %
Lymphs Abs: 0.9 10*3/uL (ref 0.7–4.0)
MCH: 30.1 pg (ref 26.0–34.0)
MCHC: 32.2 g/dL (ref 30.0–36.0)
MCV: 93.4 fL (ref 80.0–100.0)
Monocytes Absolute: 0.2 10*3/uL (ref 0.1–1.0)
Monocytes Relative: 6 %
Neutro Abs: 2.3 10*3/uL (ref 1.7–7.7)
Neutrophils Relative %: 64 %
Platelet Count: 240 10*3/uL (ref 150–400)
RBC: 3.46 MIL/uL — ABNORMAL LOW (ref 3.87–5.11)
RDW: 15 % (ref 11.5–15.5)
WBC Count: 3.6 10*3/uL — ABNORMAL LOW (ref 4.0–10.5)
nRBC: 0 % (ref 0.0–0.2)

## 2024-04-04 LAB — TSH: TSH: 1.52 u[IU]/mL (ref 0.350–4.500)

## 2024-04-04 LAB — LACTATE DEHYDROGENASE: LDH: 116 U/L (ref 98–192)

## 2024-04-04 LAB — FERRITIN: Ferritin: 85 ng/mL (ref 11–307)

## 2024-04-04 LAB — VITAMIN B12: Vitamin B-12: 158 pg/mL — ABNORMAL LOW (ref 180–914)

## 2024-04-05 LAB — HAPTOGLOBIN: Haptoglobin: 140 mg/dL (ref 42–346)

## 2024-04-05 LAB — FOLATE RBC
Folate, Hemolysate: 401 ng/mL
Folate, RBC: 1215 ng/mL (ref >498)
Hematocrit: 33 % — ABNORMAL LOW (ref 34.0–46.6)

## 2024-04-10 ENCOUNTER — Encounter: Payer: Self-pay | Admitting: Gynecologic Oncology

## 2024-04-12 ENCOUNTER — Inpatient Hospital Stay: Payer: Medicare Other | Admitting: Gynecologic Oncology

## 2024-04-12 ENCOUNTER — Encounter: Payer: Self-pay | Admitting: Gynecologic Oncology

## 2024-04-12 VITALS — BP 120/66 | HR 74 | Temp 98.3°F | Resp 16 | Ht 63.0 in | Wt 163.2 lb

## 2024-04-12 DIAGNOSIS — D472 Monoclonal gammopathy: Secondary | ICD-10-CM | POA: Diagnosis not present

## 2024-04-12 DIAGNOSIS — C541 Malignant neoplasm of endometrium: Secondary | ICD-10-CM

## 2024-04-12 DIAGNOSIS — D649 Anemia, unspecified: Secondary | ICD-10-CM | POA: Diagnosis not present

## 2024-04-12 DIAGNOSIS — Z8542 Personal history of malignant neoplasm of other parts of uterus: Secondary | ICD-10-CM

## 2024-04-12 DIAGNOSIS — Z9079 Acquired absence of other genital organ(s): Secondary | ICD-10-CM | POA: Diagnosis not present

## 2024-04-12 DIAGNOSIS — Z90722 Acquired absence of ovaries, bilateral: Secondary | ICD-10-CM | POA: Diagnosis not present

## 2024-04-12 DIAGNOSIS — Z90710 Acquired absence of both cervix and uterus: Secondary | ICD-10-CM | POA: Diagnosis not present

## 2024-04-12 NOTE — Patient Instructions (Addendum)
 It was good seeing you today. No concerning findings on today's exam.   Plan to follow up in six months with your gynecologist and then office in one year or sooner if needed. Please call our office at [phone] after you have seen your gynecologist to schedule an appointment with Dr. Orvil Bland.   Continue with your workup with Dr. Salomon Cree. We are here for any needs during this time.   Symptoms to report to your health care team include vaginal bleeding, rectal bleeding, bloating, weight loss without effort, new and persistent pain, new and  persistent fatigue, new leg swelling, new masses (i.e., bumps in your neck or groin), new and persistent cough, new and persistent nausea and vomiting, change in bowel or bladder habits, and any other concerns.

## 2024-04-12 NOTE — Progress Notes (Signed)
 Gynecologic Oncology Return Clinic Visit  04/12/24  Reason for Visit: surveillance visit in the setting of uterine cancer   Treatment History: Oncology History Overview Note  MMR IHC intact MSI stable   Endometrial adenocarcinoma (HCC)  02/10/2021 Imaging   Pelvic ultrasound: thickened endometrium with slight vascularity at the fundus.  The lining measured 13.5 mm.     02/16/2021 Initial Biopsy   EMB: [facility]   03/17/2021 Surgery   Robotic-assisted laparoscopic total hysterectomy with bilateral salpingoophorectomy, SLN biopsy   Findings: On EUA, small mobile uterus. Normal upper abdominal survey on intra-abdominal entry. Normal omentum, small and large bowel. 6-8 cm uterus, normal appearing. Normal bilateral adnexa. Mapping successful to bilateral SLNs. No intra-abdominal or pelvic evidence of disease.   03/17/2021 Pathology Results   A. SENTINEL LYMPH NODE, RIGHT EXTERNAL ILIAC, EXCISION:  - One lymph node negative for metastatic carcinoma (0/1).   B. SENTINEL LYMPH NODE, LEFT OBTURATOR, EXCISION:  - One lymph node negative for metastatic carcinoma (0/1).   C. 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      Focal superficial myometrial invasion.      No lymphovascular invasion.      Cervix, bilateral ovaries and bilateral fallopian tubes negative for  carcinoma.   ONCOLOGY TABLE:  UTERUS, CARCINOMA OR CARCINOSARCOMA: Resection  Procedure: Total hysterectomy, bilateral salpingo-oophorectomy with  right and left sentinel lymph nodes.  Histologic Type: Endometrioid adenocarcinoma.  Histologic Grade: FIGO grade 1.  Myometrial Invasion:       Depth of Myometrial Invasion: 2 mm.       Myometrial Thickness (mm): 25.       Percentage of Myometrial Invasion: 8%.  Uterine Serosa Involvement: Not identified.  Cervical stromal Involvement: Not identified.  Extent of involvement  of other tissue/organs: Not identified.  Lymphovascular Invasion: Not identified.  Regional Lymph Nodes:       Pelvic Lymph Nodes Examined: 2                                      Sentinel: 2                                      Non-sentinel: 0                                      Total: 2       Lymph Nodes with Metastasis: 0  Pathologic Stage Classification (pTNM, AJCC 8th Edition): pT1a, pN0  Ancillary Studies: MMR and MSI testing have been ordered.  Representative Tumor Block: C3-C9.  Comment(s): Cytokeratin AE1/AE3 performed on the sentinel lymph nodes  (parts A and B) is negative for metastatic carcinoma.  ([id number])    03/20/2021 Initial Diagnosis   Endometrial adenocarcinoma (HCC)     Interval History: Doing well from a gynecologic cancer standpoint.  Denies any vaginal bleeding or discharge.  Denies abdominal or pelvic pain.  Reports baseline bowel bladder function.  Last cologuard was normal. Has some bloating as well as heartburn, on Nexium which seems to help symptoms.  Had follow-up with her cardiologist in December, was noted to be in arrhythmia.  No intervention was recommended at the  time. She is tolerating the oral estrogen well with no known side effects.  Has had 3 compression fractures total in Aug 2024 (1) and September 2024 (2) since her last visit with us . She under kyphoplasty in November 2024 and has recently underwent intensive physical therapy.  Is currently undergoing workup with Dr. Salomon Cree including plan for CT bone marrow biopsy and PET scan.  Past Medical/Surgical History: Past Medical History:  Diagnosis Date   Atrial fibrillation (HCC)    Dysrhythmia    A-fib   Endometrial hyperplasia 01/03/2015   GERD (gastroesophageal reflux disease)    History of kidney stones    Hyperlipidemia    Hypertension    Pneumonia    PONV (postoperative nausea and vomiting)    slow to wake up   Thickened endometrium 01/03/2015   Vaginal delivery 1976, 1985    Past  Surgical History:  Procedure Laterality Date   ANKLE FRACTURE SURGERY     ATRIAL FIBRILLATION ABLATION N/A 02/08/2022   Procedure: ATRIAL FIBRILLATION ABLATION;  Surgeon: Boyce Byes, MD;  [facility]: [facility];  Service: Cardiovascular;  Laterality: N/A;   CARDIOVERSION N/A 02/17/2023   Procedure: CARDIOVERSION;  Surgeon: Jerryl Morin, DO;  Location: [facility];  Service: Cardiovascular;  Laterality: N/A;   CATARACT EXTRACTION Right 2020   DILATION AND CURETTAGE OF UTERUS  2012   ablation    EYE SURGERY     Corneal growth removal on right eye   HYSTEROSCOPY WITH D & C N/A 01/03/2015   Procedure: DILATATION AND CURETTAGE /HYSTEROSCOPY;  Surgeon: Terri Fester, MD;  Location: [facility];  Service: Gynecology;  Laterality: N/A;   LAPAROSCOPY     for endometriosis   ROBOTIC ASSISTED TOTAL HYSTERECTOMY WITH BILATERAL SALPINGO OOPHERECTOMY N/A 03/17/2021   Procedure: XI ROBOTIC ASSISTED TOTAL HYSTERECTOMY WITH BILATERAL SALPINGO OOPHORECTOMY;  Surgeon: Suzi Essex, MD;  Location: [facility];  Service: Gynecology;  Laterality: N/A;   SENTINEL NODE BIOPSY N/A 03/17/2021   Procedure: SENTINEL NODE BIOPSY;  Surgeon: Suzi Essex, MD;  Location: [facility];  Service: Gynecology;  Laterality: N/A;   TONSILLECTOMY AND ADENOIDECTOMY      Family History  Problem Relation Age of Onset   Heart disease Mother 5       CABG   Stroke Father    Prostate cancer Father    Heart disease Brother 12       Transplant, died 10 years   Heart disease Maternal Grandmother    Heart disease Paternal Grandmother    Cancer Paternal Grandfather    Heart disease Brother 16       RF   Leukemia Brother    Leukemia Brother    Hypertension Sister    Cervical cancer Sister    Colon cancer Neg Hx    Breast cancer Neg Hx    Ovarian cancer Neg Hx    Endometrial cancer Neg Hx    Pancreatic cancer Neg Hx     Social History   Socioeconomic History   Marital status: Widowed    Spouse name:  Audrene Blessing   Number of children: 2   Years of education: 14   Highest education level: Not on file  Occupational History   Occupation: retired    Comment: [employer]  Tobacco Use   Smoking status: Never   Smokeless tobacco: Never   Tobacco comments:    Never smoke 03/02/23  Vaping Use   Vaping status: Never Used  Substance and Sexual Activity  Alcohol  use: No    Alcohol /week: 0.0 standard drinks of alcohol    Drug use: No   Sexual activity: Yes    Birth control/protection: Post-menopausal  Other Topics Concern   Not on file  Social History Narrative   Lives with her younger daughter, [name]. Her older daughter lives in [place] with her family.   Social Drivers of Corporate investment banker Strain: Not on file  Food Insecurity: No Food Insecurity (04/10/2024)   Hunger Vital Sign    Worried About Running Out of Food in the Last Year: Never true    Ran Out of Food in the Last Year: Never true  Transportation Needs: No Transportation Needs (04/10/2024)   PRAPARE - Administrator, Civil Service (Medical): No    Lack of Transportation (Non-Medical): No  Physical Activity: Not on file  Stress: Not on file  Social Connections: Not on file    Current Medications:  Current Outpatient Medications:    acetaminophen  (TYLENOL ) 500 MG tablet, Take 1,000 mg by mouth every 6 (six) hours as needed (Arthritis Pain)., Disp: , Rfl:    ALPRAZolam (XANAX) 0.5 MG tablet, Take 0.25 mg by mouth daily as needed (anxiousness)., Disp: , Rfl:    apixaban  (ELIQUIS ) 5 MG TABS tablet, Take 1 tablet (5 mg total) by mouth 2 (two) times daily., Disp: 180 tablet, Rfl: 3   Calcium  Carb-Cholecalciferol 600-3.125 MG-MCG TABS, , Disp: , Rfl:    Cholecalciferol (VITAMIN D ) 50 MCG (2000 UT) CAPS, Take 2,000 Units by mouth at bedtime., Disp: , Rfl:    estradiol  (VIVELLE -DOT) 0.025 MG/24HR, PLACE 1 PATCH ONTO THE SKIN 2 (TWO) TIMES A WEEK. SUNDAYS & WEDNESDAYS, Disp: 8 patch, Rfl: 0   metoprolol  tartrate  (LOPRESSOR ) 50 MG tablet, TAKE ONE TABLET BY MOUTH TWICE A DAY, Disp: 180 tablet, Rfl: 3   olmesartan (BENICAR) 40 MG tablet, Take 40 mg by mouth daily., Disp: , Rfl:    Polyethyl Glycol-Propyl Glycol (SYSTANE OP), Place 1 drop into both eyes daily., Disp: , Rfl:    rosuvastatin  (CRESTOR ) 20 MG tablet, Take 1 tablet (20 mg total) by mouth every evening., Disp: 90 tablet, Rfl: 3  Review of Systems: Denies appetite changes, fevers, chills, fatigue, unexplained weight changes. Denies hearing loss, neck lumps or masses, mouth sores, ringing in ears or voice changes. Denies cough or wheezing.  Denies shortness of breath. Denies chest pain or palpitations. Denies leg swelling. Denies abdominal pain, blood in stools, constipation, diarrhea, nausea, vomiting, or early satiety. Denies pain with intercourse, dysuria, frequency, hematuria or incontinence. Denies hot flashes, pelvic pain, vaginal bleeding or vaginal discharge.   Denies joint pain or muscle pain/cramps. Denies itching, rash, or wounds. Denies dizziness, headaches, numbness or seizures. Denies swollen lymph nodes or glands, denies easy bruising or bleeding. Denies anxiety, depression, confusion, or decreased concentration.  Physical Exam: BP 120/66 (BP Location: Left Arm, Patient Position: Sitting)   Pulse 74   Temp 98.3 F (36.8 C) (Oral)   Resp 16   Ht 5' 3 (1.6 m)   Wt 163 lb 3.2 oz (74 kg)   SpO2 97%   BMI 28.91 kg/m  General: Alert, oriented, no acute distress. HEENT: Normocephalic, atraumatic, sclera anicteric. Chest: Lungs clear to auscultation bilaterally without wheezes or rhonchi. Cardiovascular: Regular rate and rhythm, no murmurs. Abdomen: soft, nontender.  Normoactive bowel sounds.  No masses or hepatosplenomegaly appreciated.  Well-healed incisions. Extremities: Grossly normal range of motion.  Warm, well perfused.  No edema bilaterally.  Skin: No rashes or lesions noted. Lymphatics: No cervical, supraclavicular,  or inguinal adenopathy. GU: Normal appearing external genitalia without erythema, excoriation, or lesions.  Stable small caruncle.  Mildly atrophic vaginal mucosa, no lesions or masses seen, no discharge or bleeding. Bimanual exam reveals cuff intact no nodularity or masses.  Rectovaginal exam confirms these findings.  Laboratory & Radiologic Studies: Recent labs from 04/2024 as part of workup with Dr. Salomon Cree  Assessment & Plan: Mary Potter is a 78 y.o. 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 Definitive surgery in 03/2021. MMRp.  Patient is overall doing quite well and is NED on exam today.    Support given for all that has gone on medically with her since her last visit.    Low-dose estrogen had been started in the setting of her cardiac symptoms.  She is now doing well from a cardiac standpoint after ablation last year and cardioversion earlier this year. Risks and benefits of continuing low-dose estrogen discussed previously with Dr.Tenleigh Byer.    We will continue to alternate visits q 6 months between my office and her OB/GYN.  I have asked her to reach out to schedule a visit with her OB/GYN in 6 months.  We will see her back in 1 year.  We reviewed signs and symptoms that would be concerning for cancer recurrence.  20 minutes of total time was spent for this patient encounter, including preparation, face-to-face counseling with the patient and coordination of care, and documentation of the encounter.  Wiley Hanger, MD  Division of Gynecologic Oncology  Department of Obstetrics and Gynecology  [facility]

## 2024-04-30 ENCOUNTER — Encounter (HOSPITAL_COMMUNITY)
Admission: RE | Admit: 2024-04-30 | Discharge: 2024-04-30 | Disposition: A | Discharge status: Home / Self Care | Source: Ambulatory Visit | Attending: Hematology | Admitting: Hematology

## 2024-04-30 DIAGNOSIS — K449 Diaphragmatic hernia without obstruction or gangrene: Secondary | ICD-10-CM | POA: Insufficient documentation

## 2024-04-30 DIAGNOSIS — K8020 Calculus of gallbladder without cholecystitis without obstruction: Secondary | ICD-10-CM | POA: Diagnosis not present

## 2024-04-30 DIAGNOSIS — M898X2 Other specified disorders of bone, upper arm: Secondary | ICD-10-CM | POA: Insufficient documentation

## 2024-04-30 DIAGNOSIS — K5730 Diverticulosis of large intestine without perforation or abscess without bleeding: Secondary | ICD-10-CM | POA: Diagnosis not present

## 2024-04-30 DIAGNOSIS — D472 Monoclonal gammopathy: Secondary | ICD-10-CM | POA: Diagnosis not present

## 2024-04-30 DIAGNOSIS — C9000 Multiple myeloma not having achieved remission: Secondary | ICD-10-CM | POA: Diagnosis not present

## 2024-04-30 LAB — GLUCOSE, CAPILLARY: Glucose-Capillary: 112 mg/dL — ABNORMAL HIGH (ref 70–99)

## 2024-04-30 MED ORDER — FLUDEOXYGLUCOSE F - 18 (FDG) INJECTION
8.0900 | Freq: Once | INTRAVENOUS | Status: AC
  Administered 2024-04-30: 8.09 via INTRAVENOUS

## 2024-05-01 ENCOUNTER — Other Ambulatory Visit (HOSPITAL_COMMUNITY): Payer: Self-pay

## 2024-05-11 ENCOUNTER — Other Ambulatory Visit: Payer: Self-pay | Admitting: Radiology

## 2024-05-11 DIAGNOSIS — D472 Monoclonal gammopathy: Secondary | ICD-10-CM

## 2024-05-11 NOTE — H&P (Signed)
 Chief Complaint: IgA kappa biclonal gammopathy; referred for image guided bone marrow biopsy for further evaluation  Referring Provider(s): Kale,G  Supervising Physician: Vanice Revel  Patient Status: [facility] - Out-pt  History of Present Illness: Mary Potter is a 78 y.o. female with past medical history significant for endometrioid adenocarcinoma 2022, prior kyphoplasty, atrial fibrillation, GERD, nephrolithiasis, hypertension, hyperlipidemia presenting now with IgA kappa biclonal gammopathy.  She is scheduled today for CT-guided bone marrow biopsy for further evaluation/rule out myeloma.   Patient is Full Code  Past Medical History:  Diagnosis Date   Atrial fibrillation (HCC)    Dysrhythmia    A-fib   Endometrial hyperplasia 01/03/2015   GERD (gastroesophageal reflux disease)    History of kidney stones    Hyperlipidemia    Hypertension    Pneumonia    PONV (postoperative nausea and vomiting)    slow to wake up   Thickened endometrium 01/03/2015   Vaginal delivery 1976, 1985    Past Surgical History:  Procedure Laterality Date   ANKLE FRACTURE SURGERY     ATRIAL FIBRILLATION ABLATION N/A 02/08/2022   Procedure: ATRIAL FIBRILLATION ABLATION;  Surgeon: Cindie Ole DASEN, MD;  Location: [facility];  Service: Cardiovascular;  Laterality: N/A;   CARDIOVERSION N/A 02/17/2023   Procedure: CARDIOVERSION;  Surgeon: Sheena Pugh, DO;  Location: [facility];  Service: Cardiovascular;  Laterality: N/A;   CATARACT EXTRACTION Right 2020   DILATION AND CURETTAGE OF UTERUS  2012   ablation    EYE SURGERY     Corneal growth removal on right eye   HYSTEROSCOPY WITH D & C N/A 01/03/2015   Procedure: DILATATION AND CURETTAGE /HYSTEROSCOPY;  Surgeon: Robbi Render, MD;  Location: [facility];  Service: Gynecology;  Laterality: N/A;   LAPAROSCOPY     for endometriosis   ROBOTIC ASSISTED TOTAL HYSTERECTOMY WITH BILATERAL SALPINGO OOPHERECTOMY N/A 03/17/2021   Procedure: XI  ROBOTIC ASSISTED TOTAL HYSTERECTOMY WITH BILATERAL SALPINGO OOPHORECTOMY;  Surgeon: Viktoria Comer SAUNDERS, MD;  Location: [facility];  Service: Gynecology;  Laterality: N/A;   SENTINEL NODE BIOPSY N/A 03/17/2021   Procedure: SENTINEL NODE BIOPSY;  Surgeon: Viktoria Comer SAUNDERS, MD;  Location: [facility];  Service: Gynecology;  Laterality: N/A;   TONSILLECTOMY AND ADENOIDECTOMY      Allergies: Talwin [pentazocine], Hydrocodone, and Prednisone   Medications: Prior to Admission medications   Medication Sig Start Date End Date Taking? Authorizing Provider  acetaminophen  (TYLENOL ) 500 MG tablet Take 1,000 mg by mouth every 6 (six) hours as needed (Arthritis Pain).    [provider]  ALPRAZolam (XANAX) 0.5 MG tablet Take 0.25 mg by mouth daily as needed (anxiousness). 08/26/21   [provider]  apixaban  (ELIQUIS ) 5 MG TABS tablet Take 1 tablet (5 mg total) by mouth 2 (two) times daily. 03/02/23   Terra Fairy PARAS, PA-C  Calcium  Carb-Cholecalciferol 600-3.125 MG-MCG TABS  09/16/23   [provider]  Cholecalciferol (VITAMIN D ) 50 MCG (2000 UT) CAPS Take 2,000 Units by mouth at bedtime.    [provider]  estradiol  (VIVELLE -DOT) 0.025 MG/24HR PLACE 1 PATCH ONTO THE SKIN 2 (TWO) TIMES A WEEK. SUNDAYS & [facility] 03/08/22   Cross, Melissa D, NP  metoprolol  tartrate (LOPRESSOR ) 50 MG tablet TAKE ONE TABLET BY MOUTH TWICE A DAY 05/23/23   Lavona Agent, MD  olmesartan (BENICAR) 40 MG tablet Take 40 mg by mouth daily.    [provider]  Polyethyl Glycol-Propyl Glycol (SYSTANE OP) Place 1 drop into both  eyes daily.    [provider]  rosuvastatin  (CRESTOR ) 20 MG tablet Take 1 tablet (20 mg total) by mouth every evening. 05/17/22   Cindie Ole DASEN, MD     Family History  Problem Relation Age of Onset   Heart disease Mother 50       CABG   Stroke Father    Prostate cancer Father    Heart disease Brother 63       Transplant, died 10 years   Heart disease  Maternal Grandmother    Heart disease Paternal Grandmother    Cancer Paternal Grandfather    Heart disease Brother 73       RF   Leukemia Brother    Leukemia Brother    Hypertension Sister    Cervical cancer Sister    Colon cancer Neg Hx    Breast cancer Neg Hx    Ovarian cancer Neg Hx    Endometrial cancer Neg Hx    Pancreatic cancer Neg Hx     Social History   Socioeconomic History   Marital status: Widowed    Spouse name: Sharolyn   Number of children: 2   Years of education: 14   Highest education level: Not on file  Occupational History   Occupation: retired    Comment: [employer]  Tobacco Use   Smoking status: Never   Smokeless tobacco: Never   Tobacco comments:    Never smoke 03/02/23  Vaping Use   Vaping status: Never Used  Substance and Sexual Activity   Alcohol  use: No    Alcohol /week: 0.0 standard drinks of alcohol    Drug use: No   Sexual activity: Yes    Birth control/protection: Post-menopausal  Other Topics Concern   Not on file  Social History Narrative   Lives with her younger daughter, [name]. Her older daughter lives in [place] with her family.   Social Drivers of Corporate investment banker Strain: Not on file  Food Insecurity: No Food Insecurity (04/10/2024)   Hunger Vital Sign    Worried About Running Out of Food in the Last Year: Never true    Ran Out of Food in the Last Year: Never true  Transportation Needs: No Transportation Needs (04/10/2024)   PRAPARE - Administrator, Civil Service (Medical): No    Lack of Transportation (Non-Medical): No  Physical Activity: Not on file  Stress: Not on file  Social Connections: Not on file       Review of Systems currently done as fever, headache, chest pain, dyspnea, cough, abdominal pain, nausea, vomiting or bleeding.  She does have some chronic back pain  Vital Signs: Vitals:   05/14/24 0941  BP: (!) 148/83  Pulse: 84  Resp: 16  Temp: 97.8 F (36.6 C)  SpO2: 96%        Advance Care Plan: No documents on file  Physical Exam: Awake, alert.  Chest clear to auscultation bilaterally.  Heart with normal rate, irregular rhythm.  Abdomen soft, positive bowel sounds, nontender.  No lower extremity edema.  Imaging: NM PET Image Initial (PI) Skull Base To Thigh Result Date: 05/01/2024 CLINICAL DATA:  Initial treatment strategy for myeloma. EXAM: NUCLEAR MEDICINE PET SKULL BASE TO THIGH TECHNIQUE: 8.09 mCi F-18 FDG was injected intravenously. Full-ring PET imaging was performed from the skull base to thigh after the radiotracer. CT data was obtained and used for attenuation correction and anatomic localization. Fasting blood glucose: 116 mg/dl COMPARISON:  Lumbar spine CT 08/19/2023.  Abdomen pelvis CT with contrast 09/12/2020. skeletal bone survey 08/15/2023. FINDINGS: Mediastinal blood pool activity: SUV max 2.7 Liver activity: SUV max 3.1 NECK: No specific abnormal uptake identified in the neck including along lymph node change of the submandibular, posterior triangle or internal jugular region. Near symmetric uptake of the visualized intracranial compartment. Incidental CT findings: The parotid glands, submandibular glands and thyroid  gland are unremarkable. Significant streak artifact related to the patient's dental hardware. Paranasal sinuses and mastoid air cells are clear. Right middle turbinate concha bullosa. CHEST: No specific abnormal uptake identified above blood pool in the axillary regions, hilum or mediastinum. No abnormal lung parenchymal uptake. Incidental CT findings: Small hiatal hernia. Slightly patulous thoracic esophagus. No pericardial effusion. Scattered vascular calcifications are seen including along the coronary arteries. The thoracic aorta has a normal course and caliber. Scattered calcified plaque. Breathing motion seen throughout the examination with some mild scattered ground-glass identified along the lower lung zones, nonspecific. No  consolidation, pneumothorax or effusion. There is some middle lobe calcified nodules identified on image 72 of the CT scan, series 4. ABDOMEN/PELVIS: There is physiologic distribution radiotracer along the parenchymal organs, bowel and renal collecting systems. Specifically no abnormal uptake along the spleen. Splenic length approaches 10.4 cm. No abnormal uptake along lymph node chains in the abdomen and pelvis. Incidental CT findings: On this limited noncontrast examination, the liver, adrenal glands unremarkable. There is some fatty atrophy of the pancreas. Stone in the gallbladder. No abnormal calcifications seen within either kidney nor along the course of either ureter. Underdistended urinary bladder. Large bowel has a normal course and caliber. Left-sided colonic diverticula. Scattered colonic stool. Stomach and small bowel are nondilated. SKELETON: There is some mild uptake seen in the spine in the area of compression deformity non additions cement at T12 through L2. There is some minimal uptake along the proximal right humeral metaphyseal shaft with maximum SUV of 3.0. No erosive changes along the bone in this location. Nonspecific. No additional areas of abnormal uptake along the visualized osseous structures. Incidental CT findings: Diffuse degenerative changes identified. IMPRESSION: No specific abnormal radiotracer uptake identified. This includes along lymph node chains, spleen. No splenomegaly. Foley slight asymmetric uptake along the proximal right humeral shaft of maximum SUV of 3.0, nonspecific. This is in a different location than the lucent lesion seen on the bone survey October 2024. Augmentation cement seen at the spine at T12 through L2. Colonic diverticula. Gallstone. Hiatal hernia. Electronically Signed   By: Ranell Bring M.D.   On: 05/01/2024 16:57    Labs:  CBC: Recent Labs    08/19/23 0923 11/22/23 1329 03/21/24 1402 04/04/24 1111 04/04/24 1112  WBC 8.5 5.5 4.4 3.6*  --    HGB 11.9* 11.7* 10.2* 10.4*  --   HCT 36.6 35.3* 30.8* 32.3* 33.0*  PLT 262 246 227 240  --     COAGS: No results for input(s): INR, APTT in the last 8760 hours.  BMP: Recent Labs    08/04/23 1240 08/19/23 0923 11/22/23 1329 03/21/24 1402  NA 137 137 139 138  K 4.1 3.8 4.0 4.1  CL 103 104 104 104  CO2 27 21* 29 30  GLUCOSE 110* 134* 108* 120*  BUN 14 10 19 17   CALCIUM  10.2 8.4* 10.0 9.6  CREATININE 0.65 0.71 0.81 0.71  GFRNONAA >60 >60 >60 >60    LIVER FUNCTION TESTS: Recent Labs    07/23/23 1149 08/04/23 1240 11/22/23 1329 03/21/24 1402  BILITOT 0.5 0.4 0.4 0.5  AST  14* 16 15 18   ALT 11 14 10 16   ALKPHOS 60 73 41 41  PROT 7.8 8.0 7.8 8.0  ALBUMIN 4.1 4.3 4.2 4.4    TUMOR MARKERS: No results for input(s): AFPTM, CEA, CA199, CHROMGRNA in the last 8760 hours.  Assessment and Plan: 78 y.o. female with past medical history significant for endometrioid adenocarcinoma 2022 , prior kyphoplasty, atrial fibrillation, GERD, nephrolithiasis, hypertension, hyperlipidemia presenting now with IgA kappa biclonal gammopathy.  She is scheduled today for CT-guided bone marrow biopsy for further evaluation/rule out myeloma.Risks and benefits of procedure was discussed with the patient  including, but not limited to bleeding, infection, damage to adjacent structures or low yield requiring additional tests.  All of the questions were answered and there is agreement to proceed.  Consent signed and in chart.    Thank you for allowing our service to participate in NADEGE CARRIGER 's care.  Electronically Signed: D. Franky Rakers, PA-C   05/11/2024, 4:44 PM      I spent a total of  15 Minutes   in face to face in clinical consultation, greater than 50% of which was counseling/coordinating care for image guided bone marrow biopsy

## 2024-05-14 ENCOUNTER — Ambulatory Visit (HOSPITAL_COMMUNITY)
Admission: RE | Admit: 2024-05-14 | Discharge: 2024-05-14 | Disposition: A | Discharge status: Home / Self Care | Source: Ambulatory Visit | Attending: Hematology | Admitting: Hematology

## 2024-05-14 ENCOUNTER — Encounter (HOSPITAL_COMMUNITY): Payer: Self-pay

## 2024-05-14 ENCOUNTER — Other Ambulatory Visit: Payer: Self-pay

## 2024-05-14 DIAGNOSIS — Z1379 Encounter for other screening for genetic and chromosomal anomalies: Secondary | ICD-10-CM | POA: Insufficient documentation

## 2024-05-14 DIAGNOSIS — Z8542 Personal history of malignant neoplasm of other parts of uterus: Secondary | ICD-10-CM | POA: Diagnosis not present

## 2024-05-14 DIAGNOSIS — R896 Abnormal cytological findings in specimens from other organs, systems and tissues: Secondary | ICD-10-CM | POA: Diagnosis not present

## 2024-05-14 DIAGNOSIS — E785 Hyperlipidemia, unspecified: Secondary | ICD-10-CM | POA: Insufficient documentation

## 2024-05-14 DIAGNOSIS — C9000 Multiple myeloma not having achieved remission: Secondary | ICD-10-CM | POA: Diagnosis not present

## 2024-05-14 DIAGNOSIS — I10 Essential (primary) hypertension: Secondary | ICD-10-CM | POA: Insufficient documentation

## 2024-05-14 DIAGNOSIS — D472 Monoclonal gammopathy: Secondary | ICD-10-CM | POA: Diagnosis not present

## 2024-05-14 DIAGNOSIS — I4891 Unspecified atrial fibrillation: Secondary | ICD-10-CM | POA: Diagnosis not present

## 2024-05-14 LAB — CBC WITH DIFFERENTIAL/PLATELET
Abs Immature Granulocytes: 0.01 K/uL (ref 0.00–0.07)
Basophils Absolute: 0 K/uL (ref 0.0–0.1)
Basophils Relative: 0 %
Eosinophils Absolute: 0.2 K/uL (ref 0.0–0.5)
Eosinophils Relative: 4 %
HCT: 33 % — ABNORMAL LOW (ref 36.0–46.0)
Hemoglobin: 10.7 g/dL — ABNORMAL LOW (ref 12.0–15.0)
Immature Granulocytes: 0 %
Lymphocytes Relative: 29 %
Lymphs Abs: 1.3 K/uL (ref 0.7–4.0)
MCH: 31.1 pg (ref 26.0–34.0)
MCHC: 32.4 g/dL (ref 30.0–36.0)
MCV: 95.9 fL (ref 80.0–100.0)
Monocytes Absolute: 0.3 K/uL (ref 0.1–1.0)
Monocytes Relative: 6 %
Neutro Abs: 2.8 K/uL (ref 1.7–7.7)
Neutrophils Relative %: 61 %
Platelets: 230 K/uL (ref 150–400)
RBC: 3.44 MIL/uL — ABNORMAL LOW (ref 3.87–5.11)
RDW: 14.1 % (ref 11.5–15.5)
WBC: 4.6 K/uL (ref 4.0–10.5)
nRBC: 0 % (ref 0.0–0.2)

## 2024-05-14 MED ORDER — FENTANYL CITRATE (PF) 100 MCG/2ML IJ SOLN
INTRAMUSCULAR | Status: AC | PRN
  Administered 2024-05-14: 50 ug via INTRAVENOUS

## 2024-05-14 MED ORDER — NALOXONE HCL 0.4 MG/ML IJ SOLN
INTRAMUSCULAR | Status: AC
  Filled 2024-05-14: qty 1

## 2024-05-14 MED ORDER — MIDAZOLAM HCL 2 MG/2ML IJ SOLN
INTRAMUSCULAR | Status: AC | PRN
  Administered 2024-05-14: 1 mg via INTRAVENOUS

## 2024-05-14 MED ORDER — MIDAZOLAM HCL 2 MG/2ML IJ SOLN
INTRAMUSCULAR | Status: AC
  Filled 2024-05-14: qty 2

## 2024-05-14 MED ORDER — FENTANYL CITRATE (PF) 100 MCG/2ML IJ SOLN
INTRAMUSCULAR | Status: AC
  Filled 2024-05-14: qty 2

## 2024-05-14 MED ORDER — SODIUM CHLORIDE 0.9 % IV SOLN: INTRAVENOUS | Status: DC

## 2024-05-14 MED ORDER — FLUMAZENIL 0.5 MG/5ML IV SOLN
INTRAVENOUS | Status: AC
  Filled 2024-05-14: qty 5

## 2024-05-14 MED ORDER — LIDOCAINE HCL 1 % IJ SOLN
INTRAMUSCULAR | Status: AC | PRN
  Administered 2024-05-14: 10 mL via INTRADERMAL

## 2024-05-14 NOTE — Procedures (Signed)
 Interventional Radiology Procedure Note  Procedure: CT BM ASP AND CORE BX    Complications: None  Estimated Blood Loss:  MIN  Findings: 11 G CORE AND BX    EMERSON FREDERIC SPECKING, MD

## 2024-05-14 NOTE — Discharge Instructions (Signed)

## 2024-05-15 ENCOUNTER — Other Ambulatory Visit: Payer: Self-pay

## 2024-05-15 DIAGNOSIS — D472 Monoclonal gammopathy: Secondary | ICD-10-CM

## 2024-05-16 ENCOUNTER — Inpatient Hospital Stay (HOSPITAL_BASED_OUTPATIENT_CLINIC_OR_DEPARTMENT_OTHER): Admitting: Hematology

## 2024-05-16 ENCOUNTER — Inpatient Hospital Stay: Attending: Hematology

## 2024-05-16 VITALS — BP 138/74 | HR 74 | Temp 97.0°F | Resp 20 | Wt 160.7 lb

## 2024-05-16 DIAGNOSIS — D649 Anemia, unspecified: Secondary | ICD-10-CM

## 2024-05-16 DIAGNOSIS — Z79899 Other long term (current) drug therapy: Secondary | ICD-10-CM | POA: Diagnosis not present

## 2024-05-16 DIAGNOSIS — D472 Monoclonal gammopathy: Secondary | ICD-10-CM | POA: Insufficient documentation

## 2024-05-16 LAB — CBC WITH DIFFERENTIAL (CANCER CENTER ONLY)
Abs Immature Granulocytes: 0.01 K/uL (ref 0.00–0.07)
Basophils Absolute: 0 K/uL (ref 0.0–0.1)
Basophils Relative: 0 %
Eosinophils Absolute: 0.1 K/uL (ref 0.0–0.5)
Eosinophils Relative: 2 %
HCT: 32.8 % — ABNORMAL LOW (ref 36.0–46.0)
Hemoglobin: 10.9 g/dL — ABNORMAL LOW (ref 12.0–15.0)
Immature Granulocytes: 0 %
Lymphocytes Relative: 24 %
Lymphs Abs: 1.2 K/uL (ref 0.7–4.0)
MCH: 31 pg (ref 26.0–34.0)
MCHC: 33.2 g/dL (ref 30.0–36.0)
MCV: 93.2 fL (ref 80.0–100.0)
Monocytes Absolute: 0.3 K/uL (ref 0.1–1.0)
Monocytes Relative: 6 %
Neutro Abs: 3.3 K/uL (ref 1.7–7.7)
Neutrophils Relative %: 68 %
Platelet Count: 245 K/uL (ref 150–400)
RBC: 3.52 MIL/uL — ABNORMAL LOW (ref 3.87–5.11)
RDW: 14.2 % (ref 11.5–15.5)
WBC Count: 4.9 K/uL (ref 4.0–10.5)
nRBC: 0 % (ref 0.0–0.2)

## 2024-05-16 LAB — CMP (CANCER CENTER ONLY)
ALT: 13 U/L (ref 0–44)
AST: 17 U/L (ref 15–41)
Albumin: 4.3 g/dL (ref 3.5–5.0)
Alkaline Phosphatase: 43 U/L (ref 38–126)
Anion gap: 6 (ref 5–15)
BUN: 22 mg/dL (ref 8–23)
CO2: 28 mmol/L (ref 22–32)
Calcium: 10.2 mg/dL (ref 8.9–10.3)
Chloride: 103 mmol/L (ref 98–111)
Creatinine: 0.83 mg/dL (ref 0.44–1.00)
GFR, Estimated: >60 mL/min (ref >60)
Glucose, Bld: 116 mg/dL — ABNORMAL HIGH (ref 70–99)
Potassium: 4.1 mmol/L (ref 3.5–5.1)
Sodium: 137 mmol/L (ref 135–145)
Total Bilirubin: 0.4 mg/dL (ref 0.0–1.2)
Total Protein: 8.4 g/dL — ABNORMAL HIGH (ref 6.5–8.1)

## 2024-05-16 LAB — SURGICAL PATHOLOGY

## 2024-05-16 NOTE — Progress Notes (Signed)
 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  Today, she is accompanied by her daughter and son-in-law. Patient has had two vertebral compression fractures in her L-spine. She reports that her initial back fracture was from pulling too hard while adjusting her shoes, and the second occurred after pushing on a trash can lid.   Her back pain continues to be bothersome, though it has improved. She did previously have mild pain raidating to her left lower extremity, which has resolved. Back brace does improve back pain.  The first time she had a compression fracture, she was told that she was developing the beginnings of osteopenia. Patient reports that she did not tolerate Fosamax and she stopped taking it. She has not had a bone density study recently. Her last bone density study from 2018 showed osteopenia. Patient has never been on prolia  previously, though there are considerations to start it soon.   She reports having a muscle spasm on her left side, which is settling with muscle relaxants. She denies any leg swelling.   Patient reports a history of kidney stones, during which she had protein in her urine.  Patient has regularly been on vitamin D . She has  been off of calcium  for several years ago because of her fhx of heart disease.   INTERVAL HISTORY:  Mary Potter is a wonderful 78 y.o. 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   Patient was last seen by me on 04/04/2024 and complained of lightheadedness when standing too quickly, mild SOB on exertion, and a couple occasions of mild blood when wiping.  Patient is accompanied by her husband during today's visit. She reports no new concerns since her last clinical visit. Patient denies any new localized bone pains, change in energy levels, unexplained fever, chills, night sweats, unexpected weight loss, infection issues, back pain, abdominal pain, change in bowel habits, or other new localized bone pains.   Patient was noted to have received a Bone density study on 10/03/2023, which showed findings of osteoporosis. She reports that she is currently on prolia  for osteoporosis.   She reports that all of her other medical issues have been stable.   Besides vitamin D  supplementation, she does not take any other vitamins on a regular basis. She does generally have a fairly balanced diet with plenty of fruits and vegetables.   She does take Nexium once in the mornings due to constant heart burn. Patient notes that she does take Tums if her symptoms are significant.   MEDICAL HISTORY:  Past Medical History:  Diagnosis Date   Atrial fibrillation (HCC)    Dysrhythmia    A-fib   Endometrial hyperplasia 01/03/2015   GERD (gastroesophageal reflux disease)    History of kidney stones    Hyperlipidemia    Hypertension    Pneumonia    PONV (postoperative  nausea and vomiting)    slow to wake up   Thickened endometrium 01/03/2015   Vaginal delivery 1976, 1985    SURGICAL HISTORY: Past Surgical History:  Procedure Laterality Date   ABDOMINAL HYSTERECTOMY     ANKLE FRACTURE SURGERY     ATRIAL FIBRILLATION ABLATION N/A 02/08/2022   Procedure: ATRIAL  FIBRILLATION ABLATION;  Surgeon: Cindie Ole DASEN, MD;  Location: [facility];  Service: Cardiovascular;  Laterality: N/A;   CARDIOVERSION N/A 02/17/2023   Procedure: CARDIOVERSION;  Surgeon: Sheena Pugh, DO;  Location: [facility];  Service: Cardiovascular;  Laterality: N/A;   CATARACT EXTRACTION Right 2020   DILATION AND CURETTAGE OF UTERUS  2012   ablation    EYE SURGERY     Corneal growth removal on right eye   HYSTEROSCOPY WITH D & C N/A 01/03/2015   Procedure: DILATATION AND CURETTAGE /HYSTEROSCOPY;  Surgeon: Robbi Render, MD;  Location: [facility];  Service: Gynecology;  Laterality: N/A;   LAPAROSCOPY     for endometriosis   ROBOTIC ASSISTED TOTAL HYSTERECTOMY WITH BILATERAL SALPINGO OOPHERECTOMY N/A 03/17/2021   Procedure: XI ROBOTIC ASSISTED TOTAL HYSTERECTOMY WITH BILATERAL SALPINGO OOPHORECTOMY;  Surgeon: Viktoria Comer SAUNDERS, MD;  Location: [facility];  Service: Gynecology;  Laterality: N/A;   SENTINEL NODE BIOPSY N/A 03/17/2021   Procedure: SENTINEL NODE BIOPSY;  Surgeon: Viktoria Comer SAUNDERS, MD;  Location: [facility];  Service: Gynecology;  Laterality: N/A;   TONSILLECTOMY AND ADENOIDECTOMY      SOCIAL HISTORY: Social History   Socioeconomic History   Marital status: Widowed    Spouse name: Sharolyn   Number of children: 2   Years of education: 14   Highest education level: Not on file  Occupational History   Occupation: retired    Comment: [employer]  Tobacco Use   Smoking status: Never    Passive exposure: Past   Smokeless tobacco: Never   Tobacco comments:    Never smoke 03/02/23  Vaping Use   Vaping status: Never Used  Substance and Sexual Activity   Alcohol  use: No    Alcohol /week: 0.0 standard drinks of alcohol    Drug use: No   Sexual activity: Yes    Birth control/protection: Other-see comments    Comment: Hysterectomy  Other Topics Concern   Not on file  Social History Narrative   Lives with her younger daughter, [name]. Her older daughter lives in  [place] with her family.   Social Drivers of Corporate investment banker Strain: Not on file  Food Insecurity: No Food Insecurity (04/10/2024)   Hunger Vital Sign    Worried About Running Out of Food in the Last Year: Never true    Ran Out of Food in the Last Year: Never true  Transportation Needs: No Transportation Needs (04/10/2024)   PRAPARE - Administrator, Civil Service (Medical): No    Lack of Transportation (Non-Medical): No  Physical Activity: Not on file  Stress: Not on file  Social Connections: Not on file  Intimate Partner Violence: Not At Risk (04/10/2024)   Humiliation, Afraid, Rape, and Kick questionnaire    Fear of Current or Ex-Partner: No    Emotionally Abused: No    Physically Abused: No    Sexually Abused: No    FAMILY HISTORY: Family History  Problem Relation Age of Onset   Heart disease Mother 62       CABG   Stroke Father    Prostate cancer Father    Heart  disease Brother 45       Transplant, died 10 years   Heart disease Maternal Grandmother    Heart disease Paternal Grandmother    Cancer Paternal Grandfather    Heart disease Brother 48       RF   Leukemia Brother    Leukemia Brother    Hypertension Sister    Cervical cancer Sister    Colon cancer Neg Hx    Breast cancer Neg Hx    Ovarian cancer Neg Hx    Endometrial cancer Neg Hx    Pancreatic cancer Neg Hx     ALLERGIES:  is allergic to talwin [pentazocine], hydrocodone, and prednisone .  MEDICATIONS:  Current Outpatient Medications  Medication Sig Dispense Refill   acetaminophen  (TYLENOL ) 500 MG tablet Take 1,000 mg by mouth every 6 (six) hours as needed (Arthritis Pain).     ALPRAZolam (XANAX) 0.5 MG tablet Take 0.25 mg by mouth daily as needed (anxiousness).     apixaban  (ELIQUIS ) 5 MG TABS tablet Take 1 tablet (5 mg total) by mouth 2 (two) times daily. 180 tablet 3   Calcium  Carb-Cholecalciferol 600-3.125 MG-MCG TABS      Cholecalciferol (VITAMIN D ) 50 MCG (2000 UT)  CAPS Take 2,000 Units by mouth at bedtime.     estradiol  (VIVELLE -DOT) 0.025 MG/24HR PLACE 1 PATCH ONTO THE SKIN 2 (TWO) TIMES A WEEK. SUNDAYS & WEDNESDAYS 8 patch 0   metoprolol  tartrate (LOPRESSOR ) 50 MG tablet TAKE ONE TABLET BY MOUTH TWICE A DAY 180 tablet 3   olmesartan (BENICAR) 40 MG tablet Take 40 mg by mouth daily.     Polyethyl Glycol-Propyl Glycol (SYSTANE OP) Place 1 drop into both eyes daily.     rosuvastatin  (CRESTOR ) 20 MG tablet Take 1 tablet (20 mg total) by mouth every evening. 90 tablet 3   No current facility-administered medications for this visit.    REVIEW OF SYSTEMS:    10 Point review of Systems was done is negative except as noted above.  PHYSICAL EXAMINATION:  .BP 138/74   Pulse 74   Temp (!) 97 F (36.1 C)   Resp 20   Wt 160 lb 11.2 oz (72.9 kg)   SpO2 99%   BMI 28.47 kg/m  GENERAL:alert, in no acute distress and comfortable SKIN: no acute rashes, no significant lesions EYES: conjunctiva are pink and non-injected, sclera anicteric OROPHARYNX: MMM, no exudates, no oropharyngeal erythema or ulceration NECK: supple, no JVD LYMPH:  no palpable lymphadenopathy in the cervical, axillary or inguinal regions LUNGS: clear to auscultation b/l with normal respiratory effort HEART: regular rate & rhythm ABDOMEN:  normoactive bowel sounds , non tender, not distended. Extremity: no pedal edema PSYCH: alert & oriented x 3 with fluent speech NEURO: no focal motor/sensory deficits   LABORATORY DATA:  I have reviewed the data as listed  .    Latest Ref Rng & Units 05/16/2024   12:50 PM 05/14/2024   10:05 AM 04/04/2024   11:12 AM  CBC  WBC 4.0 - 10.5 K/uL 4.9  4.6    Hemoglobin 12.0 - 15.0 g/dL 89.0  89.2    Hematocrit 36.0 - 46.0 % 32.8  33.0  33.0   Platelets 150 - 400 K/uL 245  230      .    Latest Ref Rng & Units 05/16/2024   12:50 PM 03/21/2024    2:02 PM 11/22/2023    1:29 PM  CMP  Glucose 70 - 99 mg/dL 883  879  891  BUN 8 - 23 mg/dL 22  17  19     Creatinine 0.44 - 1.00 mg/dL 9.16  9.28  9.18   Sodium 135 - 145 mmol/L 137  138  139   Potassium 3.5 - 5.1 mmol/L 4.1  4.1  4.0   Chloride 98 - 111 mmol/L 103  104  104   CO2 22 - 32 mmol/L 28  30  29    Calcium  8.9 - 10.3 mg/dL 89.7  9.6  89.9   Total Protein 6.5 - 8.1 g/dL 8.4  8.0  7.8   Total Bilirubin 0.0 - 1.2 mg/dL 0.4  0.5  0.4   Alkaline Phos 38 - 126 U/L 43  41  41   AST 15 - 41 U/L 17  18  15    ALT 0 - 44 U/L 13  16  10     IFE/PE, serum 07/21/2023:   RADIOGRAPHIC STUDIES: I have personally reviewed the radiological images as listed and agreed with the findings in the report. CT BONE MARROW BIOPSY & ASPIRATION Result Date: 05/14/2024 INDICATION: MYELOMA EXAM: CT GUIDED RIGHT ILIAC BONE MARROW ASPIRATION AND CORE BIOPSY Date:  05/14/2024 05/14/2024 11:42 am Radiologist:  M. Frederic Specking, MD Guidance:  CT FLUOROSCOPY: Fluoroscopy Time: None. MEDICATIONS: 1% lidocaine  local ANESTHESIA/SEDATION: 2.0 mg IV Versed ; 100 mcg IV Fentanyl  Moderate Sedation Time:  10 minute The patient was continuously monitored during the procedure by the interventional radiology nurse under my direct supervision. CONTRAST:  None. COMPLICATIONS: None PROCEDURE: Informed consent was obtained from the patient following explanation of the procedure, risks, benefits and alternatives. The patient understands, agrees and consents for the procedure. All questions were addressed. A time out was performed. The patient was positioned prone and non-contrast localization CT was performed of the pelvis to demonstrate the iliac marrow spaces. Maximal barrier sterile technique utilized including caps, mask, sterile gowns, sterile gloves, large sterile drape, hand hygiene, and Betadine prep. Under sterile conditions and local anesthesia, an 11 gauge coaxial bone biopsy needle was advanced into the right iliac marrow space. Needle position was confirmed with CT imaging. Initially, bone marrow aspiration was performed. Next, the 11  gauge outer cannula was utilized to obtain a right iliac bone marrow core biopsy. Needle was removed. Hemostasis was obtained with compression. The patient tolerated the procedure well. Samples were prepared with the cytotechnologist. No immediate complications. IMPRESSION: CT guided right iliac bone marrow aspiration and core biopsy. Electronically Signed   By: CHRISTELLA.  Shick M.D.   On: 05/14/2024 12:09   NM PET Image Initial (PI) Skull Base To Thigh Result Date: 05/01/2024 CLINICAL DATA:  Initial treatment strategy for myeloma. EXAM: NUCLEAR MEDICINE PET SKULL BASE TO THIGH TECHNIQUE: 8.09 mCi F-18 FDG was injected intravenously. Full-ring PET imaging was performed from the skull base to thigh after the radiotracer. CT data was obtained and used for attenuation correction and anatomic localization. Fasting blood glucose: 116 mg/dl COMPARISON:  Lumbar spine CT 08/19/2023. Abdomen pelvis CT with contrast 09/12/2020. skeletal bone survey 08/15/2023. FINDINGS: Mediastinal blood pool activity: SUV max 2.7 Liver activity: SUV max 3.1 NECK: No specific abnormal uptake identified in the neck including along lymph node change of the submandibular, posterior triangle or internal jugular region. Near symmetric uptake of the visualized intracranial compartment. Incidental CT findings: The parotid glands, submandibular glands and thyroid  gland are unremarkable. Significant streak artifact related to the patient's dental hardware. Paranasal sinuses and mastoid air cells are clear. Right middle turbinate concha bullosa. CHEST: No specific abnormal uptake identified  above blood pool in the axillary regions, hilum or mediastinum. No abnormal lung parenchymal uptake. Incidental CT findings: Small hiatal hernia. Slightly patulous thoracic esophagus. No pericardial effusion. Scattered vascular calcifications are seen including along the coronary arteries. The thoracic aorta has a normal course and caliber. Scattered calcified plaque.  Breathing motion seen throughout the examination with some mild scattered ground-glass identified along the lower lung zones, nonspecific. No consolidation, pneumothorax or effusion. There is some middle lobe calcified nodules identified on image 72 of the CT scan, series 4. ABDOMEN/PELVIS: There is physiologic distribution radiotracer along the parenchymal organs, bowel and renal collecting systems. Specifically no abnormal uptake along the spleen. Splenic length approaches 10.4 cm. No abnormal uptake along lymph node chains in the abdomen and pelvis. Incidental CT findings: On this limited noncontrast examination, the liver, adrenal glands unremarkable. There is some fatty atrophy of the pancreas. Stone in the gallbladder. No abnormal calcifications seen within either kidney nor along the course of either ureter. Underdistended urinary bladder. Large bowel has a normal course and caliber. Left-sided colonic diverticula. Scattered colonic stool. Stomach and small bowel are nondilated. SKELETON: There is some mild uptake seen in the spine in the area of compression deformity non additions cement at T12 through L2. There is some minimal uptake along the proximal right humeral metaphyseal shaft with maximum SUV of 3.0. No erosive changes along the bone in this location. Nonspecific. No additional areas of abnormal uptake along the visualized osseous structures. Incidental CT findings: Diffuse degenerative changes identified. IMPRESSION: No specific abnormal radiotracer uptake identified. This includes along lymph node chains, spleen. No splenomegaly. Foley slight asymmetric uptake along the proximal right humeral shaft of maximum SUV of 3.0, nonspecific. This is in a different location than the lucent lesion seen on the bone survey October 2024. Augmentation cement seen at the spine at T12 through L2. Colonic diverticula. Gallstone. Hiatal hernia. Electronically Signed   By: Ranell Bring M.D.   On: 05/01/2024 16:57     ASSESSMENT & PLAN:   78 y.o. female with:  IgA kappa biclonal gammopathy  PLAN:  -Discussed lab results on 05/16/24  in detail with patient. CBC showed WBC of 4.9K, hemoglobin of 10.9, and platelets of 245K. -patient has mild anemia -05/14/2024 BMBx results pending - will follow up on findings -discussed that her 04/30/2024 PET scan is quite reassuring. There are no findings of specific abnormal uptake in any bones. Discussed finding of some mild uptake seen in the spine  in the area of compression deformity non additions cement at T12 through L2, which is is likely Osteoporotic. Discussed finding of non-specific uptake in the humerus is not related to indeterminate finding of lucent lesion found on bone survey October 2024 -there is no active myeloma in the bones or outside of the bones  -discussed that in rare cases, there can be extraosseous presentation of myeloma, which was not found in her case -patient has no enlarged lymph nodes or enlarged spleen -educated patient there are mixed types of plasma cells in the bone marrow. Educated patient that if levels range 5-9%, this would be consistent with MGUS, which is typically seen in 3-4% of the population above the age of 77 years old. Discussed that if there are 10% or greater plasma cells, all of one type, this would be consistent with at least smoldering myeloma.  -discussed that if smoldering myeloma is present with other features of active disease, this would be consistent with active myeloma.  -discussed that if there  is concern of MGUS, we would risk stratify based on the type and amount of abnormal protein -educated patient that if there is concern for high-risk smoldering myeloma, the risk of progression is 20-25%.  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  -discussed that if there is concern of  low-risk smoldering myeloma, there is a 5-15% risk of progression to active multiple myeloma.  -educated patient on the CRAB criteria for active myeloma, including criteria of elevated calcium , worsening kidney function, anemia, and bone tumors without any other explanation.  -discussed that in addition to the CRAB criteria, there are also a couple of other criterias for active myeloma.  -Educated patient that if the bone marrow has more than 60% plasma cells, even if the CRAB criteria is not met, it would meet criteria for active myeloma. I do not anticipate that this is a concern in patient's case.  -educated patient of the light chain criteria for active myeloma, where if kappa or lambda light chains are greater than 100 or K/L ratio is greater than 100, it would be considered active disease. This is not a concern in patient's case.  -discussed that genetic testing would determine the approach to treatment as it would try to predict the behavior of disease.  -discussed that if there is concern for smoldering myeloma, the risk of progression is based on genetics, the types of antibody that is made, and the extent of the amount of abnormal protein  -educated pt of Plasmacytomas, in which there can have low levels of protein with bone tumors.  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 If she has smoldering myeloma, we would monitor every 4 months with labs or sooner if needed.  -discussed that monoclonal protein in addition to other general factors would be an additional risk factor for bone loss.  -recommend aggressively treating her osteoporosis -she shall continue Prolia  -recommend vitamin D  supplementation -it would not be unreasonable for patient to take OTC vitamin B complex 3 days a week, especially with mild anemia -answered all of patient's and her husband's questions in  detail  FOLLOW-UP: Based on BM Bx  The total time spent in the appointment was 30 minutes* .  All of the patient's questions were answered with apparent satisfaction. The patient knows to call the clinic with any problems, questions or concerns.   Emaline Saran MD MS AAHIVMS [facility] Hematology/Oncology Physician [facility]  .*Total Encounter Time as defined by the Centers for Medicare and Medicaid Services includes, in addition to the face-to-face time of a patient visit (documented in the note above) non-face-to-face time: obtaining and reviewing outside history, ordering and reviewing medications, tests or procedures, care coordination (communications with other health care professionals or caregivers) and documentation in the medical record.    I,Mitra Faeizi,acting as a Neurosurgeon for Emaline Saran, MD.,have documented all relevant documentation on the behalf of Emaline Saran, MD,as directed by  Emaline Saran, MD while in the presence of Emaline Saran, MD.  .I have reviewed the above documentation for accuracy and completeness, and I agree with the above. .Danell Verno Kishore Trichelle Lehan MD

## 2024-05-17 LAB — KAPPA/LAMBDA LIGHT CHAINS
Kappa free light chain: 265.9 mg/L — ABNORMAL HIGH (ref 3.3–19.4)
Kappa, lambda light chain ratio: 106.36 — ABNORMAL HIGH (ref 0.26–1.65)
Lambda free light chains: 2.5 mg/L — ABNORMAL LOW (ref 5.7–26.3)

## 2024-05-18 ENCOUNTER — Encounter (HOSPITAL_COMMUNITY): Payer: Self-pay | Admitting: Hematology

## 2024-05-18 LAB — MULTIPLE MYELOMA PANEL, SERUM
Albumin SerPl Elph-Mcnc: 4.1 g/dL (ref 2.9–4.4)
Albumin/Glob SerPl: 1.1 (ref 0.7–1.7)
Alpha 1: 0.2 g/dL (ref 0.0–0.4)
Alpha2 Glob SerPl Elph-Mcnc: 0.8 g/dL (ref 0.4–1.0)
B-Globulin SerPl Elph-Mcnc: 1.2 g/dL (ref 0.7–1.3)
Gamma Glob SerPl Elph-Mcnc: 1.8 g/dL (ref 0.4–1.8)
Globulin, Total: 3.9 g/dL (ref 2.2–3.9)
IgA: 1886 mg/dL — ABNORMAL HIGH (ref 64–422)
IgG (Immunoglobin G), Serum: 568 mg/dL — ABNORMAL LOW (ref 586–1602)
IgM (Immunoglobulin M), Srm: 16 mg/dL — ABNORMAL LOW (ref 26–217)
M Protein SerPl Elph-Mcnc: 0.5 g/dL — ABNORMAL HIGH
Total Protein ELP: 8 g/dL (ref 6.0–8.5)

## 2024-05-24 ENCOUNTER — Encounter (HOSPITAL_COMMUNITY): Payer: Self-pay | Admitting: Hematology

## 2024-05-29 DIAGNOSIS — R7303 Prediabetes: Secondary | ICD-10-CM | POA: Diagnosis not present

## 2024-05-29 DIAGNOSIS — D472 Monoclonal gammopathy: Secondary | ICD-10-CM | POA: Diagnosis not present

## 2024-05-29 DIAGNOSIS — C541 Malignant neoplasm of endometrium: Secondary | ICD-10-CM | POA: Diagnosis not present

## 2024-05-29 DIAGNOSIS — M8000XA Age-related osteoporosis with current pathological fracture, unspecified site, initial encounter for fracture: Secondary | ICD-10-CM | POA: Diagnosis not present

## 2024-05-29 DIAGNOSIS — I480 Paroxysmal atrial fibrillation: Secondary | ICD-10-CM | POA: Diagnosis not present

## 2024-06-03 NOTE — Progress Notes (Signed)
 HEMATOLOGY/ONCOLOGY CLINIC VISIT NOTE  Date of Service: 06/04/2024  Patient Care Team: Larnell Hamilton, MD as PCP - General (Internal Medicine) Lavona Agent, MD as PCP - Cardiology (Cardiology) Cindie Ole DASEN, MD as PCP - Electrophysiology (Cardiology) Danielle Rom, MD as Consulting Physician (Obstetrics and Gynecology) Key, Hargis HERO, NP as Nurse Practitioner (Gynecology) Lavona Agent, MD as Consulting Physician (Cardiology) Onesimo Emaline Brink, MD as Consulting Physician (Hematology)  CHIEF COMPLAINTS/PURPOSE OF CONSULTATION:  Follow-up for newly diagnosed active multiple myeloma  HISTORY OF PRESENTING ILLNESS:   Mary Potter is a wonderful 78 y.o. 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  Today, she is accompanied by her daughter and son-in-law. Patient has had two vertebral compression fractures in her L-spine. She reports that her initial back fracture was from pulling too hard while adjusting her shoes, and the second occurred after pushing on a trash can lid.   Her back pain continues to be bothersome, though it has improved. She did previously have mild pain raidating to her left lower extremity, which has resolved. Back brace does improve back pain.  The first time she had a compression fracture, she was told that she was developing the beginnings of osteopenia. Patient reports that she did not tolerate Fosamax and she stopped taking it. She has not had a bone density study recently. Her last bone density study from 2018 showed osteopenia. Patient has never been on prolia  previously, though there are considerations to start it soon.   She reports having a muscle spasm on her left side, which is settling with muscle relaxants. She denies any leg swelling.   Patient reports a history of kidney stones, during which she had protein in her urine.  Patient has regularly been on  vitamin D . She has been off of calcium  for several years ago because of her fhx of heart disease.   INTERVAL HISTORY:  Mary Potter is a wonderful 78 y.o. 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   Patient was last seen by me on 05/16/2024 and reported constant heartburn managed with Nexium and also noted being on prolia  for osteoporosis.   Patient is here for follow-up of her bone marrow biopsy results. Labs done during her last visit show that her lambda light chain ratio is more than 100 with her involved Kappa light chain being >100.  Her bone marrow biopsy shows 70% kappa restricted plasma cells consistent with diagnosis of active multiple myeloma. Did not show any definitive bone lesions there is no there is no shift, For new diagnosis of active multiple myeloma, natural history of the disease, prognosis and treatment options were discussed in detail with the patient.  MEDICAL HISTORY:  Past Medical History:  Diagnosis Date   Atrial fibrillation (HCC)    Dysrhythmia    A-fib   Endometrial hyperplasia 01/03/2015   GERD (gastroesophageal reflux disease)    History of kidney stones    Hyperlipidemia    Hypertension    Pneumonia    PONV (postoperative nausea and vomiting)    slow to wake up   Thickened endometrium 01/03/2015   Vaginal delivery 1976, 1985    SURGICAL HISTORY: Past Surgical History:  Procedure Laterality Date   ABDOMINAL HYSTERECTOMY     ANKLE FRACTURE SURGERY     ATRIAL FIBRILLATION ABLATION N/A 02/08/2022   Procedure: ATRIAL FIBRILLATION ABLATION;  Surgeon: Cindie Ole DASEN, MD;  Location: [facility];  Service: Cardiovascular;  Laterality: N/A;   CARDIOVERSION N/A 02/17/2023   Procedure: CARDIOVERSION;  Surgeon: Sheena Pugh, DO;  Location: [facility];  Service: Cardiovascular;  Laterality: N/A;   CATARACT EXTRACTION Right 2020   DILATION AND CURETTAGE OF UTERUS  2012   ablation    EYE SURGERY      Corneal growth removal on right eye   HYSTEROSCOPY WITH D & C N/A 01/03/2015   Procedure: DILATATION AND CURETTAGE /HYSTEROSCOPY;  Surgeon: Robbi Render, MD;  Location: [facility];  Service: Gynecology;  Laterality: N/A;   LAPAROSCOPY     for endometriosis   ROBOTIC ASSISTED TOTAL HYSTERECTOMY WITH BILATERAL SALPINGO OOPHERECTOMY N/A 03/17/2021   Procedure: XI ROBOTIC ASSISTED TOTAL HYSTERECTOMY WITH BILATERAL SALPINGO OOPHORECTOMY;  Surgeon: Viktoria Comer SAUNDERS, MD;  Location: [facility];  Service: Gynecology;  Laterality: N/A;   SENTINEL NODE BIOPSY N/A 03/17/2021   Procedure: SENTINEL NODE BIOPSY;  Surgeon: Viktoria Comer SAUNDERS, MD;  Location: [facility];  Service: Gynecology;  Laterality: N/A;   TONSILLECTOMY AND ADENOIDECTOMY      SOCIAL HISTORY: Social History   Socioeconomic History   Marital status: Widowed    Spouse name: Sharolyn   Number of children: 2   Years of education: 14   Highest education level: Not on file  Occupational History   Occupation: retired    Comment: [employer]  Tobacco Use   Smoking status: Never    Passive exposure: Past   Smokeless tobacco: Never   Tobacco comments:    Never smoke 03/02/23  Vaping Use   Vaping status: Never Used  Substance and Sexual Activity   Alcohol  use: No    Alcohol /week: 0.0 standard drinks of alcohol    Drug use: No   Sexual activity: Yes    Birth control/protection: Other-see comments    Comment: Hysterectomy  Other Topics Concern   Not on file  Social History Narrative   Lives with her younger daughter, [name]. Her older daughter lives in [place] with her family.   Social Drivers of Corporate investment banker Strain: Not on file  Food Insecurity: No Food Insecurity (04/10/2024)   Hunger Vital Sign    Worried About Running Out of Food in the Last Year: Never true    Ran Out of Food in the Last Year: Never true  Transportation Needs: No Transportation Needs (04/10/2024)   PRAPARE - Scientist, research (physical sciences) (Medical): No    Lack of Transportation (Non-Medical): No  Physical Activity: Not on file  Stress: Not on file  Social Connections: Not on file  Intimate Partner Violence: Not At Risk (04/10/2024)   Humiliation, Afraid, Rape, and Kick questionnaire    Fear of Current or Ex-Partner: No    Emotionally Abused: No    Physically Abused: No    Sexually Abused: No    FAMILY HISTORY: Family History  Problem Relation Age of Onset   Heart disease Mother 21       CABG   Stroke Father    Prostate cancer Father    Heart disease Brother 100       Transplant, died 10 years   Heart disease Maternal Grandmother    Heart disease Paternal Grandmother    Cancer Paternal Grandfather    Heart disease Brother 80       RF   Leukemia Brother    Leukemia Brother    Hypertension Sister    Cervical cancer Sister  Colon cancer Neg Hx    Breast cancer Neg Hx    Ovarian cancer Neg Hx    Endometrial cancer Neg Hx    Pancreatic cancer Neg Hx     ALLERGIES:  is allergic to talwin [pentazocine], hydrocodone, and prednisone .  MEDICATIONS:  Current Outpatient Medications  Medication Sig Dispense Refill   acetaminophen  (TYLENOL ) 500 MG tablet Take 1,000 mg by mouth every 6 (six) hours as needed (Arthritis Pain).     ALPRAZolam (XANAX) 0.5 MG tablet Take 0.25 mg by mouth daily as needed (anxiousness).     apixaban  (ELIQUIS ) 5 MG TABS tablet Take 1 tablet (5 mg total) by mouth 2 (two) times daily. 180 tablet 3   Calcium  Carb-Cholecalciferol 600-3.125 MG-MCG TABS      Cholecalciferol (VITAMIN D ) 50 MCG (2000 UT) CAPS Take 2,000 Units by mouth at bedtime.     estradiol  (VIVELLE -DOT) 0.025 MG/24HR PLACE 1 PATCH ONTO THE SKIN 2 (TWO) TIMES A WEEK. SUNDAYS & WEDNESDAYS 8 patch 0   metoprolol  tartrate (LOPRESSOR ) 50 MG tablet TAKE ONE TABLET BY MOUTH TWICE A DAY 180 tablet 3   olmesartan (BENICAR) 40 MG tablet Take 40 mg by mouth daily.     Polyethyl Glycol-Propyl Glycol (SYSTANE OP) Place 1 drop  into both eyes daily.     rosuvastatin  (CRESTOR ) 20 MG tablet Take 1 tablet (20 mg total) by mouth every evening. 90 tablet 3   No current facility-administered medications for this visit.    REVIEW OF SYSTEMS:    .10 Point review of Systems was done is negative except as noted above.  PHYSICAL EXAMINATION:  .BP (!) 153/72 Comment: re- check/pt nervous  Pulse 70   Temp (!) 97.3 F (36.3 C)   Resp 20   Wt 164 lb (74.4 kg)   SpO2 98%   BMI 29.05 kg/m   GENERAL:alert, in no acute distress and comfortable SKIN: no acute rashes, no significant lesions EYES: conjunctiva are pink and non-injected, sclera anicteric OROPHARYNX: MMM, no exudates, no oropharyngeal erythema or ulceration NECK: supple, no JVD LYMPH:  no palpable lymphadenopathy in the cervical, axillary or inguinal regions LUNGS: clear to auscultation b/l with normal respiratory effort HEART: regular rate & rhythm ABDOMEN:  normoactive bowel sounds , non tender, not distended. Extremity: no pedal edema PSYCH: alert & oriented x 3 with fluent speech NEURO: no focal motor/sensory deficits   LABORATORY DATA:  I have reviewed the data as listed  .    Latest Ref Rng & Units 05/16/2024   12:50 PM 05/14/2024   10:05 AM 04/04/2024   11:12 AM  CBC  WBC 4.0 - 10.5 K/uL 4.9  4.6    Hemoglobin 12.0 - 15.0 g/dL 89.0  89.2    Hematocrit 36.0 - 46.0 % 32.8  33.0  33.0   Platelets 150 - 400 K/uL 245  230       .    Latest Ref Rng & Units 05/16/2024   12:50 PM 03/21/2024    2:02 PM 11/22/2023    1:29 PM  CMP  Glucose 70 - 99 mg/dL 883  879  891   BUN 8 - 23 mg/dL 22  17  19    Creatinine 0.44 - 1.00 mg/dL 9.16  9.28  9.18   Sodium 135 - 145 mmol/L 137  138  139   Potassium 3.5 - 5.1 mmol/L 4.1  4.1  4.0   Chloride 98 - 111 mmol/L 103  104  104   CO2 22 - 32  mmol/L 28  30  29    Calcium  8.9 - 10.3 mg/dL 89.7  9.6  89.9   Total Protein 6.5 - 8.1 g/dL 8.4  8.0  7.8   Total Bilirubin 0.0 - 1.2 mg/dL 0.4  0.5  0.4   Alkaline  Phos 38 - 126 U/L 43  41  41   AST 15 - 41 U/L 17  18  15    ALT 0 - 44 U/L 13  16  10     IFE/PE, serum 07/21/2023:  Surgical Pathology  CASE: [id number]  PATIENT: Mary Potter  Bone Marrow Report      Clinical History: MYELOMA      DIAGNOSIS:   BONE MARROW, ASPIRATE, CLOT, CORE:  - Hypercellular bone marrow (60%) involved by plasma cell neoplasm (63%  plasma cells by manual aspirate differential, approximately 70% by CD138  immunohistochemical analysis, and kappa monotypic by kappa/lambda in  situ hybridization).  See comment.   PERIPHERAL BLOOD:  - Normocytic normochromic anemia   COMMENT:   Correlation with clinical findings, radiographic skeletal survey,  myeloma panel and other laboratory tests (SPEP, UPEP, beta  microglobulin, etc.) is recommended for a complete assessment and  classification of this case.     RADIOGRAPHIC STUDIES: I have personally reviewed the radiological images as listed and agreed with the findings in the report. CT BONE MARROW BIOPSY & ASPIRATION Result Date: 05/14/2024 INDICATION: MYELOMA EXAM: CT GUIDED RIGHT ILIAC BONE MARROW ASPIRATION AND CORE BIOPSY Date:  05/14/2024 05/14/2024 11:42 am Radiologist:  M. Frederic Specking, MD Guidance:  CT FLUOROSCOPY: Fluoroscopy Time: None. MEDICATIONS: 1% lidocaine  local ANESTHESIA/SEDATION: 2.0 mg IV Versed ; 100 mcg IV Fentanyl  Moderate Sedation Time:  10 minute The patient was continuously monitored during the procedure by the interventional radiology nurse under my direct supervision. CONTRAST:  None. COMPLICATIONS: None PROCEDURE: Informed consent was obtained from the patient following explanation of the procedure, risks, benefits and alternatives. The patient understands, agrees and consents for the procedure. All questions were addressed. A time out was performed. The patient was positioned prone and non-contrast localization CT was performed of the pelvis to demonstrate the iliac marrow spaces.  Maximal barrier sterile technique utilized including caps, mask, sterile gowns, sterile gloves, large sterile drape, hand hygiene, and Betadine prep. Under sterile conditions and local anesthesia, an 11 gauge coaxial bone biopsy needle was advanced into the right iliac marrow space. Needle position was confirmed with CT imaging. Initially, bone marrow aspiration was performed. Next, the 11 gauge outer cannula was utilized to obtain a right iliac bone marrow core biopsy. Needle was removed. Hemostasis was obtained with compression. The patient tolerated the procedure well. Samples were prepared with the cytotechnologist. No immediate complications. IMPRESSION: CT guided right iliac bone marrow aspiration and core biopsy. Electronically Signed   By: CHRISTELLA.  Shick M.D.   On: 05/14/2024 12:09    ASSESSMENT & PLAN:   77 y.o. female with:  Newly diagnosed IgA kappa active multiple myeloma.  PLAN:  -Discussed lab results from today, 06/04/2024, in detail with patient  - Discussed labs from 05/16/2024 serum kappa lambda ratio more than 100 - Bone marrow biopsy shows 70% kappa restricted plasma cells consistent with active myeloma based on the presence of more than 60% involvement with plasma cell neoplasm and also based on light chain criteria. - Molecular cytogenetics show 13 q. deletion suggesting standard risk myeloma. - Discussed the diagnosis, natural history, prognosis, staging, treatment options in detail with the patient. - Will start the patient on DVd  treatment and add Revlimid later based on reponse. - Patient notes no history of neuropathy. - Does have a history of A-fib and is on anticoagulation.  No previous history of MI or CVA or VTE. - Chemo counseling for treatment - Acyclovir  for shingles prophylaxis - Other supportive medications ordered  FOLLOW-UP: Chemo counseling for Dara faspro/Velcade/dexamethasone  in 1 week - plan to star tDVd treatment in about 2 weeks MD visit with C1D8 of  treatment for toxicity check  The total time spent in the appointment was 40 minutes* .  All of the patient's questions were answered with apparent satisfaction. The patient knows to call the clinic with any problems, questions or concerns.   Emaline Saran MD MS AAHIVMS [facility] Hematology/Oncology Physician [facility]  .*Total Encounter Time as defined by the Centers for Medicare and Medicaid Services includes, in addition to the face-to-face time of a patient visit (documented in the note above) non-face-to-face time: obtaining and reviewing outside history, ordering and reviewing medications, tests or procedures, care coordination (communications with other health care professionals or caregivers) and documentation in the medical record.    I,Mitra Faeizi,acting as a Neurosurgeon for Emaline Saran, MD.,have documented all relevant documentation on the behalf of Emaline Saran, MD,as directed by  Emaline Saran, MD while in the presence of Emaline Saran, MD.  .I have reviewed the above documentation for accuracy and completeness, and I agree with the above. .Miriah Maruyama Kishore Shahida Schnackenberg MD

## 2024-06-04 ENCOUNTER — Inpatient Hospital Stay: Attending: Hematology | Admitting: Hematology

## 2024-06-04 VITALS — BP 153/72 | HR 70 | Temp 97.3°F | Resp 20 | Wt 164.0 lb

## 2024-06-04 DIAGNOSIS — Z79899 Other long term (current) drug therapy: Secondary | ICD-10-CM | POA: Diagnosis not present

## 2024-06-04 DIAGNOSIS — M549 Dorsalgia, unspecified: Secondary | ICD-10-CM | POA: Diagnosis not present

## 2024-06-04 DIAGNOSIS — C9000 Multiple myeloma not having achieved remission: Secondary | ICD-10-CM | POA: Insufficient documentation

## 2024-06-04 DIAGNOSIS — D649 Anemia, unspecified: Secondary | ICD-10-CM | POA: Insufficient documentation

## 2024-06-04 DIAGNOSIS — Z7901 Long term (current) use of anticoagulants: Secondary | ICD-10-CM | POA: Insufficient documentation

## 2024-06-04 DIAGNOSIS — I4891 Unspecified atrial fibrillation: Secondary | ICD-10-CM | POA: Insufficient documentation

## 2024-06-04 DIAGNOSIS — Z7189 Other specified counseling: Secondary | ICD-10-CM

## 2024-06-10 ENCOUNTER — Encounter: Payer: Self-pay | Admitting: Hematology

## 2024-06-10 DIAGNOSIS — Z7189 Other specified counseling: Secondary | ICD-10-CM | POA: Insufficient documentation

## 2024-06-10 DIAGNOSIS — C9000 Multiple myeloma not having achieved remission: Secondary | ICD-10-CM | POA: Insufficient documentation

## 2024-06-10 MED ORDER — ACYCLOVIR 400 MG PO TABS: 400.0000 mg | ORAL_TABLET | Freq: Two times a day (BID) | ORAL | 5 refills | Status: DC

## 2024-06-10 MED ORDER — ONDANSETRON HCL 8 MG PO TABS: 8.0000 mg | ORAL_TABLET | Freq: Three times a day (TID) | ORAL | 1 refills | Status: AC | PRN

## 2024-06-10 MED ORDER — PROCHLORPERAZINE MALEATE 10 MG PO TABS
10.0000 mg | ORAL_TABLET | Freq: Four times a day (QID) | ORAL | 1 refills | Status: AC | PRN
Start: 2024-06-10 — End: ?

## 2024-06-10 MED ORDER — DEXAMETHASONE 4 MG PO TABS
ORAL_TABLET | ORAL | 5 refills | Status: DC
Start: 2024-06-10 — End: 2024-07-27

## 2024-06-10 NOTE — Progress Notes (Signed)
 START ON PATHWAY REGIMEN - Multiple Myeloma and Other Plasma Cell Dyscrasias   DaraVRd (Daratumumab SUBQ + Bortezomib SUBQ D1,4,8,11 + Lenalidomide PO D1-14 + Dexamethasone 20 mg IV/PO D1,2,8,9,15,16) q21 Days (Induction Schema):   A cycle is every 21 days:     Lenalidomide      Dexamethasone      Bortezomib      Daratumumab and hyaluronidase-fihj    DaraVRd (Daratumumab SUBQ + Bortezomib SUBQ D1,4,8,11 + Lenalidomide PO D1-14 + Dexamethasone 20 mg IV/PO D1,2,8,9,15,16) q21 Days (Consolidation Schema):   A cycle is every 21 days:     Lenalidomide      Dexamethasone      Bortezomib      Daratumumab and hyaluronidase-fihj   **Always confirm dose/schedule in your pharmacy ordering system**  Patient Characteristics: Multiple Myeloma, Newly Diagnosed, Transplant Eligible, Standard Risk Disease Classification: Multiple Myeloma Therapeutic Status: Newly Diagnosed R2-ISS Staging: Awaiting Test Results Is Patient Eligible for Transplant<= Transplant Eligible Risk Status: Standard Risk Intent of Therapy: Non-Curative / Palliative Intent, Discussed with Patient

## 2024-06-11 ENCOUNTER — Other Ambulatory Visit: Payer: Self-pay

## 2024-06-13 ENCOUNTER — Telehealth: Payer: Self-pay | Admitting: Hematology

## 2024-06-18 ENCOUNTER — Inpatient Hospital Stay

## 2024-06-18 DIAGNOSIS — I4891 Unspecified atrial fibrillation: Secondary | ICD-10-CM | POA: Diagnosis not present

## 2024-06-18 DIAGNOSIS — Z79899 Other long term (current) drug therapy: Secondary | ICD-10-CM | POA: Diagnosis not present

## 2024-06-18 DIAGNOSIS — M549 Dorsalgia, unspecified: Secondary | ICD-10-CM | POA: Diagnosis not present

## 2024-06-18 DIAGNOSIS — C9000 Multiple myeloma not having achieved remission: Secondary | ICD-10-CM | POA: Diagnosis not present

## 2024-06-18 DIAGNOSIS — Z7189 Other specified counseling: Secondary | ICD-10-CM

## 2024-06-18 DIAGNOSIS — D649 Anemia, unspecified: Secondary | ICD-10-CM | POA: Diagnosis not present

## 2024-06-18 DIAGNOSIS — Z7901 Long term (current) use of anticoagulants: Secondary | ICD-10-CM | POA: Diagnosis not present

## 2024-06-18 LAB — CBC WITH DIFFERENTIAL (CANCER CENTER ONLY)
Abs Immature Granulocytes: 0.02 K/uL (ref 0.00–0.07)
Basophils Absolute: 0 K/uL (ref 0.0–0.1)
Basophils Relative: 0 %
Eosinophils Absolute: 0.1 K/uL (ref 0.0–0.5)
Eosinophils Relative: 3 %
HCT: 31.4 % — ABNORMAL LOW (ref 36.0–46.0)
Hemoglobin: 10.3 g/dL — ABNORMAL LOW (ref 12.0–15.0)
Immature Granulocytes: 0 %
Lymphocytes Relative: 24 %
Lymphs Abs: 1.2 K/uL (ref 0.7–4.0)
MCH: 31.1 pg (ref 26.0–34.0)
MCHC: 32.8 g/dL (ref 30.0–36.0)
MCV: 94.9 fL (ref 80.0–100.0)
Monocytes Absolute: 0.2 K/uL (ref 0.1–1.0)
Monocytes Relative: 4 %
Neutro Abs: 3.4 K/uL (ref 1.7–7.7)
Neutrophils Relative %: 69 %
Platelet Count: 264 K/uL (ref 150–400)
RBC: 3.31 MIL/uL — ABNORMAL LOW (ref 3.87–5.11)
RDW: 15.2 % (ref 11.5–15.5)
WBC Count: 5 K/uL (ref 4.0–10.5)
nRBC: 0 % (ref 0.0–0.2)

## 2024-06-18 LAB — CMP (CANCER CENTER ONLY)
ALT: 15 U/L (ref 0–44)
AST: 19 U/L (ref 15–41)
Albumin: 4.4 g/dL (ref 3.5–5.0)
Alkaline Phosphatase: 40 U/L (ref 38–126)
Anion gap: 6 (ref 5–15)
BUN: 18 mg/dL (ref 8–23)
CO2: 27 mmol/L (ref 22–32)
Calcium: 9.9 mg/dL (ref 8.9–10.3)
Chloride: 105 mmol/L (ref 98–111)
Creatinine: 0.71 mg/dL (ref 0.44–1.00)
GFR, Estimated: >60 mL/min (ref >60)
Glucose, Bld: 112 mg/dL — ABNORMAL HIGH (ref 70–99)
Potassium: 4.3 mmol/L (ref 3.5–5.1)
Sodium: 138 mmol/L (ref 135–145)
Total Bilirubin: 0.4 mg/dL (ref 0.0–1.2)
Total Protein: 8.3 g/dL — ABNORMAL HIGH (ref 6.5–8.1)

## 2024-06-18 LAB — TYPE AND SCREEN
ABO/RH(D): A POS
Antibody Screen: NEGATIVE

## 2024-06-18 LAB — PRETREATMENT RBC PHENOTYPE

## 2024-06-19 ENCOUNTER — Inpatient Hospital Stay

## 2024-06-19 NOTE — Progress Notes (Signed)
 [facility] Psychosocial Distress Screening Clinical Social Work  Mary Potter is a 78 y.o. year old female. Clinical Social Work was referred by distress screen protocol for positive distress screening. The patient scored a 8 on the Psychosocial Distress Thermometer which indicates moderate distress. Clinical Social Worker contacted patient by phone to assess for distress and other psychosocial needs.     Distress Screen:    06/18/2024    3:42 PM  ONCBCN DISTRESS SCREENING  Screening Type Initial Screening  How much distress have you been experiencing in the past week? (0-10) 8  Practical concerns type Taking care of myself  Emotional concerns type Fear;Worry or anxiety  Physical Concerns Type  Sleep;Fatigue     Interventions:  CSW introduced herself as member of her support team and explained reason for referral.  Patient discussed score and provided CSW with context around the chemo-education appointment, attributing her score to sense of overwhelm, inability for time to process diagnosis, and past familial experiences with the cancer center.  CSW discussed common feelings and emotions associated with the cancer diagnosis, especially those with familial history of cancer.   CSW and patient discussed desire to continue normalcy of lifestyle activities, including manicures and hair appointments.CSW and patient discussed common desire of patient's to feel like themselves and have pockets of joy. Encouraged patient to discuss further with nurse / provider.    CSW informed patient of the support team and support services at [facility].    CSW provided contact information and encouraged patient to call with any questions or concerns.   Follow Up Plan: No follow up scheduled. No SDOH barriers identified. CSW will continue to be available as patient continues through treatment. Patient verbalizes understanding of plan: Yes    Lizbeth Sprague, LCSW  Clinical Social Worker [facility]

## 2024-06-21 ENCOUNTER — Encounter: Payer: Self-pay | Admitting: Hematology

## 2024-06-21 ENCOUNTER — Other Ambulatory Visit: Payer: Self-pay | Admitting: Hematology

## 2024-06-22 ENCOUNTER — Ambulatory Visit

## 2024-06-22 VITALS — BP 134/74 | HR 72 | Temp 97.6°F | Resp 18 | Wt 160.8 lb

## 2024-06-22 DIAGNOSIS — C9000 Multiple myeloma not having achieved remission: Secondary | ICD-10-CM

## 2024-06-22 DIAGNOSIS — Z7189 Other specified counseling: Secondary | ICD-10-CM

## 2024-06-22 DIAGNOSIS — Z79899 Other long term (current) drug therapy: Secondary | ICD-10-CM | POA: Diagnosis not present

## 2024-06-22 DIAGNOSIS — Z7901 Long term (current) use of anticoagulants: Secondary | ICD-10-CM | POA: Diagnosis not present

## 2024-06-22 DIAGNOSIS — M549 Dorsalgia, unspecified: Secondary | ICD-10-CM | POA: Diagnosis not present

## 2024-06-22 DIAGNOSIS — I4891 Unspecified atrial fibrillation: Secondary | ICD-10-CM | POA: Diagnosis not present

## 2024-06-22 DIAGNOSIS — D649 Anemia, unspecified: Secondary | ICD-10-CM | POA: Diagnosis not present

## 2024-06-22 MED ORDER — FAMOTIDINE 20 MG PO TABS
20.0000 mg | ORAL_TABLET | Freq: Once | ORAL | Status: AC
  Administered 2024-06-22: 20 mg via ORAL
  Filled 2024-06-22: qty 1

## 2024-06-22 MED ORDER — DEXAMETHASONE 4 MG PO TABS
20.0000 mg | ORAL_TABLET | Freq: Once | ORAL | Status: AC
  Administered 2024-06-22: 20 mg via ORAL
  Filled 2024-06-22: qty 5

## 2024-06-22 MED ORDER — ACETAMINOPHEN 325 MG PO TABS
650.0000 mg | ORAL_TABLET | Freq: Once | ORAL | Status: AC
  Administered 2024-06-22: 650 mg via ORAL
  Filled 2024-06-22: qty 2

## 2024-06-22 MED ORDER — DARATUMUMAB-HYALURONIDASE-FIHJ 1800-30000 MG-UT/15ML ~~LOC~~ SOLN
1800.0000 mg | Freq: Once | SUBCUTANEOUS | Status: AC
  Administered 2024-06-22: 1800 mg via SUBCUTANEOUS
  Filled 2024-06-22: qty 15

## 2024-06-22 MED ORDER — BORTEZOMIB CHEMO SQ INJECTION 3.5 MG (2.5MG/ML)
1.3000 mg/m2 | Freq: Once | INTRAMUSCULAR | Status: AC
  Administered 2024-06-22: 2.25 mg via SUBCUTANEOUS
  Filled 2024-06-22: qty 0.9

## 2024-06-22 MED ORDER — DIPHENHYDRAMINE HCL 25 MG PO CAPS
50.0000 mg | ORAL_CAPSULE | Freq: Once | ORAL | Status: AC
  Administered 2024-06-22: 50 mg via ORAL
  Filled 2024-06-22: qty 2

## 2024-06-22 MED ORDER — MONTELUKAST SODIUM 10 MG PO TABS
10.0000 mg | ORAL_TABLET | Freq: Once | ORAL | Status: AC
  Administered 2024-06-22: 10 mg via ORAL
  Filled 2024-06-22: qty 1

## 2024-06-22 NOTE — Patient Instructions (Signed)
 [facility] - A DEPT OF [facility]  Discharge Instructions: Thank you for choosing [facility] to provide your oncology and hematology care.   If you have a lab appointment with the Cancer Center, please go directly to the Cancer Center and check in at the registration area.   Wear comfortable clothing and clothing appropriate for easy access to any Portacath or PICC line.   We strive to give you quality time with your provider. You may need to reschedule your appointment if you arrive late (15 or more minutes).  Arriving late affects you and other patients whose appointments are after yours.  Also, if you miss three or more appointments without notifying the office, you may be dismissed from the clinic at the provider's discretion.      For prescription refill requests, have your pharmacy contact our office and allow 72 hours for refills to be completed.    Today you received the following chemotherapy and/or immunotherapy agents: Velcade , Daratumumab  and Hyaluronidase .       To help prevent nausea and vomiting after your treatment, we encourage you to take your nausea medication as directed.  BELOW ARE SYMPTOMS THAT SHOULD BE REPORTED IMMEDIATELY: *FEVER GREATER THAN 100.4 F (38 C) OR HIGHER *CHILLS OR SWEATING *NAUSEA AND VOMITING THAT IS NOT CONTROLLED WITH YOUR NAUSEA MEDICATION *UNUSUAL SHORTNESS OF BREATH *UNUSUAL BRUISING OR BLEEDING *URINARY PROBLEMS (pain or burning when urinating, or frequent urination) *BOWEL PROBLEMS (unusual diarrhea, constipation, pain near the anus) TENDERNESS IN MOUTH AND THROAT WITH OR WITHOUT PRESENCE OF ULCERS (sore throat, sores in mouth, or a toothache) UNUSUAL RASH, SWELLING OR PAIN  UNUSUAL VAGINAL DISCHARGE OR ITCHING   Items with * indicate a potential emergency and should be followed up as soon as possible or go to the Emergency Department if any problems should occur.  Please show the  CHEMOTHERAPY ALERT CARD or IMMUNOTHERAPY ALERT CARD at check-in to the Emergency Department and triage nurse.  Should you have questions after your visit or need to cancel or reschedule your appointment, please contact [facility] - A DEPT OF [facility]  Dept: [phone]  and follow the prompts.  Office hours are 8:00 a.m. to 4:30 p.m. Monday - Friday. Please note that voicemails left after 4:00 p.m. may not be returned until the following business day.  We are closed weekends and major holidays. You have access to a nurse at all times for urgent questions. Please call the main number to the clinic Dept: [phone] and follow the prompts.   For any non-urgent questions, you may also contact your provider using MyChart. We now offer e-Visits for anyone 19 and older to request care online for non-urgent symptoms. For details visit [email or web address].   Also download the MyChart app! Go to the app store, search MyChart, open the app, select [facility], and log in with your MyChart username and password.  Bortezomib  Injection What is this medication? BORTEZOMIB  (bor TEZ oh mib) treats lymphoma. It may also be used to treat multiple myeloma, a type of bone marrow cancer. It works by blocking a protein that causes cancer cells to grow and multiply. This helps to slow or stop the spread of cancer cells. This medicine may be used for other purposes; ask your health care provider or pharmacist if you have questions. COMMON BRAND NAME(S): BORUZU , Velcade  What should I tell my care team before I take this  medication? They need to know if you have any of these conditions: Dehydration Diabetes Heart disease Liver disease Tingling of the fingers or toes or other nerve disorder An unusual or allergic reaction to bortezomib , other medications, foods, dyes, or preservatives If you or your partner are pregnant or trying to get pregnant Breastfeeding How should I use  this medication? This medication is injected into a vein or under the skin. It is given by your care team in a hospital or clinic setting. Talk to your care team about the use of this medication in children. Special care may be needed. Overdosage: If you think you have taken too much of this medicine contact a poison control center or emergency room at once. NOTE: This medicine is only for you. Do not share this medicine with others. What if I miss a dose? Keep appointments for follow-up doses. It is important not to miss your dose. Call your care team if you are unable to keep an appointment. What may interact with this medication? Ketoconazole Rifampin This list may not describe all possible interactions. Give your health care provider a list of all the medicines, herbs, non-prescription drugs, or dietary supplements you use. Also tell them if you smoke, drink alcohol , or use illegal drugs. Some items may interact with your medicine. What should I watch for while using this medication? Your condition will be monitored carefully while you are receiving this medication. You may need blood work while taking this medication. This medication may affect your coordination, reaction time, or judgment. Do not drive or operate machinery until you know how this medication affects you. Sit up or stand slowly to reduce the risk of dizzy or fainting spells. Drinking alcohol  with this medication can increase the risk of these side effects. This medication may increase your risk of getting an infection. Call your care team for advice if you get a fever, chills, sore throat, or other symptoms of a cold or flu. Do not treat yourself. Try to avoid being around people who are sick. Check with your care team if you have severe diarrhea, nausea, and vomiting, or if you sweat a lot. The loss of too much body fluid may make it dangerous for you to take this medication. Talk to your care team if you may be pregnant.  Serious birth defects can occur if you take this medication during pregnancy and for 7 months after the last dose. You will need a negative pregnancy test before starting this medication. Contraception is recommended while taking this medication and for 7 months after the last dose. Your care team can help you find the option that works for you. If your partner can get pregnant, use a condom during sex while taking this medication and for 4 months after the last dose. Do not breastfeed while taking this medication and for 2 months after the last dose. This medication may cause infertility. Talk to your care team if you are concerned about your fertility. What side effects may I notice from receiving this medication? Side effects that you should report to your care team as soon as possible: Allergic reactions--skin rash, itching, hives, swelling of the face, lips, tongue, or throat Bleeding--bloody or black, tar-like stools, vomiting blood or brown material that looks like coffee grounds, red or dark brown urine, small red or purple spots on skin, unusual bruising or bleeding Bleeding in the brain--severe headache, stiff neck, confusion, dizziness, change in vision, numbness or weakness of the face, arm, or leg,  trouble speaking, trouble walking, vomiting Bowel blockage--stomach cramping, unable to have a bowel movement or pass gas, loss of appetite, vomiting Heart failure--shortness of breath, swelling of the ankles, feet, or hands, sudden weight gain, unusual weakness or fatigue Infection--fever, chills, cough, sore throat, wounds that don't heal, pain or trouble when passing urine, general feeling of discomfort or being unwell Liver injury--right upper belly pain, loss of appetite, nausea, light-colored stool, dark yellow or brown urine, yellowing skin or eyes, unusual weakness or fatigue Low blood pressure--dizziness, feeling faint or lightheaded, blurry vision Lung injury--shortness of breath or  trouble breathing, cough, spitting up blood, chest pain, fever Pain, tingling, or numbness in the hands or feet Severe or prolonged diarrhea Stomach pain, bloody diarrhea, pale skin, unusual weakness or fatigue, decrease in the amount of urine, which may be signs of hemolytic uremic syndrome Sudden and severe headache, confusion, change in vision, seizures, which may be signs of posterior reversible encephalopathy syndrome (PRES) TTP--purple spots on the skin or inside the mouth, pale skin, yellowing skin or eyes, unusual weakness or fatigue, fever, fast or irregular heartbeat, confusion, change in vision, trouble speaking, trouble walking Tumor lysis syndrome (TLS)--nausea, vomiting, diarrhea, decrease in the amount of urine, dark urine, unusual weakness or fatigue, confusion, muscle pain or cramps, fast or irregular heartbeat, joint pain Side effects that usually do not require medical attention (report to your care team if they continue or are bothersome): Constipation Diarrhea Fatigue Loss of appetite Nausea This list may not describe all possible side effects. Call your doctor for medical advice about side effects. You may report side effects to FDA at 1-800-FDA-1088. Where should I keep my medication? This medication is given in a hospital or clinic. It will not be stored at home. NOTE: This sheet is a summary. It may not cover all possible information. If you have questions about this medicine, talk to your doctor, pharmacist, or health care provider.  2024 Elsevier/Gold Standard (2022-03-23 00:00:00) Daratumumab ; Hyaluronidase  Injection What is this medication? DARATUMUMAB ; HYALURONIDASE  (dar a toom ue mab; hye al ur ON i dase) treats multiple myeloma, a type of bone marrow cancer. Daratumumab  works by blocking a protein that causes cancer cells to grow and multiply. This helps to slow or stop the spread of cancer cells. Hyaluronidase  works by increasing the absorption of other  medications in the body to help them work better. This medication may also be used treat amyloidosis, a condition that causes the buildup of a protein (amyloid) in your body. It works by reducing the buildup of this protein, which decreases symptoms. It is a combination medication that contains a monoclonal antibody. This medicine may be used for other purposes; ask your health care provider or pharmacist if you have questions. COMMON BRAND NAME(S): DARZALEX  FASPRO What should I tell my care team before I take this medication? They need to know if you have any of these conditions: Heart disease Infection, such as chickenpox, cold sores, herpes, hepatitis B Lung or breathing disease An unusual or allergic reaction to daratumumab , hyaluronidase , other medications, foods, dyes, or preservatives Pregnant or trying to get pregnant Breast-feeding How should I use this medication? This medication is injected under the skin. It is given by your care team in a hospital or clinic setting. Talk to your care team about the use of this medication in children. Special care may be needed. Overdosage: If you think you have taken too much of this medicine contact a poison control center or emergency room at  once. NOTE: This medicine is only for you. Do not share this medicine with others. What if I miss a dose? Keep appointments for follow-up doses. It is important not to miss your dose. Call your care team if you are unable to keep an appointment. What may interact with this medication? Interactions have not been studied. This list may not describe all possible interactions. Give your health care provider a list of all the medicines, herbs, non-prescription drugs, or dietary supplements you use. Also tell them if you smoke, drink alcohol , or use illegal drugs. Some items may interact with your medicine. What should I watch for while using this medication? Your condition will be monitored carefully while you  are receiving this medication. This medication can cause serious allergic reactions. To reduce your risk, your care team may give you other medication to take before receiving this one. Be sure to follow the directions from your care team. This medication can affect the results of blood tests to match your blood type. These changes can last for up to 6 months after the final dose. Your care team will do blood tests to match your blood type before you start treatment. Tell all of your care team that you are being treated with this medication before receiving a blood transfusion. This medication can affect the results of some tests used to determine treatment response; extra tests may be needed to evaluate response. Talk to your care team if you wish to become pregnant or think you are pregnant. This medication can cause serious birth defects if taken during pregnancy and for 3 months after the last dose. A reliable form of contraception is recommended while taking this medication and for 3 months after the last dose. Talk to your care team about effective forms of contraception. Do not breast-feed while taking this medication. What side effects may I notice from receiving this medication? Side effects that you should report to your care team as soon as possible: Allergic reactions--skin rash, itching, hives, swelling of the face, lips, tongue, or throat Heart rhythm changes--fast or irregular heartbeat, dizziness, feeling faint or lightheaded, chest pain, trouble breathing Infection--fever, chills, cough, sore throat, wounds that don't heal, pain or trouble when passing urine, general feeling of discomfort or being unwell Infusion reactions--chest pain, shortness of breath or trouble breathing, feeling faint or lightheaded Sudden eye pain or change in vision such as blurry vision, seeing halos around lights, vision loss Unusual bruising or bleeding Side effects that usually do not require medical  attention (report to your care team if they continue or are bothersome): Constipation Diarrhea Fatigue Nausea Pain, tingling, or numbness in the hands or feet Swelling of the ankles, hands, or feet This list may not describe all possible side effects. Call your doctor for medical advice about side effects. You may report side effects to FDA at 1-800-FDA-1088. Where should I keep my medication? This medication is given in a hospital or clinic. It will not be stored at home. NOTE: This sheet is a summary. It may not cover all possible information. If you have questions about this medicine, talk to your doctor, pharmacist, or health care provider.  2024 Elsevier/Gold Standard (2022-02-23 00:00:00)

## 2024-06-25 ENCOUNTER — Telehealth: Payer: Self-pay

## 2024-06-25 NOTE — Telephone Encounter (Signed)
-----   Message from Nurse Richarda POUR sent at 06/22/2024 11:23 AM EDT ----- Regarding: Dr Onesimo pt, first time Velcade  and Dara Faspro Dr Kale pt came in 8/22 for first time Velcade  and Dara Faspro injection. Tolerated well. Needs call back.

## 2024-06-25 NOTE — Telephone Encounter (Signed)
LM for patient that this nurse was calling to see how they were doing after their treatment. Please call back to Dr. Kale's nurse at [phone] if they have any questions or concerns regarding the treatment. 

## 2024-06-29 ENCOUNTER — Inpatient Hospital Stay (HOSPITAL_BASED_OUTPATIENT_CLINIC_OR_DEPARTMENT_OTHER): Admitting: Hematology

## 2024-06-29 ENCOUNTER — Inpatient Hospital Stay

## 2024-06-29 ENCOUNTER — Other Ambulatory Visit: Payer: Self-pay

## 2024-06-29 VITALS — BP 161/72 | HR 81 | Temp 97.0°F | Resp 20 | Wt 161.4 lb

## 2024-06-29 VITALS — BP 124/73 | HR 67 | Temp 97.9°F | Resp 16

## 2024-06-29 DIAGNOSIS — Z7189 Other specified counseling: Secondary | ICD-10-CM

## 2024-06-29 DIAGNOSIS — Z79899 Other long term (current) drug therapy: Secondary | ICD-10-CM | POA: Diagnosis not present

## 2024-06-29 DIAGNOSIS — I4891 Unspecified atrial fibrillation: Secondary | ICD-10-CM | POA: Diagnosis not present

## 2024-06-29 DIAGNOSIS — M549 Dorsalgia, unspecified: Secondary | ICD-10-CM | POA: Diagnosis not present

## 2024-06-29 DIAGNOSIS — C9000 Multiple myeloma not having achieved remission: Secondary | ICD-10-CM

## 2024-06-29 DIAGNOSIS — Z7901 Long term (current) use of anticoagulants: Secondary | ICD-10-CM | POA: Diagnosis not present

## 2024-06-29 DIAGNOSIS — D649 Anemia, unspecified: Secondary | ICD-10-CM | POA: Diagnosis not present

## 2024-06-29 LAB — CBC WITH DIFFERENTIAL (CANCER CENTER ONLY)
Abs Immature Granulocytes: 0 K/uL (ref 0.00–0.07)
Basophils Absolute: 0 K/uL (ref 0.0–0.1)
Basophils Relative: 0 %
Eosinophils Absolute: 0.2 K/uL (ref 0.0–0.5)
Eosinophils Relative: 5 %
HCT: 32 % — ABNORMAL LOW (ref 36.0–46.0)
Hemoglobin: 10.5 g/dL — ABNORMAL LOW (ref 12.0–15.0)
Immature Granulocytes: 0 %
Lymphocytes Relative: 22 %
Lymphs Abs: 1.1 K/uL (ref 0.7–4.0)
MCH: 31 pg (ref 26.0–34.0)
MCHC: 32.8 g/dL (ref 30.0–36.0)
MCV: 94.4 fL (ref 80.0–100.0)
Monocytes Absolute: 0.3 K/uL (ref 0.1–1.0)
Monocytes Relative: 6 %
Neutro Abs: 3.4 K/uL (ref 1.7–7.7)
Neutrophils Relative %: 67 %
Platelet Count: 203 K/uL (ref 150–400)
RBC: 3.39 MIL/uL — ABNORMAL LOW (ref 3.87–5.11)
RDW: 15.2 % (ref 11.5–15.5)
WBC Count: 5.1 K/uL (ref 4.0–10.5)
nRBC: 0 % (ref 0.0–0.2)

## 2024-06-29 LAB — CMP (CANCER CENTER ONLY)
ALT: 13 U/L (ref 0–44)
AST: 13 U/L — ABNORMAL LOW (ref 15–41)
Albumin: 4.1 g/dL (ref 3.5–5.0)
Alkaline Phosphatase: 42 U/L (ref 38–126)
Anion gap: 8 (ref 5–15)
BUN: 24 mg/dL — ABNORMAL HIGH (ref 8–23)
CO2: 29 mmol/L (ref 22–32)
Calcium: 9.6 mg/dL (ref 8.9–10.3)
Chloride: 102 mmol/L (ref 98–111)
Creatinine: 0.82 mg/dL (ref 0.44–1.00)
GFR, Estimated: >60 mL/min (ref >60)
Glucose, Bld: 113 mg/dL — ABNORMAL HIGH (ref 70–99)
Potassium: 3.9 mmol/L (ref 3.5–5.1)
Sodium: 139 mmol/L (ref 135–145)
Total Bilirubin: 0.5 mg/dL (ref 0.0–1.2)
Total Protein: 7.2 g/dL (ref 6.5–8.1)

## 2024-06-29 MED ORDER — MONTELUKAST SODIUM 10 MG PO TABS
10.0000 mg | ORAL_TABLET | Freq: Once | ORAL | Status: AC
  Administered 2024-06-29: 10 mg via ORAL
  Filled 2024-06-29: qty 1

## 2024-06-29 MED ORDER — DARATUMUMAB-HYALURONIDASE-FIHJ 1800-30000 MG-UT/15ML ~~LOC~~ SOLN
1800.0000 mg | Freq: Once | SUBCUTANEOUS | Status: AC
  Administered 2024-06-29: 1800 mg via SUBCUTANEOUS
  Filled 2024-06-29: qty 15

## 2024-06-29 MED ORDER — DEXAMETHASONE 4 MG PO TABS
20.0000 mg | ORAL_TABLET | Freq: Once | ORAL | Status: AC
  Administered 2024-06-29: 20 mg via ORAL
  Filled 2024-06-29: qty 5

## 2024-06-29 MED ORDER — ACETAMINOPHEN 325 MG PO TABS
650.0000 mg | ORAL_TABLET | Freq: Once | ORAL | Status: AC
  Administered 2024-06-29: 650 mg via ORAL
  Filled 2024-06-29: qty 2

## 2024-06-29 MED ORDER — FAMOTIDINE 20 MG PO TABS
20.0000 mg | ORAL_TABLET | Freq: Once | ORAL | Status: AC
  Administered 2024-06-29: 20 mg via ORAL
  Filled 2024-06-29: qty 1

## 2024-06-29 MED ORDER — BORTEZOMIB CHEMO SQ INJECTION 3.5 MG (2.5MG/ML)
1.3000 mg/m2 | Freq: Once | INTRAMUSCULAR | Status: AC
  Administered 2024-06-29: 2.25 mg via SUBCUTANEOUS
  Filled 2024-06-29: qty 0.9

## 2024-06-29 MED ORDER — DIPHENHYDRAMINE HCL 25 MG PO CAPS
50.0000 mg | ORAL_CAPSULE | Freq: Once | ORAL | Status: AC
  Administered 2024-06-29: 50 mg via ORAL
  Filled 2024-06-29: qty 2

## 2024-06-29 MED ORDER — ESOMEPRAZOLE MAGNESIUM 40 MG PO CPDR: 40.0000 mg | DELAYED_RELEASE_CAPSULE | Freq: Every day | ORAL | 2 refills | Status: DC

## 2024-06-29 NOTE — Progress Notes (Signed)
 Due to issues with lab- Dr. Onesimo is ok with proceeding with treatment without the CMP results.  Patient tolerated treatment well and stayed for her hour observation with no issues. VSS- BP 124/73 (BP Location: Left Arm, Patient Position: Sitting)   Pulse 67   Temp 97.9 F (36.6 C) (Oral)   Resp 16   SpO2 100%   Ambulatory to the lobby with her boyfriend.

## 2024-06-29 NOTE — Patient Instructions (Signed)
 [facility] - A DEPT OF [facility]  Discharge Instructions: Thank you for choosing [facility] to provide your oncology and hematology care.   If you have a lab appointment with the Cancer Center, please go directly to the Cancer Center and check in at the registration area.   Wear comfortable clothing and clothing appropriate for easy access to any Portacath or PICC line.   We strive to give you quality time with your provider. You may need to reschedule your appointment if you arrive late (15 or more minutes).  Arriving late affects you and other patients whose appointments are after yours.  Also, if you miss three or more appointments without notifying the office, you may be dismissed from the clinic at the provider's discretion.      For prescription refill requests, have your pharmacy contact our office and allow 72 hours for refills to be completed.    Today you received the following chemotherapy and/or immunotherapy agents: Velcade , Darzalex  Faspro    To help prevent nausea and vomiting after your treatment, we encourage you to take your nausea medication as directed.  BELOW ARE SYMPTOMS THAT SHOULD BE REPORTED IMMEDIATELY: *FEVER GREATER THAN 100.4 F (38 C) OR HIGHER *CHILLS OR SWEATING *NAUSEA AND VOMITING THAT IS NOT CONTROLLED WITH YOUR NAUSEA MEDICATION *UNUSUAL SHORTNESS OF BREATH *UNUSUAL BRUISING OR BLEEDING *URINARY PROBLEMS (pain or burning when urinating, or frequent urination) *BOWEL PROBLEMS (unusual diarrhea, constipation, pain near the anus) TENDERNESS IN MOUTH AND THROAT WITH OR WITHOUT PRESENCE OF ULCERS (sore throat, sores in mouth, or a toothache) UNUSUAL RASH, SWELLING OR PAIN  UNUSUAL VAGINAL DISCHARGE OR ITCHING   Items with * indicate a potential emergency and should be followed up as soon as possible or go to the Emergency Department if any problems should occur.  Please show the CHEMOTHERAPY ALERT CARD or  IMMUNOTHERAPY ALERT CARD at check-in to the Emergency Department and triage nurse.  Should you have questions after your visit or need to cancel or reschedule your appointment, please contact [facility] - A DEPT OF [facility]  Dept: [phone]  and follow the prompts.  Office hours are 8:00 a.m. to 4:30 p.m. Monday - Friday. Please note that voicemails left after 4:00 p.m. may not be returned until the following business day.  We are closed weekends and major holidays. You have access to a nurse at all times for urgent questions. Please call the main number to the clinic Dept: [phone] and follow the prompts.   For any non-urgent questions, you may also contact your provider using MyChart. We now offer e-Visits for anyone 40 and older to request care online for non-urgent symptoms. For details visit [email or web address].   Also download the MyChart app! Go to the app store, search MyChart, open the app, select [facility], and log in with your MyChart username and password.  Bortezomib  Injection What is this medication? BORTEZOMIB  (bor TEZ oh mib) treats lymphoma. It may also be used to treat multiple myeloma, a type of bone marrow cancer. It works by blocking a protein that causes cancer cells to grow and multiply. This helps to slow or stop the spread of cancer cells. This medicine may be used for other purposes; ask your health care provider or pharmacist if you have questions. COMMON BRAND NAME(S): BORUZU , Velcade  What should I tell my care team before I take this medication? They need to  know if you have any of these conditions: Dehydration Diabetes Heart disease Liver disease Tingling of the fingers or toes or other nerve disorder An unusual or allergic reaction to bortezomib , other medications, foods, dyes, or preservatives If you or your partner are pregnant or trying to get pregnant Breastfeeding How should I use this medication? This  medication is injected into a vein or under the skin. It is given by your care team in a hospital or clinic setting. Talk to your care team about the use of this medication in children. Special care may be needed. Overdosage: If you think you have taken too much of this medicine contact a poison control center or emergency room at once. NOTE: This medicine is only for you. Do not share this medicine with others. What if I miss a dose? Keep appointments for follow-up doses. It is important not to miss your dose. Call your care team if you are unable to keep an appointment. What may interact with this medication? Ketoconazole Rifampin This list may not describe all possible interactions. Give your health care provider a list of all the medicines, herbs, non-prescription drugs, or dietary supplements you use. Also tell them if you smoke, drink alcohol , or use illegal drugs. Some items may interact with your medicine. What should I watch for while using this medication? Your condition will be monitored carefully while you are receiving this medication. You may need blood work while taking this medication. This medication may affect your coordination, reaction time, or judgment. Do not drive or operate machinery until you know how this medication affects you. Sit up or stand slowly to reduce the risk of dizzy or fainting spells. Drinking alcohol  with this medication can increase the risk of these side effects. This medication may increase your risk of getting an infection. Call your care team for advice if you get a fever, chills, sore throat, or other symptoms of a cold or flu. Do not treat yourself. Try to avoid being around people who are sick. Check with your care team if you have severe diarrhea, nausea, and vomiting, or if you sweat a lot. The loss of too much body fluid may make it dangerous for you to take this medication. Talk to your care team if you may be pregnant. Serious birth defects can  occur if you take this medication during pregnancy and for 7 months after the last dose. You will need a negative pregnancy test before starting this medication. Contraception is recommended while taking this medication and for 7 months after the last dose. Your care team can help you find the option that works for you. If your partner can get pregnant, use a condom during sex while taking this medication and for 4 months after the last dose. Do not breastfeed while taking this medication and for 2 months after the last dose. This medication may cause infertility. Talk to your care team if you are concerned about your fertility. What side effects may I notice from receiving this medication? Side effects that you should report to your care team as soon as possible: Allergic reactions--skin rash, itching, hives, swelling of the face, lips, tongue, or throat Bleeding--bloody or black, tar-like stools, vomiting blood or brown material that looks like coffee grounds, red or dark brown urine, small red or purple spots on skin, unusual bruising or bleeding Bleeding in the brain--severe headache, stiff neck, confusion, dizziness, change in vision, numbness or weakness of the face, arm, or leg, trouble speaking, trouble walking,  vomiting Bowel blockage--stomach cramping, unable to have a bowel movement or pass gas, loss of appetite, vomiting Heart failure--shortness of breath, swelling of the ankles, feet, or hands, sudden weight gain, unusual weakness or fatigue Infection--fever, chills, cough, sore throat, wounds that don't heal, pain or trouble when passing urine, general feeling of discomfort or being unwell Liver injury--right upper belly pain, loss of appetite, nausea, light-colored stool, dark yellow or brown urine, yellowing skin or eyes, unusual weakness or fatigue Low blood pressure--dizziness, feeling faint or lightheaded, blurry vision Lung injury--shortness of breath or trouble breathing, cough,  spitting up blood, chest pain, fever Pain, tingling, or numbness in the hands or feet Severe or prolonged diarrhea Stomach pain, bloody diarrhea, pale skin, unusual weakness or fatigue, decrease in the amount of urine, which may be signs of hemolytic uremic syndrome Sudden and severe headache, confusion, change in vision, seizures, which may be signs of posterior reversible encephalopathy syndrome (PRES) TTP--purple spots on the skin or inside the mouth, pale skin, yellowing skin or eyes, unusual weakness or fatigue, fever, fast or irregular heartbeat, confusion, change in vision, trouble speaking, trouble walking Tumor lysis syndrome (TLS)--nausea, vomiting, diarrhea, decrease in the amount of urine, dark urine, unusual weakness or fatigue, confusion, muscle pain or cramps, fast or irregular heartbeat, joint pain Side effects that usually do not require medical attention (report to your care team if they continue or are bothersome): Constipation Diarrhea Fatigue Loss of appetite Nausea This list may not describe all possible side effects. Call your doctor for medical advice about side effects. You may report side effects to FDA at 1-800-FDA-1088. Where should I keep my medication? This medication is given in a hospital or clinic. It will not be stored at home. NOTE: This sheet is a summary. It may not cover all possible information. If you have questions about this medicine, talk to your doctor, pharmacist, or health care provider.  2024 Elsevier/Gold Standard (2022-03-23 00:00:00) Daratumumab ; Hyaluronidase  Injection What is this medication? DARATUMUMAB ; HYALURONIDASE  (dar a toom ue mab; hye al ur ON i dase) treats multiple myeloma, a type of bone marrow cancer. Daratumumab  works by blocking a protein that causes cancer cells to grow and multiply. This helps to slow or stop the spread of cancer cells. Hyaluronidase  works by increasing the absorption of other medications in the body to help  them work better. This medication may also be used treat amyloidosis, a condition that causes the buildup of a protein (amyloid) in your body. It works by reducing the buildup of this protein, which decreases symptoms. It is a combination medication that contains a monoclonal antibody. This medicine may be used for other purposes; ask your health care provider or pharmacist if you have questions. COMMON BRAND NAME(S): DARZALEX  FASPRO What should I tell my care team before I take this medication? They need to know if you have any of these conditions: Heart disease Infection, such as chickenpox, cold sores, herpes, hepatitis B Lung or breathing disease An unusual or allergic reaction to daratumumab , hyaluronidase , other medications, foods, dyes, or preservatives Pregnant or trying to get pregnant Breast-feeding How should I use this medication? This medication is injected under the skin. It is given by your care team in a hospital or clinic setting. Talk to your care team about the use of this medication in children. Special care may be needed. Overdosage: If you think you have taken too much of this medicine contact a poison control center or emergency room at once. NOTE: This medicine  is only for you. Do not share this medicine with others. What if I miss a dose? Keep appointments for follow-up doses. It is important not to miss your dose. Call your care team if you are unable to keep an appointment. What may interact with this medication? Interactions have not been studied. This list may not describe all possible interactions. Give your health care provider a list of all the medicines, herbs, non-prescription drugs, or dietary supplements you use. Also tell them if you smoke, drink alcohol , or use illegal drugs. Some items may interact with your medicine. What should I watch for while using this medication? Your condition will be monitored carefully while you are receiving this  medication. This medication can cause serious allergic reactions. To reduce your risk, your care team may give you other medication to take before receiving this one. Be sure to follow the directions from your care team. This medication can affect the results of blood tests to match your blood type. These changes can last for up to 6 months after the final dose. Your care team will do blood tests to match your blood type before you start treatment. Tell all of your care team that you are being treated with this medication before receiving a blood transfusion. This medication can affect the results of some tests used to determine treatment response; extra tests may be needed to evaluate response. Talk to your care team if you wish to become pregnant or think you are pregnant. This medication can cause serious birth defects if taken during pregnancy and for 3 months after the last dose. A reliable form of contraception is recommended while taking this medication and for 3 months after the last dose. Talk to your care team about effective forms of contraception. Do not breast-feed while taking this medication. What side effects may I notice from receiving this medication? Side effects that you should report to your care team as soon as possible: Allergic reactions--skin rash, itching, hives, swelling of the face, lips, tongue, or throat Heart rhythm changes--fast or irregular heartbeat, dizziness, feeling faint or lightheaded, chest pain, trouble breathing Infection--fever, chills, cough, sore throat, wounds that don't heal, pain or trouble when passing urine, general feeling of discomfort or being unwell Infusion reactions--chest pain, shortness of breath or trouble breathing, feeling faint or lightheaded Sudden eye pain or change in vision such as blurry vision, seeing halos around lights, vision loss Unusual bruising or bleeding Side effects that usually do not require medical attention (report to your  care team if they continue or are bothersome): Constipation Diarrhea Fatigue Nausea Pain, tingling, or numbness in the hands or feet Swelling of the ankles, hands, or feet This list may not describe all possible side effects. Call your doctor for medical advice about side effects. You may report side effects to FDA at 1-800-FDA-1088. Where should I keep my medication? This medication is given in a hospital or clinic. It will not be stored at home. NOTE: This sheet is a summary. It may not cover all possible information. If you have questions about this medicine, talk to your doctor, pharmacist, or health care provider.  2024 Elsevier/Gold Standard (2022-02-23 00:00:00)

## 2024-07-05 ENCOUNTER — Encounter: Payer: Self-pay | Admitting: Hematology

## 2024-07-05 ENCOUNTER — Inpatient Hospital Stay: Attending: Hematology

## 2024-07-05 ENCOUNTER — Inpatient Hospital Stay

## 2024-07-05 VITALS — BP 149/76 | HR 72 | Temp 98.1°F | Resp 16 | Wt 168.5 lb

## 2024-07-05 DIAGNOSIS — Z7189 Other specified counseling: Secondary | ICD-10-CM

## 2024-07-05 DIAGNOSIS — Z79899 Other long term (current) drug therapy: Secondary | ICD-10-CM | POA: Insufficient documentation

## 2024-07-05 DIAGNOSIS — C9000 Multiple myeloma not having achieved remission: Secondary | ICD-10-CM | POA: Insufficient documentation

## 2024-07-05 DIAGNOSIS — Z7952 Long term (current) use of systemic steroids: Secondary | ICD-10-CM | POA: Insufficient documentation

## 2024-07-05 DIAGNOSIS — Z5111 Encounter for antineoplastic chemotherapy: Secondary | ICD-10-CM | POA: Insufficient documentation

## 2024-07-05 LAB — CMP (CANCER CENTER ONLY)
ALT: 11 U/L (ref 0–44)
AST: 12 U/L — ABNORMAL LOW (ref 15–41)
Albumin: 4 g/dL (ref 3.5–5.0)
Alkaline Phosphatase: 42 U/L (ref 38–126)
Anion gap: 5 (ref 5–15)
BUN: 26 mg/dL — ABNORMAL HIGH (ref 8–23)
CO2: 29 mmol/L (ref 22–32)
Calcium: 9 mg/dL (ref 8.9–10.3)
Chloride: 105 mmol/L (ref 98–111)
Creatinine: 0.75 mg/dL (ref 0.44–1.00)
GFR, Estimated: >60 mL/min (ref >60)
Glucose, Bld: 99 mg/dL (ref 70–99)
Potassium: 4.5 mmol/L (ref 3.5–5.1)
Sodium: 139 mmol/L (ref 135–145)
Total Bilirubin: 0.4 mg/dL (ref 0.0–1.2)
Total Protein: 6.9 g/dL (ref 6.5–8.1)

## 2024-07-05 LAB — CBC WITH DIFFERENTIAL (CANCER CENTER ONLY)
Abs Immature Granulocytes: 0.02 K/uL (ref 0.00–0.07)
Basophils Absolute: 0 K/uL (ref 0.0–0.1)
Basophils Relative: 0 %
Eosinophils Absolute: 0.2 K/uL (ref 0.0–0.5)
Eosinophils Relative: 3 %
HCT: 30.6 % — ABNORMAL LOW (ref 36.0–46.0)
Hemoglobin: 10.1 g/dL — ABNORMAL LOW (ref 12.0–15.0)
Immature Granulocytes: 0 %
Lymphocytes Relative: 20 %
Lymphs Abs: 1.4 K/uL (ref 0.7–4.0)
MCH: 31.3 pg (ref 26.0–34.0)
MCHC: 33 g/dL (ref 30.0–36.0)
MCV: 94.7 fL (ref 80.0–100.0)
Monocytes Absolute: 0.4 K/uL (ref 0.1–1.0)
Monocytes Relative: 6 %
Neutro Abs: 5.1 K/uL (ref 1.7–7.7)
Neutrophils Relative %: 71 %
Platelet Count: 182 K/uL (ref 150–400)
RBC: 3.23 MIL/uL — ABNORMAL LOW (ref 3.87–5.11)
RDW: 15.4 % (ref 11.5–15.5)
WBC Count: 7.1 K/uL (ref 4.0–10.5)
nRBC: 0 % (ref 0.0–0.2)

## 2024-07-05 MED ORDER — FAMOTIDINE 20 MG PO TABS
20.0000 mg | ORAL_TABLET | Freq: Once | ORAL | Status: AC
  Administered 2024-07-05: 20 mg via ORAL
  Filled 2024-07-05: qty 1

## 2024-07-05 MED ORDER — DIPHENHYDRAMINE HCL 25 MG PO CAPS
50.0000 mg | ORAL_CAPSULE | Freq: Once | ORAL | Status: AC
  Administered 2024-07-05: 50 mg via ORAL
  Filled 2024-07-05: qty 2

## 2024-07-05 MED ORDER — DARATUMUMAB-HYALURONIDASE-FIHJ 1800-30000 MG-UT/15ML ~~LOC~~ SOLN
1800.0000 mg | Freq: Once | SUBCUTANEOUS | Status: AC
  Administered 2024-07-05: 1800 mg via SUBCUTANEOUS
  Filled 2024-07-05: qty 15

## 2024-07-05 MED ORDER — ACETAMINOPHEN 325 MG PO TABS
650.0000 mg | ORAL_TABLET | Freq: Once | ORAL | Status: AC
  Administered 2024-07-05: 650 mg via ORAL
  Filled 2024-07-05: qty 2

## 2024-07-05 MED ORDER — MONTELUKAST SODIUM 10 MG PO TABS
10.0000 mg | ORAL_TABLET | Freq: Once | ORAL | Status: AC
  Administered 2024-07-05: 10 mg via ORAL
  Filled 2024-07-05: qty 1

## 2024-07-05 MED ORDER — DEXAMETHASONE 4 MG PO TABS
20.0000 mg | ORAL_TABLET | Freq: Once | ORAL | Status: AC
  Administered 2024-07-05: 20 mg via ORAL
  Filled 2024-07-05: qty 5

## 2024-07-05 MED ORDER — BORTEZOMIB CHEMO SQ INJECTION 3.5 MG (2.5MG/ML)
1.3000 mg/m2 | Freq: Once | INTRAMUSCULAR | Status: AC
  Administered 2024-07-05: 2.25 mg via SUBCUTANEOUS
  Filled 2024-07-05: qty 0.9

## 2024-07-05 NOTE — Patient Instructions (Signed)
 [facility] - A DEPT OF [facility]  Discharge Instructions: Thank you for choosing [facility] to provide your oncology and hematology care.   If you have a lab appointment with the Cancer Center, please go directly to the Cancer Center and check in at the registration area.   Wear comfortable clothing and clothing appropriate for easy access to any Portacath or PICC line.   We strive to give you quality time with your provider. You may need to reschedule your appointment if you arrive late (15 or more minutes).  Arriving late affects you and other patients whose appointments are after yours.  Also, if you miss three or more appointments without notifying the office, you may be dismissed from the clinic at the provider's discretion.      For prescription refill requests, have your pharmacy contact our office and allow 72 hours for refills to be completed.    Today you received the following chemotherapy and/or immunotherapy agents: Velcade , Darzalex  Faspro    To help prevent nausea and vomiting after your treatment, we encourage you to take your nausea medication as directed.  BELOW ARE SYMPTOMS THAT SHOULD BE REPORTED IMMEDIATELY: *FEVER GREATER THAN 100.4 F (38 C) OR HIGHER *CHILLS OR SWEATING *NAUSEA AND VOMITING THAT IS NOT CONTROLLED WITH YOUR NAUSEA MEDICATION *UNUSUAL SHORTNESS OF BREATH *UNUSUAL BRUISING OR BLEEDING *URINARY PROBLEMS (pain or burning when urinating, or frequent urination) *BOWEL PROBLEMS (unusual diarrhea, constipation, pain near the anus) TENDERNESS IN MOUTH AND THROAT WITH OR WITHOUT PRESENCE OF ULCERS (sore throat, sores in mouth, or a toothache) UNUSUAL RASH, SWELLING OR PAIN  UNUSUAL VAGINAL DISCHARGE OR ITCHING   Items with * indicate a potential emergency and should be followed up as soon as possible or go to the Emergency Department if any problems should occur.  Please show the CHEMOTHERAPY ALERT CARD or  IMMUNOTHERAPY ALERT CARD at check-in to the Emergency Department and triage nurse.  Should you have questions after your visit or need to cancel or reschedule your appointment, please contact [facility] - A DEPT OF [facility]  Dept: [phone]  and follow the prompts.  Office hours are 8:00 a.m. to 4:30 p.m. Monday - Friday. Please note that voicemails left after 4:00 p.m. may not be returned until the following business day.  We are closed weekends and major holidays. You have access to a nurse at all times for urgent questions. Please call the main number to the clinic Dept: [phone] and follow the prompts.   For any non-urgent questions, you may also contact your provider using MyChart. We now offer e-Visits for anyone 40 and older to request care online for non-urgent symptoms. For details visit [email or web address].   Also download the MyChart app! Go to the app store, search MyChart, open the app, select [facility], and log in with your MyChart username and password.  Bortezomib  Injection What is this medication? BORTEZOMIB  (bor TEZ oh mib) treats lymphoma. It may also be used to treat multiple myeloma, a type of bone marrow cancer. It works by blocking a protein that causes cancer cells to grow and multiply. This helps to slow or stop the spread of cancer cells. This medicine may be used for other purposes; ask your health care provider or pharmacist if you have questions. COMMON BRAND NAME(S): BORUZU , Velcade  What should I tell my care team before I take this medication? They need to  know if you have any of these conditions: Dehydration Diabetes Heart disease Liver disease Tingling of the fingers or toes or other nerve disorder An unusual or allergic reaction to bortezomib , other medications, foods, dyes, or preservatives If you or your partner are pregnant or trying to get pregnant Breastfeeding How should I use this medication? This  medication is injected into a vein or under the skin. It is given by your care team in a hospital or clinic setting. Talk to your care team about the use of this medication in children. Special care may be needed. Overdosage: If you think you have taken too much of this medicine contact a poison control center or emergency room at once. NOTE: This medicine is only for you. Do not share this medicine with others. What if I miss a dose? Keep appointments for follow-up doses. It is important not to miss your dose. Call your care team if you are unable to keep an appointment. What may interact with this medication? Ketoconazole Rifampin This list may not describe all possible interactions. Give your health care provider a list of all the medicines, herbs, non-prescription drugs, or dietary supplements you use. Also tell them if you smoke, drink alcohol , or use illegal drugs. Some items may interact with your medicine. What should I watch for while using this medication? Your condition will be monitored carefully while you are receiving this medication. You may need blood work while taking this medication. This medication may affect your coordination, reaction time, or judgment. Do not drive or operate machinery until you know how this medication affects you. Sit up or stand slowly to reduce the risk of dizzy or fainting spells. Drinking alcohol  with this medication can increase the risk of these side effects. This medication may increase your risk of getting an infection. Call your care team for advice if you get a fever, chills, sore throat, or other symptoms of a cold or flu. Do not treat yourself. Try to avoid being around people who are sick. Check with your care team if you have severe diarrhea, nausea, and vomiting, or if you sweat a lot. The loss of too much body fluid may make it dangerous for you to take this medication. Talk to your care team if you may be pregnant. Serious birth defects can  occur if you take this medication during pregnancy and for 7 months after the last dose. You will need a negative pregnancy test before starting this medication. Contraception is recommended while taking this medication and for 7 months after the last dose. Your care team can help you find the option that works for you. If your partner can get pregnant, use a condom during sex while taking this medication and for 4 months after the last dose. Do not breastfeed while taking this medication and for 2 months after the last dose. This medication may cause infertility. Talk to your care team if you are concerned about your fertility. What side effects may I notice from receiving this medication? Side effects that you should report to your care team as soon as possible: Allergic reactions--skin rash, itching, hives, swelling of the face, lips, tongue, or throat Bleeding--bloody or black, tar-like stools, vomiting blood or brown material that looks like coffee grounds, red or dark brown urine, small red or purple spots on skin, unusual bruising or bleeding Bleeding in the brain--severe headache, stiff neck, confusion, dizziness, change in vision, numbness or weakness of the face, arm, or leg, trouble speaking, trouble walking,  vomiting Bowel blockage--stomach cramping, unable to have a bowel movement or pass gas, loss of appetite, vomiting Heart failure--shortness of breath, swelling of the ankles, feet, or hands, sudden weight gain, unusual weakness or fatigue Infection--fever, chills, cough, sore throat, wounds that don't heal, pain or trouble when passing urine, general feeling of discomfort or being unwell Liver injury--right upper belly pain, loss of appetite, nausea, light-colored stool, dark yellow or brown urine, yellowing skin or eyes, unusual weakness or fatigue Low blood pressure--dizziness, feeling faint or lightheaded, blurry vision Lung injury--shortness of breath or trouble breathing, cough,  spitting up blood, chest pain, fever Pain, tingling, or numbness in the hands or feet Severe or prolonged diarrhea Stomach pain, bloody diarrhea, pale skin, unusual weakness or fatigue, decrease in the amount of urine, which may be signs of hemolytic uremic syndrome Sudden and severe headache, confusion, change in vision, seizures, which may be signs of posterior reversible encephalopathy syndrome (PRES) TTP--purple spots on the skin or inside the mouth, pale skin, yellowing skin or eyes, unusual weakness or fatigue, fever, fast or irregular heartbeat, confusion, change in vision, trouble speaking, trouble walking Tumor lysis syndrome (TLS)--nausea, vomiting, diarrhea, decrease in the amount of urine, dark urine, unusual weakness or fatigue, confusion, muscle pain or cramps, fast or irregular heartbeat, joint pain Side effects that usually do not require medical attention (report to your care team if they continue or are bothersome): Constipation Diarrhea Fatigue Loss of appetite Nausea This list may not describe all possible side effects. Call your doctor for medical advice about side effects. You may report side effects to FDA at 1-800-FDA-1088. Where should I keep my medication? This medication is given in a hospital or clinic. It will not be stored at home. NOTE: This sheet is a summary. It may not cover all possible information. If you have questions about this medicine, talk to your doctor, pharmacist, or health care provider.  2024 Elsevier/Gold Standard (2022-03-23 00:00:00) Daratumumab ; Hyaluronidase  Injection What is this medication? DARATUMUMAB ; HYALURONIDASE  (dar a toom ue mab; hye al ur ON i dase) treats multiple myeloma, a type of bone marrow cancer. Daratumumab  works by blocking a protein that causes cancer cells to grow and multiply. This helps to slow or stop the spread of cancer cells. Hyaluronidase  works by increasing the absorption of other medications in the body to help  them work better. This medication may also be used treat amyloidosis, a condition that causes the buildup of a protein (amyloid) in your body. It works by reducing the buildup of this protein, which decreases symptoms. It is a combination medication that contains a monoclonal antibody. This medicine may be used for other purposes; ask your health care provider or pharmacist if you have questions. COMMON BRAND NAME(S): DARZALEX  FASPRO What should I tell my care team before I take this medication? They need to know if you have any of these conditions: Heart disease Infection, such as chickenpox, cold sores, herpes, hepatitis B Lung or breathing disease An unusual or allergic reaction to daratumumab , hyaluronidase , other medications, foods, dyes, or preservatives Pregnant or trying to get pregnant Breast-feeding How should I use this medication? This medication is injected under the skin. It is given by your care team in a hospital or clinic setting. Talk to your care team about the use of this medication in children. Special care may be needed. Overdosage: If you think you have taken too much of this medicine contact a poison control center or emergency room at once. NOTE: This medicine  is only for you. Do not share this medicine with others. What if I miss a dose? Keep appointments for follow-up doses. It is important not to miss your dose. Call your care team if you are unable to keep an appointment. What may interact with this medication? Interactions have not been studied. This list may not describe all possible interactions. Give your health care provider a list of all the medicines, herbs, non-prescription drugs, or dietary supplements you use. Also tell them if you smoke, drink alcohol , or use illegal drugs. Some items may interact with your medicine. What should I watch for while using this medication? Your condition will be monitored carefully while you are receiving this  medication. This medication can cause serious allergic reactions. To reduce your risk, your care team may give you other medication to take before receiving this one. Be sure to follow the directions from your care team. This medication can affect the results of blood tests to match your blood type. These changes can last for up to 6 months after the final dose. Your care team will do blood tests to match your blood type before you start treatment. Tell all of your care team that you are being treated with this medication before receiving a blood transfusion. This medication can affect the results of some tests used to determine treatment response; extra tests may be needed to evaluate response. Talk to your care team if you wish to become pregnant or think you are pregnant. This medication can cause serious birth defects if taken during pregnancy and for 3 months after the last dose. A reliable form of contraception is recommended while taking this medication and for 3 months after the last dose. Talk to your care team about effective forms of contraception. Do not breast-feed while taking this medication. What side effects may I notice from receiving this medication? Side effects that you should report to your care team as soon as possible: Allergic reactions--skin rash, itching, hives, swelling of the face, lips, tongue, or throat Heart rhythm changes--fast or irregular heartbeat, dizziness, feeling faint or lightheaded, chest pain, trouble breathing Infection--fever, chills, cough, sore throat, wounds that don't heal, pain or trouble when passing urine, general feeling of discomfort or being unwell Infusion reactions--chest pain, shortness of breath or trouble breathing, feeling faint or lightheaded Sudden eye pain or change in vision such as blurry vision, seeing halos around lights, vision loss Unusual bruising or bleeding Side effects that usually do not require medical attention (report to your  care team if they continue or are bothersome): Constipation Diarrhea Fatigue Nausea Pain, tingling, or numbness in the hands or feet Swelling of the ankles, hands, or feet This list may not describe all possible side effects. Call your doctor for medical advice about side effects. You may report side effects to FDA at 1-800-FDA-1088. Where should I keep my medication? This medication is given in a hospital or clinic. It will not be stored at home. NOTE: This sheet is a summary. It may not cover all possible information. If you have questions about this medicine, talk to your doctor, pharmacist, or health care provider.  2024 Elsevier/Gold Standard (2022-02-23 00:00:00)

## 2024-07-05 NOTE — Progress Notes (Signed)
 HEMATOLOGY/ONCOLOGY CLINIC VISIT NOTE  Date of Service: .06/29/2024   Patient Care Team: Larnell Hamilton, MD as PCP - General (Internal Medicine) Lavona Agent, MD as PCP - Cardiology (Cardiology) Cindie Ole DASEN, MD as PCP - Electrophysiology (Cardiology) Danielle Rom, MD as Consulting Physician (Obstetrics and Gynecology) Key, Hargis HERO, NP as Nurse Practitioner (Gynecology) Lavona Agent, MD as Consulting Physician (Cardiology) Onesimo Emaline Brink, MD as Consulting Physician (Hematology)  CHIEF COMPLAINTS/PURPOSE OF CONSULTATION:  Follow-up for newly diagnosed active multiple myeloma and for toxicity check after cycle 1 day 1 of treatment  HISTORY OF PRESENTING ILLNESS:   Mary Potter is a wonderful 78 y.o. 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  Today, she is accompanied by her daughter and son-in-law. Patient has had two vertebral compression fractures in her L-spine. She reports that her initial back fracture was from pulling too hard while adjusting her shoes, and the second occurred after pushing on a trash can lid.   Her back pain continues to be bothersome, though it has improved. She did previously have mild pain raidating to her left lower extremity, which has resolved. Back brace does improve back pain.  The first time she had a compression fracture, she was told that she was developing the beginnings of osteopenia. Patient reports that she did not tolerate Fosamax and she stopped taking it. She has not had a bone density study recently. Her last bone density study from 2018 showed osteopenia. Patient has never been on prolia  previously, though there are considerations to start it soon.   She reports having a muscle spasm on her left side, which is settling with muscle relaxants. She denies any leg swelling.   Patient reports a history of kidney stones, during which  she had protein in her urine.  Patient has regularly been on vitamin D . She has been off of calcium  for several years ago because of her fhx of heart disease.   INTERVAL HISTORY:  Mary Potter is a wonderful 78 y.o. female who is here for continued evaluation and management of her multiple myeloma and for toxicity check after having started her first cycle of daratumumab  Velcade  dexamethasone  treatment. She notes no acute new symptoms since her last clinic visit. No new significant fatigue, no bleeding issues, no infections. Trace leg swelling from steroids and some difficulty with sleeping. Grade 1 fatigue. No other acute new toxicities noted. Labs done today were discussed with her in details.  MEDICAL HISTORY:  Past Medical History:  Diagnosis Date   Atrial fibrillation (HCC)    Dysrhythmia    A-fib   Endometrial hyperplasia 01/03/2015   GERD (gastroesophageal reflux disease)    History of kidney stones    Hyperlipidemia    Hypertension    Pneumonia    PONV (postoperative nausea and vomiting)    slow to wake up   Thickened endometrium 01/03/2015   Vaginal delivery 1976, 1985    SURGICAL HISTORY: Past Surgical History:  Procedure Laterality Date   ABDOMINAL HYSTERECTOMY     ANKLE FRACTURE SURGERY     ATRIAL FIBRILLATION ABLATION N/A 02/08/2022   Procedure: ATRIAL FIBRILLATION ABLATION;  Surgeon: Cindie Ole DASEN, MD;  Location: [facility];  Service: Cardiovascular;  Laterality: N/A;   CARDIOVERSION N/A 02/17/2023   Procedure: CARDIOVERSION;  Surgeon: Sheena Pugh, DO;  Location: [facility];  Service: Cardiovascular;  Laterality: N/A;   CATARACT EXTRACTION Right  2020   DILATION AND CURETTAGE OF UTERUS  2012   ablation    EYE SURGERY     Corneal growth removal on right eye   HYSTEROSCOPY WITH D & C N/A 01/03/2015   Procedure: DILATATION AND CURETTAGE /HYSTEROSCOPY;  Surgeon: Robbi Render, MD;  Location: [facility];  Service: Gynecology;  Laterality: N/A;    LAPAROSCOPY     for endometriosis   ROBOTIC ASSISTED TOTAL HYSTERECTOMY WITH BILATERAL SALPINGO OOPHERECTOMY N/A 03/17/2021   Procedure: XI ROBOTIC ASSISTED TOTAL HYSTERECTOMY WITH BILATERAL SALPINGO OOPHORECTOMY;  Surgeon: Viktoria Comer SAUNDERS, MD;  Location: [facility];  Service: Gynecology;  Laterality: N/A;   SENTINEL NODE BIOPSY N/A 03/17/2021   Procedure: SENTINEL NODE BIOPSY;  Surgeon: Viktoria Comer SAUNDERS, MD;  Location: [facility];  Service: Gynecology;  Laterality: N/A;   TONSILLECTOMY AND ADENOIDECTOMY      SOCIAL HISTORY: Social History   Socioeconomic History   Marital status: Widowed    Spouse name: Sharolyn   Number of children: 2   Years of education: 14   Highest education level: Not on file  Occupational History   Occupation: retired    Comment: [employer]  Tobacco Use   Smoking status: Never    Passive exposure: Past   Smokeless tobacco: Never   Tobacco comments:    Never smoke 03/02/23  Vaping Use   Vaping status: Never Used  Substance and Sexual Activity   Alcohol  use: No    Alcohol /week: 0.0 standard drinks of alcohol    Drug use: No   Sexual activity: Yes    Birth control/protection: Other-see comments    Comment: Hysterectomy  Other Topics Concern   Not on file  Social History Narrative   Lives with her younger daughter, [name]. Her older daughter lives in [place] with her family.   Social Drivers of Corporate investment banker Strain: Not on file  Food Insecurity: No Food Insecurity (06/19/2024)   Hunger Vital Sign    Worried About Running Out of Food in the Last Year: Never true    Ran Out of Food in the Last Year: Never true  Transportation Needs: No Transportation Needs (06/19/2024)   PRAPARE - Administrator, Civil Service (Medical): No    Lack of Transportation (Non-Medical): No  Physical Activity: Not on file  Stress: Not on file  Social Connections: Not on file  Intimate Partner Violence: Not At Risk (06/19/2024)   Humiliation,  Afraid, Rape, and Kick questionnaire    Fear of Current or Ex-Partner: No    Emotionally Abused: No    Physically Abused: No    Sexually Abused: No    FAMILY HISTORY: Family History  Problem Relation Age of Onset   Heart disease Mother 28       CABG   Stroke Father    Prostate cancer Father    Heart disease Brother 31       Transplant, died 10 years   Heart disease Maternal Grandmother    Heart disease Paternal Grandmother    Cancer Paternal Grandfather    Heart disease Brother 2       RF   Leukemia Brother    Leukemia Brother    Hypertension Sister    Cervical cancer Sister    Colon cancer Neg Hx    Breast cancer Neg Hx    Ovarian cancer Neg Hx    Endometrial cancer Neg Hx    Pancreatic cancer Neg Hx     ALLERGIES:  is  allergic to talwin [pentazocine], benadryl  [diphenhydramine ], hydrocodone, and prednisone .  MEDICATIONS:  Current Outpatient Medications  Medication Sig Dispense Refill   acetaminophen  (TYLENOL ) 500 MG tablet Take 1,000 mg by mouth every 6 (six) hours as needed (Arthritis Pain).     acyclovir  (ZOVIRAX ) 400 MG tablet Take 1 tablet (400 mg total) by mouth 2 (two) times daily. 60 tablet 5   ALPRAZolam (XANAX) 0.5 MG tablet Take 0.25 mg by mouth daily as needed (anxiousness).     apixaban  (ELIQUIS ) 5 MG TABS tablet Take 1 tablet (5 mg total) by mouth 2 (two) times daily. 180 tablet 3   Calcium  Carb-Cholecalciferol 600-3.125 MG-MCG TABS      Cholecalciferol (VITAMIN D ) 50 MCG (2000 UT) CAPS Take 2,000 Units by mouth at bedtime.     dexamethasone  (DECADRON ) 4 MG tablet Take 5 tabs (20 mg) weekly the day after daratumumab  for 12 weeks. Take with breakfast. 20 tablet 5   esomeprazole  (NEXIUM ) 40 MG capsule Take 1 capsule (40 mg total) by mouth daily before breakfast. 30 capsule 2   estradiol  (VIVELLE -DOT) 0.025 MG/24HR PLACE 1 PATCH ONTO THE SKIN 2 (TWO) TIMES A WEEK. SUNDAYS & WEDNESDAYS 8 patch 0   metoprolol  tartrate (LOPRESSOR ) 50 MG tablet TAKE ONE TABLET  BY MOUTH TWICE A DAY 180 tablet 3   olmesartan (BENICAR) 40 MG tablet Take 40 mg by mouth daily.     ondansetron  (ZOFRAN ) 8 MG tablet Take 1 tablet (8 mg total) by mouth every 8 (eight) hours as needed for nausea or vomiting. 30 tablet 1   Polyethyl Glycol-Propyl Glycol (SYSTANE OP) Place 1 drop into both eyes daily.     prochlorperazine  (COMPAZINE ) 10 MG tablet Take 1 tablet (10 mg total) by mouth every 6 (six) hours as needed for nausea or vomiting. 30 tablet 1   rosuvastatin  (CRESTOR ) 20 MG tablet Take 1 tablet (20 mg total) by mouth every evening. 90 tablet 3   No current facility-administered medications for this visit.    REVIEW OF SYSTEMS:    .10 Point review of Systems was done is negative except as noted above.  PHYSICAL EXAMINATION:  .BP (!) 161/72 Comment: re-check  Pulse 81   Temp (!) 97 F (36.1 C)   Resp 20   Wt 161 lb 6.4 oz (73.2 kg)   SpO2 98%   BMI 28.59 kg/m   GENERAL:alert, in no acute distress and comfortable SKIN: no acute rashes, no significant lesions EYES: conjunctiva are pink and non-injected, sclera anicteric OROPHARYNX: MMM, no exudates, no oropharyngeal erythema or ulceration NECK: supple, no JVD LYMPH:  no palpable lymphadenopathy in the cervical, axillary or inguinal regions LUNGS: clear to auscultation b/l with normal respiratory effort HEART: regular rate & rhythm ABDOMEN:  normoactive bowel sounds , non tender, not distended. Extremity: no pedal edema PSYCH: alert & oriented x 3 with fluent speech NEURO: no focal motor/sensory deficits   LABORATORY DATA:  I have reviewed the data as listed  .    Latest Ref Rng & Units 06/29/2024    9:39 AM 06/18/2024    3:58 PM 05/16/2024   12:50 PM  CBC  WBC 4.0 - 10.5 K/uL 5.1  5.0  4.9   Hemoglobin 12.0 - 15.0 g/dL 89.4  89.6  89.0   Hematocrit 36.0 - 46.0 % 32.0  31.4  32.8   Platelets 150 - 400 K/uL 203  264  245      .    Latest Ref Rng & Units 06/29/2024  11:14 AM 06/18/2024    3:58 PM  05/16/2024   12:50 PM  CMP  Glucose 70 - 99 mg/dL 886  887  883   BUN 8 - 23 mg/dL 24  18  22    Creatinine 0.44 - 1.00 mg/dL 9.17  9.28  9.16   Sodium 135 - 145 mmol/L 139  138  137   Potassium 3.5 - 5.1 mmol/L 3.9  4.3  4.1   Chloride 98 - 111 mmol/L 102  105  103   CO2 22 - 32 mmol/L 29  27  28    Calcium  8.9 - 10.3 mg/dL 9.6  9.9  89.7   Total Protein 6.5 - 8.1 g/dL 7.2  8.3  8.4   Total Bilirubin 0.0 - 1.2 mg/dL 0.5  0.4  0.4   Alkaline Phos 38 - 126 U/L 42  40  43   AST 15 - 41 U/L 13  19  17    ALT 0 - 44 U/L 13  15  13     IFE/PE, serum 07/21/2023:  Surgical Pathology  CASE: [phone]  PATIENT: Mary Potter  Bone Marrow Report      Clinical History: MYELOMA      DIAGNOSIS:   BONE MARROW, ASPIRATE, CLOT, CORE:  - Hypercellular bone marrow (60%) involved by plasma cell neoplasm (63%  plasma cells by manual aspirate differential, approximately 70% by CD138  immunohistochemical analysis, and kappa monotypic by kappa/lambda in  situ hybridization).  See comment.   PERIPHERAL BLOOD:  - Normocytic normochromic anemia   COMMENT:   Correlation with clinical findings, radiographic skeletal survey,  myeloma panel and other laboratory tests (SPEP, UPEP, beta  microglobulin, etc.) is recommended for a complete assessment and  classification of this case.     RADIOGRAPHIC STUDIES: I have personally reviewed the radiological images as listed and agreed with the findings in the report. No results found.   ASSESSMENT & PLAN:   78 y.o. female with:  Newly diagnosed IgA kappa active multiple myeloma. PET/CT 04/30/2024 nonspecific right humeral shaft uptake.  No definitive bone lesions. Bone marrow biopsy shows 70% kappa restricted plasma cells consistent with active myeloma based on the presence of more than 60% involvement with plasma cell neoplasm and also based on light chain criteria. - Molecular cytogenetics show 13 q. deletion suggesting standard risk  myeloma. Pretreatment serology show IgA level of 1886 and an M spike of 1 g/dL Kappa free light chains of 265 with a kappa lambda ratio of 106  2. history of A-fib and is on anticoagulation.  No previous history of MI or CVA or VTE.  3.  History of hiatal hernia  4.  Patient Active Problem List   Diagnosis Date Noted   Multiple myeloma not having achieved remission (HCC) 06/10/2024   Counseling regarding advance care planning and goals of care 06/10/2024   Coronary artery disease involving native coronary artery of native heart without angina pectoris 03/28/2023   Hypercoagulable state due to paroxysmal atrial fibrillation (HCC) 03/02/2023   Persistent atrial fibrillation (HCC) 03/02/2023   Endometrial adenocarcinoma (HCC) 03/20/2021   Complex endometrial hyperplasia with atypia 02/18/2021   PAF (paroxysmal atrial fibrillation) (HCC) 10/15/2020   Elevated coronary artery calcium  score 09/14/2020   Essential hypertension 09/14/2020   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 06/29/2018   Greater trochanteric bursitis of left hip 04/18/2018   Acute left lumbar radiculopathy 03/21/2018   Iliotibial band tendinitis of right side 09/30/2016   Trigger finger, right middle finger  09/30/2016   Thickened endometrium 01/03/2015   Endometrial hyperplasia 01/03/2015   Tibialis posterior tendonitis 05/09/2014   H/O ankle fusion 05/09/2014   Left leg pain 04/17/2014   Lipid disorder 11/14/2012   GERD 09/11/2010   CONSTIPATION 09/11/2010   DIARRHEA 09/11/2010   PLAN:  -Patient is here for toxicity check after cycle 1 day 1 of daratumumab  Faspro/Revlimid/dexamethasone  Labs done today 06/29/2024 were discussed in detail with the patient CBC shows hemoglobin of 10.5 with normal WBC count and platelets CMP is stable with normal creatinine of 0.82 and normal calcium  level of 9.6 Patient notes grade 1 insomnia, grade 1 fatigue but no other significant toxicities from her first cycle  of treatment. Notes some mild constipation. Patient appropriate to proceed with cycle 1 day 8 of Dara/Velcade /dexamethasone  treatment. She will cut down the dexamethasone  posttreatment by 4 mg every week starting this week. Will continue the same supportive premedications Continue acyclovir  for shingles prophylaxis   FOLLOW-UP: Please  schedule remaining cycle 1 and cycle 2 of treatment per integrated scheduling  The total time spent in the appointment was 30 minutes*.  All of the patient's questions were answered with apparent satisfaction. The patient knows to call the clinic with any problems, questions or concerns.   Emaline Saran MD MS AAHIVMS [facility] Hematology/Oncology Physician [facility]  .*Total Encounter Time as defined by the Centers for Medicare and Medicaid Services includes, in addition to the face-to-face time of a patient visit (documented in the note above) non-face-to-face time: obtaining and reviewing outside history, ordering and reviewing medications, tests or procedures, care coordination (communications with other health care professionals or caregivers) and documentation in the medical record.

## 2024-07-12 NOTE — Progress Notes (Unsigned)
 HEMATOLOGY/ONCOLOGY CLINIC VISIT NOTE  Date of Service: .07/13/2024   Patient Care Team: Larnell Hamilton, MD as PCP - General (Internal Medicine) Lavona Agent, MD as PCP - Cardiology (Cardiology) Cindie Ole DASEN, MD as PCP - Electrophysiology (Cardiology) Danielle Rom, MD as Consulting Physician (Obstetrics and Gynecology) Key, Hargis HERO, NP as Nurse Practitioner (Gynecology) Lavona Agent, MD as Consulting Physician (Cardiology) Onesimo Emaline Brink, MD as Consulting Physician (Hematology)  DIAGNOSIS: IgA Kappa Multiple myeloma  INTERVAL HISTORY:  ANNISSA Potter is a wonderful 78 y.o. female who is here for continued evaluation and management of her multiple myeloma. ***  MEDICAL HISTORY:  Past Medical History:  Diagnosis Date   Atrial fibrillation (HCC)    Dysrhythmia    A-fib   Endometrial hyperplasia 01/03/2015   GERD (gastroesophageal reflux disease)    History of kidney stones    Hyperlipidemia    Hypertension    Pneumonia    PONV (postoperative nausea and vomiting)    slow to wake up   Thickened endometrium 01/03/2015   Vaginal delivery 1976, 1985    SURGICAL HISTORY: Past Surgical History:  Procedure Laterality Date   ABDOMINAL HYSTERECTOMY     ANKLE FRACTURE SURGERY     ATRIAL FIBRILLATION ABLATION N/A 02/08/2022   Procedure: ATRIAL FIBRILLATION ABLATION;  Surgeon: Cindie Ole DASEN, MD;  [facility]: [facility];  Service: Cardiovascular;  Laterality: N/A;   CARDIOVERSION N/A 02/17/2023   Procedure: CARDIOVERSION;  Surgeon: Sheena Pugh, DO;  Location: [facility];  Service: Cardiovascular;  Laterality: N/A;   CATARACT EXTRACTION Right 2020   DILATION AND CURETTAGE OF UTERUS  2012   ablation    EYE SURGERY     Corneal growth removal on right eye   HYSTEROSCOPY WITH D & C N/A 01/03/2015   Procedure: DILATATION AND CURETTAGE /HYSTEROSCOPY;  Surgeon: Robbi Render, MD;  Location: [facility];  Service: Gynecology;  Laterality:  N/A;   LAPAROSCOPY     for endometriosis   ROBOTIC ASSISTED TOTAL HYSTERECTOMY WITH BILATERAL SALPINGO OOPHERECTOMY N/A 03/17/2021   Procedure: XI ROBOTIC ASSISTED TOTAL HYSTERECTOMY WITH BILATERAL SALPINGO OOPHORECTOMY;  Surgeon: Viktoria Comer SAUNDERS, MD;  Location: [facility];  Service: Gynecology;  Laterality: N/A;   SENTINEL NODE BIOPSY N/A 03/17/2021   Procedure: SENTINEL NODE BIOPSY;  Surgeon: Viktoria Comer SAUNDERS, MD;  Location: [facility];  Service: Gynecology;  Laterality: N/A;   TONSILLECTOMY AND ADENOIDECTOMY      SOCIAL HISTORY: Social History   Socioeconomic History   Marital status: Widowed    Spouse name: Mary Potter   Number of children: 2   Years of education: 14   Highest education level: Not on file  Occupational History   Occupation: retired    Comment: [employer]  Tobacco Use   Smoking status: Never    Passive exposure: Past   Smokeless tobacco: Never   Tobacco comments:    Never smoke 03/02/23  Vaping Use   Vaping status: Never Used  Substance and Sexual Activity   Alcohol  use: No    Alcohol /week: 0.0 standard drinks of alcohol    Drug use: No   Sexual activity: Yes    Birth control/protection: Other-see comments    Comment: Hysterectomy  Other Topics Concern   Not on file  Social History Narrative   Lives with her younger daughter, Mary Potter. Her older daughter lives in [place] with her family.   Social Drivers of Corporate investment banker Strain: Not on file  Food Insecurity: No Food  Insecurity (06/19/2024)   Hunger Vital Sign    Worried About Running Out of Food in the Last Year: Never true    Ran Out of Food in the Last Year: Never true  Transportation Needs: No Transportation Needs (06/19/2024)   PRAPARE - Administrator, Civil Service (Medical): No    Lack of Transportation (Non-Medical): No  Physical Activity: Not on file  Stress: Not on file  Social Connections: Not on file  Intimate Partner Violence: Not At Risk (06/19/2024)   Humiliation,  Afraid, Rape, and Kick questionnaire    Fear of Current or Ex-Partner: No    Emotionally Abused: No    Physically Abused: No    Sexually Abused: No    FAMILY HISTORY: Family History  Problem Relation Age of Onset   Heart disease Mother 24       CABG   Stroke Father    Prostate cancer Father    Heart disease Brother 40       Transplant, died 10 years   Heart disease Maternal Grandmother    Heart disease Paternal Grandmother    Cancer Paternal Grandfather    Heart disease Brother 37       RF   Leukemia Brother    Leukemia Brother    Hypertension Sister    Cervical cancer Sister    Colon cancer Neg Hx    Breast cancer Neg Hx    Ovarian cancer Neg Hx    Endometrial cancer Neg Hx    Pancreatic cancer Neg Hx     ALLERGIES:  is allergic to talwin [pentazocine], benadryl  [diphenhydramine ], hydrocodone, and prednisone .  MEDICATIONS:  Current Outpatient Medications  Medication Sig Dispense Refill   acetaminophen  (TYLENOL ) 500 MG tablet Take 1,000 mg by mouth every 6 (six) hours as needed (Arthritis Pain).     acyclovir  (ZOVIRAX ) 400 MG tablet Take 1 tablet (400 mg total) by mouth 2 (two) times daily. 60 tablet 5   ALPRAZolam (XANAX) 0.5 MG tablet Take 0.25 mg by mouth daily as needed (anxiousness).     apixaban  (ELIQUIS ) 5 MG TABS tablet Take 1 tablet (5 mg total) by mouth 2 (two) times daily. 180 tablet 3   Calcium  Carb-Cholecalciferol 600-3.125 MG-MCG TABS      Cholecalciferol (VITAMIN D ) 50 MCG (2000 UT) CAPS Take 2,000 Units by mouth at bedtime.     dexamethasone  (DECADRON ) 4 MG tablet Take 5 tabs (20 mg) weekly the day after daratumumab  for 12 weeks. Take with breakfast. 20 tablet 5   esomeprazole  (NEXIUM ) 40 MG capsule Take 1 capsule (40 mg total) by mouth daily before breakfast. 30 capsule 2   estradiol  (VIVELLE -DOT) 0.025 MG/24HR PLACE 1 PATCH ONTO THE SKIN 2 (TWO) TIMES A WEEK. SUNDAYS & WEDNESDAYS 8 patch 0   metoprolol  tartrate (LOPRESSOR ) 50 MG tablet TAKE ONE TABLET  BY MOUTH TWICE A DAY 180 tablet 3   olmesartan (BENICAR) 40 MG tablet Take 40 mg by mouth daily.     ondansetron  (ZOFRAN ) 8 MG tablet Take 1 tablet (8 mg total) by mouth every 8 (eight) hours as needed for nausea or vomiting. 30 tablet 1   Polyethyl Glycol-Propyl Glycol (SYSTANE OP) Place 1 drop into both eyes daily.     prochlorperazine  (COMPAZINE ) 10 MG tablet Take 1 tablet (10 mg total) by mouth every 6 (six) hours as needed for nausea or vomiting. 30 tablet 1   rosuvastatin  (CRESTOR ) 20 MG tablet Take 1 tablet (20 mg total) by mouth every evening.  90 tablet 3   No current facility-administered medications for this visit.    REVIEW OF SYSTEMS:    .10 Point review of Systems was done is negative except as noted above.  PHYSICAL EXAMINATION:  .There were no vitals taken for this visit.  GENERAL:alert, in no acute distress and comfortable SKIN: no acute rashes, no significant lesions EYES: conjunctiva are pink and non-injected, sclera anicteric OROPHARYNX: MMM, no exudates, no oropharyngeal erythema or ulceration NECK: supple, no JVD LYMPH:  no palpable lymphadenopathy in the cervical, axillary or inguinal regions LUNGS: clear to auscultation b/l with normal respiratory effort HEART: regular rate & rhythm ABDOMEN:  normoactive bowel sounds , non tender, not distended. Extremity: no pedal edema PSYCH: alert & oriented x 3 with fluent speech NEURO: no focal motor/sensory deficits   LABORATORY DATA:  I have reviewed the data as listed  .    Latest Ref Rng & Units 07/05/2024    1:42 PM 06/29/2024    9:39 AM 06/18/2024    3:58 PM  CBC  WBC 4.0 - 10.5 K/uL 7.1  5.1  5.0   Hemoglobin 12.0 - 15.0 g/dL 89.8  89.4  89.6   Hematocrit 36.0 - 46.0 % 30.6  32.0  31.4   Platelets 150 - 400 K/uL 182  203  264      .    Latest Ref Rng & Units 07/05/2024    1:42 PM 06/29/2024   11:14 AM 06/18/2024    3:58 PM  CMP  Glucose 70 - 99 mg/dL 99  886  887   BUN 8 - 23 mg/dL 26  24  18     Creatinine 0.44 - 1.00 mg/dL 9.24  9.17  9.28   Sodium 135 - 145 mmol/L 139  139  138   Potassium 3.5 - 5.1 mmol/L 4.5  3.9  4.3   Chloride 98 - 111 mmol/L 105  102  105   CO2 22 - 32 mmol/L 29  29  27    Calcium  8.9 - 10.3 mg/dL 9.0  9.6  9.9   Total Protein 6.5 - 8.1 g/dL 6.9  7.2  8.3   Total Bilirubin 0.0 - 1.2 mg/dL 0.4  0.5  0.4   Alkaline Phos 38 - 126 U/L 42  42  40   AST 15 - 41 U/L 12  13  19    ALT 0 - 44 U/L 11  13  15     IFE/PE, serum 07/21/2023:  Surgical Pathology  CASE: [phone]  PATIENT: Adelynne Peugh  Bone Marrow Report      Clinical History: MYELOMA      DIAGNOSIS:   BONE MARROW, ASPIRATE, CLOT, CORE:  - Hypercellular bone marrow (60%) involved by plasma cell neoplasm (63%  plasma cells by manual aspirate differential, approximately 70% by CD138  immunohistochemical analysis, and kappa monotypic by kappa/lambda in  situ hybridization).  See comment.   PERIPHERAL BLOOD:  - Normocytic normochromic anemia   COMMENT:   Correlation with clinical findings, radiographic skeletal survey,  myeloma panel and other laboratory tests (SPEP, UPEP, beta  microglobulin, etc.) is recommended for a complete assessment and  classification of this case.     RADIOGRAPHIC STUDIES: I have personally reviewed the radiological images as listed and agreed with the findings in the report. No results found.   ASSESSMENT & PLAN:  Mary Potter is a 78 y.o. female who presents for continued management of multiple myeloma.   # IgA kappa multiple myeloma. --PET/CT 04/30/2024 nonspecific  right humeral shaft uptake.  No definitive bone lesions. --Bone marrow biopsy shows 70% kappa restricted plasma cells consistent with active myeloma based on the presence of more than 60% involvement with plasma cell neoplasm and also based on light chain criteria. --Molecular cytogenetics show 13 q. deletion suggesting standard risk myeloma. --Pretreatment serology show IgA  level of 1886 and an M spike of 1 g/dL, Kappa free light chains of 265 with a kappa lambda ratio of 106 --Started Dara/Velcade /Dex on 06/22/2024   PLAN: -Due for cycle 2, day 1 of Dara/velcade /dex today. -Labs from today were reviewed. *** -Proceed with Xgeva  today *** -Continue to decrease dexamethasone  posttreatment by 4 mg every week..   FOLLOW-UP:  All of the patient's questions were answered with apparent satisfaction. The patient knows to call the clinic with any problems, questions or concerns.   I have spent a total of 30 minutes minutes of face-to-face and non-face-to-face time, preparing to see the patient,  performing a medically appropriate examination, counseling and educating the patient, ordering medications/tests/procedures, documenting clinical information in the electronic health record, independently interpreting results and communicating results to the patient, and care coordination.   Johnston Police PA-C Dept of Hematology and Oncology [facility] at [facility] Phone: [phone]

## 2024-07-13 ENCOUNTER — Inpatient Hospital Stay

## 2024-07-13 ENCOUNTER — Ambulatory Visit: Admitting: Hematology

## 2024-07-13 ENCOUNTER — Inpatient Hospital Stay (HOSPITAL_BASED_OUTPATIENT_CLINIC_OR_DEPARTMENT_OTHER): Admitting: Physician Assistant

## 2024-07-13 ENCOUNTER — Other Ambulatory Visit

## 2024-07-13 VITALS — BP 135/59 | HR 85 | Temp 97.7°F | Resp 16 | Ht 63.0 in | Wt 163.0 lb

## 2024-07-13 DIAGNOSIS — C9000 Multiple myeloma not having achieved remission: Secondary | ICD-10-CM

## 2024-07-13 DIAGNOSIS — Z7189 Other specified counseling: Secondary | ICD-10-CM

## 2024-07-13 DIAGNOSIS — Z79899 Other long term (current) drug therapy: Secondary | ICD-10-CM | POA: Diagnosis not present

## 2024-07-13 DIAGNOSIS — Z7952 Long term (current) use of systemic steroids: Secondary | ICD-10-CM | POA: Diagnosis not present

## 2024-07-13 DIAGNOSIS — Z5111 Encounter for antineoplastic chemotherapy: Secondary | ICD-10-CM | POA: Diagnosis not present

## 2024-07-13 LAB — CBC WITH DIFFERENTIAL (CANCER CENTER ONLY)
Abs Immature Granulocytes: 0.02 K/uL (ref 0.00–0.07)
Basophils Absolute: 0 K/uL (ref 0.0–0.1)
Basophils Relative: 0 %
Eosinophils Absolute: 0.2 K/uL (ref 0.0–0.5)
Eosinophils Relative: 2 %
HCT: 31.8 % — ABNORMAL LOW (ref 36.0–46.0)
Hemoglobin: 10.3 g/dL — ABNORMAL LOW (ref 12.0–15.0)
Immature Granulocytes: 0 %
Lymphocytes Relative: 17 %
Lymphs Abs: 1.2 K/uL (ref 0.7–4.0)
MCH: 30.6 pg (ref 26.0–34.0)
MCHC: 32.4 g/dL (ref 30.0–36.0)
MCV: 94.4 fL (ref 80.0–100.0)
Monocytes Absolute: 0.4 K/uL (ref 0.1–1.0)
Monocytes Relative: 6 %
Neutro Abs: 5.3 K/uL (ref 1.7–7.7)
Neutrophils Relative %: 75 %
Platelet Count: 189 K/uL (ref 150–400)
RBC: 3.37 MIL/uL — ABNORMAL LOW (ref 3.87–5.11)
RDW: 15 % (ref 11.5–15.5)
WBC Count: 7.1 K/uL (ref 4.0–10.5)
nRBC: 0 % (ref 0.0–0.2)

## 2024-07-13 LAB — CMP (CANCER CENTER ONLY)
ALT: 13 U/L (ref 0–44)
AST: 13 U/L — ABNORMAL LOW (ref 15–41)
Albumin: 4.1 g/dL (ref 3.5–5.0)
Alkaline Phosphatase: 47 U/L (ref 38–126)
Anion gap: 7 (ref 5–15)
BUN: 19 mg/dL (ref 8–23)
CO2: 27 mmol/L (ref 22–32)
Calcium: 9.2 mg/dL (ref 8.9–10.3)
Chloride: 107 mmol/L (ref 98–111)
Creatinine: 0.8 mg/dL (ref 0.44–1.00)
GFR, Estimated: >60 mL/min (ref >60)
Glucose, Bld: 126 mg/dL — ABNORMAL HIGH (ref 70–99)
Potassium: 4 mmol/L (ref 3.5–5.1)
Sodium: 141 mmol/L (ref 135–145)
Total Bilirubin: 0.5 mg/dL (ref 0.0–1.2)
Total Protein: 6.7 g/dL (ref 6.5–8.1)

## 2024-07-13 MED ORDER — DENOSUMAB 120 MG/1.7ML ~~LOC~~ SOLN
120.0000 mg | Freq: Once | SUBCUTANEOUS | Status: AC
  Administered 2024-07-13: 120 mg via SUBCUTANEOUS
  Filled 2024-07-13: qty 1.7

## 2024-07-13 MED ORDER — FAMOTIDINE 20 MG PO TABS
20.0000 mg | ORAL_TABLET | Freq: Once | ORAL | Status: AC
  Administered 2024-07-13: 20 mg via ORAL
  Filled 2024-07-13: qty 1

## 2024-07-13 MED ORDER — DEXAMETHASONE 4 MG PO TABS
20.0000 mg | ORAL_TABLET | Freq: Once | ORAL | Status: AC
  Administered 2024-07-13: 20 mg via ORAL
  Filled 2024-07-13: qty 5

## 2024-07-13 MED ORDER — ACETAMINOPHEN 325 MG PO TABS
650.0000 mg | ORAL_TABLET | Freq: Once | ORAL | Status: AC
  Administered 2024-07-13: 650 mg via ORAL
  Filled 2024-07-13: qty 2

## 2024-07-13 MED ORDER — DARATUMUMAB-HYALURONIDASE-FIHJ 1800-30000 MG-UT/15ML ~~LOC~~ SOLN
1800.0000 mg | Freq: Once | SUBCUTANEOUS | Status: AC
  Administered 2024-07-13: 1800 mg via SUBCUTANEOUS
  Filled 2024-07-13: qty 15

## 2024-07-13 MED ORDER — BORTEZOMIB CHEMO SQ INJECTION 3.5 MG (2.5MG/ML)
1.3000 mg/m2 | Freq: Once | INTRAMUSCULAR | Status: AC
  Administered 2024-07-13: 2.25 mg via SUBCUTANEOUS
  Filled 2024-07-13: qty 0.9

## 2024-07-13 MED ORDER — DIPHENHYDRAMINE HCL 25 MG PO CAPS
50.0000 mg | ORAL_CAPSULE | Freq: Once | ORAL | Status: AC
  Administered 2024-07-13: 50 mg via ORAL
  Filled 2024-07-13: qty 2

## 2024-07-13 NOTE — Patient Instructions (Signed)
 [facility] A DEPT OF [place]  Discharge Instructions: Thank you for choosing [facility] to provide your oncology and hematology care.   If you have a lab appointment with the Cancer Center, please go directly to the Cancer Center and check in at the registration area.   Wear comfortable clothing and clothing appropriate for easy access to any Portacath or PICC line.   We strive to give you quality time with your provider. You may need to reschedule your appointment if you arrive late (15 or more minutes).  Arriving late affects you and other patients whose appointments are after yours.  Also, if you miss three or more appointments without notifying the office, you may be dismissed from the clinic at the provider's discretion.      For prescription refill requests, have your pharmacy contact our office and allow 72 hours for refills to be completed.    Today you received the following chemotherapy and/or immunotherapy agents darzalex  faspro and velcade , xgeva       To help prevent nausea and vomiting after your treatment, we encourage you to take your nausea medication as directed.  BELOW ARE SYMPTOMS THAT SHOULD BE REPORTED IMMEDIATELY: *FEVER GREATER THAN 100.4 F (38 C) OR HIGHER *CHILLS OR SWEATING *NAUSEA AND VOMITING THAT IS NOT CONTROLLED WITH YOUR NAUSEA MEDICATION *UNUSUAL SHORTNESS OF BREATH *UNUSUAL BRUISING OR BLEEDING *URINARY PROBLEMS (pain or burning when urinating, or frequent urination) *BOWEL PROBLEMS (unusual diarrhea, constipation, pain near the anus) TENDERNESS IN MOUTH AND THROAT WITH OR WITHOUT PRESENCE OF ULCERS (sore throat, sores in mouth, or a toothache) UNUSUAL RASH, SWELLING OR PAIN  UNUSUAL VAGINAL DISCHARGE OR ITCHING   Items with * indicate a potential emergency and should be followed up as soon as possible or go to the Emergency Department if any problems should occur.  Please show the CHEMOTHERAPY  ALERT CARD or IMMUNOTHERAPY ALERT CARD at check-in to the Emergency Department and triage nurse.  Should you have questions after your visit or need to cancel or reschedule your appointment, please contact [facility]  Dept: [phone]  and follow the prompts.  Office hours are 8:00 a.m. to 4:30 p.m. Monday - Friday. Please note that voicemails left after 4:00 p.m. may not be returned until the following business day.  We are closed weekends and major holidays. You have access to a nurse at all times for urgent questions. Please call the main number to the clinic Dept: [phone] and follow the prompts.   For any non-urgent questions, you may also contact your provider using MyChart. We now offer e-Visits for anyone 62 and older to request care online for non-urgent symptoms. For details visit [email or web address].   Also download the MyChart app! Go to the app store, search MyChart, open the app, select [place], and log in with your MyChart username and password.

## 2024-07-16 LAB — KAPPA/LAMBDA LIGHT CHAINS
Kappa free light chain: 6.9 mg/L (ref 3.3–19.4)
Kappa, lambda light chain ratio: 3.14 — ABNORMAL HIGH (ref 0.26–1.65)
Lambda free light chains: 2.2 mg/L — ABNORMAL LOW (ref 5.7–26.3)

## 2024-07-18 LAB — MULTIPLE MYELOMA PANEL, SERUM
Albumin SerPl Elph-Mcnc: 3.3 g/dL (ref 2.9–4.4)
Albumin/Glob SerPl: 1.2 (ref 0.7–1.7)
Alpha 1: 0.2 g/dL (ref 0.0–0.4)
Alpha2 Glob SerPl Elph-Mcnc: 1 g/dL (ref 0.4–1.0)
B-Globulin SerPl Elph-Mcnc: 1.1 g/dL (ref 0.7–1.3)
Gamma Glob SerPl Elph-Mcnc: 0.7 g/dL (ref 0.4–1.8)
Globulin, Total: 3 g/dL (ref 2.2–3.9)
IgA: 314 mg/dL (ref 64–422)
IgG (Immunoglobin G), Serum: 518 mg/dL — ABNORMAL LOW (ref 586–1602)
IgM (Immunoglobulin M), Srm: 27 mg/dL (ref 26–217)
M Protein SerPl Elph-Mcnc: 0.2 g/dL — ABNORMAL HIGH
Total Protein ELP: 6.3 g/dL (ref 6.0–8.5)

## 2024-07-20 ENCOUNTER — Inpatient Hospital Stay

## 2024-07-20 VITALS — BP 160/81 | HR 72 | Temp 98.0°F | Resp 16 | Wt 166.0 lb

## 2024-07-20 DIAGNOSIS — C9000 Multiple myeloma not having achieved remission: Secondary | ICD-10-CM

## 2024-07-20 DIAGNOSIS — Z7952 Long term (current) use of systemic steroids: Secondary | ICD-10-CM | POA: Diagnosis not present

## 2024-07-20 DIAGNOSIS — Z7189 Other specified counseling: Secondary | ICD-10-CM

## 2024-07-20 DIAGNOSIS — Z79899 Other long term (current) drug therapy: Secondary | ICD-10-CM | POA: Diagnosis not present

## 2024-07-20 DIAGNOSIS — Z5111 Encounter for antineoplastic chemotherapy: Secondary | ICD-10-CM | POA: Diagnosis not present

## 2024-07-20 LAB — CBC WITH DIFFERENTIAL (CANCER CENTER ONLY)
Abs Immature Granulocytes: 0.02 K/uL (ref 0.00–0.07)
Basophils Absolute: 0 K/uL (ref 0.0–0.1)
Basophils Relative: 0 %
Eosinophils Absolute: 0.3 K/uL (ref 0.0–0.5)
Eosinophils Relative: 3 %
HCT: 29.5 % — ABNORMAL LOW (ref 36.0–46.0)
Hemoglobin: 9.5 g/dL — ABNORMAL LOW (ref 12.0–15.0)
Immature Granulocytes: 0 %
Lymphocytes Relative: 10 %
Lymphs Abs: 0.8 K/uL (ref 0.7–4.0)
MCH: 30.5 pg (ref 26.0–34.0)
MCHC: 32.2 g/dL (ref 30.0–36.0)
MCV: 94.9 fL (ref 80.0–100.0)
Monocytes Absolute: 0.4 K/uL (ref 0.1–1.0)
Monocytes Relative: 5 %
Neutro Abs: 6.5 K/uL (ref 1.7–7.7)
Neutrophils Relative %: 82 %
Platelet Count: 176 K/uL (ref 150–400)
RBC: 3.11 MIL/uL — ABNORMAL LOW (ref 3.87–5.11)
RDW: 15.2 % (ref 11.5–15.5)
WBC Count: 8.1 K/uL (ref 4.0–10.5)
nRBC: 0 % (ref 0.0–0.2)

## 2024-07-20 LAB — CMP (CANCER CENTER ONLY)
ALT: 12 U/L (ref 0–44)
AST: 14 U/L — ABNORMAL LOW (ref 15–41)
Albumin: 4 g/dL (ref 3.5–5.0)
Alkaline Phosphatase: 49 U/L (ref 38–126)
Anion gap: 6 (ref 5–15)
BUN: 18 mg/dL (ref 8–23)
CO2: 29 mmol/L (ref 22–32)
Calcium: 8.8 mg/dL — ABNORMAL LOW (ref 8.9–10.3)
Chloride: 105 mmol/L (ref 98–111)
Creatinine: 0.84 mg/dL (ref 0.44–1.00)
GFR, Estimated: >60 mL/min (ref >60)
Glucose, Bld: 99 mg/dL (ref 70–99)
Potassium: 3.9 mmol/L (ref 3.5–5.1)
Sodium: 140 mmol/L (ref 135–145)
Total Bilirubin: 0.5 mg/dL (ref 0.0–1.2)
Total Protein: 6.4 g/dL — ABNORMAL LOW (ref 6.5–8.1)

## 2024-07-20 MED ORDER — DEXAMETHASONE 6 MG PO TABS
20.0000 mg | ORAL_TABLET | Freq: Once | ORAL | Status: AC
  Administered 2024-07-20: 20 mg via ORAL
  Filled 2024-07-20: qty 2

## 2024-07-20 MED ORDER — FAMOTIDINE 20 MG PO TABS
20.0000 mg | ORAL_TABLET | Freq: Once | ORAL | Status: AC
  Administered 2024-07-20: 20 mg via ORAL
  Filled 2024-07-20: qty 1

## 2024-07-20 MED ORDER — DIPHENHYDRAMINE HCL 25 MG PO CAPS
50.0000 mg | ORAL_CAPSULE | Freq: Once | ORAL | Status: AC
  Administered 2024-07-20: 50 mg via ORAL
  Filled 2024-07-20: qty 2

## 2024-07-20 MED ORDER — DARATUMUMAB-HYALURONIDASE-FIHJ 1800-30000 MG-UT/15ML ~~LOC~~ SOLN
1800.0000 mg | Freq: Once | SUBCUTANEOUS | Status: AC
  Administered 2024-07-20: 1800 mg via SUBCUTANEOUS
  Filled 2024-07-20: qty 15

## 2024-07-20 MED ORDER — BORTEZOMIB CHEMO SQ INJECTION 3.5 MG (2.5MG/ML)
1.3000 mg/m2 | Freq: Once | INTRAMUSCULAR | Status: AC
  Administered 2024-07-20: 2.25 mg via SUBCUTANEOUS
  Filled 2024-07-20: qty 0.9

## 2024-07-20 MED ORDER — ACETAMINOPHEN 325 MG PO TABS
650.0000 mg | ORAL_TABLET | Freq: Once | ORAL | Status: AC
  Administered 2024-07-20: 650 mg via ORAL
  Filled 2024-07-20: qty 2

## 2024-07-20 NOTE — Patient Instructions (Signed)
 [facility] - A DEPT OF [facility]  Discharge Instructions: Thank you for choosing [facility] to provide your oncology and hematology care.   If you have a lab appointment with the Cancer Center, please go directly to the Cancer Center and check in at the registration area.   Wear comfortable clothing and clothing appropriate for easy access to any Portacath or PICC line.   We strive to give you quality time with your provider. You may need to reschedule your appointment if you arrive late (15 or more minutes).  Arriving late affects you and other patients whose appointments are after yours.  Also, if you miss three or more appointments without notifying the office, you may be dismissed from the clinic at the provider's discretion.      For prescription refill requests, have your pharmacy contact our office and allow 72 hours for refills to be completed.    Today you received the following chemotherapy and/or immunotherapy agents darzalex  faspro and velcade       To help prevent nausea and vomiting after your treatment, we encourage you to take your nausea medication as directed.  BELOW ARE SYMPTOMS THAT SHOULD BE REPORTED IMMEDIATELY: *FEVER GREATER THAN 100.4 F (38 C) OR HIGHER *CHILLS OR SWEATING *NAUSEA AND VOMITING THAT IS NOT CONTROLLED WITH YOUR NAUSEA MEDICATION *UNUSUAL SHORTNESS OF BREATH *UNUSUAL BRUISING OR BLEEDING *URINARY PROBLEMS (pain or burning when urinating, or frequent urination) *BOWEL PROBLEMS (unusual diarrhea, constipation, pain near the anus) TENDERNESS IN MOUTH AND THROAT WITH OR WITHOUT PRESENCE OF ULCERS (sore throat, sores in mouth, or a toothache) UNUSUAL RASH, SWELLING OR PAIN  UNUSUAL VAGINAL DISCHARGE OR ITCHING   Items with * indicate a potential emergency and should be followed up as soon as possible or go to the Emergency Department if any problems should occur.  Please show the CHEMOTHERAPY ALERT CARD  or IMMUNOTHERAPY ALERT CARD at check-in to the Emergency Department and triage nurse.  Should you have questions after your visit or need to cancel or reschedule your appointment, please contact [facility]  Dept: [phone]  and follow the prompts.  Office hours are 8:00 a.m. to 4:30 p.m. Monday - Friday. Please note that voicemails left after 4:00 p.m. may not be returned until the following business day.  We are closed weekends and major holidays. You have access to a nurse at all times for urgent questions. Please call the main number to the clinic Dept: [phone] and follow the prompts.   For any non-urgent questions, you may also contact your provider using MyChart. We now offer e-Visits for anyone 43 and older to request care online for non-urgent symptoms. For details visit [email or web address].   Also download the MyChart app! Go to the app store, search MyChart, open the app, select [facility], and log in with your MyChart username and password.

## 2024-07-20 NOTE — Progress Notes (Signed)
 Pt complained of watery eyes and nose after receiving darzalex  faspro and xgeva  last week. Johnston PA was notified and Izetta Hum [facility]. Katie reported this was likely from darzalex  faspro and stated she would get back to the treatment team Monday on possible ways to relieve this. Pt was educated and verbalized understanding.

## 2024-07-23 ENCOUNTER — Encounter: Payer: Self-pay | Admitting: Hematology

## 2024-07-25 ENCOUNTER — Encounter: Payer: Self-pay | Admitting: Hematology

## 2024-07-25 ENCOUNTER — Other Ambulatory Visit: Payer: Self-pay

## 2024-07-25 NOTE — Progress Notes (Signed)
 PGY2 Oncology Pharmacy Resident Intervention   Intervention Patient reported per Johnston Risen, PA that after their last darzalex  faspro shot they experienced runny eyes with a runny nose that didn't stop all day  An off-target effect of this medication is a infusion-related reaction due to the CD38 receptor also being present in the lungs. This can cause reactions similar to allergic rhinitis.  Montelukast  was just discontinued per protocol after the first cycle since highest risk for this reaction is generally during the first few doses.  Added back montelukast  to this patient's pre-medications  Treatment plan reviewed by Leotis Ferries, PharmD  Thank you for allowing pharmacy to participate in this patient's care.  Alfonso MARLA Buys, PharmD Pharmacy Resident  07/25/2024 4:38 PM

## 2024-07-27 ENCOUNTER — Other Ambulatory Visit: Payer: Self-pay

## 2024-07-27 ENCOUNTER — Inpatient Hospital Stay

## 2024-07-27 VITALS — BP 163/78 | HR 65 | Temp 98.7°F | Resp 19 | Wt 167.0 lb

## 2024-07-27 DIAGNOSIS — Z5111 Encounter for antineoplastic chemotherapy: Secondary | ICD-10-CM | POA: Diagnosis not present

## 2024-07-27 DIAGNOSIS — C9000 Multiple myeloma not having achieved remission: Secondary | ICD-10-CM

## 2024-07-27 DIAGNOSIS — Z7189 Other specified counseling: Secondary | ICD-10-CM

## 2024-07-27 DIAGNOSIS — Z79899 Other long term (current) drug therapy: Secondary | ICD-10-CM | POA: Diagnosis not present

## 2024-07-27 DIAGNOSIS — Z7952 Long term (current) use of systemic steroids: Secondary | ICD-10-CM | POA: Diagnosis not present

## 2024-07-27 LAB — CMP (CANCER CENTER ONLY)
ALT: 13 U/L (ref 0–44)
AST: 12 U/L — ABNORMAL LOW (ref 15–41)
Albumin: 4.1 g/dL (ref 3.5–5.0)
Alkaline Phosphatase: 46 U/L (ref 38–126)
Anion gap: 5 (ref 5–15)
BUN: 18 mg/dL (ref 8–23)
CO2: 31 mmol/L (ref 22–32)
Calcium: 9.2 mg/dL (ref 8.9–10.3)
Chloride: 105 mmol/L (ref 98–111)
Creatinine: 0.88 mg/dL (ref 0.44–1.00)
GFR, Estimated: >60 mL/min (ref >60)
Glucose, Bld: 108 mg/dL — ABNORMAL HIGH (ref 70–99)
Potassium: 4.3 mmol/L (ref 3.5–5.1)
Sodium: 141 mmol/L (ref 135–145)
Total Bilirubin: 0.5 mg/dL (ref 0.0–1.2)
Total Protein: 6.5 g/dL (ref 6.5–8.1)

## 2024-07-27 LAB — CBC WITH DIFFERENTIAL (CANCER CENTER ONLY)
Abs Immature Granulocytes: 0.02 K/uL (ref 0.00–0.07)
Basophils Absolute: 0 K/uL (ref 0.0–0.1)
Basophils Relative: 0 %
Eosinophils Absolute: 0.2 K/uL (ref 0.0–0.5)
Eosinophils Relative: 4 %
HCT: 31 % — ABNORMAL LOW (ref 36.0–46.0)
Hemoglobin: 10.1 g/dL — ABNORMAL LOW (ref 12.0–15.0)
Immature Granulocytes: 0 %
Lymphocytes Relative: 14 %
Lymphs Abs: 0.9 K/uL (ref 0.7–4.0)
MCH: 30.5 pg (ref 26.0–34.0)
MCHC: 32.6 g/dL (ref 30.0–36.0)
MCV: 93.7 fL (ref 80.0–100.0)
Monocytes Absolute: 0.4 K/uL (ref 0.1–1.0)
Monocytes Relative: 6 %
Neutro Abs: 4.9 K/uL (ref 1.7–7.7)
Neutrophils Relative %: 76 %
Platelet Count: 183 K/uL (ref 150–400)
RBC: 3.31 MIL/uL — ABNORMAL LOW (ref 3.87–5.11)
RDW: 15.2 % (ref 11.5–15.5)
WBC Count: 6.5 K/uL (ref 4.0–10.5)
nRBC: 0 % (ref 0.0–0.2)

## 2024-07-27 MED ORDER — DARATUMUMAB-HYALURONIDASE-FIHJ 1800-30000 MG-UT/15ML ~~LOC~~ SOLN
1800.0000 mg | Freq: Once | SUBCUTANEOUS | Status: AC
  Administered 2024-07-27: 1800 mg via SUBCUTANEOUS
  Filled 2024-07-27: qty 15

## 2024-07-27 MED ORDER — DEXAMETHASONE 4 MG PO TABS: ORAL_TABLET | ORAL | 5 refills | Status: AC

## 2024-07-27 MED ORDER — BORTEZOMIB CHEMO SQ INJECTION 3.5 MG (2.5MG/ML)
1.3000 mg/m2 | Freq: Once | INTRAMUSCULAR | Status: AC
  Administered 2024-07-27: 2.25 mg via SUBCUTANEOUS
  Filled 2024-07-27: qty 0.9

## 2024-07-27 MED ORDER — DIPHENHYDRAMINE HCL 25 MG PO CAPS
50.0000 mg | ORAL_CAPSULE | Freq: Once | ORAL | Status: AC
  Administered 2024-07-27: 50 mg via ORAL
  Filled 2024-07-27: qty 2

## 2024-07-27 MED ORDER — ACETAMINOPHEN 325 MG PO TABS
650.0000 mg | ORAL_TABLET | Freq: Once | ORAL | Status: AC
  Administered 2024-07-27: 650 mg via ORAL
  Filled 2024-07-27: qty 2

## 2024-07-27 MED ORDER — FAMOTIDINE 20 MG PO TABS
20.0000 mg | ORAL_TABLET | Freq: Once | ORAL | Status: AC
  Administered 2024-07-27: 20 mg via ORAL
  Filled 2024-07-27: qty 1

## 2024-07-27 MED ORDER — DEXAMETHASONE 6 MG PO TABS
20.0000 mg | ORAL_TABLET | Freq: Once | ORAL | Status: AC
  Administered 2024-07-27: 20 mg via ORAL
  Filled 2024-07-27: qty 2

## 2024-07-27 MED ORDER — MONTELUKAST SODIUM 10 MG PO TABS
10.0000 mg | ORAL_TABLET | Freq: Once | ORAL | Status: AC
  Administered 2024-07-27: 10 mg via ORAL
  Filled 2024-07-27: qty 1

## 2024-08-02 ENCOUNTER — Telehealth (HOSPITAL_COMMUNITY): Payer: Self-pay | Admitting: Pharmacy Technician

## 2024-08-02 ENCOUNTER — Other Ambulatory Visit (HOSPITAL_COMMUNITY): Payer: Self-pay | Admitting: Pharmacy Technician

## 2024-08-02 DIAGNOSIS — M810 Age-related osteoporosis without current pathological fracture: Secondary | ICD-10-CM | POA: Insufficient documentation

## 2024-08-02 NOTE — Telephone Encounter (Signed)
 Auth Submission: NO AUTH NEEDED Site of care: [facility] Payer: Medicare A/B, [employer] Medication & CPT/J Code(s) submitted: Prolia  (Denosumab ) [id number] Diagnosis Code: M81.0 Route of submission (phone, fax, portal):  Phone # Fax # Auth type: Buy/Bill HB Units/visits requested: 60mg  x 2 doses, q 6 months Reference number:  Approval from: 08/02/24 to 12/01/24    Dagoberto Armour, CPhT [facility] Phone: [phone] 08/02/2024

## 2024-08-03 ENCOUNTER — Inpatient Hospital Stay: Attending: Hematology

## 2024-08-03 ENCOUNTER — Inpatient Hospital Stay

## 2024-08-03 ENCOUNTER — Inpatient Hospital Stay (HOSPITAL_BASED_OUTPATIENT_CLINIC_OR_DEPARTMENT_OTHER): Admitting: Hematology

## 2024-08-03 VITALS — BP 176/92 | HR 84 | Temp 97.0°F | Resp 18 | Wt 167.2 lb

## 2024-08-03 DIAGNOSIS — C9000 Multiple myeloma not having achieved remission: Secondary | ICD-10-CM

## 2024-08-03 DIAGNOSIS — Z79899 Other long term (current) drug therapy: Secondary | ICD-10-CM | POA: Diagnosis not present

## 2024-08-03 DIAGNOSIS — M810 Age-related osteoporosis without current pathological fracture: Secondary | ICD-10-CM | POA: Diagnosis not present

## 2024-08-03 DIAGNOSIS — Z5111 Encounter for antineoplastic chemotherapy: Secondary | ICD-10-CM | POA: Insufficient documentation

## 2024-08-03 DIAGNOSIS — I4891 Unspecified atrial fibrillation: Secondary | ICD-10-CM | POA: Diagnosis not present

## 2024-08-03 DIAGNOSIS — Z5112 Encounter for antineoplastic immunotherapy: Secondary | ICD-10-CM | POA: Diagnosis not present

## 2024-08-03 DIAGNOSIS — Z7189 Other specified counseling: Secondary | ICD-10-CM

## 2024-08-03 DIAGNOSIS — Z7901 Long term (current) use of anticoagulants: Secondary | ICD-10-CM | POA: Insufficient documentation

## 2024-08-03 LAB — CBC WITH DIFFERENTIAL (CANCER CENTER ONLY)
Abs Immature Granulocytes: 0.01 K/uL (ref 0.00–0.07)
Basophils Absolute: 0 K/uL (ref 0.0–0.1)
Basophils Relative: 0 %
Eosinophils Absolute: 0.3 K/uL (ref 0.0–0.5)
Eosinophils Relative: 4 %
HCT: 32 % — ABNORMAL LOW (ref 36.0–46.0)
Hemoglobin: 10.3 g/dL — ABNORMAL LOW (ref 12.0–15.0)
Immature Granulocytes: 0 %
Lymphocytes Relative: 17 %
Lymphs Abs: 1.1 K/uL (ref 0.7–4.0)
MCH: 30.2 pg (ref 26.0–34.0)
MCHC: 32.2 g/dL (ref 30.0–36.0)
MCV: 93.8 fL (ref 80.0–100.0)
Monocytes Absolute: 0.4 K/uL (ref 0.1–1.0)
Monocytes Relative: 6 %
Neutro Abs: 4.9 K/uL (ref 1.7–7.7)
Neutrophils Relative %: 73 %
Platelet Count: 190 K/uL (ref 150–400)
RBC: 3.41 MIL/uL — ABNORMAL LOW (ref 3.87–5.11)
RDW: 15.4 % (ref 11.5–15.5)
WBC Count: 6.7 K/uL (ref 4.0–10.5)
nRBC: 0 % (ref 0.0–0.2)

## 2024-08-03 LAB — CMP (CANCER CENTER ONLY)
ALT: 16 U/L (ref 0–44)
AST: 14 U/L — ABNORMAL LOW (ref 15–41)
Albumin: 4.3 g/dL (ref 3.5–5.0)
Alkaline Phosphatase: 47 U/L (ref 38–126)
Anion gap: 6 (ref 5–15)
BUN: 16 mg/dL (ref 8–23)
CO2: 30 mmol/L (ref 22–32)
Calcium: 9.6 mg/dL (ref 8.9–10.3)
Chloride: 105 mmol/L (ref 98–111)
Creatinine: 0.89 mg/dL (ref 0.44–1.00)
GFR, Estimated: >60 mL/min (ref >60)
Glucose, Bld: 117 mg/dL — ABNORMAL HIGH (ref 70–99)
Potassium: 4.2 mmol/L (ref 3.5–5.1)
Sodium: 141 mmol/L (ref 135–145)
Total Bilirubin: 0.6 mg/dL (ref 0.0–1.2)
Total Protein: 7.1 g/dL (ref 6.5–8.1)

## 2024-08-03 MED ORDER — ACETAMINOPHEN 325 MG PO TABS
650.0000 mg | ORAL_TABLET | Freq: Once | ORAL | Status: AC
  Administered 2024-08-03: 650 mg via ORAL
  Filled 2024-08-03: qty 2

## 2024-08-03 MED ORDER — DARATUMUMAB-HYALURONIDASE-FIHJ 1800-30000 MG-UT/15ML ~~LOC~~ SOLN
1800.0000 mg | Freq: Once | SUBCUTANEOUS | Status: AC
  Administered 2024-08-03: 1800 mg via SUBCUTANEOUS
  Filled 2024-08-03: qty 15

## 2024-08-03 MED ORDER — BORTEZOMIB CHEMO SQ INJECTION 3.5 MG (2.5MG/ML)
1.3000 mg/m2 | Freq: Once | INTRAMUSCULAR | Status: AC
  Administered 2024-08-03: 2.25 mg via SUBCUTANEOUS
  Filled 2024-08-03: qty 0.9

## 2024-08-03 MED ORDER — MONTELUKAST SODIUM 10 MG PO TABS
10.0000 mg | ORAL_TABLET | Freq: Once | ORAL | Status: AC
  Administered 2024-08-03: 10 mg via ORAL
  Filled 2024-08-03: qty 1

## 2024-08-03 MED ORDER — DIPHENHYDRAMINE HCL 25 MG PO CAPS
50.0000 mg | ORAL_CAPSULE | Freq: Once | ORAL | Status: AC
  Administered 2024-08-03: 50 mg via ORAL
  Filled 2024-08-03: qty 2

## 2024-08-03 MED ORDER — DEXAMETHASONE 6 MG PO TABS
20.0000 mg | ORAL_TABLET | Freq: Once | ORAL | Status: AC
  Administered 2024-08-03: 20 mg via ORAL
  Filled 2024-08-03: qty 2

## 2024-08-03 MED ORDER — FAMOTIDINE 20 MG PO TABS
20.0000 mg | ORAL_TABLET | Freq: Once | ORAL | Status: AC
  Administered 2024-08-03: 20 mg via ORAL
  Filled 2024-08-03: qty 1

## 2024-08-03 NOTE — Progress Notes (Signed)
 HEMATOLOGY ONCOLOGY PROGRESS NOTE  Date of service: 08/03/2024  Patient Care Team: Larnell Hamilton, MD as PCP - General (Internal Medicine) Lavona Agent, MD as PCP - Cardiology (Cardiology) Cindie Ole DASEN, MD as PCP - Electrophysiology (Cardiology) Danielle Rom, MD as Consulting Physician (Obstetrics and Gynecology) Key, Hargis HERO, NP as Nurse Practitioner (Gynecology) Lavona Agent, MD as Consulting Physician (Cardiology) Onesimo Emaline Brink, MD as Consulting Physician (Hematology)  CHIEF COMPLAINT/PURPOSE OF CONSULTATION: Follow-up for newly diagnosed active multiple myeloma Cycle 3, Day 1 Daratumumab  + Velcade  + Dexamethasone   HISTORY OF PRESENTING ILLNESS: Mary Potter is a wonderful 78 y.o. 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   Today, she is accompanied by her daughter and son-in-law. Patient has had two vertebral compression fractures in her L-spine. She reports that her initial back fracture was from pulling too hard while adjusting her shoes, and the second occurred after pushing on a trash can lid.    Her back pain continues to be bothersome, though it has improved. She did previously have mild pain raidating to her left lower extremity, which has resolved. Back brace does improve back pain.   The first time she had a compression fracture, she was told that she was developing the beginnings of osteopenia. Patient reports that she did not tolerate Fosamax and she stopped taking it. She has not had a bone density study recently. Her last bone density study from 2018 showed osteopenia. Patient has never been on prolia  previously, though there are considerations to start it soon.    She reports having a muscle spasm on her left side, which is settling with muscle relaxants. She denies any leg swelling.    Patient reports a history of kidney stones, during which she had protein  in her urine.   Patient has regularly been on vitamin D . She has been off of calcium  for several years ago because of her fhx of heart disease.   SUMMARY OF ONCOLOGIC HISTORY: Oncology History Overview Note  MMR IHC intact MSI stable   Endometrial adenocarcinoma (HCC)  02/10/2021 Imaging   Pelvic ultrasound: thickened endometrium with slight vascularity at the fundus.  The lining measured 13.5 mm.     02/16/2021 Initial Biopsy   EMB: [facility]   03/17/2021 Surgery   Robotic-assisted laparoscopic total hysterectomy with bilateral salpingoophorectomy, SLN biopsy   Findings: On EUA, small mobile uterus. Normal upper abdominal survey on intra-abdominal entry. Normal omentum, small and large bowel. 6-8 cm uterus, normal appearing. Normal bilateral adnexa. Mapping successful to bilateral SLNs. No intra-abdominal or pelvic evidence of disease.   03/17/2021 Pathology Results   A. SENTINEL LYMPH NODE, RIGHT EXTERNAL ILIAC, EXCISION:  - One lymph node negative for metastatic carcinoma (0/1).   B. SENTINEL LYMPH NODE, LEFT OBTURATOR, EXCISION:  - One lymph node negative for metastatic carcinoma (0/1).   C. 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      Focal superficial myometrial invasion.      No lymphovascular invasion.      Cervix, bilateral ovaries and bilateral fallopian tubes negative for  carcinoma.   ONCOLOGY TABLE:  UTERUS, CARCINOMA OR CARCINOSARCOMA: Resection  Procedure: Total hysterectomy, bilateral salpingo-oophorectomy with  right and left sentinel lymph nodes.  Histologic Type: Endometrioid adenocarcinoma.  Histologic Grade: FIGO grade 1.  Myometrial Invasion:       Depth  of Myometrial Invasion: 2 mm.       Myometrial Thickness (mm): 25.       Percentage of Myometrial Invasion: 8%.  Uterine Serosa Involvement: Not identified.  Cervical stromal Involvement: Not  identified.  Extent of involvement of other tissue/organs: Not identified.  Lymphovascular Invasion: Not identified.  Regional Lymph Nodes:       Pelvic Lymph Nodes Examined: 2                                      Sentinel: 2                                      Non-sentinel: 0                                      Total: 2       Lymph Nodes with Metastasis: 0  Pathologic Stage Classification (pTNM, AJCC 8th Edition): pT1a, pN0  Ancillary Studies: MMR and MSI testing have been ordered.  Representative Tumor Block: C3-C9.  Comment(s): Cytokeratin AE1/AE3 performed on the sentinel lymph nodes  (parts A and B) is negative for metastatic carcinoma.  (v4.2.0.1)    03/20/2021 Initial Diagnosis   Endometrial adenocarcinoma (HCC)   Multiple myeloma not having achieved remission (HCC)  06/10/2024 Initial Diagnosis   Multiple myeloma not having achieved remission (HCC)   06/22/2024 -  Chemotherapy   Patient is on Treatment Plan : MYELOMA NEWLY DIAGNOSED TRANSPLANT CANDIDATE DaraVRd (Daratumumab  SQ) q21d x 6 Cycles (Induction/Consolidation)     Patient is on Treatment Plan :  MYELOMA NEWLY DIAGNOSED TRANSPLANT CANDIDATE DaraVRd (Daratumumab  SQ) q21d x 6 Cycles (Induction/Consolidation)    INTERVAL HISTORY: Mary Potter is a 78 y.o. female who is here today for follow-up of newly diagnosed active multiple myeloma. She is here with her husband today.  she was last seen by me on 06/29/2024; at the time she mentioned experiencing trace leg welling attribute to steroids, some difficulty sleeping w/ grade 1 fatigue, otherwise no acute symptoms.   On 07/13/2024, she was seen by Neomi Johnston DASEN, PA-C and at the time she was doing well overall, but noted some constipation, for which she was taking Miralax once daily with some relief.  Today, she is seen today for Cycle 3, Day 1 of Daratumumab  + Velcade  + Dexamethasone . Says that she is feeling well overall, though notes some pain in her left ankle,  attributed to s/p fusion, occasional SOB - hx of Afib - since last treatment with fluctuating severity, and some leg swelling that improves with elevation. Denies new bone pains, fever/chills, night sweats, skin rashes, nausea, change in bowel habits, or problems urinating. She says that she has been sleeping a bit better since she has stopped post-treatment steroids.   REVIEW OF SYSTEMS:    10 Point review of systems of done and is negative except as noted above.  MEDICAL HISTORY Past Medical History:  Diagnosis Date   Atrial fibrillation (HCC)    Dysrhythmia    A-fib   Endometrial hyperplasia 01/03/2015   GERD (gastroesophageal reflux disease)    History of kidney stones    Hyperlipidemia    Hypertension    Pneumonia    PONV (postoperative nausea and vomiting)  slow to wake up   Thickened endometrium 01/03/2015   Vaginal delivery 1975, 1985    SURGICAL HISTORY Past Surgical History:  Procedure Laterality Date   ABDOMINAL HYSTERECTOMY     ANKLE FRACTURE SURGERY     ATRIAL FIBRILLATION ABLATION N/A 02/08/2022   Procedure: ATRIAL FIBRILLATION ABLATION;  Surgeon: Cindie Ole DASEN, MD;  Location: [facility];  Service: Cardiovascular;  Laterality: N/A;   CARDIOVERSION N/A 02/17/2023   Procedure: CARDIOVERSION;  Surgeon: Sheena Pugh, DO;  Location: [facility];  Service: Cardiovascular;  Laterality: N/A;   CATARACT EXTRACTION Right 2020   DILATION AND CURETTAGE OF UTERUS  2012   ablation    EYE SURGERY     Corneal growth removal on right eye   HYSTEROSCOPY WITH D & C N/A 01/03/2015   Procedure: DILATATION AND CURETTAGE /HYSTEROSCOPY;  Surgeon: Robbi Render, MD;  Location: [facility];  Service: Gynecology;  Laterality: N/A;   LAPAROSCOPY     for endometriosis   ROBOTIC ASSISTED TOTAL HYSTERECTOMY WITH BILATERAL SALPINGO OOPHERECTOMY N/A 03/17/2021   Procedure: XI ROBOTIC ASSISTED TOTAL HYSTERECTOMY WITH BILATERAL SALPINGO OOPHORECTOMY;  Surgeon: Viktoria Comer SAUNDERS,  MD;  Location: [facility];  Service: Gynecology;  Laterality: N/A;   SENTINEL NODE BIOPSY N/A 03/17/2021   Procedure: SENTINEL NODE BIOPSY;  Surgeon: Viktoria Comer SAUNDERS, MD;  Location: [facility];  Service: Gynecology;  Laterality: N/A;   TONSILLECTOMY AND ADENOIDECTOMY      SOCIAL HISTORY Social History   Tobacco Use   Smoking status: Never    Passive exposure: Past   Smokeless tobacco: Never   Tobacco comments:    Never smoke 03/02/23  Vaping Use   Vaping status: Never Used  Substance Use Topics   Alcohol  use: No    Alcohol /week: 0.0 standard drinks of alcohol    Drug use: No    Social History   Social History Narrative   Lives with her younger daughter, [name]. Her older daughter lives in [place] with her family.    SOCIAL DRIVERS OF HEALTH SDOH Screenings   Food Insecurity: No Food Insecurity (06/19/2024)  Housing: Low Risk  (06/19/2024)  Transportation Needs: No Transportation Needs (06/19/2024)  Utilities: Not At Risk (06/19/2024)  Depression (PHQ2-9): Low Risk  (08/03/2024)  Tobacco Use: Low Risk  (05/14/2024)     FAMILY HISTORY Family History  Problem Relation Age of Onset   Heart disease Mother 31       CABG   Stroke Father    Prostate cancer Father    Heart disease Brother 53       Transplant, died 10 years   Heart disease Maternal Grandmother    Heart disease Paternal Grandmother    Cancer Paternal Grandfather    Heart disease Brother 38       RF   Leukemia Brother    Leukemia Brother    Hypertension Sister    Cervical cancer Sister    Colon cancer Neg Hx    Breast cancer Neg Hx    Ovarian cancer Neg Hx    Endometrial cancer Neg Hx    Pancreatic cancer Neg Hx      ALLERGIES: is allergic to talwin [pentazocine], benadryl  [diphenhydramine ], hydrocodone, and prednisone .  MEDICATIONS  Current Outpatient Medications  Medication Sig Dispense Refill   acetaminophen  (TYLENOL ) 500 MG tablet Take 1,000 mg by mouth every 6 (six) hours as needed  (Arthritis Pain).     acyclovir  (ZOVIRAX ) 400 MG tablet Take 1 tablet (400 mg total) by  mouth 2 (two) times daily. 60 tablet 5   ALPRAZolam (XANAX) 0.5 MG tablet Take 0.25 mg by mouth daily as needed (anxiousness).     apixaban  (ELIQUIS ) 5 MG TABS tablet Take 1 tablet (5 mg total) by mouth 2 (two) times daily. 180 tablet 3   b complex vitamins capsule Take 1 capsule by mouth daily.     Calcium  Carb-Cholecalciferol 600-3.125 MG-MCG TABS      Cholecalciferol (VITAMIN D ) 50 MCG (2000 UT) CAPS Take 2,000 Units by mouth at bedtime.     esomeprazole  (NEXIUM ) 40 MG capsule Take 1 capsule (40 mg total) by mouth daily before breakfast. 30 capsule 2   estradiol  (VIVELLE -DOT) 0.025 MG/24HR PLACE 1 PATCH ONTO THE SKIN 2 (TWO) TIMES A WEEK. SUNDAYS & WEDNESDAYS 8 patch 0   metoprolol  tartrate (LOPRESSOR ) 50 MG tablet TAKE ONE TABLET BY MOUTH TWICE A DAY 180 tablet 3   olmesartan (BENICAR) 40 MG tablet Take 40 mg by mouth daily.     ondansetron  (ZOFRAN ) 8 MG tablet Take 1 tablet (8 mg total) by mouth every 8 (eight) hours as needed for nausea or vomiting. 30 tablet 1   Polyethyl Glycol-Propyl Glycol (SYSTANE OP) Place 1 drop into both eyes daily.     prochlorperazine  (COMPAZINE ) 10 MG tablet Take 1 tablet (10 mg total) by mouth every 6 (six) hours as needed for nausea or vomiting. 30 tablet 1   rosuvastatin  (CRESTOR ) 20 MG tablet Take 1 tablet (20 mg total) by mouth every evening. 90 tablet 3   dexamethasone  (DECADRON ) 4 MG tablet Take 5 tabs (20 mg) weekly the day after daratumumab  for 12 weeks. Take with breakfast. (Patient not taking: Reported on 08/03/2024) 20 tablet 5   No current facility-administered medications for this visit.   Facility-Administered Medications Ordered in Other Visits  Medication Dose Route Frequency Provider Last Rate Last Admin   bortezomib  SQ (VELCADE ) chemo injection (2.5mg /mL concentration) 2.25 mg  1.3 mg/m2 (Treatment Plan Recorded) Subcutaneous Once Shermeka Rutt Kishore, MD        daratumumab -hyaluronidase -fihj (DARZALEX  FASPRO) 1800-30000 MG-UT/15ML chemo SQ injection 1,800 mg  1,800 mg Subcutaneous Once Onesimo Emaline Brink, MD        VITALS: Vitals:   08/03/24 1014 08/03/24 1023  BP: (!) 186/86 (!) 176/92  Pulse: 84   Resp: 18   Temp: (!) 97 F (36.1 C)   SpO2: 99%    Filed Weights   08/03/24 1014  Weight: 167 lb 3.2 oz (75.8 kg)   Body mass index is 29.62 kg/m.  PHYSICAL EXAMINATION: ECOG PERFORMANCE STATUS: 1 - Symptomatic but completely ambulatory  GENERAL: alert, in no acute distress and comfortable SKIN: no acute rashes, no significant lesions EYES: conjunctiva are pink and non-injected, sclera anicteric OROPHARYNX: MMM, no exudates, no oropharyngeal erythema or ulceration NECK: supple, no JVD LYMPH:  no palpable lymphadenopathy in the cervical, axillary or inguinal regions LUNGS: clear to auscultation b/l with normal respiratory effort HEART: regular rate & rhythm ABDOMEN:  normoactive bowel sounds , non tender, not distended. Extremity: no pedal edema PSYCH: alert & oriented x 3 with fluent speech NEURO: no focal motor/sensory deficits  LABORATORY DATA:   I have reviewed the data as listed     Latest Ref Rng & Units 08/03/2024    9:35 AM 07/27/2024    1:11 PM 07/20/2024    2:45 PM  CBC EXTENDED  WBC 4.0 - 10.5 K/uL 6.7  6.5  8.1   RBC 3.87 - 5.11 MIL/uL 3.41  3.31  3.11   Hemoglobin 12.0 - 15.0 g/dL 89.6  89.8  9.5   HCT 63.9 - 46.0 % 32.0  31.0  29.5   Platelets 150 - 400 K/uL 190  183  176   NEUT# 1.7 - 7.7 K/uL 4.9  4.9  6.5   Lymph# 0.7 - 4.0 K/uL 1.1  0.9  0.8       Latest Ref Rng & Units 08/03/2024    9:35 AM 07/27/2024    1:11 PM 07/20/2024    2:45 PM  CMP  Glucose 70 - 99 mg/dL 882  891  99   BUN 8 - 23 mg/dL 16  18  18    Creatinine 0.44 - 1.00 mg/dL 9.10  9.11  9.15   Sodium 135 - 145 mmol/L 141  141  140   Potassium 3.5 - 5.1 mmol/L 4.2  4.3  3.9   Chloride 98 - 111 mmol/L 105  105  105   CO2 22 - 32 mmol/L 30   31  29    Calcium  8.9 - 10.3 mg/dL 9.6  9.2  8.8   Total Protein 6.5 - 8.1 g/dL 7.1  6.5  6.4   Total Bilirubin 0.0 - 1.2 mg/dL 0.6  0.5  0.5   Alkaline Phos 38 - 126 U/L 47  46  49   AST 15 - 41 U/L 14  12  14    ALT 0 - 44 U/L 16  13  12     MULTIPLE MYELOMA PANEL 07/13/2024  IgG (Immunoglobin G), Serum     586 - 1,602 mg/dL 481 (L)   IgA     64 - 422 mg/dL 685   IgM (Immunoglobulin M), Srm     26 - 217 mg/dL 27   Total Protein ELP     6.0 - 8.5 g/dL 6.3 (C)  Albumin SerPl Elph-Mcnc     2.9 - 4.4 g/dL 3.3 (C)  Alpha 1     0.0 - 0.4 g/dL 0.2 (C)  Alpha2 Glob SerPl Elph-Mcnc     0.4 - 1.0 g/dL 1.0 (C)  B-Globulin SerPl Elph-Mcnc     0.7 - 1.3 g/dL 1.1 (C)  Gamma Glob SerPl Elph-Mcnc     0.4 - 1.8 g/dL 0.7 (C)  M Protein SerPl Elph-Mcnc     Not Observed g/dL 0.2 (H) (C)  Globulin, Total     2.2 - 3.9 g/dL 3.0 (C)  Albumin/Glob SerPl     0.7 - 1.7  1.2 (C)  IFE 1 Immunofixation shows IgG monoclonal protein with kappa light chain  specificity.  PLEASE NOTE:  Samples from patients receiving DARZALEX (R) (daratumumab ) or  SARCLISA(R)(isatuximab-irfc) treatment can appear as an  IgG kappa and mask a complete response (CR). If this patient  is receiving these therapies, this IFE assay interference  can be removed by ordering test number [id number]-Immunofixation,  Daratumumab -Specific, Serum or 123062-Immunofixation,  Isatuximab-Specific, Serum and submitting a new sample for  testing or by calling the lab to add this test to the current  sample.  Immunofixation shows IgA monoclonal protein with kappa light chain  specificity.  A faint band was noted in lambda; suggestive of free monoclonal  protein. Recommend retesting in 4-6 months.    KAPPA/LAMBDA LIGHT CHAINS 07/13/2024  Kappa free light chain     3.3 - 19.4 mg/L 6.9   Lambda free light chains     5.7 - 26.3 mg/L 2.2 (L)   Kappa, lambda light chain ratio  0.26 - 1.65  3.14 (H)     IFE/PE, serum  07/21/2023:  05/14/2024 Surgical Pathology   Bone Marrow Report   Clinical History: MYELOMA   DIAGNOSIS:   BONE MARROW, ASPIRATE, CLOT, CORE:  - Hypercellular bone marrow (60%) involved by plasma cell neoplasm (63%  plasma cells by manual aspirate differential, approximately 70% by CD138  immunohistochemical analysis, and kappa monotypic by kappa/lambda in  situ hybridization).  See comment.   PERIPHERAL BLOOD:  - Normocytic normochromic anemia   COMMENT:   Correlation with clinical findings, radiographic skeletal survey,  myeloma panel and other laboratory tests (SPEP, UPEP, beta  microglobulin, etc.) is recommended for a complete assessment and  classification of this case.     05/18/2024     RADIOGRAPHIC STUDIES: I have personally reviewed the radiological images as listed and agreed with the findings in the report. No results found.  ASSESSMENT & PLAN:  78 y.o. female with  Newly diagnosed IgA kappa active multiple myeloma. PET/CT 04/30/2024 nonspecific right humeral shaft uptake.  No definitive bone lesions. Bone marrow biopsy shows 70% kappa restricted plasma cells consistent with active myeloma based on the presence of more than 60% involvement with plasma cell neoplasm and also based on light chain criteria. - Molecular cytogenetics show 13 q. deletion suggesting standard risk myeloma. Pretreatment serology show IgA level of 1886 and an M spike of 1 g/dL Kappa free light chains of 265 with a kappa lambda ratio of 106 --Started Dara/Velcade /Dex on 06/22/2024   2. History of A-fib and is on anticoagulation.  No previous history of MI or CVA or VTE.   3. History of hiatal hernia   4.  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   Multiple myeloma not having achieved remission (HCC) 06/10/2024   Counseling regarding advance care planning and goals of care 06/10/2024   Coronary artery disease involving native  coronary artery of native heart without angina pectoris 03/28/2023   Hypercoagulable state due to paroxysmal atrial fibrillation (HCC) 03/02/2023   Persistent atrial fibrillation (HCC) 03/02/2023   Endometrial adenocarcinoma (HCC) 03/20/2021   Complex endometrial hyperplasia with atypia 02/18/2021   PAF (paroxysmal atrial fibrillation) (HCC) 10/15/2020   Elevated coronary artery calcium  score 09/14/2020   Essential hypertension 09/14/2020   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 06/29/2018   Greater trochanteric bursitis of left hip 04/18/2018   Acute left lumbar radiculopathy 03/21/2018   Iliotibial band tendinitis of right side 09/30/2016   Trigger finger, right middle finger 09/30/2016   Thickened endometrium 01/03/2015   Endometrial hyperplasia 01/03/2015   Tibialis posterior tendonitis 05/09/2014   H/O ankle fusion 05/09/2014   Left leg pain 04/17/2014   Lipid disorder 11/14/2012   GERD 09/11/2010   CONSTIPATION 09/11/2010   DIARRHEA 09/11/2010   PLAN:  1 - Discussed lab results on 08/03/2024 in detail with patient: CBC stable WBC of 6.7K, Hemoglobin of 10.3, and PLTs of 190K. CMP normal with Creatinine 0.89 and Calcium  9.6.  Myeloma Panel and Kappa/Lamda Light Chains pending - will review results and inform patient afterward  - Reviewed 07/13/2024 Myeloma Panel showed improvement of IgA to normal levels and M protein stable at 0.2 g/dL.  Kappa/Lambda Lights Chains improved significantly with Kappa Light Chains normalizing to 6.9 from 265.9  - Discussed adding Revlimid to treatment regimen for more response, but this could also be her maintenance regimen if she responds well overall. - Reviewed indications of partial remission vs  complete remission and treatments following these prognoses.  - Offered other alternative treatments, however she has responded well, and there is no indication at this time to or change or add 4th drug to treatment.  - Continue with  Cycle 3, Day 1 of Daratumumab  + Velcade  + Dexamethasone  Proceed with Xgeva  today - Continue with Acyclovir  for VZV prophylaxis  2 - Patient noted occasional SOB since last treatment with fluctuating severity - Continue Eliquis    FOLLOW-UP  - Plz schedule next 2 cycles of DVd per integrated scheduling  The total time spent in the appointment was *** minutes* .  All of the patient's questions were answered and the patient knows to call the clinic with any problems, questions, or concerns.  Emaline Saran MD MS AAHIVMS [facility] Hematology/Oncology Physician [facility]  *Total Encounter Time as defined by the Centers for Medicare and Medicaid Services includes, in addition to the face-to-face time of a patient visit (documented in the note above) non-face-to-face time: obtaining and reviewing outside history, ordering and reviewing medications, tests or procedures, care coordination (communications with other health care professionals or caregivers) and documentation in the medical record.  I,Emily Lagle,acting as a Neurosurgeon for Emaline Saran, MD.,have documented all relevant documentation on the behalf of Emaline Saran, MD,as directed by  Emaline Saran, MD while in the presence of Emaline Saran, MD.  I have reviewed the above documentation for accuracy and completeness, and I agree with the above.  Thai Hemrick, MD

## 2024-08-03 NOTE — Patient Instructions (Signed)
 [facility] - A DEPT OF [facility]  Discharge Instructions: Thank you for choosing [facility] to provide your oncology and hematology care.   If you have a lab appointment with the Cancer Center, please go directly to the Cancer Center and check in at the registration area.   Wear comfortable clothing and clothing appropriate for easy access to any Portacath or PICC line.   We strive to give you quality time with your provider. You may need to reschedule your appointment if you arrive late (15 or more minutes).  Arriving late affects you and other patients whose appointments are after yours.  Also, if you miss three or more appointments without notifying the office, you may be dismissed from the clinic at the provider's discretion.      For prescription refill requests, have your pharmacy contact our office and allow 72 hours for refills to be completed.    Today you received the following chemotherapy and/or immunotherapy agents darzalex  faspro and velcade       To help prevent nausea and vomiting after your treatment, we encourage you to take your nausea medication as directed.  BELOW ARE SYMPTOMS THAT SHOULD BE REPORTED IMMEDIATELY: *FEVER GREATER THAN 100.4 F (38 C) OR HIGHER *CHILLS OR SWEATING *NAUSEA AND VOMITING THAT IS NOT CONTROLLED WITH YOUR NAUSEA MEDICATION *UNUSUAL SHORTNESS OF BREATH *UNUSUAL BRUISING OR BLEEDING *URINARY PROBLEMS (pain or burning when urinating, or frequent urination) *BOWEL PROBLEMS (unusual diarrhea, constipation, pain near the anus) TENDERNESS IN MOUTH AND THROAT WITH OR WITHOUT PRESENCE OF ULCERS (sore throat, sores in mouth, or a toothache) UNUSUAL RASH, SWELLING OR PAIN  UNUSUAL VAGINAL DISCHARGE OR ITCHING   Items with * indicate a potential emergency and should be followed up as soon as possible or go to the Emergency Department if any problems should occur.  Please show the CHEMOTHERAPY ALERT CARD  or IMMUNOTHERAPY ALERT CARD at check-in to the Emergency Department and triage nurse.  Should you have questions after your visit or need to cancel or reschedule your appointment, please contact [facility]  Dept: [phone]  and follow the prompts.  Office hours are 8:00 a.m. to 4:30 p.m. Monday - Friday. Please note that voicemails left after 4:00 p.m. may not be returned until the following business day.  We are closed weekends and major holidays. You have access to a nurse at all times for urgent questions. Please call the main number to the clinic Dept: [phone] and follow the prompts.   For any non-urgent questions, you may also contact your provider using MyChart. We now offer e-Visits for anyone 43 and older to request care online for non-urgent symptoms. For details visit [email or web address].   Also download the MyChart app! Go to the app store, search MyChart, open the app, select [facility], and log in with your MyChart username and password.

## 2024-08-06 LAB — KAPPA/LAMBDA LIGHT CHAINS
Kappa free light chain: 4.4 mg/L (ref 3.3–19.4)
Kappa, lambda light chain ratio: 2.32 — ABNORMAL HIGH (ref 0.26–1.65)
Lambda free light chains: 1.9 mg/L — ABNORMAL LOW (ref 5.7–26.3)

## 2024-08-06 NOTE — Addendum Note (Signed)
 Encounter addended by: Delford Beula LABOR, RN on: 08/06/2024 7:58 AM  Actions taken: Therapy plan modified, Order list changed

## 2024-08-08 LAB — MULTIPLE MYELOMA PANEL, SERUM
Albumin SerPl Elph-Mcnc: 3.6 g/dL (ref 2.9–4.4)
Albumin/Glob SerPl: 1.4 (ref 0.7–1.7)
Alpha 1: 0.2 g/dL (ref 0.0–0.4)
Alpha2 Glob SerPl Elph-Mcnc: 0.9 g/dL (ref 0.4–1.0)
B-Globulin SerPl Elph-Mcnc: 1.1 g/dL (ref 0.7–1.3)
Gamma Glob SerPl Elph-Mcnc: 0.4 g/dL (ref 0.4–1.8)
Globulin, Total: 2.6 g/dL (ref 2.2–3.9)
IgA: 76 mg/dL (ref 64–422)
IgG (Immunoglobin G), Serum: 517 mg/dL — ABNORMAL LOW (ref 586–1602)
IgM (Immunoglobulin M), Srm: 19 mg/dL — ABNORMAL LOW (ref 26–217)
M Protein SerPl Elph-Mcnc: 0.2 g/dL — ABNORMAL HIGH
Total Protein ELP: 6.2 g/dL (ref 6.0–8.5)

## 2024-08-09 ENCOUNTER — Encounter: Payer: Self-pay | Admitting: Hematology

## 2024-08-10 ENCOUNTER — Inpatient Hospital Stay

## 2024-08-10 VITALS — BP 147/79 | HR 77 | Temp 97.9°F | Resp 19 | Wt 163.8 lb

## 2024-08-10 DIAGNOSIS — C9000 Multiple myeloma not having achieved remission: Secondary | ICD-10-CM | POA: Diagnosis not present

## 2024-08-10 DIAGNOSIS — Z7189 Other specified counseling: Secondary | ICD-10-CM

## 2024-08-10 DIAGNOSIS — Z7901 Long term (current) use of anticoagulants: Secondary | ICD-10-CM | POA: Diagnosis not present

## 2024-08-10 DIAGNOSIS — Z79899 Other long term (current) drug therapy: Secondary | ICD-10-CM | POA: Diagnosis not present

## 2024-08-10 DIAGNOSIS — Z5111 Encounter for antineoplastic chemotherapy: Secondary | ICD-10-CM | POA: Diagnosis not present

## 2024-08-10 DIAGNOSIS — M810 Age-related osteoporosis without current pathological fracture: Secondary | ICD-10-CM | POA: Diagnosis not present

## 2024-08-10 DIAGNOSIS — I4891 Unspecified atrial fibrillation: Secondary | ICD-10-CM | POA: Diagnosis not present

## 2024-08-10 LAB — CBC WITH DIFFERENTIAL (CANCER CENTER ONLY)
Abs Immature Granulocytes: 0.02 K/uL (ref 0.00–0.07)
Basophils Absolute: 0 K/uL (ref 0.0–0.1)
Basophils Relative: 0 %
Eosinophils Absolute: 0.2 K/uL (ref 0.0–0.5)
Eosinophils Relative: 3 %
HCT: 32.9 % — ABNORMAL LOW (ref 36.0–46.0)
Hemoglobin: 10.8 g/dL — ABNORMAL LOW (ref 12.0–15.0)
Immature Granulocytes: 0 %
Lymphocytes Relative: 14 %
Lymphs Abs: 1 K/uL (ref 0.7–4.0)
MCH: 30.3 pg (ref 26.0–34.0)
MCHC: 32.8 g/dL (ref 30.0–36.0)
MCV: 92.2 fL (ref 80.0–100.0)
Monocytes Absolute: 0.5 K/uL (ref 0.1–1.0)
Monocytes Relative: 8 %
Neutro Abs: 5 K/uL (ref 1.7–7.7)
Neutrophils Relative %: 75 %
Platelet Count: 213 K/uL (ref 150–400)
RBC: 3.57 MIL/uL — ABNORMAL LOW (ref 3.87–5.11)
RDW: 14.7 % (ref 11.5–15.5)
WBC Count: 6.7 K/uL (ref 4.0–10.5)
nRBC: 0 % (ref 0.0–0.2)

## 2024-08-10 LAB — CMP (CANCER CENTER ONLY)
ALT: 13 U/L (ref 0–44)
AST: 15 U/L (ref 15–41)
Albumin: 4.4 g/dL (ref 3.5–5.0)
Alkaline Phosphatase: 43 U/L (ref 38–126)
Anion gap: 6 (ref 5–15)
BUN: 18 mg/dL (ref 8–23)
CO2: 29 mmol/L (ref 22–32)
Calcium: 9.7 mg/dL (ref 8.9–10.3)
Chloride: 103 mmol/L (ref 98–111)
Creatinine: 0.78 mg/dL (ref 0.44–1.00)
GFR, Estimated: >60 mL/min (ref >60)
Glucose, Bld: 104 mg/dL — ABNORMAL HIGH (ref 70–99)
Potassium: 4.3 mmol/L (ref 3.5–5.1)
Sodium: 138 mmol/L (ref 135–145)
Total Bilirubin: 0.6 mg/dL (ref 0.0–1.2)
Total Protein: 7 g/dL (ref 6.5–8.1)

## 2024-08-10 MED ORDER — MONTELUKAST SODIUM 10 MG PO TABS
10.0000 mg | ORAL_TABLET | Freq: Once | ORAL | Status: AC
  Administered 2024-08-10: 10 mg via ORAL
  Filled 2024-08-10: qty 1

## 2024-08-10 MED ORDER — DIPHENHYDRAMINE HCL 25 MG PO CAPS
50.0000 mg | ORAL_CAPSULE | Freq: Once | ORAL | Status: AC
  Administered 2024-08-10: 50 mg via ORAL
  Filled 2024-08-10: qty 2

## 2024-08-10 MED ORDER — DEXAMETHASONE 6 MG PO TABS
20.0000 mg | ORAL_TABLET | Freq: Once | ORAL | Status: AC
  Administered 2024-08-10: 20 mg via ORAL
  Filled 2024-08-10: qty 2

## 2024-08-10 MED ORDER — DENOSUMAB 120 MG/1.7ML ~~LOC~~ SOLN
120.0000 mg | Freq: Once | SUBCUTANEOUS | Status: AC
  Administered 2024-08-10: 120 mg via SUBCUTANEOUS
  Filled 2024-08-10: qty 1.7

## 2024-08-10 MED ORDER — FAMOTIDINE 20 MG PO TABS
20.0000 mg | ORAL_TABLET | Freq: Once | ORAL | Status: AC
  Administered 2024-08-10: 20 mg via ORAL
  Filled 2024-08-10: qty 1

## 2024-08-10 MED ORDER — DARATUMUMAB-HYALURONIDASE-FIHJ 1800-30000 MG-UT/15ML ~~LOC~~ SOLN
1800.0000 mg | Freq: Once | SUBCUTANEOUS | Status: AC
  Administered 2024-08-10: 1800 mg via SUBCUTANEOUS
  Filled 2024-08-10: qty 15

## 2024-08-10 MED ORDER — BORTEZOMIB CHEMO SQ INJECTION 3.5 MG (2.5MG/ML)
1.3000 mg/m2 | Freq: Once | INTRAMUSCULAR | Status: AC
  Administered 2024-08-10: 2.25 mg via SUBCUTANEOUS
  Filled 2024-08-10: qty 0.9

## 2024-08-10 MED ORDER — ACETAMINOPHEN 325 MG PO TABS
650.0000 mg | ORAL_TABLET | Freq: Once | ORAL | Status: AC
  Administered 2024-08-10: 650 mg via ORAL
  Filled 2024-08-10: qty 2

## 2024-08-10 NOTE — Patient Instructions (Signed)
 [facility] - A DEPT OF [facility]  Discharge Instructions: Thank you for choosing [facility] to provide your oncology and hematology care.   If you have a lab appointment with the Cancer Center, please go directly to the Cancer Center and check in at the registration area.   Wear comfortable clothing and clothing appropriate for easy access to any Portacath or PICC line.   We strive to give you quality time with your provider. You may need to reschedule your appointment if you arrive late (15 or more minutes).  Arriving late affects you and other patients whose appointments are after yours.  Also, if you miss three or more appointments without notifying the office, you may be dismissed from the clinic at the provider's discretion.      For prescription refill requests, have your pharmacy contact our office and allow 72 hours for refills to be completed.    Today you received the following chemotherapy and/or immunotherapy agents darzalex  faspro and velcade       To help prevent nausea and vomiting after your treatment, we encourage you to take your nausea medication as directed.  BELOW ARE SYMPTOMS THAT SHOULD BE REPORTED IMMEDIATELY: *FEVER GREATER THAN 100.4 F (38 C) OR HIGHER *CHILLS OR SWEATING *NAUSEA AND VOMITING THAT IS NOT CONTROLLED WITH YOUR NAUSEA MEDICATION *UNUSUAL SHORTNESS OF BREATH *UNUSUAL BRUISING OR BLEEDING *URINARY PROBLEMS (pain or burning when urinating, or frequent urination) *BOWEL PROBLEMS (unusual diarrhea, constipation, pain near the anus) TENDERNESS IN MOUTH AND THROAT WITH OR WITHOUT PRESENCE OF ULCERS (sore throat, sores in mouth, or a toothache) UNUSUAL RASH, SWELLING OR PAIN  UNUSUAL VAGINAL DISCHARGE OR ITCHING   Items with * indicate a potential emergency and should be followed up as soon as possible or go to the Emergency Department if any problems should occur.  Please show the CHEMOTHERAPY ALERT CARD  or IMMUNOTHERAPY ALERT CARD at check-in to the Emergency Department and triage nurse.  Should you have questions after your visit or need to cancel or reschedule your appointment, please contact [facility]  Dept: [phone]  and follow the prompts.  Office hours are 8:00 a.m. to 4:30 p.m. Monday - Friday. Please note that voicemails left after 4:00 p.m. may not be returned until the following business day.  We are closed weekends and major holidays. You have access to a nurse at all times for urgent questions. Please call the main number to the clinic Dept: [phone] and follow the prompts.   For any non-urgent questions, you may also contact your provider using MyChart. We now offer e-Visits for anyone 43 and older to request care online for non-urgent symptoms. For details visit [email or web address].   Also download the MyChart app! Go to the app store, search MyChart, open the app, select [facility], and log in with your MyChart username and password.

## 2024-08-17 ENCOUNTER — Inpatient Hospital Stay

## 2024-08-17 ENCOUNTER — Inpatient Hospital Stay (HOSPITAL_BASED_OUTPATIENT_CLINIC_OR_DEPARTMENT_OTHER): Admitting: Hematology

## 2024-08-17 VITALS — BP 128/84 | HR 78 | Temp 97.6°F | Resp 17 | Ht 63.0 in | Wt 164.0 lb

## 2024-08-17 DIAGNOSIS — C9000 Multiple myeloma not having achieved remission: Secondary | ICD-10-CM

## 2024-08-17 DIAGNOSIS — Z5112 Encounter for antineoplastic immunotherapy: Secondary | ICD-10-CM

## 2024-08-17 DIAGNOSIS — Z7189 Other specified counseling: Secondary | ICD-10-CM

## 2024-08-17 DIAGNOSIS — Z5111 Encounter for antineoplastic chemotherapy: Secondary | ICD-10-CM | POA: Diagnosis not present

## 2024-08-17 DIAGNOSIS — Z7901 Long term (current) use of anticoagulants: Secondary | ICD-10-CM | POA: Diagnosis not present

## 2024-08-17 DIAGNOSIS — Z79899 Other long term (current) drug therapy: Secondary | ICD-10-CM | POA: Diagnosis not present

## 2024-08-17 DIAGNOSIS — I4891 Unspecified atrial fibrillation: Secondary | ICD-10-CM | POA: Diagnosis not present

## 2024-08-17 DIAGNOSIS — M810 Age-related osteoporosis without current pathological fracture: Secondary | ICD-10-CM | POA: Diagnosis not present

## 2024-08-17 LAB — CMP (CANCER CENTER ONLY)
ALT: 12 U/L (ref 0–44)
AST: 13 U/L — ABNORMAL LOW (ref 15–41)
Albumin: 4 g/dL (ref 3.5–5.0)
Alkaline Phosphatase: 40 U/L (ref 38–126)
Anion gap: 6 (ref 5–15)
BUN: 17 mg/dL (ref 8–23)
CO2: 28 mmol/L (ref 22–32)
Calcium: 9.5 mg/dL (ref 8.9–10.3)
Chloride: 106 mmol/L (ref 98–111)
Creatinine: 0.64 mg/dL (ref 0.44–1.00)
GFR, Estimated: >60 mL/min (ref >60)
Glucose, Bld: 114 mg/dL — ABNORMAL HIGH (ref 70–99)
Potassium: 3.8 mmol/L (ref 3.5–5.1)
Sodium: 140 mmol/L (ref 135–145)
Total Bilirubin: 0.5 mg/dL (ref 0.0–1.2)
Total Protein: 6.4 g/dL — ABNORMAL LOW (ref 6.5–8.1)

## 2024-08-17 LAB — CBC WITH DIFFERENTIAL (CANCER CENTER ONLY)
Abs Immature Granulocytes: 0.01 K/uL (ref 0.00–0.07)
Basophils Absolute: 0 K/uL (ref 0.0–0.1)
Basophils Relative: 0 %
Eosinophils Absolute: 0.2 K/uL (ref 0.0–0.5)
Eosinophils Relative: 3 %
HCT: 30.6 % — ABNORMAL LOW (ref 36.0–46.0)
Hemoglobin: 10 g/dL — ABNORMAL LOW (ref 12.0–15.0)
Immature Granulocytes: 0 %
Lymphocytes Relative: 14 %
Lymphs Abs: 1.1 K/uL (ref 0.7–4.0)
MCH: 29.8 pg (ref 26.0–34.0)
MCHC: 32.7 g/dL (ref 30.0–36.0)
MCV: 91.1 fL (ref 80.0–100.0)
Monocytes Absolute: 0.5 K/uL (ref 0.1–1.0)
Monocytes Relative: 7 %
Neutro Abs: 5.8 K/uL (ref 1.7–7.7)
Neutrophils Relative %: 76 %
Platelet Count: 204 K/uL (ref 150–400)
RBC: 3.36 MIL/uL — ABNORMAL LOW (ref 3.87–5.11)
RDW: 14.4 % (ref 11.5–15.5)
WBC Count: 7.6 K/uL (ref 4.0–10.5)
nRBC: 0 % (ref 0.0–0.2)

## 2024-08-17 MED ORDER — ACETAMINOPHEN 325 MG PO TABS
650.0000 mg | ORAL_TABLET | Freq: Once | ORAL | Status: AC
  Administered 2024-08-17: 650 mg via ORAL
  Filled 2024-08-17: qty 2

## 2024-08-17 MED ORDER — DIPHENHYDRAMINE HCL 25 MG PO CAPS
50.0000 mg | ORAL_CAPSULE | Freq: Once | ORAL | Status: AC
  Administered 2024-08-17: 50 mg via ORAL
  Filled 2024-08-17: qty 2

## 2024-08-17 MED ORDER — BORTEZOMIB CHEMO SQ INJECTION 3.5 MG (2.5MG/ML)
1.3000 mg/m2 | Freq: Once | INTRAMUSCULAR | Status: AC
  Administered 2024-08-17: 2.25 mg via SUBCUTANEOUS
  Filled 2024-08-17: qty 0.9

## 2024-08-17 MED ORDER — DEXAMETHASONE 6 MG PO TABS
20.0000 mg | ORAL_TABLET | Freq: Once | ORAL | Status: AC
  Administered 2024-08-17: 20 mg via ORAL
  Filled 2024-08-17: qty 2

## 2024-08-17 MED ORDER — FAMOTIDINE 20 MG PO TABS
20.0000 mg | ORAL_TABLET | Freq: Once | ORAL | Status: AC
  Administered 2024-08-17: 20 mg via ORAL
  Filled 2024-08-17: qty 1

## 2024-08-17 MED ORDER — MONTELUKAST SODIUM 10 MG PO TABS
10.0000 mg | ORAL_TABLET | Freq: Once | ORAL | Status: AC
  Administered 2024-08-17: 10 mg via ORAL
  Filled 2024-08-17: qty 1

## 2024-08-17 MED ORDER — DARATUMUMAB-HYALURONIDASE-FIHJ 1800-30000 MG-UT/15ML ~~LOC~~ SOLN
1800.0000 mg | Freq: Once | SUBCUTANEOUS | Status: AC
  Administered 2024-08-17: 1800 mg via SUBCUTANEOUS
  Filled 2024-08-17: qty 15

## 2024-08-17 NOTE — Patient Instructions (Signed)
 [facility] - A DEPT OF [facility]   Discharge Instructions: Thank you for choosing [facility] to provide your oncology and hematology care.   If you have a lab appointment with the Cancer Center, please go directly to the Cancer Center and check in at the registration area.   Wear comfortable clothing and clothing appropriate for easy access to any Portacath or PICC line.   We strive to give you quality time with your provider. You may need to reschedule your appointment if you arrive late (15 or more minutes).  Arriving late affects you and other patients whose appointments are after yours.  Also, if you miss three or more appointments without notifying the office, you may be dismissed from the clinic at the provider's discretion.      For prescription refill requests, have your pharmacy contact our office and allow 72 hours for refills to be completed.    Today you received the following chemotherapy and/or immunotherapy agents: Bortezomib  (Velcade ) and daratumumab  hyaluronidase  (Darzalex  faspro)      To help prevent nausea and vomiting after your treatment, we encourage you to take your nausea medication as directed.  BELOW ARE SYMPTOMS THAT SHOULD BE REPORTED IMMEDIATELY: *FEVER GREATER THAN 100.4 F (38 C) OR HIGHER *CHILLS OR SWEATING *NAUSEA AND VOMITING THAT IS NOT CONTROLLED WITH YOUR NAUSEA MEDICATION *UNUSUAL SHORTNESS OF BREATH *UNUSUAL BRUISING OR BLEEDING *URINARY PROBLEMS (pain or burning when urinating, or frequent urination) *BOWEL PROBLEMS (unusual diarrhea, constipation, pain near the anus) TENDERNESS IN MOUTH AND THROAT WITH OR WITHOUT PRESENCE OF ULCERS (sore throat, sores in mouth, or a toothache) UNUSUAL RASH, SWELLING OR PAIN  UNUSUAL VAGINAL DISCHARGE OR ITCHING   Items with * indicate a potential emergency and should be followed up as soon as possible or go to the Emergency Department if any problems should  occur.  Please show the CHEMOTHERAPY ALERT CARD or IMMUNOTHERAPY ALERT CARD at check-in to the Emergency Department and triage nurse.  Should you have questions after your visit or need to cancel or reschedule your appointment, please contact [facility] - A DEPT OF [facility]  Dept: [phone]  and follow the prompts.  Office hours are 8:00 a.m. to 4:30 p.m. Monday - Friday. Please note that voicemails left after 4:00 p.m. may not be returned until the following business day.  We are closed weekends and major holidays. You have access to a nurse at all times for urgent questions. Please call the main number to the clinic Dept: [phone] and follow the prompts.   For any non-urgent questions, you may also contact your provider using MyChart. We now offer e-Visits for anyone 75 and older to request care online for non-urgent symptoms. For details visit [email or web address].   Also download the MyChart app! Go to the app store, search MyChart, open the app, select [place], and log in with your MyChart username and password.

## 2024-08-17 NOTE — Progress Notes (Signed)
 HEMATOLOGY ONCOLOGY PROGRESS NOTE  Date of service: 08/17/2024  Patient Care Team: Larnell Hamilton, MD as PCP - General (Internal Medicine) Lavona Agent, MD as PCP - Cardiology (Cardiology) Cindie Ole DASEN, MD as PCP - Electrophysiology (Cardiology) Danielle Rom, MD as Consulting Physician (Obstetrics and Gynecology) Key, Hargis HERO, NP as Nurse Practitioner (Gynecology) Lavona Agent, MD as Consulting Physician (Cardiology) Onesimo Emaline Brink, MD as Consulting Physician (Hematology)  CHIEF COMPLAINT/PURPOSE OF CONSULTATION: Follow-up for newly diagnosed active multiple myeloma  HISTORY OF PRESENTING ILLNESS: (08/04/2023) Mary Potter is a wonderful 78 y.o. 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   Today, she is accompanied by her daughter and son-in-law. Patient has had two vertebral compression fractures in her L-spine. She reports that her initial back fracture was from pulling too hard while adjusting her shoes, and the second occurred after pushing on a trash can lid.    Her back pain continues to be bothersome, though it has improved. She did previously have mild pain raidating to her left lower extremity, which has resolved. Back brace does improve back pain.   The first time she had a compression fracture, she was told that she was developing the beginnings of osteopenia. Patient reports that she did not tolerate Fosamax and she stopped taking it. She has not had a bone density study recently. Her last bone density study from 2018 showed osteopenia. Patient has never been on prolia  previously, though there are considerations to start it soon.    She reports having a muscle spasm on her left side, which is settling with muscle relaxants. She denies any leg swelling.    Patient reports a history of kidney stones, during which she had protein in her urine.   Patient has regularly  been on vitamin D . She has been off of calcium  for several years ago because of her fhx of heart disease.    SUMMARY OF ONCOLOGIC HISTORY: Oncology History Overview Note  MMR IHC intact MSI stable   Endometrial adenocarcinoma (HCC)  02/10/2021 Imaging   Pelvic ultrasound: thickened endometrium with slight vascularity at the fundus.  The lining measured 13.5 mm.     02/16/2021 Initial Biopsy   EMB: [facility]   03/17/2021 Surgery   Robotic-assisted laparoscopic total hysterectomy with bilateral salpingoophorectomy, SLN biopsy   Findings: On EUA, small mobile uterus. Normal upper abdominal survey on intra-abdominal entry. Normal omentum, small and large bowel. 6-8 cm uterus, normal appearing. Normal bilateral adnexa. Mapping successful to bilateral SLNs. No intra-abdominal or pelvic evidence of disease.   03/17/2021 Pathology Results   A. SENTINEL LYMPH NODE, RIGHT EXTERNAL ILIAC, EXCISION:  - One lymph node negative for metastatic carcinoma (0/1).   B. SENTINEL LYMPH NODE, LEFT OBTURATOR, EXCISION:  - One lymph node negative for metastatic carcinoma (0/1).   C. 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      Focal superficial myometrial invasion.      No lymphovascular invasion.      Cervix, bilateral ovaries and bilateral fallopian tubes negative for  carcinoma.   ONCOLOGY TABLE:  UTERUS, CARCINOMA OR CARCINOSARCOMA: Resection  Procedure: Total hysterectomy, bilateral salpingo-oophorectomy with  right and left sentinel lymph nodes.  Histologic Type: Endometrioid adenocarcinoma.  Histologic Grade: FIGO grade 1.  Myometrial Invasion:       Depth of Myometrial Invasion: 2 mm.  Myometrial Thickness (mm): 25.       Percentage of Myometrial Invasion: 8%.  Uterine Serosa Involvement: Not identified.  Cervical stromal Involvement: Not identified.  Extent of involvement of other  tissue/organs: Not identified.  Lymphovascular Invasion: Not identified.  Regional Lymph Nodes:       Pelvic Lymph Nodes Examined: 2                                      Sentinel: 2                                      Non-sentinel: 0                                      Total: 2       Lymph Nodes with Metastasis: 0  Pathologic Stage Classification (pTNM, AJCC 8th Edition): pT1a, pN0  Ancillary Studies: MMR and MSI testing have been ordered.  Representative Tumor Block: C3-C9.  Comment(s): Cytokeratin AE1/AE3 performed on the sentinel lymph nodes  (parts A and B) is negative for metastatic carcinoma.  (v4.2.0.1)    03/20/2021 Initial Diagnosis   Endometrial adenocarcinoma (HCC)   Multiple myeloma not having achieved remission (HCC)  06/10/2024 Initial Diagnosis   Multiple myeloma not having achieved remission (HCC)   06/22/2024 -  Chemotherapy   Patient is on Treatment Plan : MYELOMA NEWLY DIAGNOSED TRANSPLANT CANDIDATE DaraVRd (Daratumumab  SQ) q21d x 6 Cycles (Induction/Consolidation)      Cycle 3 / Day 15 of Daratumumab  + Velcade  + Dexamethasone   INTERVAL HISTORY:  Mary Potter is a 78 y.o. female who is here today for follow-up of recently diagnosed active multiple myeloma. accompanied by her daughter.  she was last seen by me on 08/03/2024; at the time she mentioned experiencing some pain in her left ankle, attributed to s/p fusion, occasional SOB - hx of Afib - since last treatment with fluctuating severity, and some leg swelling that improved with elevation.   Today, she says that after her last treatment she experienced nausea, but no vomiting, and diarrhea. She does note that she did have Mexican, which is what she attributes her symptoms to as this was the first time this happened.   She expresses concern about her Xgeva  as her neighbor informed her about the risk of fractures. She reports a hx of compression fractures. She is on Estradiol  transdermal patches. States that  she does use Nexium  for reflux, though her reflux is fairly tolerable without it.   REVIEW OF SYSTEMS:    10 Point review of systems of done and is negative except as noted above.  MEDICAL HISTORY Past Medical History:  Diagnosis Date   Atrial fibrillation (HCC)    Dysrhythmia    A-fib   Endometrial hyperplasia 01/03/2015   GERD (gastroesophageal reflux disease)    History of kidney stones    Hyperlipidemia    Hypertension    Pneumonia    PONV (postoperative nausea and vomiting)    slow to wake up   Thickened endometrium 01/03/2015   Vaginal delivery 1976, 1985    SURGICAL HISTORY Past Surgical History:  Procedure Laterality Date   ABDOMINAL HYSTERECTOMY     ANKLE FRACTURE SURGERY     ATRIAL  FIBRILLATION ABLATION N/A 02/08/2022   Procedure: ATRIAL FIBRILLATION ABLATION;  Surgeon: Cindie Ole DASEN, MD;  Location: [facility] IN[facility]AS[facility] CV LAB;  Service: Cardiovascular;  Laterality: N/A;   CARDIOVERSION N/A 02/17/2023   Procedure: CARDIOVERSION;  Surgeon: Sheena Pugh, DO;  Location: [facility] INVASIVE CV LAB;  Service: Cardiovascular;  Laterality: N/A;   CATARACT EXTRACTION Right 2020   DILATION AND CURETTAGE OF UTERUS  2012   ablation    EYE SURGERY     Corneal growth removal on right eye   HYSTEROSCOPY WITH D & C N/A 01/03/2015   Procedure: DILATATION AND CURETTAGE /HYSTEROSCOPY;  Surgeon: Robbi Render, MD;  Location: [facility] ORS;  Service: Gynecology;  Laterality: N/A;   LAPAROSCOPY     for endometriosis   ROBOTIC ASSISTED TOTAL HYSTERECTOMY WITH BILATERAL SALPINGO OOPHERECTOMY N/A 03/17/2021   Procedure: XI ROBOTIC ASSISTED TOTAL HYSTERECTOMY WITH BILATERAL SALPINGO OOPHORECTOMY;  Surgeon: Viktoria Comer SAUNDERS, MD;  Location: [facility] ORS;  Service: Gynecology;  Laterality: N/A;   SENTINEL NODE BIOPSY N/A 03/17/2021   Procedure: SENTINEL NODE BIOPSY;  Surgeon: Viktoria Comer SAUNDERS, MD;  Location: [facility] ORS;  Service: Gynecology;  Laterality: N/A;   TONSILLECTOMY AND ADENOIDECTOMY      SOCIAL  HISTORY Social History   Tobacco Use   Smoking status: Never    Passive exposure: Past   Smokeless tobacco: Never   Tobacco comments:    Never smoke 03/02/23  Vaping Use   Vaping status: Never Used  Substance Use Topics   Alcohol  use: No    Alcohol /week: 0.0 standard drinks of alcohol    Drug use: No    Social History   Social History Narrative   Lives with her younger daughter, Brittany. Her older daughter lives in [place] with her family.    SOCIAL DRIVERS OF HEALTH SDOH Screenings   Food Insecurity: No Food Insecurity (06/19/2024)  Housing: Low Risk  (06/19/2024)  Transportation Needs: No Transportation Needs (06/19/2024)  Utilities: Not At Risk (06/19/2024)  Depression (PHQ2-9): Low Risk  (08/03/2024)  Tobacco Use: Low Risk  (05/14/2024)     FAMILY HISTORY Family History  Problem Relation Age of Onset   Heart disease Mother 98       CABG   Stroke Father    Prostate cancer Father    Heart disease Brother 24       Transplant, died 10 years   Heart disease Maternal Grandmother    Heart disease Paternal Grandmother    Cancer Paternal Grandfather    Heart disease Brother 66       RF   Leukemia Brother    Leukemia Brother    Hypertension Sister    Cervical cancer Sister    Colon cancer Neg Hx    Breast cancer Neg Hx    Ovarian cancer Neg Hx    Endometrial cancer Neg Hx    Pancreatic cancer Neg Hx      ALLERGIES: is allergic to talwin [pentazocine], benadryl  [diphenhydramine ], hydrocodone, and prednisone .  MEDICATIONS  Current Outpatient Medications  Medication Sig Dispense Refill   acetaminophen  (TYLENOL ) 500 MG tablet Take 1,000 mg by mouth every 6 (six) hours as needed (Arthritis Pain).     acyclovir  (ZOVIRAX ) 400 MG tablet Take 1 tablet (400 mg total) by mouth 2 (two) times daily. 60 tablet 5   ALPRAZolam (XANAX) 0.5 MG tablet Take 0.25 mg by mouth daily as needed (anxiousness).     apixaban  (ELIQUIS ) 5 MG TABS tablet Take 1 tablet (5 mg total) by  mouth 2 (  two) times daily. 180 tablet 3   b complex vitamins capsule Take 1 capsule by mouth daily.     Calcium  Carb-Cholecalciferol 600-3.125 MG-MCG TABS      Cholecalciferol (VITAMIN D ) 50 MCG (2000 UT) CAPS Take 2,000 Units by mouth at bedtime.     dexamethasone  (DECADRON ) 4 MG tablet Take 5 tabs (20 mg) weekly the day after daratumumab  for 12 weeks. Take with breakfast. 20 tablet 5   esomeprazole  (NEXIUM ) 40 MG capsule Take 1 capsule (40 mg total) by mouth daily before breakfast. 30 capsule 2   estradiol  (VIVELLE -DOT) 0.025 MG/24HR PLACE 1 PATCH ONTO THE SKIN 2 (TWO) TIMES A WEEK. SUNDAYS & WEDNESDAYS 8 patch 0   metoprolol  tartrate (LOPRESSOR ) 50 MG tablet TAKE ONE TABLET BY MOUTH TWICE A DAY 180 tablet 3   olmesartan (BENICAR) 40 MG tablet Take 40 mg by mouth daily.     ondansetron  (ZOFRAN ) 8 MG tablet Take 1 tablet (8 mg total) by mouth every 8 (eight) hours as needed for nausea or vomiting. 30 tablet 1   Polyethyl Glycol-Propyl Glycol (SYSTANE OP) Place 1 drop into both eyes daily.     prochlorperazine  (COMPAZINE ) 10 MG tablet Take 1 tablet (10 mg total) by mouth every 6 (six) hours as needed for nausea or vomiting. 30 tablet 1   rosuvastatin  (CRESTOR ) 20 MG tablet Take 1 tablet (20 mg total) by mouth every evening. 90 tablet 3   No current facility-administered medications for this visit.    PHYSICAL EXAMINATION: ECOG PERFORMANCE STATUS: 1 - Symptomatic but completely ambulatory VITALS: Vitals:   08/17/24 1133  BP: 128/84  Pulse: 78  Resp: 17  Temp: 97.6 F (36.4 C)  SpO2: 98%   Filed Weights   08/17/24 1133  Weight: 164 lb (74.4 kg)   Body mass index is 29.05 kg/m.  GENERAL: alert, in no acute distress and comfortable SKIN: no acute rashes, no significant lesions EYES: conjunctiva are pink and non-injected, sclera anicteric OROPHARYNX: MMM, no exudates, no oropharyngeal erythema or ulceration NECK: supple, no JVD LYMPH:  no palpable lymphadenopathy in the cervical,  axillary or inguinal regions LUNGS: clear to auscultation b/l with normal respiratory effort HEART: regular rate & rhythm ABDOMEN:  normoactive bowel sounds , non tender, not distended, no hepatosplenomegaly Extremity: no pedal edema PSYCH: alert & oriented x 3 with fluent speech NEURO: no focal motor/sensory deficits  LABORATORY DATA:   I have reviewed the data as listed     Latest Ref Rng & Units 08/17/2024   11:10 AM 08/10/2024   12:50 PM 08/03/2024    9:35 AM  CBC EXTENDED  WBC 4.0 - 10.5 K/uL 7.6  6.7  6.7   RBC 3.87 - 5.11 MIL/uL 3.36  3.57  3.41   Hemoglobin 12.0 - 15.0 g/dL 89.9  89.1  89.6   HCT 36.0 - 46.0 % 30.6  32.9  32.0   Platelets 150 - 400 K/uL 204  213  190   NEUT# 1.7 - 7.7 K/uL 5.8  5.0  4.9   Lymph# 0.7 - 4.0 K/uL 1.1  1.0  1.1        Latest Ref Rng & Units 08/17/2024   11:05 AM 08/10/2024   12:50 PM 08/03/2024    9:35 AM  CMP  Glucose 70 - 99 mg/dL 885  895  882   BUN 8 - 23 mg/dL 17  18  16    Creatinine 0.44 - 1.00 mg/dL 9.35  9.21  9.10   Sodium 135 -  145 mmol/L 140  138  141   Potassium 3.5 - 5.1 mmol/L 3.8  4.3  4.2   Chloride 98 - 111 mmol/L 106  103  105   CO2 22 - 32 mmol/L 28  29  30    Calcium  8.9 - 10.3 mg/dL 9.5  9.7  9.6   Total Protein 6.5 - 8.1 g/dL 6.4  7.0  7.1   Total Bilirubin 0.0 - 1.2 mg/dL 0.5  0.6  0.6   Alkaline Phos 38 - 126 U/L 40  43  47   AST 15 - 41 U/L 13  15  14    ALT 0 - 44 U/L 12  13  16     MULTIPLE MYELOMA PANEL &  KAPPA/LAMBDA LIGHT CHAINS 08/2023 - 08/2024    IFE/PE, serum 07/21/2023:  05/14/2024 Surgical Pathology   Bone Marrow Report    Clinical History: MYELOMA    DIAGNOSIS:    BONE MARROW, ASPIRATE, CLOT, CORE:  - Hypercellular bone marrow (60%) involved by plasma cell neoplasm (63%  plasma cells by manual aspirate differential, approximately 70% by CD138  immunohistochemical analysis, and kappa monotypic by kappa/lambda in  situ hybridization).  See comment.    PERIPHERAL BLOOD:  - Normocytic  normochromic anemia    COMMENT:    Correlation with clinical findings, radiographic skeletal survey,  myeloma panel and other laboratory tests (SPEP, UPEP, beta  microglobulin, etc.) is recommended for a complete assessment and  classification of this case.     05/18/2024    RADIOGRAPHIC STUDIES: I have personally reviewed the radiological images as listed and agreed with the findings in the report. No results found.  10/03/2023 DG BONE DENSITY (DXA)   IMPRESSION: Referring Physician:  GLENDIA FREEMAN Your patient completed a bone mineral density test using GE Lunar iDXA system (analysis version: 16). Technologist: ALW PATIENT: Name: Tylasia, Fletchall Patient ID: [id number] Birth Date: 1946-04-14 Height: 63.0 in. Sex: Female Measured: 10/03/2023 Weight: 156.4 lbs. Indications: Advanced Age, Bilateral Ovariectomy, Caucasian, Estrogen Deficiency, Family Hist. (Parent hip fracture), Family History of Osteoporosis, History of Fracture (Adult), Hysterectomy, Nexium , Post Menopausal Fractures: Vertebra Treatments: Estrogen Patch, Vitamin D , Prolia    ASSESSMENT: The BMD measured at Left Forearm Radius 33% is 0.684 g/cm2 with a T-score of -2.2. This patient is considered to have osteopenia/low bone mass according to World Health Organization [facility]) criteria.   The scan quality is good. Lumbar Spine excluded due to degenerative changes. No FRAX due to patient taking Prolia    Site Region Measured Date Measured Age YA BMD Significant CHANGE T-score DualFemur Neck Left 10/03/2023 76.7 -2.2 0.734 g/cm2   Left Forearm Radius 33% 10/03/2023 76.7 -2.2 0.684 g/cm2   DualFemur Total Mean 10/03/2023 76.7 -1.7 0.788 g/cm2   World Health Organization [facility]) criteria for post-menopausal, Caucasian Women: Normal       T-score at or above -1 SD Osteopenia   T-score between -1 and -2.5 SD Osteoporosis T-score at or below -2.5 SD   RECOMMENDATION: 1. All patients should optimize calcium  and  vitamin D  intake. 2. Consider FDA approved medical therapies in postmenopausal women and men aged 79 years and older, based on the following: a. A hip or vertebral (clinical or morphometric) fracture b. T-score = -2.5 at the femoral neck or spine after appropriate evaluation to exclude secondary causes c. Low bone mass (T-score between -1.0 and -2.5 at the femoral neck or spine) and a 10- year probability of a hip fracture = 3% or a 10 year probability of a  major osteoporosis-related fracture = 20% based on the [place] -adapted WHO algorithm. 3. Clinician judgement and/or patient preference may indicate treatment for people with10-year fracture probabilities above or below these levels.   FOLLOW-UP: Patients with diagnosis of osteoporosis or at high risk for fracture should have regular bone mineral density tests . For patients eligible for Medicare routine testing is allowed once every 2 years. The testing frequency can be increased to one year for patients who have rapidly progressing disease, those who are receiving or discontinuing medical therapy to restore bone mass, or have additional risk factors.   I have reviewed this study and agree with the findings. [facility]  Electronically Signed   By: Norman Hopper M.D.   On: 10/03/2023 13:06   04/30/2024 NM PET Image Initial (PI) Skull Base To Thigh  CLINICAL DATA:  Initial treatment strategy for myeloma.    TECHNIQUE: 8.09 mCi F-18 FDG was injected intravenously. Full-ring PET imaging was performed from the skull base to thigh after the radiotracer. CT data was obtained and used for attenuation correction and anatomic localization.   Fasting blood glucose: 116 mg/dl   COMPARISON:  Lumbar spine CT 08/19/2023. Abdomen pelvis CT with contrast 09/12/2020. skeletal bone survey 08/15/2023.   FINDINGS: Mediastinal blood pool activity: SUV max 2.7   Liver activity: SUV max 3.1   NECK: No specific abnormal uptake  identified in the neck including along lymph node change of the submandibular, posterior triangle or internal jugular region. Near symmetric uptake of the visualized intracranial compartment.   Incidental CT findings: The parotid glands, submandibular glands and thyroid  gland are unremarkable. Significant streak artifact related to the patient's dental hardware. Paranasal sinuses and mastoid air cells are clear. Right middle turbinate concha bullosa.   CHEST: No specific abnormal uptake identified above blood pool in the axillary regions, hilum or mediastinum. No abnormal lung parenchymal uptake.   Incidental CT findings: Small hiatal hernia. Slightly patulous thoracic esophagus. No pericardial effusion. Scattered vascular calcifications are seen including along the coronary arteries. The thoracic aorta has a normal course and caliber. Scattered calcified plaque. Breathing motion seen throughout the examination with some mild scattered ground-glass identified along the lower lung zones, nonspecific. No consolidation, pneumothorax or effusion. There is some middle lobe calcified nodules identified on image 72 of the CT scan, series 4.   ABDOMEN/PELVIS: There is physiologic distribution radiotracer along the parenchymal organs, bowel and renal collecting systems. Specifically no abnormal uptake along the spleen. Splenic length approaches 10.4 cm. No abnormal uptake along lymph node chains in the abdomen and pelvis.   Incidental CT findings: On this limited noncontrast examination, the liver, adrenal glands unremarkable. There is some fatty atrophy of the pancreas. Stone in the gallbladder. No abnormal calcifications seen within either kidney nor along the course of either ureter. Underdistended urinary bladder. Large bowel has a normal course and caliber. Left-sided colonic diverticula. Scattered colonic stool. Stomach and small bowel are nondilated.   SKELETON: There is some mild  uptake seen in the spine in the area of compression deformity non additions cement at T12 through L2. There is some minimal uptake along the proximal right humeral metaphyseal shaft with maximum SUV of 3.0. No erosive changes along the bone in this location. Nonspecific. No additional areas of abnormal uptake along the visualized osseous structures.   Incidental CT findings: Diffuse degenerative changes identified.   IMPRESSION: No specific abnormal radiotracer uptake identified. This includes along lymph node chains, spleen. No splenomegaly.   Foley slight asymmetric  uptake along the proximal right humeral shaft of maximum SUV of 3.0, nonspecific. This is in a different location than the lucent lesion seen on the bone survey October 2024.   Augmentation cement seen at the spine at T12 through L2. Colonic diverticula. Gallstone. Hiatal hernia.     Electronically Signed   By: Ranell Bring M.D.   On: 05/01/2024 16:57     - Answered questions regarding Osteopenia/Osteoporosis, Osteoporotic VS Fragility fractures, and risk vs benefit of  Xgeva .   Reiterated advisory to stay up to date with dental appointments to ensure there is no need for drilling dental procedure.  Suggested low impact exercises to maintain bone density.  Try to consume adequate calcium  (1 - 1.5 g/ day) through diet, unless cannot tolerate dairy.  ASSESSMENT & PLAN:   78 y.o. 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 PET/CT 04/30/2024 nonspecific right humeral shaft uptake.  No definitive bone lesions. Bone marrow biopsy shows 70% kappa restricted plasma cells consistent with active myeloma based on the presence of more than 60% involvement with plasma cell neoplasm and also based on light chain criteria. - Molecular cytogenetics show 13 q. deletion suggesting standard risk myeloma. Pretreatment serology show IgA level of 1886 and an M spike of 1 g/dL Kappa free light chains of 265 with a  kappa lambda ratio of 106 --Started Dara/Velcade /Dex on 06/22/2024   2. History of A-fib and is on anticoagulation.  No previous history of MI or CVA or VTE.   3. History of hiatal hernia   4.  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   Multiple myeloma not having achieved remission (HCC) 06/10/2024   Counseling regarding advance care planning and goals of care 06/10/2024   Coronary artery disease involving native coronary artery of native heart without angina pectoris 03/28/2023   Hypercoagulable state due to paroxysmal atrial fibrillation (HCC) 03/02/2023   Persistent atrial fibrillation (HCC) 03/02/2023   Endometrial adenocarcinoma (HCC) 03/20/2021   Complex endometrial hyperplasia with atypia 02/18/2021   PAF (paroxysmal atrial fibrillation) (HCC) 10/15/2020   Elevated coronary artery calcium  score 09/14/2020   Essential hypertension 09/14/2020   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 06/29/2018   Greater trochanteric bursitis of left hip 04/18/2018   Acute left lumbar radiculopathy 03/21/2018   Iliotibial band tendinitis of right side 09/30/2016   Trigger finger, right middle finger 09/30/2016   Thickened endometrium 01/03/2015   Endometrial hyperplasia 01/03/2015   Tibialis posterior tendonitis 05/09/2014   H/O ankle fusion 05/09/2014   Left leg pain 04/17/2014   Lipid disorder 11/14/2012   GERD 09/11/2010   CONSTIPATION 09/11/2010   DIARRHEA 09/11/2010   PLAN: - Discussed lab results on 08/17/2024 in detail with patient: CBC showed WBC of 7.6K, Hemoglobin of 10 decreased from 10.8, and PLTs of 204K. CMP stable. 08/03/2024 Myeloma panel with M protein 0.2 and Kappa/Lambda Lights Chains have normalized. -No significant notable toxicities from her current myeloma treatment schedule with daratumumab /Velcade /dexamethasone  and she will continue the same. -Will probably plan to switch to single agent  Revlimid maintenance after maximum response with current regimen.  Patient has standard risk cytogenetics and molecular cytogenetics. - Discussed how her continued treatment depends of the extent of remaining disease, her tolerance to her treatments, and how aggressive we want to continue to treat her condition.   - Discussed Xgeva , reassuring her that the fractures related to Xgeva  usually occur after long-term use and are  unlikely to occur with correct maintenance. Additionally, her risk of osteoporotic fractures are low as well due to her HRT.  Reviewed 10/2023 Bone DEXA.  Informed her that if her reflux is tolerable without Nexium , then she can possibly reduce its usage or change medications as Nexium  contributes to decreased Vitamin-D absorption and may increase risk of fractures.   FOLLOW-UP: Plz schedule next 2 cycles of DVd per integrated scheduling  The total time spent in the appointment was 30 minutes* .  All of the patient's questions were answered and the patient knows to call the clinic with any problems, questions, or concerns.  Emaline Saran MD MS AAHIVMS [facility] Hematology/Oncology Physician [facility]  *Total Encounter Time as defined by the Centers for Medicare and Medicaid Services includes, in addition to the face-to-face time of a patient visit (documented in the note above) non-face-to-face time: obtaining and reviewing outside history, ordering and reviewing medications, tests or procedures, care coordination (communications with other health care professionals or caregivers) and documentation in the medical record.  I,Emily Lagle,acting as a neurosurgeon for Emaline Saran, MD.,have documented all relevant documentation on the behalf of Emaline Saran, MD,as directed by  Emaline Saran, MD while in the presence of Emaline Saran, MD.  I have reviewed the above documentation for accuracy and completeness, and I agree with the above.  Ogden Handlin, MD

## 2024-08-24 ENCOUNTER — Inpatient Hospital Stay

## 2024-08-24 ENCOUNTER — Encounter: Payer: Self-pay | Admitting: Hematology

## 2024-08-24 ENCOUNTER — Other Ambulatory Visit: Payer: Self-pay | Admitting: Hematology

## 2024-08-24 VITALS — BP 142/89 | HR 79 | Temp 97.7°F | Resp 16 | Wt 162.5 lb

## 2024-08-24 DIAGNOSIS — C9000 Multiple myeloma not having achieved remission: Secondary | ICD-10-CM | POA: Diagnosis not present

## 2024-08-24 DIAGNOSIS — M810 Age-related osteoporosis without current pathological fracture: Secondary | ICD-10-CM | POA: Diagnosis not present

## 2024-08-24 DIAGNOSIS — Z7901 Long term (current) use of anticoagulants: Secondary | ICD-10-CM | POA: Diagnosis not present

## 2024-08-24 DIAGNOSIS — Z5111 Encounter for antineoplastic chemotherapy: Secondary | ICD-10-CM | POA: Diagnosis not present

## 2024-08-24 DIAGNOSIS — Z7189 Other specified counseling: Secondary | ICD-10-CM

## 2024-08-24 DIAGNOSIS — Z79899 Other long term (current) drug therapy: Secondary | ICD-10-CM | POA: Diagnosis not present

## 2024-08-24 DIAGNOSIS — I4891 Unspecified atrial fibrillation: Secondary | ICD-10-CM | POA: Diagnosis not present

## 2024-08-24 LAB — CBC WITH DIFFERENTIAL (CANCER CENTER ONLY)
Abs Immature Granulocytes: 0.01 K/uL (ref 0.00–0.07)
Basophils Absolute: 0 K/uL (ref 0.0–0.1)
Basophils Relative: 0 %
Eosinophils Absolute: 0.2 K/uL (ref 0.0–0.5)
Eosinophils Relative: 2 %
HCT: 33.6 % — ABNORMAL LOW (ref 36.0–46.0)
Hemoglobin: 10.8 g/dL — ABNORMAL LOW (ref 12.0–15.0)
Immature Granulocytes: 0 %
Lymphocytes Relative: 13 %
Lymphs Abs: 0.9 K/uL (ref 0.7–4.0)
MCH: 29.9 pg (ref 26.0–34.0)
MCHC: 32.1 g/dL (ref 30.0–36.0)
MCV: 93.1 fL (ref 80.0–100.0)
Monocytes Absolute: 0.4 K/uL (ref 0.1–1.0)
Monocytes Relative: 6 %
Neutro Abs: 5.8 K/uL (ref 1.7–7.7)
Neutrophils Relative %: 79 %
Platelet Count: 193 K/uL (ref 150–400)
RBC: 3.61 MIL/uL — ABNORMAL LOW (ref 3.87–5.11)
RDW: 14.4 % (ref 11.5–15.5)
WBC Count: 7.4 K/uL (ref 4.0–10.5)
nRBC: 0 % (ref 0.0–0.2)

## 2024-08-24 LAB — CMP (CANCER CENTER ONLY)
ALT: 14 U/L (ref 0–44)
AST: 16 U/L (ref 15–41)
Albumin: 4.5 g/dL (ref 3.5–5.0)
Alkaline Phosphatase: 45 U/L (ref 38–126)
Anion gap: 5 (ref 5–15)
BUN: 20 mg/dL (ref 8–23)
CO2: 30 mmol/L (ref 22–32)
Calcium: 9.5 mg/dL (ref 8.9–10.3)
Chloride: 104 mmol/L (ref 98–111)
Creatinine: 0.92 mg/dL (ref 0.44–1.00)
GFR, Estimated: >60 mL/min (ref >60)
Glucose, Bld: 98 mg/dL (ref 70–99)
Potassium: 4.2 mmol/L (ref 3.5–5.1)
Sodium: 139 mmol/L (ref 135–145)
Total Bilirubin: 0.6 mg/dL (ref 0.0–1.2)
Total Protein: 7 g/dL (ref 6.5–8.1)

## 2024-08-24 MED ORDER — ACETAMINOPHEN 325 MG PO TABS
650.0000 mg | ORAL_TABLET | Freq: Once | ORAL | Status: AC
  Administered 2024-08-24: 650 mg via ORAL
  Filled 2024-08-24: qty 2

## 2024-08-24 MED ORDER — DARATUMUMAB-HYALURONIDASE-FIHJ 1800-30000 MG-UT/15ML ~~LOC~~ SOLN
1800.0000 mg | Freq: Once | SUBCUTANEOUS | Status: AC
  Administered 2024-08-24: 1800 mg via SUBCUTANEOUS
  Filled 2024-08-24: qty 15

## 2024-08-24 MED ORDER — BORTEZOMIB CHEMO SQ INJECTION 3.5 MG (2.5MG/ML)
1.3000 mg/m2 | Freq: Once | INTRAMUSCULAR | Status: AC
  Administered 2024-08-24: 2.25 mg via SUBCUTANEOUS
  Filled 2024-08-24: qty 0.9

## 2024-08-24 MED ORDER — FAMOTIDINE 20 MG PO TABS
20.0000 mg | ORAL_TABLET | Freq: Once | ORAL | Status: AC
  Administered 2024-08-24: 20 mg via ORAL
  Filled 2024-08-24: qty 1

## 2024-08-24 MED ORDER — MONTELUKAST SODIUM 10 MG PO TABS
10.0000 mg | ORAL_TABLET | Freq: Once | ORAL | Status: AC
  Administered 2024-08-24: 10 mg via ORAL
  Filled 2024-08-24: qty 1

## 2024-08-24 MED ORDER — DIPHENHYDRAMINE HCL 25 MG PO CAPS
50.0000 mg | ORAL_CAPSULE | Freq: Once | ORAL | Status: AC
  Administered 2024-08-24: 50 mg via ORAL
  Filled 2024-08-24: qty 2

## 2024-08-24 MED ORDER — DEXAMETHASONE 6 MG PO TABS
20.0000 mg | ORAL_TABLET | Freq: Once | ORAL | Status: AC
  Administered 2024-08-24: 20 mg via ORAL
  Filled 2024-08-24: qty 2

## 2024-08-24 NOTE — Patient Instructions (Signed)
 [facility] - A DEPT OF [facility]   Discharge Instructions: Thank you for choosing [facility] to provide your oncology and hematology care.   If you have a lab appointment with the Cancer Center, please go directly to the Cancer Center and check in at the registration area.   Wear comfortable clothing and clothing appropriate for easy access to any Portacath or PICC line.   We strive to give you quality time with your provider. You may need to reschedule your appointment if you arrive late (15 or more minutes).  Arriving late affects you and other patients whose appointments are after yours.  Also, if you miss three or more appointments without notifying the office, you may be dismissed from the clinic at the provider's discretion.      For prescription refill requests, have your pharmacy contact our office and allow 72 hours for refills to be completed.    Today you received the following chemotherapy and/or immunotherapy agents: Bortezomib  (Velcade ) and daratumumab  hyaluronidase  (Darzalex  faspro)      To help prevent nausea and vomiting after your treatment, we encourage you to take your nausea medication as directed.  BELOW ARE SYMPTOMS THAT SHOULD BE REPORTED IMMEDIATELY: *FEVER GREATER THAN 100.4 F (38 C) OR HIGHER *CHILLS OR SWEATING *NAUSEA AND VOMITING THAT IS NOT CONTROLLED WITH YOUR NAUSEA MEDICATION *UNUSUAL SHORTNESS OF BREATH *UNUSUAL BRUISING OR BLEEDING *URINARY PROBLEMS (pain or burning when urinating, or frequent urination) *BOWEL PROBLEMS (unusual diarrhea, constipation, pain near the anus) TENDERNESS IN MOUTH AND THROAT WITH OR WITHOUT PRESENCE OF ULCERS (sore throat, sores in mouth, or a toothache) UNUSUAL RASH, SWELLING OR PAIN  UNUSUAL VAGINAL DISCHARGE OR ITCHING   Items with * indicate a potential emergency and should be followed up as soon as possible or go to the Emergency Department if any problems should  occur.  Please show the CHEMOTHERAPY ALERT CARD or IMMUNOTHERAPY ALERT CARD at check-in to the Emergency Department and triage nurse.  Should you have questions after your visit or need to cancel or reschedule your appointment, please contact [facility] - A DEPT OF [facility]  Dept: [phone]  and follow the prompts.  Office hours are 8:00 a.m. to 4:30 p.m. Monday - Friday. Please note that voicemails left after 4:00 p.m. may not be returned until the following business day.  We are closed weekends and major holidays. You have access to a nurse at all times for urgent questions. Please call the main number to the clinic Dept: [phone] and follow the prompts.   For any non-urgent questions, you may also contact your provider using MyChart. We now offer e-Visits for anyone 52 and older to request care online for non-urgent symptoms. For details visit [email or web address].   Also download the MyChart app! Go to the app store, search MyChart, open the app, select [place], and log in with your MyChart username and password.

## 2024-08-25 ENCOUNTER — Encounter: Payer: Self-pay | Admitting: Hematology

## 2024-08-27 LAB — KAPPA/LAMBDA LIGHT CHAINS
Kappa free light chain: 4.1 mg/L (ref 3.3–19.4)
Kappa, lambda light chain ratio: 2.41 — ABNORMAL HIGH (ref 0.26–1.65)
Lambda free light chains: 1.7 mg/L — ABNORMAL LOW (ref 5.7–26.3)

## 2024-08-29 LAB — MULTIPLE MYELOMA PANEL, SERUM
Albumin SerPl Elph-Mcnc: 3.9 g/dL (ref 2.9–4.4)
Albumin/Glob SerPl: 1.6 (ref 0.7–1.7)
Alpha 1: 0.2 g/dL (ref 0.0–0.4)
Alpha2 Glob SerPl Elph-Mcnc: 0.9 g/dL (ref 0.4–1.0)
B-Globulin SerPl Elph-Mcnc: 1 g/dL (ref 0.7–1.3)
Gamma Glob SerPl Elph-Mcnc: 0.4 g/dL (ref 0.4–1.8)
Globulin, Total: 2.6 g/dL (ref 2.2–3.9)
IgA: 25 mg/dL — ABNORMAL LOW (ref 64–422)
IgG (Immunoglobin G), Serum: 590 mg/dL (ref 586–1602)
IgM (Immunoglobulin M), Srm: 17 mg/dL — ABNORMAL LOW (ref 26–217)
M Protein SerPl Elph-Mcnc: 0.2 g/dL — ABNORMAL HIGH
Total Protein ELP: 6.5 g/dL (ref 6.0–8.5)

## 2024-08-31 ENCOUNTER — Inpatient Hospital Stay

## 2024-08-31 ENCOUNTER — Inpatient Hospital Stay (HOSPITAL_BASED_OUTPATIENT_CLINIC_OR_DEPARTMENT_OTHER): Admitting: Hematology

## 2024-08-31 VITALS — BP 149/68 | HR 82 | Temp 97.2°F | Resp 20 | Wt 161.6 lb

## 2024-08-31 DIAGNOSIS — C9000 Multiple myeloma not having achieved remission: Secondary | ICD-10-CM

## 2024-08-31 DIAGNOSIS — Z7189 Other specified counseling: Secondary | ICD-10-CM

## 2024-08-31 DIAGNOSIS — I4891 Unspecified atrial fibrillation: Secondary | ICD-10-CM | POA: Diagnosis not present

## 2024-08-31 DIAGNOSIS — M810 Age-related osteoporosis without current pathological fracture: Secondary | ICD-10-CM | POA: Diagnosis not present

## 2024-08-31 DIAGNOSIS — Z5112 Encounter for antineoplastic immunotherapy: Secondary | ICD-10-CM | POA: Diagnosis not present

## 2024-08-31 DIAGNOSIS — Z7901 Long term (current) use of anticoagulants: Secondary | ICD-10-CM | POA: Diagnosis not present

## 2024-08-31 DIAGNOSIS — Z79899 Other long term (current) drug therapy: Secondary | ICD-10-CM | POA: Diagnosis not present

## 2024-08-31 DIAGNOSIS — Z5111 Encounter for antineoplastic chemotherapy: Secondary | ICD-10-CM | POA: Diagnosis not present

## 2024-08-31 LAB — CBC WITH DIFFERENTIAL (CANCER CENTER ONLY)
Abs Immature Granulocytes: 0.01 K/uL (ref 0.00–0.07)
Basophils Absolute: 0 K/uL (ref 0.0–0.1)
Basophils Relative: 0 %
Eosinophils Absolute: 0.1 K/uL (ref 0.0–0.5)
Eosinophils Relative: 2 %
HCT: 34 % — ABNORMAL LOW (ref 36.0–46.0)
Hemoglobin: 11.1 g/dL — ABNORMAL LOW (ref 12.0–15.0)
Immature Granulocytes: 0 %
Lymphocytes Relative: 14 %
Lymphs Abs: 0.9 K/uL (ref 0.7–4.0)
MCH: 29.9 pg (ref 26.0–34.0)
MCHC: 32.6 g/dL (ref 30.0–36.0)
MCV: 91.6 fL (ref 80.0–100.0)
Monocytes Absolute: 0.4 K/uL (ref 0.1–1.0)
Monocytes Relative: 6 %
Neutro Abs: 4.9 K/uL (ref 1.7–7.7)
Neutrophils Relative %: 78 %
Platelet Count: 202 K/uL (ref 150–400)
RBC: 3.71 MIL/uL — ABNORMAL LOW (ref 3.87–5.11)
RDW: 14.4 % (ref 11.5–15.5)
WBC Count: 6.3 K/uL (ref 4.0–10.5)
nRBC: 0 % (ref 0.0–0.2)

## 2024-08-31 LAB — CMP (CANCER CENTER ONLY)
ALT: 14 U/L (ref 0–44)
AST: 15 U/L (ref 15–41)
Albumin: 4.3 g/dL (ref 3.5–5.0)
Alkaline Phosphatase: 43 U/L (ref 38–126)
Anion gap: 5 (ref 5–15)
BUN: 25 mg/dL — ABNORMAL HIGH (ref 8–23)
CO2: 30 mmol/L (ref 22–32)
Calcium: 9.7 mg/dL (ref 8.9–10.3)
Chloride: 105 mmol/L (ref 98–111)
Creatinine: 0.81 mg/dL (ref 0.44–1.00)
GFR, Estimated: >60 mL/min (ref >60)
Glucose, Bld: 107 mg/dL — ABNORMAL HIGH (ref 70–99)
Potassium: 4.2 mmol/L (ref 3.5–5.1)
Sodium: 140 mmol/L (ref 135–145)
Total Bilirubin: 0.5 mg/dL (ref 0.0–1.2)
Total Protein: 6.8 g/dL (ref 6.5–8.1)

## 2024-08-31 MED ORDER — DARATUMUMAB-HYALURONIDASE-FIHJ 1800-30000 MG-UT/15ML ~~LOC~~ SOLN
1800.0000 mg | Freq: Once | SUBCUTANEOUS | Status: AC
  Administered 2024-08-31: 1800 mg via SUBCUTANEOUS
  Filled 2024-08-31: qty 15

## 2024-08-31 MED ORDER — ACETAMINOPHEN 325 MG PO TABS
650.0000 mg | ORAL_TABLET | Freq: Once | ORAL | Status: AC
  Administered 2024-08-31: 650 mg via ORAL
  Filled 2024-08-31: qty 2

## 2024-08-31 MED ORDER — DEXAMETHASONE 6 MG PO TABS
20.0000 mg | ORAL_TABLET | Freq: Once | ORAL | Status: AC
  Administered 2024-08-31: 20 mg via ORAL
  Filled 2024-08-31: qty 2

## 2024-08-31 MED ORDER — FAMOTIDINE 20 MG PO TABS
20.0000 mg | ORAL_TABLET | Freq: Once | ORAL | Status: AC
  Administered 2024-08-31: 20 mg via ORAL
  Filled 2024-08-31: qty 1

## 2024-08-31 MED ORDER — MONTELUKAST SODIUM 10 MG PO TABS
10.0000 mg | ORAL_TABLET | Freq: Once | ORAL | Status: AC
  Administered 2024-08-31: 10 mg via ORAL
  Filled 2024-08-31: qty 1

## 2024-08-31 MED ORDER — DIPHENHYDRAMINE HCL 25 MG PO CAPS
50.0000 mg | ORAL_CAPSULE | Freq: Once | ORAL | Status: AC
  Administered 2024-08-31: 50 mg via ORAL
  Filled 2024-08-31: qty 2

## 2024-08-31 MED ORDER — BORTEZOMIB CHEMO SQ INJECTION 3.5 MG (2.5MG/ML)
1.3000 mg/m2 | Freq: Once | INTRAMUSCULAR | Status: AC
  Administered 2024-08-31: 2.25 mg via SUBCUTANEOUS
  Filled 2024-08-31: qty 0.9

## 2024-08-31 NOTE — Progress Notes (Signed)
 HEMATOLOGY ONCOLOGY PROGRESS NOTE  Date of service: 08/31/2024  Patient Care Team: Larnell Hamilton, MD as PCP - General (Internal Medicine) Lavona Agent, MD as PCP - Cardiology (Cardiology) Cindie Ole DASEN, MD as PCP - Electrophysiology (Cardiology) Danielle Rom, MD as Consulting Physician (Obstetrics and Gynecology) Key, Hargis HERO, NP as Nurse Practitioner (Gynecology) Lavona Agent, MD as Consulting Physician (Cardiology) Onesimo Emaline Brink, MD as Consulting Physician (Hematology)  CHIEF COMPLAINT/PURPOSE OF CONSULTATION: Follow-up for continued evaluation and management of  newly diagnosed active multiple myeloma   HISTORY OF PRESENTING ILLNESS: (08/04/2023) Mary Potter is a wonderful 78 y.o. 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   Today, she is accompanied by her daughter and son-in-law. Patient has had two vertebral compression fractures in her L-spine. She reports that her initial back fracture was from pulling too hard while adjusting her shoes, and the second occurred after pushing on a trash can lid.    Her back pain continues to be bothersome, though it has improved. She did previously have mild pain raidating to her left lower extremity, which has resolved. Back brace does improve back pain.   The first time she had a compression fracture, she was told that she was developing the beginnings of osteopenia. Patient reports that she did not tolerate Fosamax and she stopped taking it. She has not had a bone density study recently. Her last bone density study from 2018 showed osteopenia. Patient has never been on prolia  previously, though there are considerations to start it soon.    She reports having a muscle spasm on her left side, which is settling with muscle relaxants. She denies any leg swelling.    Patient reports a history of kidney stones, during which she had  protein in her urine.   Patient has regularly been on vitamin D . She has been off of calcium  for several years ago because of her fhx of heart disease.    SUMMARY OF ONCOLOGIC HISTORY: Oncology History Overview Note  MMR IHC intact MSI stable   Endometrial adenocarcinoma (HCC)  02/10/2021 Imaging   Pelvic ultrasound: thickened endometrium with slight vascularity at the fundus.  The lining measured 13.5 mm.     02/16/2021 Initial Biopsy   EMB: [facility]   03/17/2021 Surgery   Robotic-assisted laparoscopic total hysterectomy with bilateral salpingoophorectomy, SLN biopsy   Findings: On EUA, small mobile uterus. Normal upper abdominal survey on intra-abdominal entry. Normal omentum, small and large bowel. 6-8 cm uterus, normal appearing. Normal bilateral adnexa. Mapping successful to bilateral SLNs. No intra-abdominal or pelvic evidence of disease.   03/17/2021 Pathology Results   A. SENTINEL LYMPH NODE, RIGHT EXTERNAL ILIAC, EXCISION:  - One lymph node negative for metastatic carcinoma (0/1).   B. SENTINEL LYMPH NODE, LEFT OBTURATOR, EXCISION:  - One lymph node negative for metastatic carcinoma (0/1).   C. 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      Focal superficial myometrial invasion.      No lymphovascular invasion.      Cervix, bilateral ovaries and bilateral fallopian tubes negative for  carcinoma.   ONCOLOGY TABLE:  UTERUS, CARCINOMA OR CARCINOSARCOMA: Resection  Procedure: Total hysterectomy, bilateral salpingo-oophorectomy with  right and left sentinel lymph nodes.  Histologic Type: Endometrioid adenocarcinoma.  Histologic Grade: FIGO grade 1.  Myometrial Invasion:       Depth  of Myometrial Invasion: 2 mm.       Myometrial Thickness (mm): 25.       Percentage of Myometrial Invasion: 8%.  Uterine Serosa Involvement: Not identified.  Cervical stromal Involvement:  Not identified.  Extent of involvement of other tissue/organs: Not identified.  Lymphovascular Invasion: Not identified.  Regional Lymph Nodes:       Pelvic Lymph Nodes Examined: 2                                      Sentinel: 2                                      Non-sentinel: 0                                      Total: 2       Lymph Nodes with Metastasis: 0  Pathologic Stage Classification (pTNM, AJCC 8th Edition): pT1a, pN0  Ancillary Studies: MMR and MSI testing have been ordered.  Representative Tumor Block: C3-C9.  Comment(s): Cytokeratin AE1/AE3 performed on the sentinel lymph nodes  (parts A and B) is negative for metastatic carcinoma.  (v4.2.0.1)    03/20/2021 Initial Diagnosis   Endometrial adenocarcinoma (HCC)   Multiple myeloma not having achieved remission (HCC)  06/10/2024 Initial Diagnosis   Multiple myeloma not having achieved remission (HCC)   06/22/2024 -  Chemotherapy   Patient is on Treatment Plan : MYELOMA NEWLY DIAGNOSED TRANSPLANT CANDIDATE DaraVRd (Daratumumab  SQ) q21d x 6 Cycles (Induction/Consolidation)      Cycle 4 / Day 8 of Patient is on Treatment Plan :  MYELOMA NEWLY DIAGNOSED TRANSPLANT CANDIDATE DaraVRd (Daratumumab  SQ) q21d x 6 Cycles (Induction/Consolidation)   INTERVAL HISTORY:  Mary Potter is a 78 y.o. female who is here today for continued evaluation and management of active multiple myeloma. accompanied by husband.   she was last seen by me on 08/17/2024; at the time she expressed concern regarding her Xgeva  and ONJ and this was discussed with her in details, and mentioned experiencing nausea, no vomiting  Today, she says that she does not have any new concerns. I reviewed with her labs from today and the treatment options. She is on Eliquis  twice daily for A fib.  No notable toxicities from her current treatment.  She says that she plans to update her Influenza vaccination soon.   REVIEW OF SYSTEMS:   10 Point review of systems  of done and is negative except as noted above.  MEDICAL HISTORY Past Medical History:  Diagnosis Date   Atrial fibrillation (HCC)    Dysrhythmia    A-fib   Endometrial hyperplasia 01/03/2015   GERD (gastroesophageal reflux disease)    History of kidney stones    Hyperlipidemia    Hypertension    Pneumonia    PONV (postoperative nausea and vomiting)    slow to wake up   Thickened endometrium 01/03/2015   Vaginal delivery 1976, 1985    SURGICAL HISTORY Past Surgical History:  Procedure Laterality Date   ABDOMINAL HYSTERECTOMY     ANKLE FRACTURE SURGERY     ATRIAL FIBRILLATION ABLATION N/A 02/08/2022   Procedure: ATRIAL FIBRILLATION ABLATION;  Surgeon: Cindie Ole DASEN, MD;  Location: [facility];  Service: Cardiovascular;  Laterality: N/A;   CARDIOVERSION N/A 02/17/2023   Procedure: CARDIOVERSION;  Surgeon: Sheena Pugh, DO;  Location: [facility];  Service: Cardiovascular;  Laterality: N/A;   CATARACT EXTRACTION Right 2020   DILATION AND CURETTAGE OF UTERUS  2012   ablation    EYE SURGERY     Corneal growth removal on right eye   HYSTEROSCOPY WITH D & C N/A 01/03/2015   Procedure: DILATATION AND CURETTAGE /HYSTEROSCOPY;  Surgeon: Robbi Render, MD;  Location: [facility];  Service: Gynecology;  Laterality: N/A;   LAPAROSCOPY     for endometriosis   ROBOTIC ASSISTED TOTAL HYSTERECTOMY WITH BILATERAL SALPINGO OOPHERECTOMY N/A 03/17/2021   Procedure: XI ROBOTIC ASSISTED TOTAL HYSTERECTOMY WITH BILATERAL SALPINGO OOPHORECTOMY;  Surgeon: Viktoria Comer SAUNDERS, MD;  Location: [facility];  Service: Gynecology;  Laterality: N/A;   SENTINEL NODE BIOPSY N/A 03/17/2021   Procedure: SENTINEL NODE BIOPSY;  Surgeon: Viktoria Comer SAUNDERS, MD;  Location: [facility];  Service: Gynecology;  Laterality: N/A;   TONSILLECTOMY AND ADENOIDECTOMY      SOCIAL HISTORY Social History   Tobacco Use   Smoking status: Never    Passive exposure: Past   Smokeless tobacco: Never   Tobacco comments:     Never smoke 03/02/23  Vaping Use   Vaping status: Never Used  Substance Use Topics   Alcohol  use: No    Alcohol /week: 0.0 standard drinks of alcohol    Drug use: No    Social History   Social History Narrative   Lives with her younger daughter, Brittany. Her older daughter lives in [place] with her family.    SOCIAL DRIVERS OF HEALTH SDOH Screenings   Food Insecurity: No Food Insecurity (06/19/2024)  Housing: Low Risk  (06/19/2024)  Transportation Needs: No Transportation Needs (06/19/2024)  Utilities: Not At Risk (06/19/2024)  Depression (PHQ2-9): Low Risk  (08/31/2024)  Tobacco Use: Low Risk  (05/14/2024)     FAMILY HISTORY Family History  Problem Relation Age of Onset   Heart disease Mother 89       CABG   Stroke Father    Prostate cancer Father    Heart disease Brother 74       Transplant, died 10 years   Heart disease Maternal Grandmother    Heart disease Paternal Grandmother    Cancer Paternal Grandfather    Heart disease Brother 44       RF   Leukemia Brother    Leukemia Brother    Hypertension Sister    Cervical cancer Sister    Colon cancer Neg Hx    Breast cancer Neg Hx    Ovarian cancer Neg Hx    Endometrial cancer Neg Hx    Pancreatic cancer Neg Hx      ALLERGIES: is allergic to talwin [pentazocine], benadryl  [diphenhydramine ], hydrocodone, and prednisone .  MEDICATIONS  Current Outpatient Medications  Medication Sig Dispense Refill   acetaminophen  (TYLENOL ) 500 MG tablet Take 1,000 mg by mouth every 6 (six) hours as needed (Arthritis Pain).     acyclovir  (ZOVIRAX ) 400 MG tablet Take 1 tablet (400 mg total) by mouth 2 (two) times daily. 60 tablet 5   ALPRAZolam (XANAX) 0.5 MG tablet Take 0.25 mg by mouth daily as needed (anxiousness).     apixaban  (ELIQUIS ) 5 MG TABS tablet Take 1 tablet (5 mg total) by mouth 2 (two) times daily. 180 tablet 3   b complex vitamins capsule Take 1 capsule by mouth daily.     Calcium  Carb-Cholecalciferol  600-3.125  MG-MCG TABS      Cholecalciferol (VITAMIN D ) 50 MCG (2000 UT) CAPS Take 2,000 Units by mouth at bedtime.     dexamethasone  (DECADRON ) 4 MG tablet Take 5 tabs (20 mg) weekly the day after daratumumab  for 12 weeks. Take with breakfast. 20 tablet 5   esomeprazole  (NEXIUM ) 40 MG capsule Take 1 capsule (40 mg total) by mouth daily before breakfast. 30 capsule 2   estradiol  (VIVELLE -DOT) 0.025 MG/24HR PLACE 1 PATCH ONTO THE SKIN 2 (TWO) TIMES A WEEK. SUNDAYS & WEDNESDAYS 8 patch 0   metoprolol  tartrate (LOPRESSOR ) 50 MG tablet TAKE ONE TABLET BY MOUTH TWICE A DAY 180 tablet 3   olmesartan (BENICAR) 40 MG tablet Take 40 mg by mouth daily.     ondansetron  (ZOFRAN ) 8 MG tablet Take 1 tablet (8 mg total) by mouth every 8 (eight) hours as needed for nausea or vomiting. 30 tablet 1   Polyethyl Glycol-Propyl Glycol (SYSTANE OP) Place 1 drop into both eyes daily.     prochlorperazine  (COMPAZINE ) 10 MG tablet Take 1 tablet (10 mg total) by mouth every 6 (six) hours as needed for nausea or vomiting. 30 tablet 1   rosuvastatin  (CRESTOR ) 20 MG tablet Take 1 tablet (20 mg total) by mouth every evening. 90 tablet 3   No current facility-administered medications for this visit.    PHYSICAL EXAMINATION: ECOG PERFORMANCE STATUS: 1 - Symptomatic but completely ambulatory VITALS: Vitals:   08/31/24 1255 08/31/24 1305  BP: (!) 153/80 (!) 149/68  Pulse: 82   Resp: 20   Temp: (!) 97.2 F (36.2 C)   SpO2: 98%    Filed Weights   08/31/24 1255  Weight: 161 lb 9.6 oz (73.3 kg)   Body mass index is 28.63 kg/m.  GENERAL: alert, in no acute distress and comfortable SKIN: no acute rashes, no significant lesions EYES: conjunctiva are pink and non-injected, sclera anicteric OROPHARYNX: MMM, no exudates, no oropharyngeal erythema or ulceration NECK: supple, no JVD LYMPH:  no palpable lymphadenopathy in the cervical, axillary or inguinal regions LUNGS: clear to auscultation b/l with normal respiratory effort HEART:  regular rate & rhythm ABDOMEN:  normoactive bowel sounds , non tender, not distended, no hepatosplenomegaly Extremity: no pedal edema PSYCH: alert & oriented x 3 with fluent speech NEURO: no focal motor/sensory deficits  LABORATORY DATA:   I have reviewed the data as listed     Latest Ref Rng & Units  08/31/2024   12:51 PM 08/24/2024    2:32 PM  CBC EXTENDED  WBC 4.0 - 10.5 K/uL  6.3  7.4   RBC 3.87 - 5.11 MIL/uL  3.71  3.61   Hemoglobin 12.0 - 15.0 g/dL  88.8  89.1   HCT 63.9 - 46.0 %  34.0  33.6   Platelets 150 - 400 K/uL  202  193   NEUT# 1.7 - 7.7 K/uL  4.9  5.8   Lymph# 0.7 - 4.0 K/uL  0.9  0.9       Latest Ref Rng & Units  08/31/2024   12:51 PM 08/24/2024    2:32 PM  CMP  Glucose 70 - 99 mg/dL  892  98   BUN 8 - 23 mg/dL  25  20   Creatinine 9.55 - 1.00 mg/dL  9.18  9.07   Sodium 864 - 145 mmol/L  140  139   Potassium 3.5 - 5.1 mmol/L  4.2  4.2   Chloride 98 - 111 mmol/L  105  104  CO2 22 - 32 mmol/L  30  30   Calcium  8.9 - 10.3 mg/dL  9.7  9.5   Total Protein 6.5 - 8.1 g/dL  6.8  7.0   Total Bilirubin 0.0 - 1.2 mg/dL  0.5  0.6   Alkaline Phos 38 - 126 U/L  43  45   AST 15 - 41 U/L  15  16   ALT 0 - 44 U/L  14  14    MULTIPLE MYELOMA & KAPPA/LAMBDA LIGHT CHAINS   Immunofixation shows IgG monoclonal protein with kappa light chain  specificity.    IFE/PE, serum 07/21/2023:  05/14/2024 Surgical Pathology   Bone Marrow Report    Clinical History: MYELOMA    DIAGNOSIS:    BONE MARROW, ASPIRATE, CLOT, CORE:  - Hypercellular bone marrow (60%) involved by plasma cell neoplasm (63%  plasma cells by manual aspirate differential, approximately 70% by CD138  immunohistochemical analysis, and kappa monotypic by kappa/lambda in  situ hybridization).  See comment.    PERIPHERAL BLOOD:  - Normocytic normochromic anemia    COMMENT:    Correlation with clinical findings, radiographic skeletal survey,  myeloma panel and other laboratory tests (SPEP, UPEP, beta   microglobulin, etc.) is recommended for a complete assessment and  classification of this case.     05/18/2024      RADIOGRAPHIC STUDIES: I have personally reviewed the radiological images as listed and agreed with the findings in the report. No results found.   ASSESSMENT & PLAN: \  78 y.o. female with  IgA kappa active multiple myeloma. PET/CT 04/30/2024 nonspecific right humeral shaft uptake.  No definitive bone lesions. Bone marrow biopsy shows 70% kappa restricted plasma cells consistent with active myeloma based on the presence of more than 60% involvement with plasma cell neoplasm and also based on light chain criteria. - Molecular cytogenetics show 13 q. deletion suggesting standard risk myeloma. Pretreatment serology show IgA level of 1886 and an M spike of 1 g/dL Kappa free light chains of 265 with a kappa lambda ratio of 106 --Started Dara/Velcade /Dex on 06/22/2024   2. History of A-fib and is on anticoagulation.  No previous history of MI or CVA or VTE.   3. History of hiatal hernia   4.  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   Multiple myeloma not having achieved remission (HCC) 06/10/2024   Counseling regarding advance care planning and goals of care 06/10/2024   Coronary artery disease involving native coronary artery of native heart without angina pectoris 03/28/2023   Hypercoagulable state due to paroxysmal atrial fibrillation (HCC) 03/02/2023   Persistent atrial fibrillation (HCC) 03/02/2023   Endometrial adenocarcinoma (HCC) 03/20/2021   Complex endometrial hyperplasia with atypia 02/18/2021   PAF (paroxysmal atrial fibrillation) (HCC) 10/15/2020   Elevated coronary artery calcium  score 09/14/2020   Essential hypertension 09/14/2020   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 06/29/2018   Greater trochanteric bursitis of left hip 04/18/2018   Acute left lumbar radiculopathy 03/21/2018    Iliotibial band tendinitis of right side 09/30/2016   Trigger finger, right middle finger 09/30/2016   Thickened endometrium 01/03/2015   Endometrial hyperplasia 01/03/2015   Tibialis posterior tendonitis 05/09/2014   H/O ankle fusion 05/09/2014   Left leg pain 04/17/2014   Lipid disorder 11/14/2012   GERD 09/11/2010   CONSTIPATION 09/11/2010   DIARRHEA 09/11/2010   PLAN: - Discussed lab results on 08/31/2024 in detail with patient: CBC showed RBC of  3.65K decreased from 3.71K, Hemoglobin of 10.8 decreased from 11.1, and PLTs of 188K decreased from 202K CMP stable. M protein 0.2, no change from prior two panels. IFE: Immunofixation shows IgG monoclonal protein with kappa light chain specificity.    This could be due to Dara.    Discussed repeating PET and BMBX - das discussion -- decided to hold off on these.  - Extensive discussion regarding possible treatments.   Difference between 1 drug treatment vs 2 drug maintenance treatment,  She could be placed on maintenance therapy based off previous tx, what she tolerated, and other comorbidities. One of the most common maintenance is Revlimid 3 weeks on 1 off. With this she is at a increased risk of clots which is significantly mitigated by being on an anticoagulant.  Another option would be Velcade  - tolerated usually well, less risk of neuropathy. 3 weeks on, 1 week off. -will complete C5 of DVD with same supportive medications and is response has plateaued can switch to maintenance treatment. - Discussed immunocompromised status in regards to crowds as she plans on having family around for the holidays There is some risk still, but advised her to be reasonably cautious.   FOLLOW-UP iContinue DVD per integrated scheduling MD visit in 3 weeks  The total time spent in the appointment was 30 minutes* .  All of the patient's questions were answered and the patient knows to call the clinic with any problems, questions, or  concerns.  Emaline Saran MD MS AAHIVMS [facility] Hematology/Oncology Physician [facility]  *Total Encounter Time as defined by the Centers for Medicare and Medicaid Services includes, in addition to theface-to-face time of a patient visit (documented in the note above) non-face-to-face time: obtaining and reviewing outside history, ordering and reviewing medications, tests or procedures, care coordination (communications with other health care professionals or caregivers) and documentation in the medical record.  I,Emily Lagle,acting as a neurosurgeon for Emaline Saran, MD.,have documented all relevant documentation on the behalf of Emaline Saran, MD,as directed by  Emaline Saran, MD while in the presence of Emaline Saran, MD.  I have reviewed the above documentation for accuracy and completeness, and I agree with the above.  Sherese Heyward, MD

## 2024-09-07 ENCOUNTER — Inpatient Hospital Stay

## 2024-09-07 ENCOUNTER — Inpatient Hospital Stay: Attending: Hematology

## 2024-09-07 VITALS — BP 141/70 | HR 70 | Temp 98.3°F | Resp 18 | Wt 163.5 lb

## 2024-09-07 DIAGNOSIS — E785 Hyperlipidemia, unspecified: Secondary | ICD-10-CM | POA: Insufficient documentation

## 2024-09-07 DIAGNOSIS — K449 Diaphragmatic hernia without obstruction or gangrene: Secondary | ICD-10-CM | POA: Diagnosis not present

## 2024-09-07 DIAGNOSIS — Z7189 Other specified counseling: Secondary | ICD-10-CM

## 2024-09-07 DIAGNOSIS — C9000 Multiple myeloma not having achieved remission: Secondary | ICD-10-CM | POA: Insufficient documentation

## 2024-09-07 DIAGNOSIS — I10 Essential (primary) hypertension: Secondary | ICD-10-CM | POA: Diagnosis not present

## 2024-09-07 DIAGNOSIS — Z7901 Long term (current) use of anticoagulants: Secondary | ICD-10-CM | POA: Diagnosis not present

## 2024-09-07 DIAGNOSIS — M810 Age-related osteoporosis without current pathological fracture: Secondary | ICD-10-CM | POA: Insufficient documentation

## 2024-09-07 DIAGNOSIS — Z79899 Other long term (current) drug therapy: Secondary | ICD-10-CM | POA: Insufficient documentation

## 2024-09-07 DIAGNOSIS — Z8701 Personal history of pneumonia (recurrent): Secondary | ICD-10-CM | POA: Diagnosis not present

## 2024-09-07 DIAGNOSIS — Z87442 Personal history of urinary calculi: Secondary | ICD-10-CM | POA: Diagnosis not present

## 2024-09-07 DIAGNOSIS — Z5111 Encounter for antineoplastic chemotherapy: Secondary | ICD-10-CM | POA: Insufficient documentation

## 2024-09-07 DIAGNOSIS — I4891 Unspecified atrial fibrillation: Secondary | ICD-10-CM | POA: Insufficient documentation

## 2024-09-07 LAB — CMP (CANCER CENTER ONLY)
ALT: 12 U/L (ref 0–44)
AST: 15 U/L (ref 15–41)
Albumin: 4.2 g/dL (ref 3.5–5.0)
Alkaline Phosphatase: 40 U/L (ref 38–126)
Anion gap: 5 (ref 5–15)
BUN: 27 mg/dL — ABNORMAL HIGH (ref 8–23)
CO2: 31 mmol/L (ref 22–32)
Calcium: 9.5 mg/dL (ref 8.9–10.3)
Chloride: 102 mmol/L (ref 98–111)
Creatinine: 0.9 mg/dL (ref 0.44–1.00)
GFR, Estimated: >60 mL/min (ref >60)
Glucose, Bld: 95 mg/dL (ref 70–99)
Potassium: 4.2 mmol/L (ref 3.5–5.1)
Sodium: 138 mmol/L (ref 135–145)
Total Bilirubin: 0.6 mg/dL (ref 0.0–1.2)
Total Protein: 6.6 g/dL (ref 6.5–8.1)

## 2024-09-07 LAB — CBC WITH DIFFERENTIAL (CANCER CENTER ONLY)
Abs Immature Granulocytes: 0.02 K/uL (ref 0.00–0.07)
Basophils Absolute: 0 K/uL (ref 0.0–0.1)
Basophils Relative: 0 %
Eosinophils Absolute: 0.1 K/uL (ref 0.0–0.5)
Eosinophils Relative: 1 %
HCT: 33.3 % — ABNORMAL LOW (ref 36.0–46.0)
Hemoglobin: 10.8 g/dL — ABNORMAL LOW (ref 12.0–15.0)
Immature Granulocytes: 0 %
Lymphocytes Relative: 15 %
Lymphs Abs: 1.2 K/uL (ref 0.7–4.0)
MCH: 29.6 pg (ref 26.0–34.0)
MCHC: 32.4 g/dL (ref 30.0–36.0)
MCV: 91.2 fL (ref 80.0–100.0)
Monocytes Absolute: 0.5 K/uL (ref 0.1–1.0)
Monocytes Relative: 6 %
Neutro Abs: 5.7 K/uL (ref 1.7–7.7)
Neutrophils Relative %: 78 %
Platelet Count: 188 K/uL (ref 150–400)
RBC: 3.65 MIL/uL — ABNORMAL LOW (ref 3.87–5.11)
RDW: 14.1 % (ref 11.5–15.5)
WBC Count: 7.5 K/uL (ref 4.0–10.5)
nRBC: 0 % (ref 0.0–0.2)

## 2024-09-07 MED ORDER — DARATUMUMAB-HYALURONIDASE-FIHJ 1800-30000 MG-UT/15ML ~~LOC~~ SOLN
1800.0000 mg | Freq: Once | SUBCUTANEOUS | Status: AC
  Administered 2024-09-07: 1800 mg via SUBCUTANEOUS
  Filled 2024-09-07: qty 15

## 2024-09-07 MED ORDER — FAMOTIDINE 20 MG PO TABS
20.0000 mg | ORAL_TABLET | Freq: Once | ORAL | Status: AC
  Administered 2024-09-07: 20 mg via ORAL
  Filled 2024-09-07: qty 1

## 2024-09-07 MED ORDER — DEXAMETHASONE 6 MG PO TABS
20.0000 mg | ORAL_TABLET | Freq: Once | ORAL | Status: AC
  Administered 2024-09-07: 20 mg via ORAL

## 2024-09-07 MED ORDER — BORTEZOMIB CHEMO SQ INJECTION 3.5 MG (2.5MG/ML)
1.3000 mg/m2 | Freq: Once | INTRAMUSCULAR | Status: AC
  Administered 2024-09-07: 2.25 mg via SUBCUTANEOUS
  Filled 2024-09-07: qty 0.9

## 2024-09-07 MED ORDER — DENOSUMAB 120 MG/1.7ML ~~LOC~~ SOLN
120.0000 mg | Freq: Once | SUBCUTANEOUS | Status: AC
  Administered 2024-09-07: 120 mg via SUBCUTANEOUS
  Filled 2024-09-07: qty 1.7

## 2024-09-07 MED ORDER — DIPHENHYDRAMINE HCL 25 MG PO CAPS
50.0000 mg | ORAL_CAPSULE | Freq: Once | ORAL | Status: AC
  Administered 2024-09-07: 50 mg via ORAL
  Filled 2024-09-07: qty 2

## 2024-09-07 MED ORDER — ACETAMINOPHEN 325 MG PO TABS
650.0000 mg | ORAL_TABLET | Freq: Once | ORAL | Status: AC
  Administered 2024-09-07: 650 mg via ORAL
  Filled 2024-09-07: qty 2

## 2024-09-07 MED ORDER — MONTELUKAST SODIUM 10 MG PO TABS
10.0000 mg | ORAL_TABLET | Freq: Once | ORAL | Status: AC
  Administered 2024-09-07: 10 mg via ORAL
  Filled 2024-09-07: qty 1

## 2024-09-07 NOTE — Patient Instructions (Signed)
 [facility] - A DEPT OF [facility]   Discharge Instructions: Thank you for choosing [facility] to provide your oncology and hematology care.   If you have a lab appointment with the Cancer Center, please go directly to the Cancer Center and check in at the registration area.   Wear comfortable clothing and clothing appropriate for easy access to any Portacath or PICC line.   We strive to give you quality time with your provider. You may need to reschedule your appointment if you arrive late (15 or more minutes).  Arriving late affects you and other patients whose appointments are after yours.  Also, if you miss three or more appointments without notifying the office, you may be dismissed from the clinic at the provider's discretion.      For prescription refill requests, have your pharmacy contact our office and allow 72 hours for refills to be completed.    Today you received the following chemotherapy and/or immunotherapy agents: Bortezomib  (Velcade ) and daratumumab  hyaluronidase  (Darzalex  faspro)      To help prevent nausea and vomiting after your treatment, we encourage you to take your nausea medication as directed.  BELOW ARE SYMPTOMS THAT SHOULD BE REPORTED IMMEDIATELY: *FEVER GREATER THAN 100.4 F (38 C) OR HIGHER *CHILLS OR SWEATING *NAUSEA AND VOMITING THAT IS NOT CONTROLLED WITH YOUR NAUSEA MEDICATION *UNUSUAL SHORTNESS OF BREATH *UNUSUAL BRUISING OR BLEEDING *URINARY PROBLEMS (pain or burning when urinating, or frequent urination) *BOWEL PROBLEMS (unusual diarrhea, constipation, pain near the anus) TENDERNESS IN MOUTH AND THROAT WITH OR WITHOUT PRESENCE OF ULCERS (sore throat, sores in mouth, or a toothache) UNUSUAL RASH, SWELLING OR PAIN  UNUSUAL VAGINAL DISCHARGE OR ITCHING   Items with * indicate a potential emergency and should be followed up as soon as possible or go to the Emergency Department if any problems should  occur.  Please show the CHEMOTHERAPY ALERT CARD or IMMUNOTHERAPY ALERT CARD at check-in to the Emergency Department and triage nurse.  Should you have questions after your visit or need to cancel or reschedule your appointment, please contact [facility] - A DEPT OF [facility]  Dept: [phone]  and follow the prompts.  Office hours are 8:00 a.m. to 4:30 p.m. Monday - Friday. Please note that voicemails left after 4:00 p.m. may not be returned until the following business day.  We are closed weekends and major holidays. You have access to a nurse at all times for urgent questions. Please call the main number to the clinic Dept: [phone] and follow the prompts.   For any non-urgent questions, you may also contact your provider using MyChart. We now offer e-Visits for anyone 52 and older to request care online for non-urgent symptoms. For details visit [email or web address].   Also download the MyChart app! Go to the app store, search MyChart, open the app, select [place], and log in with your MyChart username and password.

## 2024-09-14 ENCOUNTER — Inpatient Hospital Stay

## 2024-09-14 ENCOUNTER — Other Ambulatory Visit: Payer: Self-pay

## 2024-09-14 ENCOUNTER — Encounter: Payer: Self-pay | Admitting: Hematology

## 2024-09-14 ENCOUNTER — Telehealth: Payer: Self-pay

## 2024-09-14 ENCOUNTER — Other Ambulatory Visit (HOSPITAL_COMMUNITY): Payer: Self-pay

## 2024-09-14 ENCOUNTER — Inpatient Hospital Stay: Admitting: Hematology

## 2024-09-14 ENCOUNTER — Inpatient Hospital Stay (HOSPITAL_BASED_OUTPATIENT_CLINIC_OR_DEPARTMENT_OTHER): Admitting: Hematology

## 2024-09-14 VITALS — BP 147/72 | HR 92 | Temp 97.0°F | Resp 17 | Ht 63.0 in | Wt 163.0 lb

## 2024-09-14 DIAGNOSIS — Z79899 Other long term (current) drug therapy: Secondary | ICD-10-CM | POA: Diagnosis not present

## 2024-09-14 DIAGNOSIS — C9000 Multiple myeloma not having achieved remission: Secondary | ICD-10-CM

## 2024-09-14 DIAGNOSIS — I4891 Unspecified atrial fibrillation: Secondary | ICD-10-CM | POA: Diagnosis not present

## 2024-09-14 DIAGNOSIS — Z5111 Encounter for antineoplastic chemotherapy: Secondary | ICD-10-CM | POA: Diagnosis not present

## 2024-09-14 DIAGNOSIS — Z7189 Other specified counseling: Secondary | ICD-10-CM

## 2024-09-14 DIAGNOSIS — Z7901 Long term (current) use of anticoagulants: Secondary | ICD-10-CM | POA: Diagnosis not present

## 2024-09-14 DIAGNOSIS — M810 Age-related osteoporosis without current pathological fracture: Secondary | ICD-10-CM | POA: Diagnosis not present

## 2024-09-14 LAB — CBC WITH DIFFERENTIAL (CANCER CENTER ONLY)
Abs Immature Granulocytes: 0.03 K/uL (ref 0.00–0.07)
Basophils Absolute: 0 K/uL (ref 0.0–0.1)
Basophils Relative: 0 %
Eosinophils Absolute: 0.2 K/uL (ref 0.0–0.5)
Eosinophils Relative: 2 %
HCT: 35.9 % — ABNORMAL LOW (ref 36.0–46.0)
Hemoglobin: 11.8 g/dL — ABNORMAL LOW (ref 12.0–15.0)
Immature Granulocytes: 0 %
Lymphocytes Relative: 17 %
Lymphs Abs: 1.2 K/uL (ref 0.7–4.0)
MCH: 30 pg (ref 26.0–34.0)
MCHC: 32.9 g/dL (ref 30.0–36.0)
MCV: 91.3 fL (ref 80.0–100.0)
Monocytes Absolute: 0.4 K/uL (ref 0.1–1.0)
Monocytes Relative: 5 %
Neutro Abs: 5.2 K/uL (ref 1.7–7.7)
Neutrophils Relative %: 76 %
Platelet Count: 183 K/uL (ref 150–400)
RBC: 3.93 MIL/uL (ref 3.87–5.11)
RDW: 14.2 % (ref 11.5–15.5)
WBC Count: 6.9 K/uL (ref 4.0–10.5)
nRBC: 0 % (ref 0.0–0.2)

## 2024-09-14 LAB — CMP (CANCER CENTER ONLY)
ALT: 14 U/L (ref 0–44)
AST: 17 U/L (ref 15–41)
Albumin: 4.3 g/dL (ref 3.5–5.0)
Alkaline Phosphatase: 41 U/L (ref 38–126)
Anion gap: 7 (ref 5–15)
BUN: 20 mg/dL (ref 8–23)
CO2: 29 mmol/L (ref 22–32)
Calcium: 9.4 mg/dL (ref 8.9–10.3)
Chloride: 104 mmol/L (ref 98–111)
Creatinine: 0.84 mg/dL (ref 0.44–1.00)
GFR, Estimated: >60 mL/min (ref >60)
Glucose, Bld: 100 mg/dL — ABNORMAL HIGH (ref 70–99)
Potassium: 4.3 mmol/L (ref 3.5–5.1)
Sodium: 140 mmol/L (ref 135–145)
Total Bilirubin: 0.5 mg/dL (ref 0.0–1.2)
Total Protein: 6.8 g/dL (ref 6.5–8.1)

## 2024-09-14 MED ORDER — BORTEZOMIB CHEMO SQ INJECTION 3.5 MG (2.5MG/ML)
1.3000 mg/m2 | Freq: Once | INTRAMUSCULAR | Status: AC
  Administered 2024-09-14: 2.25 mg via SUBCUTANEOUS
  Filled 2024-09-14: qty 0.9

## 2024-09-14 MED ORDER — LENALIDOMIDE 10 MG PO CAPS
10.0000 mg | ORAL_CAPSULE | Freq: Every day | ORAL | 1 refills | Status: DC
  Filled 2024-09-14: qty 21, 21d supply, fill #0

## 2024-09-14 MED ORDER — FAMOTIDINE 20 MG PO TABS
20.0000 mg | ORAL_TABLET | Freq: Once | ORAL | Status: AC
  Administered 2024-09-14: 20 mg via ORAL
  Filled 2024-09-14: qty 1

## 2024-09-14 MED ORDER — DEXAMETHASONE 6 MG PO TABS
20.0000 mg | ORAL_TABLET | Freq: Once | ORAL | Status: AC
  Administered 2024-09-14: 20 mg via ORAL
  Filled 2024-09-14: qty 2

## 2024-09-14 MED ORDER — DIPHENHYDRAMINE HCL 25 MG PO CAPS
50.0000 mg | ORAL_CAPSULE | Freq: Once | ORAL | Status: AC
  Administered 2024-09-14: 50 mg via ORAL
  Filled 2024-09-14: qty 2

## 2024-09-14 MED ORDER — MONTELUKAST SODIUM 10 MG PO TABS
10.0000 mg | ORAL_TABLET | Freq: Once | ORAL | Status: AC
  Administered 2024-09-14: 10 mg via ORAL
  Filled 2024-09-14: qty 1

## 2024-09-14 MED ORDER — DARATUMUMAB-HYALURONIDASE-FIHJ 1800-30000 MG-UT/15ML ~~LOC~~ SOLN
1800.0000 mg | Freq: Once | SUBCUTANEOUS | Status: AC
  Administered 2024-09-14: 1800 mg via SUBCUTANEOUS
  Filled 2024-09-14: qty 15

## 2024-09-14 MED ORDER — ACETAMINOPHEN 325 MG PO TABS
650.0000 mg | ORAL_TABLET | Freq: Once | ORAL | Status: AC
  Administered 2024-09-14: 650 mg via ORAL
  Filled 2024-09-14: qty 2

## 2024-09-14 NOTE — Telephone Encounter (Addendum)
 Oral Oncology Patient Advocate Encounter  Was successful in securing patient a $8000 grant from [facility] to provide copayment coverage for lenalidomide (REVLIMID) 10 MG capsule .  This will keep the out of pocket expense at $0.     [employer] ID: [id number]   The billing information is as follows and has been shared with [employer].    RxBin: [id number] PCN: [id number] Member ID: [id number] Group ID: [id number] Dates of Eligibility: 08/15/24 through 08/14/25  Fund:  MM  Lucie Lamer, CPhT [place]  [facility] Oncology Pharmacy Patient Advocate Specialist II [place] Phone: [phone]  Fax: [phone] [email or web address]

## 2024-09-14 NOTE — Progress Notes (Signed)
 HEMATOLOGY ONCOLOGY PROGRESS NOTE  Date of service: 09/14/2024  Patient Care Team: Larnell Hamilton, MD as PCP - General (Internal Medicine) Lavona Agent, MD as PCP - Cardiology (Cardiology) Cindie Ole DASEN, MD as PCP - Electrophysiology (Cardiology) Danielle Rom, MD as Consulting Physician (Obstetrics and Gynecology) Key, Hargis HERO, NP as Nurse Practitioner (Gynecology) Lavona Agent, MD as Consulting Physician (Cardiology) Onesimo Emaline Brink, MD as Consulting Physician (Hematology)  CHIEF COMPLAINT/PURPOSE OF CONSULTATION: Follow-up for continued evaluation and management of active multiple myeloma.  HISTORY OF PRESENTING ILLNESS:  (08/04/2023) Mary Potter is a wonderful 78 y.o. 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   Today, she is accompanied by her daughter and son-in-law. Patient has had two vertebral compression fractures in her L-spine. She reports that her initial back fracture was from pulling too hard while adjusting her shoes, and the second occurred after pushing on a trash can lid.    Her back pain continues to be bothersome, though it has improved. She did previously have mild pain raidating to her left lower extremity, which has resolved. Back brace does improve back pain.   The first time she had a compression fracture, she was told that she was developing the beginnings of osteopenia. Patient reports that she did not tolerate Fosamax and she stopped taking it. She has not had a bone density study recently. Her last bone density study from 2018 showed osteopenia. Patient has never been on prolia  previously, though there are considerations to start it soon.    She reports having a muscle spasm on her left side, which is settling with muscle relaxants. She denies any leg swelling.    Patient reports a history of kidney stones, during which she had protein in her  urine.   Patient has regularly been on vitamin D . She has been off of calcium  for several years ago because of her fhx of heart disease.   SUMMARY OF ONCOLOGIC HISTORY: Oncology History Overview Note  MMR IHC intact MSI stable   Endometrial adenocarcinoma (HCC)  02/10/2021 Imaging   Pelvic ultrasound: thickened endometrium with slight vascularity at the fundus.  The lining measured 13.5 mm.     02/16/2021 Initial Biopsy   EMB: [facility]   03/17/2021 Surgery   Robotic-assisted laparoscopic total hysterectomy with bilateral salpingoophorectomy, SLN biopsy   Findings: On EUA, small mobile uterus. Normal upper abdominal survey on intra-abdominal entry. Normal omentum, small and large bowel. 6-8 cm uterus, normal appearing. Normal bilateral adnexa. Mapping successful to bilateral SLNs. No intra-abdominal or pelvic evidence of disease.   03/17/2021 Pathology Results   A. SENTINEL LYMPH NODE, RIGHT EXTERNAL ILIAC, EXCISION:  - One lymph node negative for metastatic carcinoma (0/1).   B. SENTINEL LYMPH NODE, LEFT OBTURATOR, EXCISION:  - One lymph node negative for metastatic carcinoma (0/1).   C. 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      Focal superficial myometrial invasion.      No lymphovascular invasion.      Cervix, bilateral ovaries and bilateral fallopian tubes negative for  carcinoma.   ONCOLOGY TABLE:  UTERUS, CARCINOMA OR CARCINOSARCOMA: Resection  Procedure: Total hysterectomy, bilateral salpingo-oophorectomy with  right and left sentinel lymph nodes.  Histologic Type: Endometrioid adenocarcinoma.  Histologic Grade: FIGO grade 1.  Myometrial Invasion:       Depth of Myometrial Invasion: 2  mm.       Myometrial Thickness (mm): 25.       Percentage of Myometrial Invasion: 8%.  Uterine Serosa Involvement: Not identified.  Cervical stromal Involvement: Not identified.   Extent of involvement of other tissue/organs: Not identified.  Lymphovascular Invasion: Not identified.  Regional Lymph Nodes:       Pelvic Lymph Nodes Examined: 2                                      Sentinel: 2                                      Non-sentinel: 0                                      Total: 2       Lymph Nodes with Metastasis: 0  Pathologic Stage Classification (pTNM, AJCC 8th Edition): pT1a, pN0  Ancillary Studies: MMR and MSI testing have been ordered.  Representative Tumor Block: C3-C9.  Comment(s): Cytokeratin AE1/AE3 performed on the sentinel lymph nodes  (parts A and B) is negative for metastatic carcinoma.  (v4.2.0.1)    03/20/2021 Initial Diagnosis   Endometrial adenocarcinoma (HCC)   Multiple myeloma not having achieved remission (HCC)  06/10/2024 Initial Diagnosis   Multiple myeloma not having achieved remission (HCC)   06/22/2024 -  Chemotherapy   Patient is on Treatment Plan : MYELOMA NEWLY DIAGNOSED TRANSPLANT CANDIDATE DaraVRd (Daratumumab  SQ) q21d x 6 Cycles (Induction/Consolidation)       INTERVAL HISTORY:  Mary Potter is a 78 y.o. female who is here today for continued evaluation and management of active multiple myeloma. She is accompanied by her husband today.  Today, she reports an aching in her left side. She suspects this is muscular pain due to cleaning her over. She used a heating pad and took tylenol , which helped that day, but she woke up with the same pain this morning.   She is tolerating myeloma treatment well. She reports the bone marrow biopsy was previously painful and would like to hold off on a repeat biopsy She denies any fevers/chills, abdominal pain, new bone pains, or leg swelling. She denies any further concerns and is tolerating her medication well.    REVIEW OF SYSTEMS:   10 Point review of systems of done and is negative except as noted above.  MEDICAL HISTORY Past Medical History:  Diagnosis Date   Atrial  fibrillation (HCC)    Dysrhythmia    A-fib   Endometrial hyperplasia 01/03/2015   GERD (gastroesophageal reflux disease)    History of kidney stones    Hyperlipidemia    Hypertension    Pneumonia    PONV (postoperative nausea and vomiting)    slow to wake up   Thickened endometrium 01/03/2015   Vaginal delivery 1976, 1985    SURGICAL HISTORY Past Surgical History:  Procedure Laterality Date   ABDOMINAL HYSTERECTOMY     ANKLE FRACTURE SURGERY     ATRIAL FIBRILLATION ABLATION N/A 02/08/2022   Procedure: ATRIAL FIBRILLATION ABLATION;  Surgeon: Cindie Ole DASEN, MD;  Location: [facility];  Service: Cardiovascular;  Laterality: N/A;   CARDIOVERSION N/A 02/17/2023   Procedure: CARDIOVERSION;  Surgeon: Tobb, Kardie, DO;  Location: [facility];  Service: Cardiovascular;  Laterality: N/A;   CATARACT EXTRACTION Right 2020   DILATION AND CURETTAGE OF UTERUS  2012   ablation    EYE SURGERY     Corneal growth removal on right eye   HYSTEROSCOPY WITH D & C N/A 01/03/2015   Procedure: DILATATION AND CURETTAGE /HYSTEROSCOPY;  Surgeon: Robbi Render, MD;  Location: [facility];  Service: Gynecology;  Laterality: N/A;   LAPAROSCOPY     for endometriosis   ROBOTIC ASSISTED TOTAL HYSTERECTOMY WITH BILATERAL SALPINGO OOPHERECTOMY N/A 03/17/2021   Procedure: XI ROBOTIC ASSISTED TOTAL HYSTERECTOMY WITH BILATERAL SALPINGO OOPHORECTOMY;  Surgeon: Viktoria Comer SAUNDERS, MD;  Location: [facility];  Service: Gynecology;  Laterality: N/A;   SENTINEL NODE BIOPSY N/A 03/17/2021   Procedure: SENTINEL NODE BIOPSY;  Surgeon: Viktoria Comer SAUNDERS, MD;  Location: [facility];  Service: Gynecology;  Laterality: N/A;   TONSILLECTOMY AND ADENOIDECTOMY      SOCIAL HISTORY Social History   Tobacco Use   Smoking status: Never    Passive exposure: Past   Smokeless tobacco: Never   Tobacco comments:    Never smoke 03/02/23  Vaping Use   Vaping status: Never Used  Substance Use Topics   Alcohol  use: No     Alcohol /week: 0.0 standard drinks of alcohol    Drug use: No    Social History   Social History Narrative   Lives with her younger daughter, Brittany. Her older daughter lives in [place] with her family.    SOCIAL DRIVERS OF HEALTH SDOH Screenings   Food Insecurity: No Food Insecurity (06/19/2024)  Housing: Low Risk  (06/19/2024)  Transportation Needs: No Transportation Needs (06/19/2024)  Utilities: Not At Risk (06/19/2024)  Depression (PHQ2-9): Low Risk  (08/31/2024)  Tobacco Use: Low Risk  (05/14/2024)     FAMILY HISTORY Family History  Problem Relation Age of Onset   Heart disease Mother 35       CABG   Stroke Father    Prostate cancer Father    Heart disease Brother 40       Transplant, died 10 years   Heart disease Maternal Grandmother    Heart disease Paternal Grandmother    Cancer Paternal Grandfather    Heart disease Brother 68       RF   Leukemia Brother    Leukemia Brother    Hypertension Sister    Cervical cancer Sister    Colon cancer Neg Hx    Breast cancer Neg Hx    Ovarian cancer Neg Hx    Endometrial cancer Neg Hx    Pancreatic cancer Neg Hx      ALLERGIES: is allergic to talwin [pentazocine], benadryl  [diphenhydramine ], hydrocodone, and prednisone .  MEDICATIONS  Current Outpatient Medications  Medication Sig Dispense Refill   acetaminophen  (TYLENOL ) 500 MG tablet Take 1,000 mg by mouth every 6 (six) hours as needed (Arthritis Pain).     acyclovir  (ZOVIRAX ) 400 MG tablet Take 1 tablet (400 mg total) by mouth 2 (two) times daily. 60 tablet 5   ALPRAZolam (XANAX) 0.5 MG tablet Take 0.25 mg by mouth daily as needed (anxiousness).     apixaban  (ELIQUIS ) 5 MG TABS tablet Take 1 tablet (5 mg total) by mouth 2 (two) times daily. 180 tablet 3   b complex vitamins capsule Take 1 capsule by mouth daily.     Calcium  Carb-Cholecalciferol 600-3.125 MG-MCG TABS      Cholecalciferol (VITAMIN D ) 50 MCG (2000 UT) CAPS Take 2,000 Units by  mouth at bedtime.      dexamethasone  (DECADRON ) 4 MG tablet Take 5 tabs (20 mg) weekly the day after daratumumab  for 12 weeks. Take with breakfast. 20 tablet 5   esomeprazole  (NEXIUM ) 40 MG capsule Take 1 capsule (40 mg total) by mouth daily before breakfast. 30 capsule 2   estradiol  (VIVELLE -DOT) 0.025 MG/24HR PLACE 1 PATCH ONTO THE SKIN 2 (TWO) TIMES A WEEK. SUNDAYS & WEDNESDAYS 8 patch 0   metoprolol  tartrate (LOPRESSOR ) 50 MG tablet TAKE ONE TABLET BY MOUTH TWICE A DAY 180 tablet 3   olmesartan (BENICAR) 40 MG tablet Take 40 mg by mouth daily.     ondansetron  (ZOFRAN ) 8 MG tablet Take 1 tablet (8 mg total) by mouth every 8 (eight) hours as needed for nausea or vomiting. 30 tablet 1   Polyethyl Glycol-Propyl Glycol (SYSTANE OP) Place 1 drop into both eyes daily.     prochlorperazine  (COMPAZINE ) 10 MG tablet Take 1 tablet (10 mg total) by mouth every 6 (six) hours as needed for nausea or vomiting. 30 tablet 1   rosuvastatin  (CRESTOR ) 20 MG tablet Take 1 tablet (20 mg total) by mouth every evening. 90 tablet 3   No current facility-administered medications for this visit.    PHYSICAL EXAMINATION: ECOG PERFORMANCE STATUS: 1 - Symptomatic but completely ambulatory VITALS: There were no vitals filed for this visit. There were no vitals filed for this visit. There is no height or weight on file to calculate BMI.  GENERAL: alert, in no acute distress and comfortable SKIN: no acute rashes, no significant lesions EYES: conjunctiva are pink and non-injected, sclera anicteric OROPHARYNX: MMM, no exudates, no oropharyngeal erythema or ulceration NECK: supple, no JVD LYMPH:  no palpable lymphadenopathy in the cervical, axillary or inguinal regions LUNGS: clear to auscultation b/l with normal respiratory effort HEART: regular rate & rhythm ABDOMEN:  normoactive bowel sounds , non tender, not distended, no hepatosplenomegaly Extremity: no pedal edema PSYCH: alert & oriented x 3 with fluent speech NEURO: no focal  motor/sensory deficits  LABORATORY DATA:   I have reviewed the data as listed     Latest Ref Rng & Units 09/07/2024   12:53 PM 08/31/2024   12:51 PM 08/24/2024    2:32 PM  CBC EXTENDED  WBC 4.0 - 10.5 K/uL 7.5  6.3  7.4   RBC 3.87 - 5.11 MIL/uL 3.65  3.71  3.61   Hemoglobin 12.0 - 15.0 g/dL 89.1  88.8  89.1   HCT 36.0 - 46.0 % 33.3  34.0  33.6   Platelets 150 - 400 K/uL 188  202  193   NEUT# 1.7 - 7.7 K/uL 5.7  4.9  5.8   Lymph# 0.7 - 4.0 K/uL 1.2  0.9  0.9        Latest Ref Rng & Units 09/07/2024   12:53 PM 08/31/2024   12:51 PM 08/24/2024    2:32 PM  CMP  Glucose 70 - 99 mg/dL 95  892  98   BUN 8 - 23 mg/dL 27  25  20    Creatinine 0.44 - 1.00 mg/dL 9.09  9.18  9.07   Sodium 135 - 145 mmol/L 138  140  139   Potassium 3.5 - 5.1 mmol/L 4.2  4.2  4.2   Chloride 98 - 111 mmol/L 102  105  104   CO2 22 - 32 mmol/L 31  30  30    Calcium  8.9 - 10.3 mg/dL 9.5  9.7  9.5   Total Protein 6.5 -  8.1 g/dL 6.6  6.8  7.0   Total Bilirubin 0.0 - 1.2 mg/dL 0.6  0.5  0.6   Alkaline Phos 38 - 126 U/L 40  43  45   AST 15 - 41 U/L 15  15  16    ALT 0 - 44 U/L 12  14  14      Multiple Myeloma Panel 08/24/2024    Kappa Lambda Light Chain 08/24/2024      IFE/PE, serum 07/21/2023:  05/14/2024 Surgical Pathology   Bone Marrow Report    Clinical History: MYELOMA    DIAGNOSIS:    BONE MARROW, ASPIRATE, CLOT, CORE:  - Hypercellular bone marrow (60%) involved by plasma cell neoplasm (63%  plasma cells by manual aspirate differential, approximately 70% by CD138  immunohistochemical analysis, and kappa monotypic by kappa/lambda in  situ hybridization).  See comment.    PERIPHERAL BLOOD:  - Normocytic normochromic anemia    COMMENT:    Correlation with clinical findings, radiographic skeletal survey,  myeloma panel and other laboratory tests (SPEP, UPEP, beta  microglobulin, etc.) is recommended for a complete assessment and  classification of this case.      05/18/2024   RADIOGRAPHIC STUDIES: I have personally reviewed the radiological images as listed and agreed with the findings in the report.   10/03/2023 DG BONE DENSITY (DXA)   IMPRESSION: Referring Physician:  GLENDIA FREEMAN Your patient completed a bone mineral density test using [employer]ar iDXA system (analysis version: 16). Technologist: ALW PATIENT: Name: Aubreigh, Fuerte Patient ID: [id number] Birth Date: 1946/10/10 Height: 63.0 in. Sex: Female Measured: 10/03/2023 Weight: 156.4 lbs. Indications: Advanced Age, Bilateral Ovariectomy, Caucasian, Estrogen Deficiency, Family Hist. (Parent hip fracture), Family History of Osteoporosis, History of Fracture (Adult), Hysterectomy, Nexium , Post Menopausal Fractures: Vertebra Treatments: Estrogen Patch, Vitamin D , Prolia    ASSESSMENT: The BMD measured at Left Forearm Radius 33% is 0.684 g/cm2 with a T-score of -2.2. This patient is considered to have osteopenia/low bone mass according to World Health Organization [facility]) criteria.   The scan quality is good. Lumbar Spine excluded due to degenerative changes. No FRAX due to patient taking Prolia    Site Region Measured Date Measured Age YA BMD Significant CHANGE T-score DualFemur Neck Left 10/03/2023 76.7 -2.2 0.734 g/cm2   Left Forearm Radius 33% 10/03/2023 76.7 -2.2 0.684 g/cm2   DualFemur Total Mean 10/03/2023 76.7 -1.7 0.788 g/cm2   World Health Organization [facility]) criteria for post-menopausal, Caucasian Women: Normal       T-score at or above -1 SD Osteopenia   T-score between -1 and -2.5 SD Osteoporosis T-score at or below -2.5 SD   RECOMMENDATION: 1. All patients should optimize calcium  and vitamin D  intake. 2. Consider FDA approved medical therapies in postmenopausal women and men aged 72 years and older, based on the following: a. A hip or vertebral (clinical or morphometric) fracture b. T-score = -2.5 at the femoral neck or spine after appropriate evaluation to  exclude secondary causes c. Low bone mass (T-score between -1.0 and -2.5 at the femoral neck or spine) and a 10- year probability of a hip fracture = 3% or a 10 year probability of a major osteoporosis-related fracture = 20% based on the [place] -adapted WHO algorithm. 3. Clinician judgement and/or patient preference may indicate treatment for people with10-year fracture probabilities above or below these levels.   FOLLOW-UP: Patients with diagnosis of osteoporosis or at high risk for fracture should have regular bone mineral density tests . For patients eligible for Medicare routine testing is  allowed once every 2 years. The testing frequency can be increased to one year for patients who have rapidly progressing disease, those who are receiving or discontinuing medical therapy to restore bone mass, or have additional risk factors.   I have reviewed this study and agree with the findings. [facility]   Electronically Signed   By: Norman Hopper M.D.   On: 10/03/2023 13:06    04/30/2024 NM PET Image Initial (PI) Skull Base To Thigh  CLINICAL DATA:  Initial treatment strategy for myeloma.    TECHNIQUE: 8.09 mCi F-18 FDG was injected intravenously. Full-ring PET imaging was performed from the skull base to thigh after the radiotracer. CT data was obtained and used for attenuation correction and anatomic localization.   Fasting blood glucose: 116 mg/dl   COMPARISON:  Lumbar spine CT 08/19/2023. Abdomen pelvis CT with contrast 09/12/2020. skeletal bone survey 08/15/2023.   FINDINGS: Mediastinal blood pool activity: SUV max 2.7   Liver activity: SUV max 3.1   NECK: No specific abnormal uptake identified in the neck including along lymph node change of the submandibular, posterior triangle or internal jugular region. Near symmetric uptake of the visualized intracranial compartment.   Incidental CT findings: The parotid glands, submandibular glands and thyroid  gland  are unremarkable. Significant streak artifact related to the patient's dental hardware. Paranasal sinuses and mastoid air cells are clear. Right middle turbinate concha bullosa.   CHEST: No specific abnormal uptake identified above blood pool in the axillary regions, hilum or mediastinum. No abnormal lung parenchymal uptake.   Incidental CT findings: Small hiatal hernia. Slightly patulous thoracic esophagus. No pericardial effusion. Scattered vascular calcifications are seen including along the coronary arteries. The thoracic aorta has a normal course and caliber. Scattered calcified plaque. Breathing motion seen throughout the examination with some mild scattered ground-glass identified along the lower lung zones, nonspecific. No consolidation, pneumothorax or effusion. There is some middle lobe calcified nodules identified on image 72 of the CT scan, series 4.   ABDOMEN/PELVIS: There is physiologic distribution radiotracer along the parenchymal organs, bowel and renal collecting systems. Specifically no abnormal uptake along the spleen. Splenic length approaches 10.4 cm. No abnormal uptake along lymph node chains in the abdomen and pelvis.   Incidental CT findings: On this limited noncontrast examination, the liver, adrenal glands unremarkable. There is some fatty atrophy of the pancreas. Stone in the gallbladder. No abnormal calcifications seen within either kidney nor along the course of either ureter. Underdistended urinary bladder. Large bowel has a normal course and caliber. Left-sided colonic diverticula. Scattered colonic stool. Stomach and small bowel are nondilated.   SKELETON: There is some mild uptake seen in the spine in the area of compression deformity non additions cement at T12 through L2. There is some minimal uptake along the proximal right humeral metaphyseal shaft with maximum SUV of 3.0. No erosive changes along the bone in this location. Nonspecific. No  additional areas of abnormal uptake along the visualized osseous structures.   Incidental CT findings: Diffuse degenerative changes identified.   IMPRESSION: No specific abnormal radiotracer uptake identified. This includes along lymph node chains, spleen. No splenomegaly.   Foley slight asymmetric uptake along the proximal right humeral shaft of maximum SUV of 3.0, nonspecific. This is in a different location than the lucent lesion seen on the bone survey October 2024.   Augmentation cement seen at the spine at T12 through L2. Colonic diverticula. Gallstone. Hiatal hernia.     Electronically Signed   By: Ranell  Charlanne M.D.   On: 05/01/2024 16:57       ASSESSMENT & PLAN:  78 y.o. 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 PET/CT 04/30/2024 nonspecific right humeral shaft uptake.  No definitive bone lesions. Bone marrow biopsy shows 70% kappa restricted plasma cells consistent with active myeloma based on the presence of more than 60% involvement with plasma cell neoplasm and also based on light chain criteria. - Molecular cytogenetics show 13 q. deletion suggesting standard risk myeloma. Pretreatment serology show IgA level of 1886 and an M spike of 1 g/dL Kappa free light chains of 265 with a kappa lambda ratio of 106 --Started Dara/Velcade /Dex on 06/22/2024   2. History of A-fib and is on anticoagulation.  No previous history of MI or CVA or VTE.   3. History of hiatal hernia    PLAN: - Discussed lab results on 09/14/2024 in detail with patient: -M Protein has stabilized at 0.2 -Light chains are in a stable place, IgG Kappa protein is at 0.2,  -Suggested Voltaren gel OTC to help with muscle pain in the back -Discussed switch to maintenance after her last cycle due to very good partial response to treatment -Option of repeat PET scan before maintenance  -Discussion of Bone marrow biopsy to provide objective marker to see if there is a mutation in  the plasma cells before continuing on to maintenance treatment, decided to hold off on this  -Switching to half dose of Revlimid  after her final cycle of treatment, will look for histamine response (rashes, potassium loss) -Discussed discontinuing Daratumumab   -Will continue to RTC every few months for maintenance of Xgeva  shot -Follow-up   FOLLOW-UP Per integrated scheduling to complete C5 and C6 as per orders. MD visit in 3-4 weeks   .The total time spent in the appointment was 30 minutes* .  All of the patient's questions were answered with apparent satisfaction. The patient knows to call the clinic with any problems, questions or concerns.   Emaline Saran MD MS AAHIVMS [facility] Hematology/Oncology Physician [facility]  .*Total Encounter Time as defined by the Centers for Medicare and Medicaid Services includes, in addition to the face-to-face time of a patient visit (documented in the note above) non-face-to-face time: obtaining and reviewing outside history, ordering and reviewing medications, tests or procedures, care coordination (communications with other health care professionals or caregivers) and documentation in the medical record.

## 2024-09-14 NOTE — Telephone Encounter (Addendum)
 Oral Oncology Patient Advocate Encounter  After completing a benefits investigation, prior authorization for lenalidomide is not required at this time through [employer].  Patient's copay is $2,853.15.   Getting a [employer] Grant to help with cost. It must be filled at [employer].  Lucie Lamer, CPhT [place]  [facility] Oncology Pharmacy Patient Advocate Specialist [address] Phone: [phone]  Fax: [phone] [email or web address]

## 2024-09-14 NOTE — Patient Instructions (Signed)
 [facility] - A DEPT OF [facility]  Discharge Instructions: Thank you for choosing [facility] to provide your oncology and hematology care.   If you have a lab appointment with the Cancer Center, please go directly to the Cancer Center and check in at the registration area.   Wear comfortable clothing and clothing appropriate for easy access to any Portacath or PICC line.   We strive to give you quality time with your provider. You may need to reschedule your appointment if you arrive late (15 or more minutes).  Arriving late affects you and other patients whose appointments are after yours.  Also, if you miss three or more appointments without notifying the office, you may be dismissed from the clinic at the provider's discretion.      For prescription refill requests, have your pharmacy contact our office and allow 72 hours for refills to be completed.    Today you received the following chemotherapy and/or immunotherapy agents darzalex  faspro and velcade       To help prevent nausea and vomiting after your treatment, we encourage you to take your nausea medication as directed.  BELOW ARE SYMPTOMS THAT SHOULD BE REPORTED IMMEDIATELY: *FEVER GREATER THAN 100.4 F (38 C) OR HIGHER *CHILLS OR SWEATING *NAUSEA AND VOMITING THAT IS NOT CONTROLLED WITH YOUR NAUSEA MEDICATION *UNUSUAL SHORTNESS OF BREATH *UNUSUAL BRUISING OR BLEEDING *URINARY PROBLEMS (pain or burning when urinating, or frequent urination) *BOWEL PROBLEMS (unusual diarrhea, constipation, pain near the anus) TENDERNESS IN MOUTH AND THROAT WITH OR WITHOUT PRESENCE OF ULCERS (sore throat, sores in mouth, or a toothache) UNUSUAL RASH, SWELLING OR PAIN  UNUSUAL VAGINAL DISCHARGE OR ITCHING   Items with * indicate a potential emergency and should be followed up as soon as possible or go to the Emergency Department if any problems should occur.  Please show the CHEMOTHERAPY ALERT CARD  or IMMUNOTHERAPY ALERT CARD at check-in to the Emergency Department and triage nurse.  Should you have questions after your visit or need to cancel or reschedule your appointment, please contact [facility]  Dept: [phone]  and follow the prompts.  Office hours are 8:00 a.m. to 4:30 p.m. Monday - Friday. Please note that voicemails left after 4:00 p.m. may not be returned until the following business day.  We are closed weekends and major holidays. You have access to a nurse at all times for urgent questions. Please call the main number to the clinic Dept: [phone] and follow the prompts.   For any non-urgent questions, you may also contact your provider using MyChart. We now offer e-Visits for anyone 43 and older to request care online for non-urgent symptoms. For details visit [email or web address].   Also download the MyChart app! Go to the app store, search MyChart, open the app, select [facility], and log in with your MyChart username and password.

## 2024-09-14 NOTE — Telephone Encounter (Addendum)
 Oral Oncology Pharmacist Encounter  Received new prescription for Revlimid (lenalidomide) for the maintenance treatment of multiple myeloma after completion of DaraVRd induction therapy, planned duration until disease progression or unacceptable toxicity.  Labs from 09/14/2024 (CBC and CMP) assessed, no interventions needed (CrCl of 76.1 based on Scr of 0.84). Prescription dose and frequency assessed for appropriateness. Prescription will be changed to 21 days on, 7 days off prior to sending to correct pharmacy.   Current medication list in Epic reviewed, no significant/ relevant DDIs with Revlimid identified: patient is no longer getting the estradiol  filled and last dispensed in 2023.  Evaluated chart and no patient barriers to medication adherence noted.   Patient agreement for treatment documented in MD note on 09/14/24.  Prescription has been e-scribed to the [facility] for benefits analysis and approval. Oral Oncology Clinic will continue to follow for insurance authorization, copayment issues, initial counseling and start date.  Tiffney Haughton, PharmD Hematology/Oncology Clinical Pharmacist [facility] [phone] 09/14/2024 11:26 AM

## 2024-09-16 ENCOUNTER — Encounter: Payer: Self-pay | Admitting: Hematology

## 2024-09-17 LAB — KAPPA/LAMBDA LIGHT CHAINS
Kappa free light chain: 4.8 mg/L (ref 3.3–19.4)
Kappa, lambda light chain ratio: 2 — ABNORMAL HIGH (ref 0.26–1.65)
Lambda free light chains: 2.4 mg/L — ABNORMAL LOW (ref 5.7–26.3)

## 2024-09-17 LAB — MULTIPLE MYELOMA PANEL, SERUM
Albumin SerPl Elph-Mcnc: 3.6 g/dL (ref 2.9–4.4)
Albumin/Glob SerPl: 1.3 (ref 0.7–1.7)
Alpha 1: 0.3 g/dL (ref 0.0–0.4)
Alpha2 Glob SerPl Elph-Mcnc: 1.1 g/dL — ABNORMAL HIGH (ref 0.4–1.0)
B-Globulin SerPl Elph-Mcnc: 1.2 g/dL (ref 0.7–1.3)
Gamma Glob SerPl Elph-Mcnc: 0.5 g/dL (ref 0.4–1.8)
Globulin, Total: 3 g/dL (ref 2.2–3.9)
IgA: 15 mg/dL — ABNORMAL LOW (ref 64–422)
IgG (Immunoglobin G), Serum: 614 mg/dL (ref 586–1602)
IgM (Immunoglobulin M), Srm: 16 mg/dL — ABNORMAL LOW (ref 26–217)
M Protein SerPl Elph-Mcnc: 0.2 g/dL — ABNORMAL HIGH
Total Protein ELP: 6.6 g/dL (ref 6.0–8.5)

## 2024-09-18 ENCOUNTER — Other Ambulatory Visit: Payer: Self-pay

## 2024-09-18 DIAGNOSIS — C9000 Multiple myeloma not having achieved remission: Secondary | ICD-10-CM

## 2024-09-18 MED ORDER — LENALIDOMIDE 10 MG PO CAPS: 10.0000 mg | ORAL_CAPSULE | Freq: Every day | ORAL | 0 refills | Status: DC

## 2024-09-18 MED ORDER — LENALIDOMIDE 10 MG PO CAPS: 10.0000 mg | ORAL_CAPSULE | Freq: Every day | ORAL | 1 refills | Status: DC

## 2024-09-21 ENCOUNTER — Inpatient Hospital Stay

## 2024-09-21 ENCOUNTER — Encounter: Payer: Self-pay | Admitting: Hematology

## 2024-09-21 VITALS — BP 131/60 | HR 66 | Temp 98.0°F | Resp 18 | Wt 165.8 lb

## 2024-09-21 DIAGNOSIS — Z7189 Other specified counseling: Secondary | ICD-10-CM

## 2024-09-21 DIAGNOSIS — Z7901 Long term (current) use of anticoagulants: Secondary | ICD-10-CM | POA: Diagnosis not present

## 2024-09-21 DIAGNOSIS — Z5111 Encounter for antineoplastic chemotherapy: Secondary | ICD-10-CM | POA: Diagnosis not present

## 2024-09-21 DIAGNOSIS — C9000 Multiple myeloma not having achieved remission: Secondary | ICD-10-CM | POA: Diagnosis not present

## 2024-09-21 DIAGNOSIS — M810 Age-related osteoporosis without current pathological fracture: Secondary | ICD-10-CM | POA: Diagnosis not present

## 2024-09-21 DIAGNOSIS — I4891 Unspecified atrial fibrillation: Secondary | ICD-10-CM | POA: Diagnosis not present

## 2024-09-21 DIAGNOSIS — Z79899 Other long term (current) drug therapy: Secondary | ICD-10-CM | POA: Diagnosis not present

## 2024-09-21 LAB — CMP (CANCER CENTER ONLY)
ALT: 17 U/L (ref 0–44)
AST: 21 U/L (ref 15–41)
Albumin: 4.2 g/dL (ref 3.5–5.0)
Alkaline Phosphatase: 43 U/L (ref 38–126)
Anion gap: 8 (ref 5–15)
BUN: 23 mg/dL (ref 8–23)
CO2: 28 mmol/L (ref 22–32)
Calcium: 9.4 mg/dL (ref 8.9–10.3)
Chloride: 104 mmol/L (ref 98–111)
Creatinine: 0.81 mg/dL (ref 0.44–1.00)
GFR, Estimated: >60 mL/min (ref >60)
Glucose, Bld: 118 mg/dL — ABNORMAL HIGH (ref 70–99)
Potassium: 4.5 mmol/L (ref 3.5–5.1)
Sodium: 140 mmol/L (ref 135–145)
Total Bilirubin: 0.4 mg/dL (ref 0.0–1.2)
Total Protein: 6.7 g/dL (ref 6.5–8.1)

## 2024-09-21 LAB — CBC WITH DIFFERENTIAL (CANCER CENTER ONLY)
Abs Immature Granulocytes: 0.02 K/uL (ref 0.00–0.07)
Basophils Absolute: 0 K/uL (ref 0.0–0.1)
Basophils Relative: 0 %
Eosinophils Absolute: 0.1 K/uL (ref 0.0–0.5)
Eosinophils Relative: 2 %
HCT: 32.3 % — ABNORMAL LOW (ref 36.0–46.0)
Hemoglobin: 10.4 g/dL — ABNORMAL LOW (ref 12.0–15.0)
Immature Granulocytes: 0 %
Lymphocytes Relative: 16 %
Lymphs Abs: 1 K/uL (ref 0.7–4.0)
MCH: 29.5 pg (ref 26.0–34.0)
MCHC: 32.2 g/dL (ref 30.0–36.0)
MCV: 91.8 fL (ref 80.0–100.0)
Monocytes Absolute: 0.4 K/uL (ref 0.1–1.0)
Monocytes Relative: 6 %
Neutro Abs: 4.9 K/uL (ref 1.7–7.7)
Neutrophils Relative %: 76 %
Platelet Count: 162 K/uL (ref 150–400)
RBC: 3.52 MIL/uL — ABNORMAL LOW (ref 3.87–5.11)
RDW: 14.2 % (ref 11.5–15.5)
WBC Count: 6.5 K/uL (ref 4.0–10.5)
nRBC: 0 % (ref 0.0–0.2)

## 2024-09-21 MED ORDER — BORTEZOMIB CHEMO SQ INJECTION 3.5 MG (2.5MG/ML)
1.3000 mg/m2 | Freq: Once | INTRAMUSCULAR | Status: AC
  Administered 2024-09-21: 2.25 mg via SUBCUTANEOUS
  Filled 2024-09-21: qty 0.9

## 2024-09-21 MED ORDER — FAMOTIDINE 20 MG PO TABS
20.0000 mg | ORAL_TABLET | Freq: Once | ORAL | Status: AC
  Administered 2024-09-21: 20 mg via ORAL
  Filled 2024-09-21: qty 1

## 2024-09-21 MED ORDER — MONTELUKAST SODIUM 10 MG PO TABS
10.0000 mg | ORAL_TABLET | Freq: Once | ORAL | Status: AC
  Administered 2024-09-21: 10 mg via ORAL
  Filled 2024-09-21: qty 1

## 2024-09-21 MED ORDER — PROCHLORPERAZINE MALEATE 10 MG PO TABS
10.0000 mg | ORAL_TABLET | Freq: Once | ORAL | Status: AC
  Administered 2024-09-21: 10 mg via ORAL
  Filled 2024-09-21: qty 1

## 2024-09-21 MED ORDER — DEXAMETHASONE 6 MG PO TABS
20.0000 mg | ORAL_TABLET | Freq: Once | ORAL | Status: AC
  Administered 2024-09-21: 20 mg via ORAL
  Filled 2024-09-21: qty 2

## 2024-09-21 NOTE — Patient Instructions (Signed)
 [facility] - A DEPT OF [facility]   Discharge Instructions: Thank you for choosing [facility] to provide your oncology and hematology care.   If you have a lab appointment with the Cancer Center, please go directly to the Cancer Center and check in at the registration area.   Wear comfortable clothing and clothing appropriate for easy access to any Portacath or PICC line.   We strive to give you quality time with your provider. You may need to reschedule your appointment if you arrive late (15 or more minutes).  Arriving late affects you and other patients whose appointments are after yours.  Also, if you miss three or more appointments without notifying the office, you may be dismissed from the clinic at the provider's discretion.      For prescription refill requests, have your pharmacy contact our office and allow 72 hours for refills to be completed.    Today you received the following chemotherapy and/or immunotherapy agents: Bortezomib (Velcade)      To help prevent nausea and vomiting after your treatment, we encourage you to take your nausea medication as directed.  BELOW ARE SYMPTOMS THAT SHOULD BE REPORTED IMMEDIATELY: *FEVER GREATER THAN 100.4 F (38 C) OR HIGHER *CHILLS OR SWEATING *NAUSEA AND VOMITING THAT IS NOT CONTROLLED WITH YOUR NAUSEA MEDICATION *UNUSUAL SHORTNESS OF BREATH *UNUSUAL BRUISING OR BLEEDING *URINARY PROBLEMS (pain or burning when urinating, or frequent urination) *BOWEL PROBLEMS (unusual diarrhea, constipation, pain near the anus) TENDERNESS IN MOUTH AND THROAT WITH OR WITHOUT PRESENCE OF ULCERS (sore throat, sores in mouth, or a toothache) UNUSUAL RASH, SWELLING OR PAIN  UNUSUAL VAGINAL DISCHARGE OR ITCHING   Items with * indicate a potential emergency and should be followed up as soon as possible or go to the Emergency Department if any problems should occur.  Please show the CHEMOTHERAPY ALERT CARD or  IMMUNOTHERAPY ALERT CARD at check-in to the Emergency Department and triage nurse.  Should you have questions after your visit or need to cancel or reschedule your appointment, please contact [facility] - A DEPT OF [facility]  Dept: [phone]  and follow the prompts.  Office hours are 8:00 a.m. to 4:30 p.m. Monday - Friday. Please note that voicemails left after 4:00 p.m. may not be returned until the following business day.  We are closed weekends and major holidays. You have access to a nurse at all times for urgent questions. Please call the main number to the clinic Dept: [phone] and follow the prompts.   For any non-urgent questions, you may also contact your provider using MyChart. We now offer e-Visits for anyone 75 and older to request care online for non-urgent symptoms. For details visit [email or web address].   Also download the MyChart app! Go to the app store, search "MyChart", open the app, select , and log in with your MyChart username and password.

## 2024-09-24 ENCOUNTER — Encounter: Payer: Self-pay | Admitting: Hematology

## 2024-09-24 NOTE — Telephone Encounter (Signed)
 Oral Chemotherapy Pharmacist Encounter  I spoke with patient for overview of: Revlimid  for the maintenance treatment of multiple myeloma in conjunction with Velcade  and dexamethasone , planned duration until disease progression or unacceptable toxicity.   Treatment goal: Control  Counseled patient on administration, dosing, side effects, monitoring, drug-food interactions, safe handling, storage, and disposal.  Patient will take Revlimid  10mg  capsules, 1 capsule by mouth once daily, without regard to food, with a full glass of water . Revlimid  will be given 21 days on, 7 days off, repeat every 28 days.  Revlimid  start date: 09/26/2024  Adverse effects of Revlimid  include but are not limited to: nausea, constipation, diarrhea, abdominal pain, rash, fatigue, drug fever, and decreased blood counts.    Reviewed with patient importance of keeping a medication schedule and plan for any missed doses. No barriers to medication adherence identified. Medication reconciliation performed and medication/allergy list updated. Patient is already on eliquis  and does not need additional coverage for VTE prophylaxis.   Distress thermometer not completed during telephone call as patient has been on previous lines of therapy.   Communication and Learning Assessment Primary learner: Patient Barriers to learning: No barriers Preferred language: English Learning preferences: Listening Reading  All questions answered. Patient voiced understanding and appreciation. Medication education handout placed in mail for patient. Patient knows to call the office with questions or concerns. Oral Chemotherapy Clinic phone number provided to patient.   Jahlia Omura, PharmD Hematology/Oncology Clinical Pharmacist [facility] [phone] 09/24/2024    11:12 AM

## 2024-09-25 DIAGNOSIS — H16223 Keratoconjunctivitis sicca, not specified as Sjogren's, bilateral: Secondary | ICD-10-CM | POA: Diagnosis not present

## 2024-09-25 DIAGNOSIS — Z961 Presence of intraocular lens: Secondary | ICD-10-CM | POA: Diagnosis not present

## 2024-09-25 DIAGNOSIS — H18452 Nodular corneal degeneration, left eye: Secondary | ICD-10-CM | POA: Diagnosis not present

## 2024-10-05 ENCOUNTER — Inpatient Hospital Stay

## 2024-10-05 ENCOUNTER — Inpatient Hospital Stay: Admitting: Hematology

## 2024-10-07 ENCOUNTER — Other Ambulatory Visit: Payer: Self-pay | Admitting: Hematology

## 2024-10-08 ENCOUNTER — Encounter: Payer: Self-pay | Admitting: Hematology

## 2024-10-10 ENCOUNTER — Other Ambulatory Visit: Payer: Self-pay

## 2024-10-10 DIAGNOSIS — C9000 Multiple myeloma not having achieved remission: Secondary | ICD-10-CM

## 2024-10-10 MED ORDER — LENALIDOMIDE 10 MG PO CAPS: 10.0000 mg | ORAL_CAPSULE | Freq: Every day | ORAL | 0 refills | Status: DC

## 2024-10-12 ENCOUNTER — Inpatient Hospital Stay

## 2024-10-15 ENCOUNTER — Inpatient Hospital Stay: Admitting: Hematology

## 2024-10-15 ENCOUNTER — Inpatient Hospital Stay: Attending: Hematology

## 2024-10-15 VITALS — BP 149/69 | HR 69 | Temp 97.2°F | Resp 20 | Wt 165.9 lb

## 2024-10-15 DIAGNOSIS — I10 Essential (primary) hypertension: Secondary | ICD-10-CM | POA: Insufficient documentation

## 2024-10-15 DIAGNOSIS — Z806 Family history of leukemia: Secondary | ICD-10-CM | POA: Insufficient documentation

## 2024-10-15 DIAGNOSIS — Z90722 Acquired absence of ovaries, bilateral: Secondary | ICD-10-CM | POA: Diagnosis not present

## 2024-10-15 DIAGNOSIS — Z8701 Personal history of pneumonia (recurrent): Secondary | ICD-10-CM | POA: Insufficient documentation

## 2024-10-15 DIAGNOSIS — Z5112 Encounter for antineoplastic immunotherapy: Secondary | ICD-10-CM | POA: Diagnosis not present

## 2024-10-15 DIAGNOSIS — M62838 Other muscle spasm: Secondary | ICD-10-CM | POA: Insufficient documentation

## 2024-10-15 DIAGNOSIS — C9000 Multiple myeloma not having achieved remission: Secondary | ICD-10-CM | POA: Diagnosis not present

## 2024-10-15 DIAGNOSIS — R6889 Other general symptoms and signs: Secondary | ICD-10-CM | POA: Insufficient documentation

## 2024-10-15 DIAGNOSIS — Z888 Allergy status to other drugs, medicaments and biological substances status: Secondary | ICD-10-CM | POA: Diagnosis not present

## 2024-10-15 DIAGNOSIS — Z8249 Family history of ischemic heart disease and other diseases of the circulatory system: Secondary | ICD-10-CM | POA: Insufficient documentation

## 2024-10-15 DIAGNOSIS — Z7189 Other specified counseling: Secondary | ICD-10-CM

## 2024-10-15 DIAGNOSIS — M810 Age-related osteoporosis without current pathological fracture: Secondary | ICD-10-CM | POA: Insufficient documentation

## 2024-10-15 DIAGNOSIS — Z79899 Other long term (current) drug therapy: Secondary | ICD-10-CM | POA: Insufficient documentation

## 2024-10-15 DIAGNOSIS — K449 Diaphragmatic hernia without obstruction or gangrene: Secondary | ICD-10-CM | POA: Insufficient documentation

## 2024-10-15 DIAGNOSIS — Z8542 Personal history of malignant neoplasm of other parts of uterus: Secondary | ICD-10-CM | POA: Insufficient documentation

## 2024-10-15 DIAGNOSIS — Z9841 Cataract extraction status, right eye: Secondary | ICD-10-CM | POA: Insufficient documentation

## 2024-10-15 DIAGNOSIS — Z90710 Acquired absence of both cervix and uterus: Secondary | ICD-10-CM | POA: Diagnosis not present

## 2024-10-15 DIAGNOSIS — Z8049 Family history of malignant neoplasm of other genital organs: Secondary | ICD-10-CM | POA: Insufficient documentation

## 2024-10-15 DIAGNOSIS — K5730 Diverticulosis of large intestine without perforation or abscess without bleeding: Secondary | ICD-10-CM | POA: Insufficient documentation

## 2024-10-15 DIAGNOSIS — Z885 Allergy status to narcotic agent status: Secondary | ICD-10-CM | POA: Diagnosis not present

## 2024-10-15 DIAGNOSIS — D649 Anemia, unspecified: Secondary | ICD-10-CM | POA: Diagnosis not present

## 2024-10-15 DIAGNOSIS — K8020 Calculus of gallbladder without cholecystitis without obstruction: Secondary | ICD-10-CM | POA: Insufficient documentation

## 2024-10-15 DIAGNOSIS — M549 Dorsalgia, unspecified: Secondary | ICD-10-CM | POA: Insufficient documentation

## 2024-10-15 DIAGNOSIS — L299 Pruritus, unspecified: Secondary | ICD-10-CM | POA: Diagnosis not present

## 2024-10-15 DIAGNOSIS — Z7901 Long term (current) use of anticoagulants: Secondary | ICD-10-CM | POA: Insufficient documentation

## 2024-10-15 DIAGNOSIS — I4891 Unspecified atrial fibrillation: Secondary | ICD-10-CM | POA: Insufficient documentation

## 2024-10-15 DIAGNOSIS — Z8262 Family history of osteoporosis: Secondary | ICD-10-CM | POA: Insufficient documentation

## 2024-10-15 DIAGNOSIS — Z8042 Family history of malignant neoplasm of prostate: Secondary | ICD-10-CM | POA: Insufficient documentation

## 2024-10-15 DIAGNOSIS — C541 Malignant neoplasm of endometrium: Secondary | ICD-10-CM | POA: Insufficient documentation

## 2024-10-15 DIAGNOSIS — Z87442 Personal history of urinary calculi: Secondary | ICD-10-CM | POA: Diagnosis not present

## 2024-10-15 DIAGNOSIS — Z823 Family history of stroke: Secondary | ICD-10-CM | POA: Insufficient documentation

## 2024-10-15 LAB — CBC WITH DIFFERENTIAL (CANCER CENTER ONLY)
Abs Immature Granulocytes: 0.01 K/uL (ref 0.00–0.07)
Basophils Absolute: 0.1 K/uL (ref 0.0–0.1)
Basophils Relative: 1 %
Eosinophils Absolute: 0.3 K/uL (ref 0.0–0.5)
Eosinophils Relative: 5 %
HCT: 36 % (ref 36.0–46.0)
Hemoglobin: 11.8 g/dL — ABNORMAL LOW (ref 12.0–15.0)
Immature Granulocytes: 0 %
Lymphocytes Relative: 15 %
Lymphs Abs: 0.7 K/uL (ref 0.7–4.0)
MCH: 29.6 pg (ref 26.0–34.0)
MCHC: 32.8 g/dL (ref 30.0–36.0)
MCV: 90.2 fL (ref 80.0–100.0)
Monocytes Absolute: 0.5 K/uL (ref 0.1–1.0)
Monocytes Relative: 10 %
Neutro Abs: 3.3 K/uL (ref 1.7–7.7)
Neutrophils Relative %: 69 %
Platelet Count: 198 K/uL (ref 150–400)
RBC: 3.99 MIL/uL (ref 3.87–5.11)
RDW: 14.2 % (ref 11.5–15.5)
WBC Count: 4.8 K/uL (ref 4.0–10.5)
nRBC: 0 % (ref 0.0–0.2)

## 2024-10-15 LAB — CMP (CANCER CENTER ONLY)
ALT: 25 U/L (ref 0–44)
AST: 27 U/L (ref 15–41)
Albumin: 4.7 g/dL (ref 3.5–5.0)
Alkaline Phosphatase: 51 U/L (ref 38–126)
Anion gap: 11 (ref 5–15)
BUN: 12 mg/dL (ref 8–23)
CO2: 27 mmol/L (ref 22–32)
Calcium: 9.2 mg/dL (ref 8.9–10.3)
Chloride: 105 mmol/L (ref 98–111)
Creatinine: 0.85 mg/dL (ref 0.44–1.00)
GFR, Estimated: >60 mL/min (ref >60)
Glucose, Bld: 92 mg/dL (ref 70–99)
Potassium: 3.4 mmol/L — ABNORMAL LOW (ref 3.5–5.1)
Sodium: 142 mmol/L (ref 135–145)
Total Bilirubin: 0.5 mg/dL (ref 0.0–1.2)
Total Protein: 7.2 g/dL (ref 6.5–8.1)

## 2024-10-15 NOTE — Progress Notes (Shared)
 HEMATOLOGY ONCOLOGY PROGRESS NOTE  Date of service: 10/15/2024  Patient Care Team: Larnell Hamilton, MD as PCP - General (Internal Medicine) Lynwood Schilling, MD as PCP - Cardiology (Cardiology) Cindie Ole DASEN, MD as PCP - Electrophysiology (Cardiology) Danielle Rom, MD as Consulting Physician (Obstetrics and Gynecology) Key, Hargis HERO, NP as Nurse Practitioner (Gynecology) Lynwood Schilling, MD as Consulting Physician (Cardiology) Onesimo Emaline Brink, MD as Consulting Physician (Hematology)  CHIEF COMPLAINT/PURPOSE OF CONSULTATION: Follow-up for continued evaluation and management of active multiple myeloma.   HISTORY OF PRESENTING ILLNESS:  (08/04/2023) Mary Potter is a wonderful 78 y.o. 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   Today, she is accompanied by her daughter and son-in-law. Patient has had two vertebral compression fractures in her L-spine. She reports that her initial back fracture was from pulling too hard while adjusting her shoes, and the second occurred after pushing on a trash can lid.    Her back pain continues to be bothersome, though it has improved. She did previously have mild pain raidating to her left lower extremity, which has resolved. Back brace does improve back pain.   The first time she had a compression fracture, she was told that she was developing the beginnings of osteopenia. Patient reports that she did not tolerate Fosamax and she stopped taking it. She has not had a bone density study recently. Her last bone density study from 2018 showed osteopenia. Patient has never been on prolia  previously, though there are considerations to start it soon.    She reports having a muscle spasm on her left side, which is settling with muscle relaxants. She denies any leg swelling.    Patient reports a history of kidney stones, during which she had protein in her  urine.   Patient has regularly been on vitamin D . She has been off of calcium  for several years ago because of her fhx of heart disease.   SUMMARY OF ONCOLOGIC HISTORY: Oncology History Overview Note  MMR IHC intact MSI stable   Endometrial adenocarcinoma (HCC)  02/10/2021 Imaging   Pelvic ultrasound: thickened endometrium with slight vascularity at the fundus.  The lining measured 13.5 mm.     02/16/2021 Initial Biopsy   EMB: [facility]   03/17/2021 Surgery   Robotic-assisted laparoscopic total hysterectomy with bilateral salpingoophorectomy, SLN biopsy   Findings: On EUA, small mobile uterus. Normal upper abdominal survey on intra-abdominal entry. Normal omentum, small and large bowel. 6-8 cm uterus, normal appearing. Normal bilateral adnexa. Mapping successful to bilateral SLNs. No intra-abdominal or pelvic evidence of disease.   03/17/2021 Pathology Results   A. SENTINEL LYMPH NODE, RIGHT EXTERNAL ILIAC, EXCISION:  - One lymph node negative for metastatic carcinoma (0/1).   B. SENTINEL LYMPH NODE, LEFT OBTURATOR, EXCISION:  - One lymph node negative for metastatic carcinoma (0/1).   C. 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      Focal superficial myometrial invasion.      No lymphovascular invasion.      Cervix, bilateral ovaries and bilateral fallopian tubes negative for  carcinoma.   ONCOLOGY TABLE:  UTERUS, CARCINOMA OR CARCINOSARCOMA: Resection  Procedure: Total hysterectomy, bilateral salpingo-oophorectomy with  right and left sentinel lymph nodes.  Histologic Type: Endometrioid adenocarcinoma.  Histologic Grade: FIGO grade 1.  Myometrial Invasion:       Depth of Myometrial Invasion:  2 mm.       Myometrial Thickness (mm): 25.       Percentage of Myometrial Invasion: 8%.  Uterine Serosa Involvement: Not identified.  Cervical stromal Involvement: Not identified.   Extent of involvement of other tissue/organs: Not identified.  Lymphovascular Invasion: Not identified.  Regional Lymph Nodes:       Pelvic Lymph Nodes Examined: 2                                      Sentinel: 2                                      Non-sentinel: 0                                      Total: 2       Lymph Nodes with Metastasis: 0  Pathologic Stage Classification (pTNM, AJCC 8th Edition): pT1a, pN0  Ancillary Studies: MMR and MSI testing have been ordered.  Representative Tumor Block: C3-C9.  Comment(s): Cytokeratin AE1/AE3 performed on the sentinel lymph nodes  (parts A and B) is negative for metastatic carcinoma.  (v4.2.0.1)    03/20/2021 Initial Diagnosis   Endometrial adenocarcinoma (HCC)   Multiple myeloma not having achieved remission (HCC)  06/10/2024 Initial Diagnosis   Multiple myeloma not having achieved remission (HCC)   06/22/2024 -  Chemotherapy   Patient is on Treatment Plan : MYELOMA NEWLY DIAGNOSED TRANSPLANT CANDIDATE DaraVRd (Daratumumab  SQ) q21d x 6 Cycles (Induction/Consolidation)       INTERVAL HISTORY: Mary Potter is a 78 y.o. female who is here today for continued evaluation and management of active multiple myeloma. . {ELcompanions/ambulations (Optional):33762}  she was last seen by me on 10/07/2024; at the time she mentioned experiencing aches on her left side.   Today, she states that she is experiencing hair loss and bloating.   She states that she is doing well with the treatment medication, but she still has dry eyes. She will start using eye drops to combat this.  She does mention she is quite itchy but does not have a rash, likely meaning it's just dry skin do to the weather.   She denies abnormal diarrhea, bone pains, dental issues, spinal pain, or leg swelling.   REVIEW OF SYSTEMS:   10 Point review of systems of done and is negative except as noted above.  MEDICAL HISTORY Past Medical History:  Diagnosis Date   Atrial  fibrillation (HCC)    Dysrhythmia    A-fib   Endometrial hyperplasia 01/03/2015   GERD (gastroesophageal reflux disease)    History of kidney stones    Hyperlipidemia    Hypertension    Pneumonia    PONV (postoperative nausea and vomiting)    slow to wake up   Thickened endometrium 01/03/2015   Vaginal delivery 1976, 1985    SURGICAL HISTORY Past Surgical History:  Procedure Laterality Date   ABDOMINAL HYSTERECTOMY     ANKLE FRACTURE SURGERY     ATRIAL FIBRILLATION ABLATION N/A 02/08/2022   Procedure: ATRIAL FIBRILLATION ABLATION;  Surgeon: Cindie Ole DASEN, MD;  Location: [facility];  Service: Cardiovascular;  Laterality: N/A;   CARDIOVERSION N/A 02/17/2023   Procedure: CARDIOVERSION;  Surgeon: Sheena,  Kardie, DO;  Location: [facility];  Service: Cardiovascular;  Laterality: N/A;   CATARACT EXTRACTION Right 2020   DILATION AND CURETTAGE OF UTERUS  2012   ablation    EYE SURGERY     Corneal growth removal on right eye   HYSTEROSCOPY WITH D & C N/A 01/03/2015   Procedure: DILATATION AND CURETTAGE /HYSTEROSCOPY;  Surgeon: Robbi Render, MD;  Location: [facility];  Service: Gynecology;  Laterality: N/A;   LAPAROSCOPY     for endometriosis   ROBOTIC ASSISTED TOTAL HYSTERECTOMY WITH BILATERAL SALPINGO OOPHERECTOMY N/A 03/17/2021   Procedure: XI ROBOTIC ASSISTED TOTAL HYSTERECTOMY WITH BILATERAL SALPINGO OOPHORECTOMY;  Surgeon: Viktoria Comer SAUNDERS, MD;  Location: [facility];  Service: Gynecology;  Laterality: N/A;   SENTINEL NODE BIOPSY N/A 03/17/2021   Procedure: SENTINEL NODE BIOPSY;  Surgeon: Viktoria Comer SAUNDERS, MD;  Location: [facility];  Service: Gynecology;  Laterality: N/A;   TONSILLECTOMY AND ADENOIDECTOMY      SOCIAL HISTORY Social History[1]  Social History   Social History Narrative   Lives with her younger daughter, Brittany. Her older daughter lives in [place] with her family.    SOCIAL DRIVERS OF HEALTH SDOH Screenings   Food Insecurity: No Food  Insecurity (06/19/2024)  Housing: Low Risk (06/19/2024)  Transportation Needs: No Transportation Needs (06/19/2024)  Utilities: Not At Risk (06/19/2024)  Depression (PHQ2-9): Low Risk (09/14/2024)  Tobacco Use: Low Risk (05/14/2024)     FAMILY HISTORY Family History  Problem Relation Age of Onset   Heart disease Mother 51       CABG   Stroke Father    Prostate cancer Father    Heart disease Brother 13       Transplant, died 10 years   Heart disease Maternal Grandmother    Heart disease Paternal Grandmother    Cancer Paternal Grandfather    Heart disease Brother 72       RF   Leukemia Brother    Leukemia Brother    Hypertension Sister    Cervical cancer Sister    Colon cancer Neg Hx    Breast cancer Neg Hx    Ovarian cancer Neg Hx    Endometrial cancer Neg Hx    Pancreatic cancer Neg Hx      ALLERGIES: is allergic to talwin [pentazocine], benadryl  [diphenhydramine ], hydrocodone, and prednisone .  MEDICATIONS  Current Outpatient Medications  Medication Sig Dispense Refill   acetaminophen  (TYLENOL ) 500 MG tablet Take 1,000 mg by mouth every 6 (six) hours as needed (Arthritis Pain).     acyclovir  (ZOVIRAX ) 400 MG tablet Take 1 tablet (400 mg total) by mouth 2 (two) times daily. 60 tablet 5   ALPRAZolam (XANAX) 0.5 MG tablet Take 0.25 mg by mouth daily as needed (anxiousness).     apixaban  (ELIQUIS ) 5 MG TABS tablet Take 1 tablet (5 mg total) by mouth 2 (two) times daily. 180 tablet 3   b complex vitamins capsule Take 1 capsule by mouth daily.     Calcium  Carb-Cholecalciferol 600-3.125 MG-MCG TABS      Cholecalciferol (VITAMIN D ) 50 MCG (2000 UT) CAPS Take 2,000 Units by mouth at bedtime.     dexamethasone  (DECADRON ) 4 MG tablet Take 5 tabs (20 mg) weekly the day after daratumumab  for 12 weeks. Take with breakfast. 20 tablet 5   esomeprazole  (NEXIUM ) 40 MG capsule TAKE 1 CAPSULE BY MOUTH EVERY DAY BEFORE BREAKFAST 30 capsule 2   estradiol  (VIVELLE -DOT) 0.025 MG/24HR PLACE 1  PATCH ONTO THE SKIN 2 (  TWO) TIMES A WEEK. SUNDAYS & WEDNESDAYS 8 patch 0   lenalidomide  (REVLIMID ) 10 MG capsule Take 1 capsule (10 mg total) by mouth daily. Take 1 capsule (10 mg total) by mouth daily for 21 days. Take 7 days off. Repeat cycle. 21 capsule 0   metoprolol  tartrate (LOPRESSOR ) 50 MG tablet TAKE ONE TABLET BY MOUTH TWICE A DAY 180 tablet 3   olmesartan (BENICAR) 40 MG tablet Take 40 mg by mouth daily.     ondansetron  (ZOFRAN ) 8 MG tablet Take 1 tablet (8 mg total) by mouth every 8 (eight) hours as needed for nausea or vomiting. 30 tablet 1   Polyethyl Glycol-Propyl Glycol (SYSTANE OP) Place 1 drop into both eyes daily.     prochlorperazine  (COMPAZINE ) 10 MG tablet Take 1 tablet (10 mg total) by mouth every 6 (six) hours as needed for nausea or vomiting. 30 tablet 1   rosuvastatin  (CRESTOR ) 20 MG tablet Take 1 tablet (20 mg total) by mouth every evening. 90 tablet 3   No current facility-administered medications for this visit.    PHYSICAL EXAMINATION: ECOG PERFORMANCE STATUS: 0 - Asymptomatic VITALS: There were no vitals filed for this visit. There were no vitals filed for this visit. There is no height or weight on file to calculate BMI.  GENERAL: alert, in no acute distress and comfortable SKIN: no acute rashes, no significant lesions EYES: conjunctiva are pink and non-injected, sclera anicteric OROPHARYNX: MMM, no exudates, no oropharyngeal erythema or ulceration NECK: supple, no JVD LYMPH:  no palpable lymphadenopathy in the cervical, axillary or inguinal regions LUNGS: clear to auscultation b/l with normal respiratory effort HEART: regular rate & rhythm ABDOMEN:  normoactive bowel sounds , non tender, not distended, no hepatosplenomegaly Extremity: no pedal edema PSYCH: alert & oriented x 3 with fluent speech NEURO: no focal motor/sensory deficits  LABORATORY DATA:   I have reviewed the data as listed     Latest Ref Rng & Units 10/15/2024    1:08 PM 09/21/2024    10:49 AM 09/14/2024   10:03 AM  CBC EXTENDED  WBC 4.0 - 10.5 K/uL 4.8  6.5  6.9   RBC 3.87 - 5.11 MIL/uL 3.99  3.52  3.93   Hemoglobin 12.0 - 15.0 g/dL 88.1  89.5  88.1   HCT 36.0 - 46.0 % 36.0  32.3  35.9   Platelets 150 - 400 K/uL 198  162  183   NEUT# 1.7 - 7.7 K/uL 3.3  4.9  5.2   Lymph# 0.7 - 4.0 K/uL 0.7  1.0  1.2         Latest Ref Rng & Units 10/15/2024    1:08 PM 09/21/2024   10:49 AM 09/14/2024   10:03 AM  CMP  Glucose 70 - 99 mg/dL 92  881  899   BUN 8 - 23 mg/dL 12  23  20    Creatinine 0.44 - 1.00 mg/dL 9.14  9.18  9.15   Sodium 135 - 145 mmol/L 142  140  140   Potassium 3.5 - 5.1 mmol/L 3.4  4.5  4.3   Chloride 98 - 111 mmol/L 105  104  104   CO2 22 - 32 mmol/L 27  28  29    Calcium  8.9 - 10.3 mg/dL 9.2  9.4  9.4   Total Protein 6.5 - 8.1 g/dL 7.2  6.7  6.8   Total Bilirubin 0.0 - 1.2 mg/dL 0.5  0.4  0.5   Alkaline Phos 38 - 126 U/L 51  43  41   AST 15 - 41 U/L 27  21  17    ALT 0 - 44 U/L 25  17  14     Multiple Myeloma Panel Component     Latest Ref Rng 09/14/2024  IgG (Immunoglobin G), Serum     586 - 1,602 mg/dL 385   IgA     64 - 577 mg/dL 15 (L)   IgM (Immunoglobulin M), Srm     26 - 217 mg/dL 16 (L)   Total Protein ELP     6.0 - 8.5 g/dL 6.6 (C)  Albumin SerPl Elph-Mcnc     2.9 - 4.4 g/dL 3.6 (C)  Alpha 1     0.0 - 0.4 g/dL 0.3 (C)  Alpha2 Glob SerPl Elph-Mcnc     0.4 - 1.0 g/dL 1.1 (H) (C)  B-Globulin SerPl Elph-Mcnc     0.7 - 1.3 g/dL 1.2 (C)  Gamma Glob SerPl Elph-Mcnc     0.4 - 1.8 g/dL 0.5 (C)  M Protein SerPl Elph-Mcnc     Not Observed g/dL 0.2 (H) (C)  Globulin, Total     2.2 - 3.9 g/dL 3.0 (C)  Albumin/Glob SerPl     0.7 - 1.7  1.3 (C)  IFE 1 Comment ! (C)  Please Note (HCV): Comment (C)    Legend: (L) Low (H) High ! Abnormal (C) Corrected  Kappa/Lambda Light Chains Component     Latest Ref Rng 09/14/2024  Kappa free light chain     3.3 - 19.4 mg/L 4.8   Lambda free light chains     5.7 - 26.3 mg/L 2.4 (L)    Kappa, lambda light chain ratio     0.26 - 1.65  2.00 (H)     Legend: (L) Low (H) High     IFE/PE, serum 07/21/2023:  05/14/2024 Surgical Pathology   Bone Marrow Report    Clinical History: MYELOMA    DIAGNOSIS:    BONE MARROW, ASPIRATE, CLOT, CORE:  - Hypercellular bone marrow (60%) involved by plasma cell neoplasm (63%  plasma cells by manual aspirate differential, approximately 70% by CD138  immunohistochemical analysis, and kappa monotypic by kappa/lambda in  situ hybridization).  See comment.    PERIPHERAL BLOOD:  - Normocytic normochromic anemia    COMMENT:    Correlation with clinical findings, radiographic skeletal survey,  myeloma panel and other laboratory tests (SPEP, UPEP, beta  microglobulin, etc.) is recommended for a complete assessment and  classification of this case.     05/18/2024   RADIOGRAPHIC STUDIES: I have personally reviewed the radiological images as listed and agreed with the findings in the report.  10/03/2023 DG BONE DENSITY (DXA)   IMPRESSION: Referring Physician:  GLENDIA FREEMAN Your patient completed a bone mineral density test using GE Lunar iDXA system (analysis version: 16). Technologist: ALW PATIENT: Name: Brittie, Whisnant Patient ID: [id number] Birth Date: 1945-11-25 Height: 63.0 in. Sex: Female Measured: 10/03/2023 Weight: 156.4 lbs. Indications: Advanced Age, Bilateral Ovariectomy, Caucasian, Estrogen Deficiency, Family Hist. (Parent hip fracture), Family History of Osteoporosis, History of Fracture (Adult), Hysterectomy, Nexium , Post Menopausal Fractures: Vertebra Treatments: Estrogen Patch, Vitamin D , Prolia    ASSESSMENT: The BMD measured at Left Forearm Radius 33% is 0.684 g/cm2 with a T-score of -2.2. This patient is considered to have osteopenia/low bone mass according to World Health Organization [facility]) criteria.   The scan quality is good. Lumbar Spine excluded due to degenerative changes. No FRAX due to patient  taking Prolia    Site  Region Measured Date Measured Age YA BMD Significant CHANGE T-score DualFemur Neck Left 10/03/2023 76.7 -2.2 0.734 g/cm2   Left Forearm Radius 33% 10/03/2023 76.7 -2.2 0.684 g/cm2   DualFemur Total Mean 10/03/2023 76.7 -1.7 0.788 g/cm2   World Health Organization [facility]) criteria for post-menopausal, Caucasian Women: Normal       T-score at or above -1 SD Osteopenia   T-score between -1 and -2.5 SD Osteoporosis T-score at or below -2.5 SD   RECOMMENDATION: 1. All patients should optimize calcium  and vitamin D  intake. 2. Consider FDA approved medical therapies in postmenopausal women and men aged 48 years and older, based on the following: a. A hip or vertebral (clinical or morphometric) fracture b. T-score = -2.5 at the femoral neck or spine after appropriate evaluation to exclude secondary causes c. Low bone mass (T-score between -1.0 and -2.5 at the femoral neck or spine) and a 10- year probability of a hip fracture = 3% or a 10 year probability of a major osteoporosis-related fracture = 20% based on the [place] -adapted WHO algorithm. 3. Clinician judgement and/or patient preference may indicate treatment for people with10-year fracture probabilities above or below these levels.   FOLLOW-UP: Patients with diagnosis of osteoporosis or at high risk for fracture should have regular bone mineral density tests . For patients eligible for Medicare routine testing is allowed once every 2 years. The testing frequency can be increased to one year for patients who have rapidly progressing disease, those who are receiving or discontinuing medical therapy to restore bone mass, or have additional risk factors.   I have reviewed this study and agree with the findings. [facility]   Electronically Signed   By: Norman Hopper M.D.   On: 10/03/2023 13:06    04/30/2024 NM PET Image Initial (PI) Skull Base To Thigh  CLINICAL DATA:  Initial treatment strategy  for myeloma.    TECHNIQUE: 8.09 mCi F-18 FDG was injected intravenously. Full-ring PET imaging was performed from the skull base to thigh after the radiotracer. CT data was obtained and used for attenuation correction and anatomic localization.   Fasting blood glucose: 116 mg/dl   COMPARISON:  Lumbar spine CT 08/19/2023. Abdomen pelvis CT with contrast 09/12/2020. skeletal bone survey 08/15/2023.   FINDINGS: Mediastinal blood pool activity: SUV max 2.7   Liver activity: SUV max 3.1   NECK: No specific abnormal uptake identified in the neck including along lymph node change of the submandibular, posterior triangle or internal jugular region. Near symmetric uptake of the visualized intracranial compartment.   Incidental CT findings: The parotid glands, submandibular glands and thyroid  gland are unremarkable. Significant streak artifact related to the patient's dental hardware. Paranasal sinuses and mastoid air cells are clear. Right middle turbinate concha bullosa.   CHEST: No specific abnormal uptake identified above blood pool in the axillary regions, hilum or mediastinum. No abnormal lung parenchymal uptake.   Incidental CT findings: Small hiatal hernia. Slightly patulous thoracic esophagus. No pericardial effusion. Scattered vascular calcifications are seen including along the coronary arteries. The thoracic aorta has a normal course and caliber. Scattered calcified plaque. Breathing motion seen throughout the examination with some mild scattered ground-glass identified along the lower lung zones, nonspecific. No consolidation, pneumothorax or effusion. There is some middle lobe calcified nodules identified on image 72 of the CT scan, series 4.   ABDOMEN/PELVIS: There is physiologic distribution radiotracer along the parenchymal organs, bowel and renal collecting systems. Specifically no abnormal uptake along the spleen. Splenic length approaches 10.4  cm. No abnormal  uptake along lymph node chains in the abdomen and pelvis.   Incidental CT findings: On this limited noncontrast examination, the liver, adrenal glands unremarkable. There is some fatty atrophy of the pancreas. Stone in the gallbladder. No abnormal calcifications seen within either kidney nor along the course of either ureter. Underdistended urinary bladder. Large bowel has a normal course and caliber. Left-sided colonic diverticula. Scattered colonic stool. Stomach and small bowel are nondilated.   SKELETON: There is some mild uptake seen in the spine in the area of compression deformity non additions cement at T12 through L2. There is some minimal uptake along the proximal right humeral metaphyseal shaft with maximum SUV of 3.0. No erosive changes along the bone in this location. Nonspecific. No additional areas of abnormal uptake along the visualized osseous structures.   Incidental CT findings: Diffuse degenerative changes identified.   IMPRESSION: No specific abnormal radiotracer uptake identified. This includes along lymph node chains, spleen. No splenomegaly.   Foley slight asymmetric uptake along the proximal right humeral shaft of maximum SUV of 3.0, nonspecific. This is in a different location than the lucent lesion seen on the bone survey October 2024.   Augmentation cement seen at the spine at T12 through L2. Colonic diverticula. Gallstone. Hiatal hernia.     Electronically Signed   By: Ranell Bring M.D.   On: 05/01/2024 16:57      ASSESSMENT & PLAN:  78 y.o. 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 PET/CT 04/30/2024 nonspecific right humeral shaft uptake.  No definitive bone lesions. Bone marrow biopsy shows 70% kappa restricted plasma cells consistent with active myeloma based on the presence of more than 60% involvement with plasma cell neoplasm and also based on light chain criteria. - Molecular cytogenetics show 13 q. deletion  suggesting standard risk myeloma. Pretreatment serology show IgA level of 1886 and an M spike of 1 g/dL Kappa free light chains of 265 with a kappa lambda ratio of 106 --Started Dara/Velcade /Dex on 06/22/2024   2. History of A-fib and is on anticoagulation.  No previous history of MI or CVA or VTE.   3. History of hiatal hernia  PLAN: - Discussed lab results on 10/15/2024 in detail with patient: - CMP   - Creatinine: 0.85  - Calcium : 9.2 - CBC   - WBC: 4.8  - Hemoglobin: 11.8  - Platelets: 198  - Multiple Myeloma and Kappa/Lambda light chains are pending - Discussed beginning to eat potassium rich foods.  - Switching Xgeva  to every other month - RTC with Dr Onesimo in 9 weeks with appointment for Xgeva   FOLLOW-UP in 2 months for labs and follow-up with Dr. Onesimo in 9 weeks.  The total time spent in the appointment was *** minutes* .  All of the patient's questions were answered and the patient knows to call the clinic with any problems, questions, or concerns.  Emaline Onesimo MD MS AAHIVMS [facility] Hematology/Oncology Physician [facility]  *Total Encounter Time as defined by the Centers for Medicare and Medicaid Services includes, in addition to the face-to-face time of a patient visit (documented in the note above) non-face-to-face time: obtaining and reviewing outside history, ordering and reviewing medications, tests or procedures, care coordination (communications with other health care professionals or caregivers) and documentation in the medical record.  I, Marijo Sharps, acting as a neurosurgeon for Emaline Onesimo, MD.,have documented all relevant documentation on the behalf of Emaline Onesimo, MD,as directed by  Gautam  Onesimo, MD while in the presence of Emaline Onesimo, MD.  I have reviewed the above documentation for accuracy and completeness, and I agree with the above.  Emaline Onesimo, MD      [1]  Social History Tobacco Use   Smoking status: Never    Passive exposure: Past    Smokeless tobacco: Never   Tobacco comments:    Never smoke 03/02/23  Vaping Use   Vaping status: Never Used  Substance Use Topics   Alcohol  use: No    Alcohol /week: 0.0 standard drinks of alcohol    Drug use: No

## 2024-10-16 ENCOUNTER — Telehealth: Payer: Self-pay | Admitting: Hematology

## 2024-10-16 LAB — KAPPA/LAMBDA LIGHT CHAINS
Kappa free light chain: 5.9 mg/L (ref 3.3–19.4)
Kappa, lambda light chain ratio: 2.46 — ABNORMAL HIGH (ref 0.26–1.65)
Lambda free light chains: 2.4 mg/L — ABNORMAL LOW (ref 5.7–26.3)

## 2024-10-16 NOTE — Telephone Encounter (Signed)
 Scheduled patient for next appointments. Called and left a voicemail with the details.

## 2024-10-18 LAB — MULTIPLE MYELOMA PANEL, SERUM
Albumin SerPl Elph-Mcnc: 3.9 g/dL (ref 2.9–4.4)
Albumin/Glob SerPl: 1.5 (ref 0.7–1.7)
Alpha 1: 0.3 g/dL (ref 0.0–0.4)
Alpha2 Glob SerPl Elph-Mcnc: 0.9 g/dL (ref 0.4–1.0)
B-Globulin SerPl Elph-Mcnc: 1.1 g/dL (ref 0.7–1.3)
Gamma Glob SerPl Elph-Mcnc: 0.5 g/dL (ref 0.4–1.8)
Globulin, Total: 2.7 g/dL (ref 2.2–3.9)
IgA: 17 mg/dL — ABNORMAL LOW (ref 64–422)
IgG (Immunoglobin G), Serum: 536 mg/dL — ABNORMAL LOW (ref 586–1602)
IgM (Immunoglobulin M), Srm: 17 mg/dL — ABNORMAL LOW (ref 26–217)
M Protein SerPl Elph-Mcnc: 0.2 g/dL — ABNORMAL HIGH
Total Protein ELP: 6.6 g/dL (ref 6.0–8.5)

## 2024-10-21 ENCOUNTER — Encounter: Payer: Self-pay | Admitting: Hematology

## 2024-11-09 ENCOUNTER — Other Ambulatory Visit: Payer: Self-pay

## 2024-11-09 DIAGNOSIS — C9000 Multiple myeloma not having achieved remission: Secondary | ICD-10-CM

## 2024-11-09 MED ORDER — LENALIDOMIDE 10 MG PO CAPS: 10.0000 mg | ORAL_CAPSULE | Freq: Every day | ORAL | 0 refills | Status: AC

## 2024-11-12 NOTE — Progress Notes (Unsigned)
 Returned call to patient. Pt stated that she has developed a generalized rash to the majority of her body including her head. Pt states it has been itchy and taking an allergy pill has helped. Pt states she has also had chills and leg aches. Pt was questioning if it was caused by her Revlimid . Per Dr Onesimo, since the pt has been on Revlimid  for a while this more than likely caused by something else. Pt encouraged to contact PCP to rule out something viral. Pt acknowledged information and verbalized understanding.

## 2024-11-15 ENCOUNTER — Other Ambulatory Visit: Payer: Self-pay

## 2024-11-15 MED ORDER — PREDNISONE 20 MG PO TABS: 20.0000 mg | ORAL_TABLET | Freq: Every day | ORAL | 0 refills | Status: DC

## 2024-11-15 NOTE — Progress Notes (Signed)
 Returned call to patient regarding generalized rash. Pt states she has a rash from head to toe. Pt has seen PCP who ruled out anything viral. Pt negative for COVID and flu. Pt has no other symptoms other than the rash. Pt to hold Revlimid  until she is seen in the office next week. Pt to take Prednisone  20 mg po X 10 days. Pt already taking allergy pill daily and Pepcid  20 mg BID. Pt also seeing Dermatology today.

## 2024-11-22 ENCOUNTER — Inpatient Hospital Stay: Attending: Hematology | Admitting: Hematology

## 2024-11-22 VITALS — BP 164/72 | HR 62 | Temp 98.3°F | Resp 18 | Wt 162.3 lb

## 2024-11-22 DIAGNOSIS — Z7901 Long term (current) use of anticoagulants: Secondary | ICD-10-CM | POA: Diagnosis not present

## 2024-11-22 DIAGNOSIS — Z8542 Personal history of malignant neoplasm of other parts of uterus: Secondary | ICD-10-CM | POA: Insufficient documentation

## 2024-11-22 DIAGNOSIS — Z79624 Long term (current) use of inhibitors of nucleotide synthesis: Secondary | ICD-10-CM | POA: Insufficient documentation

## 2024-11-22 DIAGNOSIS — Z7961 Long term (current) use of immunomodulator: Secondary | ICD-10-CM | POA: Diagnosis not present

## 2024-11-22 DIAGNOSIS — I10 Essential (primary) hypertension: Secondary | ICD-10-CM | POA: Insufficient documentation

## 2024-11-22 DIAGNOSIS — C9000 Multiple myeloma not having achieved remission: Secondary | ICD-10-CM | POA: Diagnosis not present

## 2024-11-22 DIAGNOSIS — E785 Hyperlipidemia, unspecified: Secondary | ICD-10-CM | POA: Insufficient documentation

## 2024-11-22 DIAGNOSIS — Z90722 Acquired absence of ovaries, bilateral: Secondary | ICD-10-CM | POA: Insufficient documentation

## 2024-11-22 DIAGNOSIS — I4891 Unspecified atrial fibrillation: Secondary | ICD-10-CM | POA: Diagnosis not present

## 2024-11-22 DIAGNOSIS — D649 Anemia, unspecified: Secondary | ICD-10-CM | POA: Insufficient documentation

## 2024-11-22 DIAGNOSIS — Z90710 Acquired absence of both cervix and uterus: Secondary | ICD-10-CM | POA: Diagnosis not present

## 2024-11-22 DIAGNOSIS — Z7952 Long term (current) use of systemic steroids: Secondary | ICD-10-CM | POA: Diagnosis not present

## 2024-11-22 DIAGNOSIS — Z79899 Other long term (current) drug therapy: Secondary | ICD-10-CM | POA: Diagnosis not present

## 2024-11-22 MED ORDER — PREDNISONE 5 MG PO TABS: ORAL_TABLET | ORAL | 0 refills | Status: AC

## 2024-11-22 NOTE — Progress Notes (Signed)
 " HEMATOLOGY ONCOLOGY PROGRESS NOTE  Date of service: 11/22/2024  Patient Care Team: Larnell Hamilton, MD as PCP - General (Internal Medicine) Lavona Agent, MD as PCP - Cardiology (Cardiology) Cindie Ole DASEN, MD (Inactive) as PCP - Electrophysiology (Cardiology) Danielle Rom, MD as Consulting Physician (Obstetrics and Gynecology) Key, Hargis HERO, NP as Nurse Practitioner (Gynecology) Lavona Agent, MD as Consulting Physician (Cardiology) Onesimo Emaline Brink, MD as Consulting Physician (Hematology)  CHIEF COMPLAINT/PURPOSE OF CONSULTATION: Follow-up for continued evaluation and management of active multiple myeloma.  HISTORY OF PRESENTING ILLNESS: (08/04/2023) Mary Potter is a wonderful 79 y.o. 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   Today, she is accompanied by her daughter and son-in-law. Patient has had two vertebral compression fractures in her L-spine. She reports that her initial back fracture was from pulling too hard while adjusting her shoes, and the second occurred after pushing on a trash can lid.    Her back pain continues to be bothersome, though it has improved. She did previously have mild pain raidating to her left lower extremity, which has resolved. Back brace does improve back pain.   The first time she had a compression fracture, she was told that she was developing the beginnings of osteopenia. Patient reports that she did not tolerate Fosamax and she stopped taking it. She has not had a bone density study recently. Her last bone density study from 2018 showed osteopenia. Patient has never been on prolia  previously, though there are considerations to start it soon.    She reports having a muscle spasm on her left side, which is settling with muscle relaxants. She denies any leg swelling.    Patient reports a history of kidney stones, during which she had protein in  her urine.   Patient has regularly been on vitamin D . She has been off of calcium  for several years ago because of her fhx of heart disease.    SUMMARY OF ONCOLOGIC HISTORY: Oncology History Overview Note  MMR IHC intact MSI stable   Endometrial adenocarcinoma (HCC)  02/10/2021 Imaging   Pelvic ultrasound: thickened endometrium with slight vascularity at the fundus.  The lining measured 13.5 mm.     02/16/2021 Initial Biopsy   EMB: [facility]   03/17/2021 Surgery   Robotic-assisted laparoscopic total hysterectomy with bilateral salpingoophorectomy, SLN biopsy   Findings: On EUA, small mobile uterus. Normal upper abdominal survey on intra-abdominal entry. Normal omentum, small and large bowel. 6-8 cm uterus, normal appearing. Normal bilateral adnexa. Mapping successful to bilateral SLNs. No intra-abdominal or pelvic evidence of disease.   03/17/2021 Pathology Results   A. SENTINEL LYMPH NODE, RIGHT EXTERNAL ILIAC, EXCISION:  - One lymph node negative for metastatic carcinoma (0/1).   B. SENTINEL LYMPH NODE, LEFT OBTURATOR, EXCISION:  - One lymph node negative for metastatic carcinoma (0/1).   C. 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      Focal superficial myometrial invasion.      No lymphovascular invasion.      Cervix, bilateral ovaries and bilateral fallopian tubes negative for  carcinoma.   ONCOLOGY TABLE:  UTERUS, CARCINOMA OR CARCINOSARCOMA: Resection  Procedure: Total hysterectomy, bilateral salpingo-oophorectomy with  right and left sentinel lymph nodes.  Histologic Type: Endometrioid adenocarcinoma.  Histologic Grade: FIGO grade 1.  Myometrial Invasion:       Depth of Myometrial  Invasion: 2 mm.       Myometrial Thickness (mm): 25.       Percentage of Myometrial Invasion: 8%.  Uterine Serosa Involvement: Not identified.  Cervical stromal Involvement: Not  identified.  Extent of involvement of other tissue/organs: Not identified.  Lymphovascular Invasion: Not identified.  Regional Lymph Nodes:       Pelvic Lymph Nodes Examined: 2                                      Sentinel: 2                                      Non-sentinel: 0                                      Total: 2       Lymph Nodes with Metastasis: 0  Pathologic Stage Classification (pTNM, AJCC 8th Edition): pT1a, pN0  Ancillary Studies: MMR and MSI testing have been ordered.  Representative Tumor Block: C3-C9.  Comment(s): Cytokeratin AE1/AE3 performed on the sentinel lymph nodes  (parts A and B) is negative for metastatic carcinoma.  (v4.2.0.1)    03/20/2021 Initial Diagnosis   Endometrial adenocarcinoma (HCC)   Multiple myeloma not having achieved remission (HCC)  06/10/2024 Initial Diagnosis   Multiple myeloma not having achieved remission (HCC)   06/22/2024 -  Chemotherapy   Patient is on Treatment Plan : MYELOMA NEWLY DIAGNOSED TRANSPLANT CANDIDATE DaraVRd (Daratumumab  SQ) q21d x 6 Cycles (Induction/Consolidation)       INTERVAL HISTORY: Mary Potter is a 79 y.o. female who is here today for continued evaluation and management of her active multiple myeloma.   she was last seen by me on 10/15/2024; at the time she mentioned experiencing hair loss and bloating from her first few cycles of treatment.  Today, she reports that her rash has resolved completely. She is still holding Revlimid . She is not on any medication other than Pepcid  and Zyrtec. She reports her rash began between her second and third cycles of Revlimid . She denies mucosal involvement. She denies an enlarged lymph nodes, pain along the back, leg swelling, or new rashes.   She is otherwise feeling and doing well.   REVIEW OF SYSTEMS:   10 Point review of systems of done and is negative except as noted above.  MEDICAL HISTORY Past Medical History:  Diagnosis Date   Atrial fibrillation (HCC)     Dysrhythmia    A-fib   Endometrial hyperplasia 01/03/2015   GERD (gastroesophageal reflux disease)    History of kidney stones    Hyperlipidemia    Hypertension    Pneumonia    PONV (postoperative nausea and vomiting)    slow to wake up   Thickened endometrium 01/03/2015   Vaginal delivery 1976, 1985    SURGICAL HISTORY Past Surgical History:  Procedure Laterality Date   ABDOMINAL HYSTERECTOMY     ANKLE FRACTURE SURGERY     ATRIAL FIBRILLATION ABLATION N/A 02/08/2022   Procedure: ATRIAL FIBRILLATION ABLATION;  Surgeon: Cindie Ole DASEN, MD;  Location: [facility];  Service: Cardiovascular;  Laterality: N/A;   CARDIOVERSION N/A 02/17/2023   Procedure: CARDIOVERSION;  Surgeon: Sheena Pugh, DO;  Location: [facility] CV  [facility];  Service: Cardiovascular;  Laterality: N/A;   CATARACT EXTRACTION Right 2020   DILATION AND CURETTAGE OF UTERUS  2012   ablation    EYE SURGERY     Corneal growth removal on right eye   HYSTEROSCOPY WITH D & C N/A 01/03/2015   Procedure: DILATATION AND CURETTAGE /HYSTEROSCOPY;  Surgeon: Robbi Render, MD;  Location: [facility];  Service: Gynecology;  Laterality: N/A;   LAPAROSCOPY     for endometriosis   ROBOTIC ASSISTED TOTAL HYSTERECTOMY WITH BILATERAL SALPINGO OOPHERECTOMY N/A 03/17/2021   Procedure: XI ROBOTIC ASSISTED TOTAL HYSTERECTOMY WITH BILATERAL SALPINGO OOPHORECTOMY;  Surgeon: Viktoria Comer SAUNDERS, MD;  Location: [facility];  Service: Gynecology;  Laterality: N/A;   SENTINEL NODE BIOPSY N/A 03/17/2021   Procedure: SENTINEL NODE BIOPSY;  Surgeon: Viktoria Comer SAUNDERS, MD;  Location: [facility];  Service: Gynecology;  Laterality: N/A;   TONSILLECTOMY AND ADENOIDECTOMY      SOCIAL HISTORY Social History[1]  Social History   Social History Narrative   Lives with her younger daughter, Brittany. Her older daughter lives in [place] with her family.    SOCIAL DRIVERS OF HEALTH SDOH Screenings   Food Insecurity: No Food Insecurity (06/19/2024)   Housing: Low Risk (06/19/2024)  Transportation Needs: No Transportation Needs (06/19/2024)  Utilities: Not At Risk (06/19/2024)  Depression (PHQ2-9): Low Risk (09/14/2024)  Tobacco Use: Low Risk (05/14/2024)     FAMILY HISTORY Family History  Problem Relation Age of Onset   Heart disease Mother 71       CABG   Stroke Father    Prostate cancer Father    Heart disease Brother 63       Transplant, died 10 years   Heart disease Maternal Grandmother    Heart disease Paternal Grandmother    Cancer Paternal Grandfather    Heart disease Brother 17       RF   Leukemia Brother    Leukemia Brother    Hypertension Sister    Cervical cancer Sister    Colon cancer Neg Hx    Breast cancer Neg Hx    Ovarian cancer Neg Hx    Endometrial cancer Neg Hx    Pancreatic cancer Neg Hx      ALLERGIES: is allergic to talwin [pentazocine], benadryl  [diphenhydramine ], hydrocodone, and prednisone .  MEDICATIONS  Current Outpatient Medications  Medication Sig Dispense Refill   acetaminophen  (TYLENOL ) 500 MG tablet Take 1,000 mg by mouth every 6 (six) hours as needed (Arthritis Pain).     acyclovir  (ZOVIRAX ) 400 MG tablet Take 1 tablet (400 mg total) by mouth 2 (two) times daily. 60 tablet 5   ALPRAZolam (XANAX) 0.5 MG tablet Take 0.25 mg by mouth daily as needed (anxiousness).     apixaban  (ELIQUIS ) 5 MG TABS tablet Take 1 tablet (5 mg total) by mouth 2 (two) times daily. 180 tablet 3   b complex vitamins capsule Take 1 capsule by mouth daily.     Calcium  Carb-Cholecalciferol 600-3.125 MG-MCG TABS      Cholecalciferol (VITAMIN D ) 50 MCG (2000 UT) CAPS Take 2,000 Units by mouth at bedtime.     dexamethasone  (DECADRON ) 4 MG tablet Take 5 tabs (20 mg) weekly the day after daratumumab  for 12 weeks. Take with breakfast. (Patient not taking: Reported on 10/15/2024) 20 tablet 5   esomeprazole  (NEXIUM ) 40 MG capsule TAKE 1 CAPSULE BY MOUTH EVERY DAY BEFORE BREAKFAST 30 capsule 2   estradiol  (VIVELLE -DOT)  0.025 MG/24HR PLACE 1 PATCH ONTO THE SKIN 2 (TWO)  TIMES A WEEK. SUNDAYS & WEDNESDAYS 8 patch 0   lenalidomide  (REVLIMID ) 10 MG capsule Take 1 capsule (10 mg total) by mouth daily. Take 1 capsule (10 mg total) by mouth daily for 21 days. Take 7 days off. Repeat cycle. 21 capsule 0   metoprolol  tartrate (LOPRESSOR ) 50 MG tablet TAKE ONE TABLET BY MOUTH TWICE A DAY 180 tablet 3   olmesartan (BENICAR) 40 MG tablet Take 40 mg by mouth daily.     ondansetron  (ZOFRAN ) 8 MG tablet Take 1 tablet (8 mg total) by mouth every 8 (eight) hours as needed for nausea or vomiting. (Patient not taking: Reported on 10/15/2024) 30 tablet 1   Polyethyl Glycol-Propyl Glycol (SYSTANE OP) Place 1 drop into both eyes daily.     predniSONE  (DELTASONE ) 20 MG tablet Take 1 tablet (20 mg total) by mouth daily with breakfast. 10 tablet 0   prochlorperazine  (COMPAZINE ) 10 MG tablet Take 1 tablet (10 mg total) by mouth every 6 (six) hours as needed for nausea or vomiting. (Patient not taking: Reported on 10/15/2024) 30 tablet 1   rosuvastatin  (CRESTOR ) 20 MG tablet Take 1 tablet (20 mg total) by mouth every evening. 90 tablet 3   No current facility-administered medications for this visit.    PHYSICAL EXAMINATION: ECOG PERFORMANCE STATUS: 1 - Symptomatic but completely ambulatory VITALS: Vitals:   11/22/24 1321  BP: (!) 164/72  Pulse: 62  Resp: 18  Temp: 98.3 F (36.8 C)  SpO2: 99%   Filed Weights   11/22/24 1321  Weight: 162 lb 4.8 oz (73.6 kg)   Body mass index is 28.75 kg/m.  GENERAL: alert, in no acute distress and comfortable SKIN: no acute rashes, no significant lesions EYES: conjunctiva are pink and non-injected, sclera anicteric OROPHARYNX: MMM, no exudates, no oropharyngeal erythema or ulceration NECK: supple, no JVD LYMPH:  no palpable lymphadenopathy in the cervical, axillary or inguinal regions LUNGS: clear to auscultation b/l with normal respiratory effort HEART: regular rate & rhythm ABDOMEN:   normoactive bowel sounds , non tender, not distended, no hepatosplenomegaly Extremity: no pedal edema PSYCH: alert & oriented x 3 with fluent speech NEURO: no focal motor/sensory deficits  LABORATORY DATA:   I have reviewed the data as listed     Latest Ref Rng & Units 10/15/2024    1:08 PM 09/21/2024   10:49 AM 09/14/2024   10:03 AM  CBC EXTENDED  WBC 4.0 - 10.5 K/uL 4.8  6.5  6.9   RBC 3.87 - 5.11 MIL/uL 3.99  3.52  3.93   Hemoglobin 12.0 - 15.0 g/dL 88.1  89.5  88.1   HCT 36.0 - 46.0 % 36.0  32.3  35.9   Platelets 150 - 400 K/uL 198  162  183   NEUT# 1.7 - 7.7 K/uL 3.3  4.9  5.2   Lymph# 0.7 - 4.0 K/uL 0.7  1.0  1.2        Latest Ref Rng & Units 10/15/2024    1:08 PM 09/21/2024   10:49 AM 09/14/2024   10:03 AM  CMP  Glucose 70 - 99 mg/dL 92  881  899   BUN 8 - 23 mg/dL 12  23  20    Creatinine 0.44 - 1.00 mg/dL 9.14  9.18  9.15   Sodium 135 - 145 mmol/L 142  140  140   Potassium 3.5 - 5.1 mmol/L 3.4  4.5  4.3   Chloride 98 - 111 mmol/L 105  104  104   CO2 22 -  32 mmol/L 27  28  29    Calcium  8.9 - 10.3 mg/dL 9.2  9.4  9.4   Total Protein 6.5 - 8.1 g/dL 7.2  6.7  6.8   Total Bilirubin 0.0 - 1.2 mg/dL 0.5  0.4  0.5   Alkaline Phos 38 - 126 U/L 51  43  41   AST 15 - 41 U/L 27  21  17    ALT 0 - 44 U/L 25  17  14     Multiple Myeloma Panel 10/15/2024   Multiple Myeloma Panel Component     Latest Ref Rng 09/14/2024  IgG (Immunoglobin G), Serum     586 - 1,602 mg/dL 385   IgA     64 - 577 mg/dL 15 (L)   IgM (Immunoglobulin M), Srm     26 - 217 mg/dL 16 (L)   Total Protein ELP     6.0 - 8.5 g/dL 6.6 (C)  Albumin SerPl Elph-Mcnc     2.9 - 4.4 g/dL 3.6 (C)  Alpha 1     0.0 - 0.4 g/dL 0.3 (C)  Alpha2 Glob SerPl Elph-Mcnc     0.4 - 1.0 g/dL 1.1 (H) (C)  B-Globulin SerPl Elph-Mcnc     0.7 - 1.3 g/dL 1.2 (C)  Gamma Glob SerPl Elph-Mcnc     0.4 - 1.8 g/dL 0.5 (C)  M Protein SerPl Elph-Mcnc     Not Observed g/dL 0.2 (H) (C)  Globulin, Total     2.2 - 3.9 g/dL 3.0  (C)  Albumin/Glob SerPl     0.7 - 1.7  1.3 (C)  IFE 1 Comment ! (C)  Please Note (HCV): Comment (C)    Legend: (L) Low (H) High ! Abnormal (C) Corrected   Kappa/Lambda Light Chains Component     Latest Ref Rng 09/14/2024  Kappa free light chain     3.3 - 19.4 mg/L 4.8   Lambda free light chains     5.7 - 26.3 mg/L 2.4 (L)   Kappa, lambda light chain ratio     0.26 - 1.65  2.00 (H)     Legend: (L) Low (H) High      IFE/PE, serum 07/21/2023:  05/14/2024 Surgical Pathology   Bone Marrow Report    Clinical History: MYELOMA    DIAGNOSIS:    BONE MARROW, ASPIRATE, CLOT, CORE:  - Hypercellular bone marrow (60%) involved by plasma cell neoplasm (63%  plasma cells by manual aspirate differential, approximately 70% by CD138  immunohistochemical analysis, and kappa monotypic by kappa/lambda in  situ hybridization).  See comment.    PERIPHERAL BLOOD:  - Normocytic normochromic anemia    COMMENT:    Correlation with clinical findings, radiographic skeletal survey,  myeloma panel and other laboratory tests (SPEP, UPEP, beta  microglobulin, etc.) is recommended for a complete assessment and  classification of this case.     05/18/2024    RADIOGRAPHIC STUDIES: I have personally reviewed the radiological images as listed and agreed with the findings in the report.   10/03/2023 DG BONE DENSITY (DXA)   IMPRESSION: Referring Physician:  GLENDIA FREEMAN Your patient completed a bone mineral density test using GE Lunar iDXA system (analysis version: 16). Technologist: ALW PATIENT: Name: Mary Potter, Mary Potter Patient ID: [id number] Birth Date: 1946-10-17 Height: 63.0 in. Sex: Female Measured: 10/03/2023 Weight: 156.4 lbs. Indications: Advanced Age, Bilateral Ovariectomy, Caucasian, Estrogen Deficiency, Family Hist. (Parent hip fracture), Family History of Osteoporosis, History of Fracture (Adult), Hysterectomy, Nexium , Post Menopausal Fractures: Vertebra Treatments:  Estrogen Patch, Vitamin D , Prolia    ASSESSMENT: The BMD measured at Left Forearm Radius 33% is 0.684 g/cm2 with a T-score of -2.2. This patient is considered to have osteopenia/low bone mass according to World Health Organization [facility]) criteria.   The scan quality is good. Lumbar Spine excluded due to degenerative changes. No FRAX due to patient taking Prolia    Site Region Measured Date Measured Age YA BMD Significant CHANGE T-score DualFemur Neck Left 10/03/2023 76.7 -2.2 0.734 g/cm2   Left Forearm Radius 33% 10/03/2023 76.7 -2.2 0.684 g/cm2   DualFemur Total Mean 10/03/2023 76.7 -1.7 0.788 g/cm2   World Health Organization [facility]) criteria for post-menopausal, Caucasian Women: Normal       T-score at or above -1 SD Osteopenia   T-score between -1 and -2.5 SD Osteoporosis T-score at or below -2.5 SD   RECOMMENDATION: 1. All patients should optimize calcium  and vitamin D  intake. 2. Consider FDA approved medical therapies in postmenopausal women and men aged 74 years and older, based on the following: a. A hip or vertebral (clinical or morphometric) fracture b. T-score = -2.5 at the femoral neck or spine after appropriate evaluation to exclude secondary causes c. Low bone mass (T-score between -1.0 and -2.5 at the femoral neck or spine) and a 10- year probability of a hip fracture = 3% or a 10 year probability of a major osteoporosis-related fracture = 20% based on the [place] -adapted WHO algorithm. 3. Clinician judgement and/or patient preference may indicate treatment for people with10-year fracture probabilities above or below these levels.   FOLLOW-UP: Patients with diagnosis of osteoporosis or at high risk for fracture should have regular bone mineral density tests . For patients eligible for Medicare routine testing is allowed once every 2 years. The testing frequency can be increased to one year for patients who have rapidly progressing disease, those who are receiving  or discontinuing medical therapy to restore bone mass, or have additional risk factors.   I have reviewed this study and agree with the findings. [facility]   Electronically Signed   By: Norman Hopper M.D.   On: 10/03/2023 13:06    04/30/2024 NM PET Image Initial (PI) Skull Base To Thigh  CLINICAL DATA:  Initial treatment strategy for myeloma.    TECHNIQUE: 8.09 mCi F-18 FDG was injected intravenously. Full-ring PET imaging was performed from the skull base to thigh after the radiotracer. CT data was obtained and used for attenuation correction and anatomic localization.   Fasting blood glucose: 116 mg/dl   COMPARISON:  Lumbar spine CT 08/19/2023. Abdomen pelvis CT with contrast 09/12/2020. skeletal bone survey 08/15/2023.   FINDINGS: Mediastinal blood pool activity: SUV max 2.7   Liver activity: SUV max 3.1   NECK: No specific abnormal uptake identified in the neck including along lymph node change of the submandibular, posterior triangle or internal jugular region. Near symmetric uptake of the visualized intracranial compartment.   Incidental CT findings: The parotid glands, submandibular glands and thyroid  gland are unremarkable. Significant streak artifact related to the patient's dental hardware. Paranasal sinuses and mastoid air cells are clear. Right middle turbinate concha bullosa.   CHEST: No specific abnormal uptake identified above blood pool in the axillary regions, hilum or mediastinum. No abnormal lung parenchymal uptake.   Incidental CT findings: Small hiatal hernia. Slightly patulous thoracic esophagus. No pericardial effusion. Scattered vascular calcifications are seen including along the coronary arteries. The thoracic aorta has a normal course and caliber. Scattered calcified plaque. Breathing motion seen throughout  the examination with some mild scattered ground-glass identified along the lower lung zones, nonspecific. No  consolidation, pneumothorax or effusion. There is some middle lobe calcified nodules identified on image 72 of the CT scan, series 4.   ABDOMEN/PELVIS: There is physiologic distribution radiotracer along the parenchymal organs, bowel and renal collecting systems. Specifically no abnormal uptake along the spleen. Splenic length approaches 10.4 cm. No abnormal uptake along lymph node chains in the abdomen and pelvis.   Incidental CT findings: On this limited noncontrast examination, the liver, adrenal glands unremarkable. There is some fatty atrophy of the pancreas. Stone in the gallbladder. No abnormal calcifications seen within either kidney nor along the course of either ureter. Underdistended urinary bladder. Large bowel has a normal course and caliber. Left-sided colonic diverticula. Scattered colonic stool. Stomach and small bowel are nondilated.   SKELETON: There is some mild uptake seen in the spine in the area of compression deformity non additions cement at T12 through L2. There is some minimal uptake along the proximal right humeral metaphyseal shaft with maximum SUV of 3.0. No erosive changes along the bone in this location. Nonspecific. No additional areas of abnormal uptake along the visualized osseous structures.   Incidental CT findings: Diffuse degenerative changes identified.   IMPRESSION: No specific abnormal radiotracer uptake identified. This includes along lymph node chains, spleen. No splenomegaly.   Foley slight asymmetric uptake along the proximal right humeral shaft of maximum SUV of 3.0, nonspecific. This is in a different location than the lucent lesion seen on the bone survey October 2024.   Augmentation cement seen at the spine at T12 through L2. Colonic diverticula. Gallstone. Hiatal hernia.     Electronically Signed   By: Ranell Bring M.D.   On: 05/01/2024 16:57      RADIOGRAPHIC STUDIES: I have personally reviewed the radiological images  as listed and agreed with the findings in the report. No results found.  ASSESSMENT & PLAN:  79 y.o. 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 PET/CT 04/30/2024 nonspecific right humeral shaft uptake.  No definitive bone lesions. Bone marrow biopsy shows 70% kappa restricted plasma cells consistent with active myeloma based on the presence of more than 60% involvement with plasma cell neoplasm and also based on light chain criteria. - Molecular cytogenetics show 13 q. deletion suggesting standard risk myeloma. Pretreatment serology show IgA level of 1886 and an M spike of 1 g/dL Kappa free light chains of 265 with a kappa lambda ratio of 106 --Started Dara/Velcade /Dex on 06/22/2024   2. History of A-fib and is on anticoagulation.  No previous history of MI or CVA or VTE.   3. History of hiatal hernia  4. Generalized Pruritic rash . No mucocutaneous involvement. Now resolved. Saw dermatology who though it was likely Viral Exanthem. There are concerns it could be related to  PLAN: Patient was seen earlier than scheduled appointment due to concerns of a diffuse rash which has now resolved. -dermatology opined that it could be a viral exanthem though Revlimid  as an etiology needs to be considered. -will prescribe a low dose of prednisone  prophylaxis 10mg  then 5mg  for one month alongside H1RB and H2RB to reduce risk of rash with restarting  -will resume Revlimid  treatments unless SOB or wheezing and significant rashes arise. -RTC with Dr Onesimo with labs and appointment for Xgeva  in 1 month  FOLLOW-UP in 4 weeks for labs and follow-up with Dr. Onesimo.  The total time spent in the appointment was 30  minutes* .  All of the patient's questions were answered and the patient knows to call the clinic with any problems, questions, or concerns.  Emaline Saran MD MS AAHIVMS [facility] Hematology/Oncology Physician [facility]  *Total Encounter Time as defined by the  Centers for Medicare and Medicaid Services includes, in addition to the face-to-face time of a patient visit (documented in the note above) non-face-to-face time: obtaining and reviewing outside history, ordering and reviewing medications, tests or procedures, care coordination (communications with other health care professionals or caregivers) and documentation in the medical record.  I, Alan Blowers, acting as a neurosurgeon for Emaline Saran, MD.,have documented all relevant documentation on the behalf of Emaline Saran, MD,as directed by  Emaline Saran, MD while in the presence of Emaline Saran, MD.  I have reviewed the above documentation for accuracy and completeness, and I agree with the above.  Emaline Saran, MD     [1]  Social History Tobacco Use   Smoking status: Never    Passive exposure: Past   Smokeless tobacco: Never   Tobacco comments:    Never smoke 03/02/23  Vaping Use   Vaping status: Never Used  Substance Use Topics   Alcohol  use: No    Alcohol /week: 0.0 standard drinks of alcohol    Drug use: No   "

## 2024-11-23 ENCOUNTER — Emergency Department (HOSPITAL_BASED_OUTPATIENT_CLINIC_OR_DEPARTMENT_OTHER)
Admission: EM | Admit: 2024-11-23 | Discharge: 2024-11-23 | Disposition: A | Discharge status: Home / Self Care | Attending: Emergency Medicine | Admitting: Emergency Medicine

## 2024-11-23 ENCOUNTER — Emergency Department (HOSPITAL_BASED_OUTPATIENT_CLINIC_OR_DEPARTMENT_OTHER)

## 2024-11-23 ENCOUNTER — Encounter: Payer: Self-pay | Admitting: Hematology

## 2024-11-23 ENCOUNTER — Other Ambulatory Visit: Payer: Self-pay

## 2024-11-23 ENCOUNTER — Encounter (HOSPITAL_BASED_OUTPATIENT_CLINIC_OR_DEPARTMENT_OTHER): Payer: Self-pay

## 2024-11-23 ENCOUNTER — Emergency Department (HOSPITAL_BASED_OUTPATIENT_CLINIC_OR_DEPARTMENT_OTHER): Admitting: Radiology

## 2024-11-23 DIAGNOSIS — M25562 Pain in left knee: Secondary | ICD-10-CM | POA: Insufficient documentation

## 2024-11-23 DIAGNOSIS — I482 Chronic atrial fibrillation, unspecified: Secondary | ICD-10-CM | POA: Diagnosis not present

## 2024-11-23 DIAGNOSIS — Z7901 Long term (current) use of anticoagulants: Secondary | ICD-10-CM | POA: Insufficient documentation

## 2024-11-23 LAB — BASIC METABOLIC PANEL WITH GFR
Anion gap: 11 (ref 5–15)
BUN: 23 mg/dL (ref 8–23)
CO2: 28 mmol/L (ref 22–32)
Calcium: 10.2 mg/dL (ref 8.9–10.3)
Chloride: 103 mmol/L (ref 98–111)
Creatinine, Ser: 0.68 mg/dL (ref 0.44–1.00)
GFR, Estimated: >60 mL/min
Glucose, Bld: 119 mg/dL — ABNORMAL HIGH (ref 70–99)
Potassium: 4.4 mmol/L (ref 3.5–5.1)
Sodium: 141 mmol/L (ref 135–145)

## 2024-11-23 LAB — CBC WITH DIFFERENTIAL/PLATELET
Abs Immature Granulocytes: 0.01 K/uL (ref 0.00–0.07)
Basophils Absolute: 0.1 K/uL (ref 0.0–0.1)
Basophils Relative: 1 %
Eosinophils Absolute: 0.3 K/uL (ref 0.0–0.5)
Eosinophils Relative: 5 %
HCT: 36.7 % (ref 36.0–46.0)
Hemoglobin: 11.9 g/dL — ABNORMAL LOW (ref 12.0–15.0)
Immature Granulocytes: 0 %
Lymphocytes Relative: 25 %
Lymphs Abs: 1.6 K/uL (ref 0.7–4.0)
MCH: 27.7 pg (ref 26.0–34.0)
MCHC: 32.4 g/dL (ref 30.0–36.0)
MCV: 85.5 fL (ref 80.0–100.0)
Monocytes Absolute: 0.7 K/uL (ref 0.1–1.0)
Monocytes Relative: 11 %
Neutro Abs: 3.9 K/uL (ref 1.7–7.7)
Neutrophils Relative %: 58 %
Platelets: 288 K/uL (ref 150–400)
RBC: 4.29 MIL/uL (ref 3.87–5.11)
RDW: 14.4 % (ref 11.5–15.5)
WBC: 6.7 K/uL (ref 4.0–10.5)
nRBC: 0 % (ref 0.0–0.2)

## 2024-11-23 LAB — CK: Total CK: 19 U/L — ABNORMAL LOW (ref 38–234)

## 2024-11-23 MED ORDER — OXYCODONE-ACETAMINOPHEN 5-325 MG PO TABS: 1.0000 | ORAL_TABLET | Freq: Four times a day (QID) | ORAL | 0 refills | Status: AC | PRN

## 2024-11-23 MED ORDER — OXYCODONE-ACETAMINOPHEN 5-325 MG PO TABS
1.0000 | ORAL_TABLET | Freq: Once | ORAL | Status: AC
  Administered 2024-11-23: 1 via ORAL
  Filled 2024-11-23: qty 1

## 2024-11-23 NOTE — ED Notes (Signed)
 Pt d/c instructions, medications, and follow-up care reviewed with pt. Pt verbalized understanding and had no further questions at time of d/c. Pt CA&Ox4, ambulatory, and in NAD at time of d/c. Pt discharged with family.

## 2024-11-23 NOTE — ED Triage Notes (Signed)
 Arrives POV. Reports chills and aching in her legs. Same happened last week in addition to a rash that she's being treated for. Was told it was a viral rash. Also had elevated BPs at home. Last tylenol  at 0300.

## 2024-11-23 NOTE — ED Provider Notes (Signed)
 "  [place] EMERGENCY DEPARTMENT AT [facility]  Provider Note  CSN: [id number] Arrival date & time: 11/23/24 9396  History Chief Complaint  Patient presents with   Knee Pain    Mary Potter is a 79 y.o. female with relatively recent diagnosis of multiple myeloma has completed induction therapy and is currently in maintenance phase. She reports she recently had some L knee and lower leg pain and then developed an itchy rash that was ultimately determined to be a viral exanthem and has mostly resolved. She was at Oncology office yesterday for recheck with plan to start back on maintenance therapy with Rx for low dose prednisone  and antihistamines to take with it in case that was the cause of her rash. She has not yet started the medication but reports aching pain in her L knee returned last night. Pain is not in her back or thigh, but in her knee and then down her leg. She has not had any swelling and is already on Eliquis  for Afib. She is on Crestor  for HLD. She denies any falls or injuries. She has not had a myeloma lesion in her leg as far as she is aware.    Home Medications Prior to Admission medications  Medication Sig Start Date End Date Taking? Authorizing Provider  acetaminophen  (TYLENOL ) 500 MG tablet Take 1,000 mg by mouth every 6 (six) hours as needed (Arthritis Pain).    [provider]  acyclovir  (ZOVIRAX ) 400 MG tablet Take 1 tablet (400 mg total) by mouth 2 (two) times daily. 06/10/24   Onesimo Emaline Brink, MD  ALPRAZolam (XANAX) 0.5 MG tablet Take 0.25 mg by mouth daily as needed (anxiousness). 08/26/21   [provider]  apixaban  (ELIQUIS ) 5 MG TABS tablet Take 1 tablet (5 mg total) by mouth 2 (two) times daily. 03/02/23   Terra Fairy PARAS, PA-C  b complex vitamins capsule Take 1 capsule by mouth daily.    [provider]  Calcium  Carb-Cholecalciferol 600-3.125 MG-MCG TABS  09/16/23   [provider]  Cholecalciferol (VITAMIN  D) 50 MCG (2000 UT) CAPS Take 2,000 Units by mouth at bedtime.    [provider]  dexamethasone  (DECADRON ) 4 MG tablet Take 5 tabs (20 mg) weekly the day after daratumumab  for 12 weeks. Take with breakfast. Patient not taking: Reported on 10/15/2024 07/27/24   Onesimo Emaline Brink, MD  esomeprazole  (NEXIUM ) 40 MG capsule TAKE 1 CAPSULE BY MOUTH EVERY DAY BEFORE BREAKFAST 10/08/24   Onesimo Emaline Brink, MD  estradiol  (VIVELLE -DOT) 0.025 MG/24HR PLACE 1 PATCH ONTO THE SKIN 2 (TWO) TIMES A WEEK. SUNDAYS & [facility] [place] 03/08/22   Cross, Melissa D, NP  lenalidomide  (REVLIMID ) 10 MG capsule Take 1 capsule (10 mg total) by mouth daily. Take 1 capsule (10 mg total) by mouth daily for 21 days. Take 7 days off. Repeat cycle. 11/09/24   Onesimo Emaline Brink, MD  metoprolol  tartrate (LOPRESSOR ) 50 MG tablet TAKE ONE TABLET BY MOUTH TWICE A DAY 05/23/23   Lavona Agent, MD  olmesartan (BENICAR) 40 MG tablet Take 40 mg by mouth daily.    [provider]  ondansetron  (ZOFRAN ) 8 MG tablet Take 1 tablet (8 mg total) by mouth every 8 (eight) hours as needed for nausea or vomiting. Patient not taking: Reported on 10/15/2024 06/10/24   Kale, Gautam Kishore, MD  Polyethyl Glycol-Propyl Glycol (SYSTANE OP) Place 1 drop into both eyes daily.    [provider]  predniSONE  (DELTASONE ) 5 MG tablet Take 2  tablets (10 mg total) by mouth daily with breakfast for 10 days, THEN 1 tablet (5 mg total) daily with breakfast for 20 days. 11/22/24 12/22/24  Onesimo Emaline Brink, MD  prochlorperazine  (COMPAZINE ) 10 MG tablet Take 1 tablet (10 mg total) by mouth every 6 (six) hours as needed for nausea or vomiting. Patient not taking: Reported on 10/15/2024 06/10/24   Onesimo Emaline Brink, MD  rosuvastatin  (CRESTOR ) 20 MG tablet Take 1 tablet (20 mg total) by mouth every evening. 05/17/22   Cindie Ole DASEN, MD     Allergies    Talwin [pentazocine], Benadryl  [diphenhydramine ], Hydrocodone, and Prednisone    Review  of Systems   Review of Systems Please see HPI for pertinent positives and negatives  Physical Exam BP (!) 172/104 (BP Location: Right Arm)   Pulse 73   Temp 98 F (36.7 C) (Oral)   Resp 18   SpO2 96%   Physical Exam Vitals and nursing note reviewed.  Constitutional:      Appearance: Normal appearance.  HENT:     Head: Normocephalic and atraumatic.     Nose: Nose normal.     Mouth/Throat:     Mouth: Mucous membranes are moist.  Eyes:     Extraocular Movements: Extraocular movements intact.     Conjunctiva/sclera: Conjunctivae normal.  Cardiovascular:     Rate and Rhythm: Normal rate.  Pulmonary:     Effort: Pulmonary effort is normal.     Breath sounds: Normal breath sounds.  Abdominal:     General: Abdomen is flat.     Palpations: Abdomen is soft.     Tenderness: There is no abdominal tenderness.  Musculoskeletal:        General: Tenderness (mild, L knee) present. No swelling or deformity. Normal range of motion.     Cervical back: Neck supple.     Right lower leg: No edema.     Left lower leg: No edema.     Comments: No joint effusion, no tenderness in lower back or sciatic notch  Skin:    General: Skin is warm and dry.  Neurological:     General: No focal deficit present.     Mental Status: She is alert.  Psychiatric:        Mood and Affect: Mood normal.     ED Results / Procedures / Treatments   EKG None  Procedures Procedures  Medications Ordered in the ED Medications  oxyCODONE -acetaminophen  (PERCOCET/ROXICET) 5-325 MG per tablet 1 tablet (1 tablet Oral Given 11/23/24 9366)    Initial Impression and Plan  Patient here with L knee pain in setting of active myeloma, low concern for DVT given no swelling and she is on Eliquis  but will check US  to be sure. She has not had labs done recently, so will check those as well as CK for statin induced rhabdo. Pain medication for comfort.   ED Course   Clinical Course as of 11/23/24 0659  [place] Nov 23, 2024   0653 CBC is normal.  [CS]  [phone] Care signed out at shift change to oncoming team.  [CS]    Clinical Course User Index [CS] Roselyn Carlin NOVAK, MD     MDM Rules/Calculators/A&P Medical Decision Making Problems Addressed: Acute pain of left knee: acute illness or injury  Amount and/or Complexity of Data Reviewed Labs: ordered. Radiology: ordered.  Risk Prescription drug management.     Final Clinical Impression(s) / ED Diagnoses Final diagnoses:  Acute pain of left knee    Rx /  DC Orders ED Discharge Orders     None        Roselyn Carlin NOVAK, MD 11/23/24 [phone]  "

## 2024-11-29 ENCOUNTER — Encounter: Payer: Self-pay | Admitting: Hematology

## 2024-12-04 ENCOUNTER — Other Ambulatory Visit: Payer: Self-pay | Admitting: Hematology

## 2024-12-04 DIAGNOSIS — C9000 Multiple myeloma not having achieved remission: Secondary | ICD-10-CM

## 2024-12-04 DIAGNOSIS — Z7189 Other specified counseling: Secondary | ICD-10-CM

## 2024-12-11 ENCOUNTER — Inpatient Hospital Stay: Attending: Hematology

## 2024-12-19 ENCOUNTER — Inpatient Hospital Stay: Admitting: Hematology

## 2024-12-19 ENCOUNTER — Inpatient Hospital Stay

## 2025-04-30 ENCOUNTER — Ambulatory Visit: Admitting: Gynecologic Oncology
